# Patient Record
Sex: Male | Born: 1945
Health system: Southern US, Community
[De-identification: ages and names within clinical notes are randomized; demographics above are authoritative.]

## PROBLEM LIST (undated history)

## (undated) DIAGNOSIS — J449 Chronic obstructive pulmonary disease, unspecified: Secondary | ICD-10-CM

## (undated) DIAGNOSIS — I639 Cerebral infarction, unspecified: Secondary | ICD-10-CM

## (undated) DIAGNOSIS — E785 Hyperlipidemia, unspecified: Secondary | ICD-10-CM

## (undated) DIAGNOSIS — R319 Hematuria, unspecified: Secondary | ICD-10-CM

## (undated) HISTORY — DX: Cerebral infarction, unspecified: I63.9

## (undated) HISTORY — DX: Hematuria, unspecified: R31.9

## (undated) HISTORY — DX: Chronic obstructive pulmonary disease, unspecified: J44.9

## (undated) HISTORY — DX: Hyperlipidemia, unspecified: E78.5

---

## 2008-03-27 ENCOUNTER — Ambulatory Visit: Payer: Self-pay | Admitting: Internal Medicine

## 2009-11-17 HISTORY — PX: COLONOSCOPY: SHX174

## 2010-11-17 LAB — HM COLONOSCOPY: HM Colonoscopy: NORMAL

## 2014-06-14 LAB — CBC AND DIFFERENTIAL: Hemoglobin: 15.6 g/dL (ref 13.5–17.5)

## 2014-06-14 LAB — BASIC METABOLIC PANEL
BUN: 12 mg/dL (ref 4–21)
Creatinine: 1.1 mg/dL (ref <=1.3)
Glucose: 92 mg/dL

## 2014-06-14 LAB — HEPATIC FUNCTION PANEL
ALT: 15 U/L (ref 10–40)
AST: 16 U/L (ref 14–40)

## 2014-06-14 LAB — PSA: PSA: 1

## 2014-06-21 ENCOUNTER — Ambulatory Visit: Payer: Self-pay | Admitting: Family Medicine

## 2015-02-16 ENCOUNTER — Ambulatory Visit
Admit: 2015-02-16 | Disposition: A | Discharge status: Home / Self Care | Payer: Self-pay | Attending: Family Medicine | Admitting: Family Medicine

## 2015-04-03 DIAGNOSIS — I639 Cerebral infarction, unspecified: Secondary | ICD-10-CM | POA: Insufficient documentation

## 2015-04-03 DIAGNOSIS — J432 Centrilobular emphysema: Secondary | ICD-10-CM | POA: Insufficient documentation

## 2015-04-03 DIAGNOSIS — F1020 Alcohol dependence, uncomplicated: Secondary | ICD-10-CM | POA: Insufficient documentation

## 2015-04-03 DIAGNOSIS — Z91199 Patient's noncompliance with other medical treatment and regimen due to unspecified reason: Secondary | ICD-10-CM | POA: Insufficient documentation

## 2015-04-03 DIAGNOSIS — R911 Solitary pulmonary nodule: Secondary | ICD-10-CM | POA: Insufficient documentation

## 2015-04-03 DIAGNOSIS — D649 Anemia, unspecified: Secondary | ICD-10-CM | POA: Insufficient documentation

## 2015-04-03 DIAGNOSIS — R918 Other nonspecific abnormal finding of lung field: Secondary | ICD-10-CM | POA: Insufficient documentation

## 2015-04-03 DIAGNOSIS — E785 Hyperlipidemia, unspecified: Secondary | ICD-10-CM | POA: Insufficient documentation

## 2015-04-03 DIAGNOSIS — Z9119 Patient's noncompliance with other medical treatment and regimen: Secondary | ICD-10-CM | POA: Insufficient documentation

## 2015-04-03 DIAGNOSIS — R739 Hyperglycemia, unspecified: Secondary | ICD-10-CM | POA: Insufficient documentation

## 2015-07-18 ENCOUNTER — Encounter: Payer: Self-pay | Admitting: Family Medicine

## 2015-07-18 ENCOUNTER — Ambulatory Visit (INDEPENDENT_AMBULATORY_CARE_PROVIDER_SITE_OTHER): Payer: PPO | Admitting: Family Medicine

## 2015-07-18 VITALS — BP 120/80 | HR 70 | Ht 73.0 in | Wt 125.0 lb

## 2015-07-18 DIAGNOSIS — R5381 Other malaise: Secondary | ICD-10-CM

## 2015-07-18 DIAGNOSIS — F17210 Nicotine dependence, cigarettes, uncomplicated: Secondary | ICD-10-CM | POA: Diagnosis not present

## 2015-07-18 DIAGNOSIS — R634 Abnormal weight loss: Secondary | ICD-10-CM

## 2015-07-18 DIAGNOSIS — F1099 Alcohol use, unspecified with unspecified alcohol-induced disorder: Secondary | ICD-10-CM | POA: Diagnosis not present

## 2015-07-18 DIAGNOSIS — J439 Emphysema, unspecified: Secondary | ICD-10-CM | POA: Diagnosis not present

## 2015-07-18 DIAGNOSIS — R5383 Other fatigue: Secondary | ICD-10-CM

## 2015-07-18 DIAGNOSIS — R0609 Other forms of dyspnea: Secondary | ICD-10-CM | POA: Diagnosis not present

## 2015-07-18 DIAGNOSIS — R918 Other nonspecific abnormal finding of lung field: Secondary | ICD-10-CM

## 2015-07-18 DIAGNOSIS — Z7289 Other problems related to lifestyle: Secondary | ICD-10-CM

## 2015-07-18 DIAGNOSIS — F1090 Alcohol use, unspecified, uncomplicated: Secondary | ICD-10-CM

## 2015-07-18 DIAGNOSIS — Z789 Other specified health status: Secondary | ICD-10-CM

## 2015-07-18 LAB — HEMOCCULT GUIAC POC 1CARD (OFFICE): Fecal Occult Blood, POC: NEGATIVE

## 2015-07-18 NOTE — Progress Notes (Signed)
Name: Nathaniel Garcia   MRN: [id number]    DOB: 06-23-1946   Date:07/18/2015       Progress Note  Subjective  Chief Complaint  Chief Complaint  Patient presents with  . Fatigue    on exertion- carrying boxes, walking a "good distance"    Shortness of Breath This is a recurrent problem. The current episode started more than 1 month ago. The problem occurs daily. The problem has been unchanged. Pertinent negatives include no abdominal pain, chest pain, claudication, coryza, ear pain, fever, headaches, hemoptysis, leg pain, leg swelling, neck pain, orthopnea, PND, rash, rhinorrhea, sore throat, sputum production, swollen glands, syncope, vomiting or wheezing. The symptoms are aggravated by any activity (smoking). Associated symptoms comments: No dysphagia/. Risk factors include smoking. The treatment provided moderate relief. His past medical history is significant for chronic lung disease and COPD. There is no history of allergies, aspirin allergies, asthma, bronchiolitis, CAD, DVT, a heart failure, PE, pneumonia or a recent surgery.    No problem-specific assessment & plan notes found for this encounter.   Past Medical History  Diagnosis Date  . COPD (chronic obstructive pulmonary disease)     Past Surgical History  Procedure Laterality Date  . Colonoscopy  2011    normal- [facility]    History reviewed. No pertinent family history.  Social History   Social History  . Marital Status: Married    Spouse Name: N/A  . Number of Children: N/A  . Years of Education: N/A   Occupational History  . Not on file.   Social History Main Topics  . Smoking status: Current Every Day Smoker  . Smokeless tobacco: Not on file  . Alcohol Use: 0.0 oz/week    0 Standard drinks or equivalent per week  . Drug Use: Yes     Comment: marijuana  . Sexual Activity: No   Other Topics Concern  . Not on file   Social History Narrative    No Known Allergies   Review of Systems   Constitutional: Positive for weight loss and malaise/fatigue. Negative for fever, chills and diaphoresis.  HENT: Negative for congestion, ear discharge, ear pain, hearing loss, nosebleeds, rhinorrhea, sore throat and tinnitus.   Eyes: Negative for blurred vision, double vision, photophobia, pain, discharge and redness.  Respiratory: Positive for cough and shortness of breath. Negative for hemoptysis, sputum production, wheezing and stridor.   Cardiovascular: Negative for chest pain, orthopnea, claudication, leg swelling, syncope and PND.  Gastrointestinal: Negative for heartburn, nausea, vomiting, abdominal pain, diarrhea, constipation, blood in stool and melena.  Genitourinary: Negative for dysuria, urgency, frequency, hematuria and flank pain.  Musculoskeletal: Negative for myalgias, back pain, joint pain, falls and neck pain.  Skin: Negative for itching and rash.  Neurological: Negative for dizziness, tingling, tremors, focal weakness, weakness and headaches.  Endo/Heme/Allergies: Negative for polydipsia. Does not bruise/bleed easily.  Psychiatric/Behavioral: Negative for depression and substance abuse. The patient has insomnia.      Objective  Filed Vitals:   07/18/15 0938  BP: 120/80  Pulse: 70  Height: 6\' 1"  (1.854 m)  Weight: 125 lb (56.7 kg)    Physical Exam  Constitutional: He is oriented to person, place, and time and well-developed, well-nourished, and in no distress.  HENT:  Head: Normocephalic.  Right Ear: External ear normal.  Left Ear: External ear normal.  Nose: Nose normal.  Mouth/Throat: Oropharynx is clear and moist.  Eyes: Conjunctivae and EOM are normal. Pupils are equal, round, and reactive to light. Right  eye exhibits no discharge. Left eye exhibits no discharge. No scleral icterus.  Neck: Normal range of motion. Neck supple. No JVD present. No tracheal deviation present. No thyromegaly present.  Cardiovascular: Normal rate, regular rhythm, normal heart  sounds and intact distal pulses.  Exam reveals no gallop and no friction rub.   No murmur heard. Pulmonary/Chest: Breath sounds normal. No respiratory distress. He has no wheezes. He has no rales.  Abdominal: Soft. Bowel sounds are normal. He exhibits no mass. There is no hepatosplenomegaly. There is no tenderness. There is no rebound, no guarding and no CVA tenderness.  Genitourinary: Rectum normal and prostate normal. Guaiac negative stool.  Musculoskeletal: Normal range of motion. He exhibits no edema or tenderness.  Lymphadenopathy:    He has no cervical adenopathy.  Neurological: He is alert and oriented to person, place, and time. He has normal sensation, normal strength, normal reflexes and intact cranial nerves. No cranial nerve deficit.  Skin: Skin is warm. No rash noted.  Psychiatric: Mood and affect normal.      Assessment & Plan  Problem List Items Addressed This Visit      Other   Lung nodule, multiple   Relevant Orders   CT CHEST NODULE FOLLOW UP LOW DOSE W/O    Other Visit Diagnoses    Dyspnea on exertion    -  Primary    Relevant Orders    EKG 12-Lead (Completed)    Ambulatory referral to Pulmonology    Ambulatory referral to Cardiology    Chronic bullous emphysema        Relevant Orders    Ambulatory referral to Pulmonology    Weight loss, non-intentional        Relevant Orders    CT Abdomen Pelvis W Contrast    POCT Occult Blood Stool (Completed)    Malaise and fatigue        Relevant Orders    CT Abdomen Pelvis W Contrast    Cigarette nicotine dependence without complication        Alcohol use             Dr. Elizabeth Sauer [facility]  07/18/2015

## 2015-07-30 ENCOUNTER — Ambulatory Visit
Admission: RE | Admit: 2015-07-30 | Discharge: 2015-07-30 | Disposition: A | Discharge status: Home / Self Care | Payer: PPO | Source: Ambulatory Visit | Attending: Family Medicine | Admitting: Family Medicine

## 2015-07-30 ENCOUNTER — Other Ambulatory Visit
Admission: RE | Admit: 2015-07-30 | Discharge: 2015-07-30 | Disposition: A | Discharge status: Home / Self Care | Payer: PPO | Source: Ambulatory Visit | Attending: Family Medicine | Admitting: Family Medicine

## 2015-07-30 DIAGNOSIS — R918 Other nonspecific abnormal finding of lung field: Secondary | ICD-10-CM | POA: Insufficient documentation

## 2015-07-30 DIAGNOSIS — R5381 Other malaise: Secondary | ICD-10-CM | POA: Diagnosis present

## 2015-07-30 DIAGNOSIS — R5383 Other fatigue: Secondary | ICD-10-CM | POA: Insufficient documentation

## 2015-07-30 DIAGNOSIS — F17210 Nicotine dependence, cigarettes, uncomplicated: Secondary | ICD-10-CM | POA: Diagnosis not present

## 2015-07-30 DIAGNOSIS — R634 Abnormal weight loss: Secondary | ICD-10-CM | POA: Diagnosis present

## 2015-07-30 DIAGNOSIS — K5730 Diverticulosis of large intestine without perforation or abscess without bleeding: Secondary | ICD-10-CM | POA: Insufficient documentation

## 2015-07-30 DIAGNOSIS — I708 Atherosclerosis of other arteries: Secondary | ICD-10-CM | POA: Diagnosis not present

## 2015-07-30 DIAGNOSIS — R0681 Apnea, not elsewhere classified: Secondary | ICD-10-CM | POA: Insufficient documentation

## 2015-07-30 LAB — CREATININE, SERUM
Creatinine, Ser: 1.09 mg/dL (ref 0.61–1.24)
GFR calc Af Amer: >60 mL/min (ref >60)

## 2015-07-30 MED ORDER — IOHEXOL 300 MG/ML  SOLN
125.0000 mL | Freq: Once | INTRAMUSCULAR | Status: AC | PRN
  Administered 2015-07-30: 125 mL via INTRAVENOUS

## 2015-08-01 ENCOUNTER — Other Ambulatory Visit: Payer: Self-pay

## 2015-08-01 DIAGNOSIS — Z8673 Personal history of transient ischemic attack (TIA), and cerebral infarction without residual deficits: Secondary | ICD-10-CM | POA: Insufficient documentation

## 2015-08-01 DIAGNOSIS — R918 Other nonspecific abnormal finding of lung field: Secondary | ICD-10-CM

## 2015-08-01 DIAGNOSIS — R001 Bradycardia, unspecified: Secondary | ICD-10-CM | POA: Insufficient documentation

## 2015-08-01 DIAGNOSIS — R0609 Other forms of dyspnea: Principal | ICD-10-CM

## 2015-08-01 DIAGNOSIS — E782 Mixed hyperlipidemia: Secondary | ICD-10-CM | POA: Insufficient documentation

## 2015-08-03 ENCOUNTER — Encounter: Payer: Self-pay | Admitting: Family Medicine

## 2015-08-03 ENCOUNTER — Ambulatory Visit (INDEPENDENT_AMBULATORY_CARE_PROVIDER_SITE_OTHER): Payer: PPO | Admitting: Family Medicine

## 2015-08-03 VITALS — BP 130/80 | HR 60 | Ht 73.0 in | Wt 127.0 lb

## 2015-08-03 DIAGNOSIS — B356 Tinea cruris: Secondary | ICD-10-CM

## 2015-08-03 MED ORDER — CLOTRIMAZOLE-BETAMETHASONE 1-0.05 % EX CREA: 1.0000 "application " | TOPICAL_CREAM | Freq: Two times a day (BID) | CUTANEOUS | Status: DC

## 2015-08-03 NOTE — Progress Notes (Signed)
Name: Nathaniel Garcia   MRN: [id number]    DOB: 19-Jan-1946   Date:08/03/2015       Progress Note  Subjective  Chief Complaint  Chief Complaint  Patient presents with  . Rash    came up approx 2 weeks ago- started to itch    Rash This is a new problem. The current episode started 1 to 4 weeks ago. The problem is unchanged. The affected locations include the groin. The rash is characterized by itchiness and redness. He was exposed to nothing (denies contact with sexual partner). Pertinent negatives include no congestion, fever or sore throat. Past treatments include nothing.    No problem-specific assessment & plan notes found for this encounter.   Past Medical History  Diagnosis Date  . COPD (chronic obstructive pulmonary disease)     Past Surgical History  Procedure Laterality Date  . Colonoscopy  2011    normal- [facility]    History reviewed. No pertinent family history.  Social History   Social History  . Marital Status: Married    Spouse Name: N/A  . Number of Children: N/A  . Years of Education: N/A   Occupational History  . Not on file.   Social History Main Topics  . Smoking status: Former Smoker    Quit date: 07/19/2015  . Smokeless tobacco: Not on file  . Alcohol Use: 0.0 oz/week    0 Standard drinks or equivalent per week  . Drug Use: Yes     Comment: marijuana  . Sexual Activity: No   Other Topics Concern  . Not on file   Social History Narrative    No Known Allergies   Review of Systems  Constitutional: Negative for fever, chills and malaise/fatigue.  HENT: Negative for congestion and sore throat.   Musculoskeletal: Negative for myalgias.  Skin: Positive for rash.  Neurological: Negative for sensory change.  Endo/Heme/Allergies: Does not bruise/bleed easily.     Objective  Filed Vitals:   08/03/15 1554  BP: 130/80  Pulse: 60  Height:  (1.854 m)  Weight: 127 lb (57.607 kg)    Physical Exam  Constitutional: He is  well-developed, well-nourished, and in no distress.  HENT:  Head: Normocephalic.  Eyes: Pupils are equal, round, and reactive to light.  Skin: Skin is warm. Rash noted. There is erythema.  No lice noted/generalized erythema/excoriation      Assessment & Plan  Problem List Items Addressed This Visit    None    Visit Diagnoses    Tinea cruris    -  Primary    next step elimite    Relevant Medications    clotrimazole-betamethasone (LOTRISONE) cream         Dr. [facility]  08/03/2015

## 2015-08-07 ENCOUNTER — Other Ambulatory Visit: Payer: Self-pay | Admitting: Specialist

## 2015-08-07 DIAGNOSIS — R918 Other nonspecific abnormal finding of lung field: Secondary | ICD-10-CM

## 2015-08-15 DIAGNOSIS — I6523 Occlusion and stenosis of bilateral carotid arteries: Secondary | ICD-10-CM | POA: Insufficient documentation

## 2015-08-15 DIAGNOSIS — I6529 Occlusion and stenosis of unspecified carotid artery: Secondary | ICD-10-CM | POA: Insufficient documentation

## 2015-08-20 ENCOUNTER — Ambulatory Visit (INDEPENDENT_AMBULATORY_CARE_PROVIDER_SITE_OTHER): Payer: PPO | Admitting: Family Medicine

## 2015-08-20 ENCOUNTER — Encounter: Payer: Self-pay | Admitting: Family Medicine

## 2015-08-20 VITALS — BP 110/72 | HR 64 | Ht 73.0 in | Wt 133.0 lb

## 2015-08-20 DIAGNOSIS — J432 Centrilobular emphysema: Secondary | ICD-10-CM | POA: Diagnosis not present

## 2015-08-20 MED ORDER — ALBUTEROL SULFATE HFA 108 (90 BASE) MCG/ACT IN AERS: 2.0000 | INHALATION_SPRAY | Freq: Four times a day (QID) | RESPIRATORY_TRACT | Status: DC | PRN

## 2015-08-20 MED ORDER — TIOTROPIUM BROMIDE MONOHYDRATE 18 MCG IN CAPS: 18.0000 ug | ORAL_CAPSULE | RESPIRATORY_TRACT | Status: DC

## 2015-08-20 NOTE — Progress Notes (Signed)
Name: Nathaniel Garcia   MRN: [id number]    DOB: [birth year]   Date:08/20/2015       Progress Note  Subjective  Chief Complaint  Chief Complaint  Patient presents with  . COPD    SOB- lost Spiriva    Shortness of Breath This is a chronic problem. The current episode started more than 1 year ago. The problem occurs daily. The problem has been gradually worsening. Associated symptoms include wheezing. Pertinent negatives include no abdominal pain, chest pain, coryza, ear pain, fever, headaches, leg swelling, neck pain, rash, rhinorrhea, sore throat or sputum production. The symptoms are aggravated by weather changes. He has tried leukotriene antagonists for the symptoms. The treatment provided no relief. His past medical history is significant for chronic lung disease and COPD. There is no history of a heart failure or PE.    No problem-specific assessment & plan notes found for this encounter.   Past Medical History  Diagnosis Date  . COPD (chronic obstructive pulmonary disease) [facility])     Past Surgical History  Procedure Laterality Date  . Colonoscopy  2011    normal- [facility]    History reviewed. No pertinent family history.  Social History   Social History  . Marital Status: Married    Spouse Name: N/A  . Number of Children: N/A  . Years of Education: N/A   Occupational History  . Not on file.   Social History Main Topics  . Smoking status: Former Smoker    Quit date: 07/19/2015  . Smokeless tobacco: Not on file  . Alcohol Use: 0.0 oz/week    0 Standard drinks or equivalent per week  . Drug Use: Yes     Comment: marijuana  . Sexual Activity: No   Other Topics Concern  . Not on file   Social History Narrative    No Known Allergies   Review of Systems  Constitutional: Negative for fever, chills, weight loss and malaise/fatigue.  HENT: Negative for ear discharge, ear pain, rhinorrhea and sore throat.   Eyes: Negative for blurred vision.  Respiratory:  Positive for shortness of breath and wheezing. Negative for cough and sputum production.   Cardiovascular: Negative for chest pain, palpitations and leg swelling.  Gastrointestinal: Negative for heartburn, nausea, abdominal pain, diarrhea, constipation, blood in stool and melena.  Genitourinary: Negative for dysuria, urgency, frequency and hematuria.  Musculoskeletal: Negative for myalgias, back pain, joint pain and neck pain.  Skin: Negative for rash.  Neurological: Negative for dizziness, tingling, sensory change, focal weakness and headaches.  Endo/Heme/Allergies: Negative for environmental allergies and polydipsia. Does not bruise/bleed easily.  Psychiatric/Behavioral: Negative for depression and suicidal ideas. The patient is not nervous/anxious and does not have insomnia.      Objective  Filed Vitals:   08/20/15 1517  BP: 110/72  Pulse: 64  Height:  (1.854 m)  Weight: 133 lb (60.328 kg)  SpO2: 99%    Physical Exam  Constitutional: He is oriented to person, place, and time and well-developed, well-nourished, and in no distress.  HENT:  Head: Normocephalic.  Right Ear: External ear normal.  Left Ear: External ear normal.  Nose: Nose normal.  Mouth/Throat: Oropharynx is clear and moist.  Eyes: Conjunctivae and EOM are normal. Pupils are equal, round, and reactive to light. Right eye exhibits no discharge. Left eye exhibits no discharge. No scleral icterus.  Neck: Normal range of motion. Neck supple. No JVD present. No tracheal deviation present. No thyromegaly present.  Cardiovascular: Normal rate,  regular rhythm, normal heart sounds and intact distal pulses.  Exam reveals no gallop and no friction rub.   No murmur heard. Pulmonary/Chest: Breath sounds normal. No respiratory distress. He has no wheezes. He has no rales.  Abdominal: Soft. Bowel sounds are normal. He exhibits no mass. There is no hepatosplenomegaly. There is no tenderness. There is no rebound, no guarding  and no CVA tenderness.  Musculoskeletal: Normal range of motion. He exhibits no edema or tenderness.  Lymphadenopathy:    He has no cervical adenopathy.  Neurological: He is alert and oriented to person, place, and time. He has normal sensation, normal strength, normal reflexes and intact cranial nerves. No cranial nerve deficit.  Skin: Skin is warm. No rash noted.  Psychiatric: Mood and affect normal.  Nursing note and vitals reviewed.     Assessment & Plan  Problem List Items Addressed This Visit      Respiratory   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HCC) - Primary   Relevant Medications   albuterol (PROVENTIL HFA;VENTOLIN HFA) 108 (90 BASE) MCG/ACT inhaler   tiotropium (SPIRIVA HANDIHALER) 18 MCG inhalation capsule        Dr. [facility]  08/20/2015

## 2015-08-20 NOTE — Patient Instructions (Signed)

## 2015-09-11 ENCOUNTER — Other Ambulatory Visit: Payer: Self-pay

## 2015-09-14 ENCOUNTER — Ambulatory Visit (INDEPENDENT_AMBULATORY_CARE_PROVIDER_SITE_OTHER): Payer: PPO | Admitting: Family Medicine

## 2015-09-14 ENCOUNTER — Encounter: Payer: Self-pay | Admitting: Family Medicine

## 2015-09-14 VITALS — BP 98/62 | HR 68 | Ht 73.0 in | Wt 134.0 lb

## 2015-09-14 DIAGNOSIS — E785 Hyperlipidemia, unspecified: Secondary | ICD-10-CM

## 2015-09-14 DIAGNOSIS — I679 Cerebrovascular disease, unspecified: Secondary | ICD-10-CM

## 2015-09-14 DIAGNOSIS — J432 Centrilobular emphysema: Secondary | ICD-10-CM

## 2015-09-14 MED ORDER — ALBUTEROL SULFATE HFA 108 (90 BASE) MCG/ACT IN AERS: 2.0000 | INHALATION_SPRAY | Freq: Four times a day (QID) | RESPIRATORY_TRACT | Status: DC | PRN

## 2015-09-14 MED ORDER — PRAVASTATIN SODIUM 20 MG PO TABS: 20.0000 mg | ORAL_TABLET | Freq: Every day | ORAL | Status: DC

## 2015-09-14 MED ORDER — CLOPIDOGREL BISULFATE 75 MG PO TABS: 75.0000 mg | ORAL_TABLET | Freq: Every day | ORAL | Status: DC

## 2015-09-14 MED ORDER — TIOTROPIUM BROMIDE MONOHYDRATE 18 MCG IN CAPS: 18.0000 ug | ORAL_CAPSULE | RESPIRATORY_TRACT | Status: DC

## 2015-09-14 NOTE — Patient Instructions (Signed)
Chronic Obstructive Pulmonary Disease Chronic obstructive pulmonary disease (COPD) is a common lung condition in which airflow from the lungs is limited. COPD is a general term that can be used to describe many different lung problems that limit airflow, including both chronic bronchitis and emphysema. If you have COPD, your lung function will probably never return to normal, but there are measures you can take to improve lung function and make yourself feel better. CAUSES   Smoking (common).  Exposure to secondhand smoke.  Genetic problems.  Chronic inflammatory lung diseases or recurrent infections. SYMPTOMS  Shortness of breath, especially with physical activity.  Deep, persistent (chronic) cough with a large amount of thick mucus.  Wheezing.  Rapid breaths (tachypnea).  Gray or bluish discoloration (cyanosis) of the skin, especially in your fingers, toes, or lips.  Fatigue.  Weight loss.  Frequent infections or episodes when breathing symptoms become much worse (exacerbations).  Chest tightness. DIAGNOSIS Your health care provider will take a medical history and perform a physical examination to diagnose COPD. Additional tests for COPD may include:  Lung (pulmonary) function tests.  Chest X-ray.  CT scan.  Blood tests. TREATMENT  Treatment for COPD may include:  Inhaler and nebulizer medicines. These help manage the symptoms of COPD and make your breathing more comfortable.  Supplemental oxygen. Supplemental oxygen is only helpful if you have a low oxygen level in your blood.  Exercise and physical activity. These are beneficial for nearly all people with COPD.  Lung surgery or transplant.  Nutrition therapy to gain weight, if you are underweight.  Pulmonary rehabilitation. This may involve working with a team of health care providers and specialists, such as respiratory, occupational, and physical therapists. HOME CARE INSTRUCTIONS  Take all medicines  (inhaled or pills) as directed by your health care provider.  Avoid over-the-counter medicines or cough syrups that dry up your airway (such as antihistamines) and slow down the elimination of secretions unless instructed otherwise by your health care provider.  If you are a smoker, the most important thing that you can do is stop smoking. Continuing to smoke will cause further lung damage and breathing trouble. Ask your health care provider for help with quitting smoking. He or she can direct you to community resources or hospitals that provide support.  Avoid exposure to irritants such as smoke, chemicals, and fumes that aggravate your breathing.  Use oxygen therapy and pulmonary rehabilitation if directed by your health care provider. If you require home oxygen therapy, ask your health care provider whether you should purchase a pulse oximeter to measure your oxygen level at home.  Avoid contact with individuals who have a contagious illness.  Avoid extreme temperature and humidity changes.  Eat healthy foods. Eating smaller, more frequent meals and resting before meals may help you maintain your strength.  Stay active, but balance activity with periods of rest. Exercise and physical activity will help you maintain your ability to do things you want to do.  Preventing infection and hospitalization is very important when you have COPD. Make sure to receive all the vaccines your health care provider recommends, especially the pneumococcal and influenza vaccines. Ask your health care provider whether you need a pneumonia vaccine.  Learn and use relaxation techniques to manage stress.  Learn and use controlled breathing techniques as directed by your health care provider. Controlled breathing techniques include:  Pursed lip breathing. Start by breathing in (inhaling) through your nose for 1 second. Then, purse your lips as if you were   going to whistle and breathe out (exhale) through the  pursed lips for 2 seconds.  Diaphragmatic breathing. Start by putting one hand on your abdomen just above your waist. Inhale slowly through your nose. The hand on your abdomen should move out. Then purse your lips and exhale slowly. You should be able to feel the hand on your abdomen moving in as you exhale.  Learn and use controlled coughing to clear mucus from your lungs. Controlled coughing is a series of short, progressive coughs. The steps of controlled coughing are: 1. Lean your head slightly forward. 2. Breathe in deeply using diaphragmatic breathing. 3. Try to hold your breath for 3 seconds. 4. Keep your mouth slightly open while coughing twice. 5. Spit any mucus out into a tissue. 6. Rest and repeat the steps once or twice as needed. SEEK MEDICAL CARE IF:  You are coughing up more mucus than usual.  There is a change in the color or thickness of your mucus.  Your breathing is more labored than usual.  Your breathing is faster than usual. SEEK IMMEDIATE MEDICAL CARE IF:  You have shortness of breath while you are resting.  You have shortness of breath that prevents you from:  Being able to talk.  Performing your usual physical activities.  You have chest pain lasting longer than 5 minutes.  Your skin color is more cyanotic than usual.  You measure low oxygen saturations for longer than 5 minutes with a pulse oximeter. MAKE SURE YOU:  Understand these instructions.  Will watch your condition.  Will get help right away if you are not doing well or get worse.   This information is not intended to replace advice given to you by your health care provider. Make sure you discuss any questions you have with your health care provider.   Document Released: 08/13/2005 Document Revised: 11/24/2014 Document Reviewed: 06/30/2013 [employer] Interactive Patient Education 2016 [employer]  

## 2015-09-14 NOTE — Progress Notes (Signed)
Name: Nathaniel Garcia   MRN: [id number]    DOB: 03/01/1946   Date:09/14/2015       Progress Note  Subjective  Chief Complaint  Chief Complaint  Patient presents with  . Hypertension  . COPD    Shortness of Breath This is a chronic problem. The current episode started more than 1 year ago. The problem occurs daily. The problem has been gradually improving. Pertinent negatives include no abdominal pain, chest pain, claudication, coryza, ear pain, fever, headaches, hemoptysis, leg pain, leg swelling, neck pain, orthopnea, PND, rash, rhinorrhea, sore throat, sputum production, swollen glands, syncope, vomiting or wheezing. The symptoms are aggravated by weather changes. Risk factors include smoking. He has tried beta agonist inhalers and ipratropium inhalers for the symptoms. The treatment provided moderate relief. His past medical history is significant for allergies and COPD. There is no history of aspirin allergies, asthma, bronchiolitis, CAD, chronic lung disease, DVT, a heart failure, PE, pneumonia or a recent surgery.  Hyperlipidemia This is a chronic problem. The current episode started more than 1 year ago. The problem is controlled. Recent lipid tests were reviewed and are variable. He has no history of chronic renal disease, diabetes, hypothyroidism, liver disease, obesity or nephrotic syndrome. There are no known factors aggravating his hyperlipidemia. Associated symptoms include shortness of breath. Pertinent negatives include no chest pain, focal sensory loss, focal weakness, leg pain or myalgias. Current antihyperlipidemic treatment includes statins. The current treatment provides moderate improvement of lipids. There are no compliance problems.  Risk factors for coronary artery disease include dyslipidemia and male sex.  Neurologic Problem The patient's pertinent negatives include no altered mental status, clumsiness, focal sensory loss, focal weakness, loss of balance, memory  loss, near-syncope, slurred speech, syncope or visual change. Primary symptoms comment: hx of cva. This is a chronic problem. The current episode started more than 1 year ago. The problem is unchanged. There was no focality noted. Associated symptoms include shortness of breath. Pertinent negatives include no abdominal pain, back pain, chest pain, dizziness, fever, headaches, nausea, neck pain, palpitations or vomiting. Past treatments include aspirin. The treatment provided mild relief. There is no history of a bleeding disorder, a clotting disorder or liver disease.    No problem-specific assessment & plan notes found for this encounter.   Past Medical History  Diagnosis Date  . COPD (chronic obstructive pulmonary disease) [facility])     Past Surgical History  Procedure Laterality Date  . Colonoscopy  2011    normal- [facility]    History reviewed. No pertinent family history.  Social History   Social History  . Marital Status: Married    Spouse Name: N/A  . Number of Children: N/A  . Years of Education: N/A   Occupational History  . Not on file.   Social History Main Topics  . Smoking status: Former Smoker    Quit date: 07/19/2015  . Smokeless tobacco: Not on file  . Alcohol Use: 0.0 oz/week    0 Standard drinks or equivalent per week  . Drug Use: Yes     Comment: marijuana  . Sexual Activity: No   Other Topics Concern  . Not on file   Social History Narrative    No Known Allergies   Review of Systems  Constitutional: Negative for fever, chills, weight loss and malaise/fatigue.  HENT: Negative for ear discharge, ear pain, rhinorrhea and sore throat.   Eyes: Negative for blurred vision.  Respiratory: Positive for shortness of breath. Negative for cough,  hemoptysis, sputum production and wheezing.   Cardiovascular: Negative for chest pain, palpitations, orthopnea, claudication, leg swelling, syncope, PND and near-syncope.  Gastrointestinal: Negative for heartburn,  nausea, vomiting, abdominal pain, diarrhea, constipation, blood in stool and melena.  Genitourinary: Negative for dysuria, urgency, frequency and hematuria.  Musculoskeletal: Negative for myalgias, back pain, joint pain and neck pain.  Skin: Negative for rash.  Neurological: Negative for dizziness, tingling, sensory change, focal weakness, syncope, headaches and loss of balance.  Endo/Heme/Allergies: Negative for environmental allergies and polydipsia. Does not bruise/bleed easily.  Psychiatric/Behavioral: Negative for depression, suicidal ideas and memory loss. The patient is not nervous/anxious and does not have insomnia.      Objective  Filed Vitals:   09/14/15 0818  BP: 98/62  Pulse: 68  Height: 6\' 1"  (1.854 m)  Weight: 134 lb (60.782 kg)    Physical Exam  Constitutional: He is oriented to person, place, and time and well-developed, well-nourished, and in no distress.  HENT:  Head: Normocephalic.  Right Ear: External ear normal.  Left Ear: External ear normal.  Nose: Nose normal.  Mouth/Throat: Oropharynx is clear and moist.  Eyes: Conjunctivae and EOM are normal. Pupils are equal, round, and reactive to light. Right eye exhibits no discharge. Left eye exhibits no discharge. No scleral icterus.  Neck: Normal range of motion. Neck supple. No JVD present. No tracheal deviation present. No thyromegaly present.  Cardiovascular: Normal rate, regular rhythm, normal heart sounds and intact distal pulses.  Exam reveals no gallop and no friction rub.   No murmur heard. Pulmonary/Chest: Breath sounds normal. No respiratory distress. He has no wheezes. He has no rales.  Abdominal: Soft. Bowel sounds are normal. He exhibits no mass. There is no hepatosplenomegaly. There is no tenderness. There is no rebound, no guarding and no CVA tenderness.  Musculoskeletal: Normal range of motion. He exhibits no edema or tenderness.  Lymphadenopathy:    He has no cervical adenopathy.  Neurological: He  is alert and oriented to person, place, and time. He has normal sensation, normal strength and intact cranial nerves. No cranial nerve deficit.  Skin: Skin is warm. No rash noted.  Psychiatric: Mood and affect normal.      Assessment & Plan  Problem List Items Addressed This Visit      Cardiovascular and Mediastinum   Cerebral vascular disease   Relevant Medications   clopidogrel (PLAVIX) 75 MG tablet   pravastatin (PRAVACHOL) 20 MG tablet     Respiratory   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HCC) - Primary   Relevant Medications   tiotropium (SPIRIVA HANDIHALER) 18 MCG inhalation capsule   albuterol (PROVENTIL HFA;VENTOLIN HFA) 108 (90 BASE) MCG/ACT inhaler     Other   HLD (hyperlipidemia)   Relevant Medications   pravastatin (PRAVACHOL) 20 MG tablet   Other Relevant Orders   Lipid Profile        Dr. [facility] Sauereanna Latroy [facility]  09/14/2015

## 2015-09-18 ENCOUNTER — Ambulatory Visit (INDEPENDENT_AMBULATORY_CARE_PROVIDER_SITE_OTHER): Payer: PPO | Admitting: Family Medicine

## 2015-09-18 ENCOUNTER — Encounter: Payer: Self-pay | Admitting: Family Medicine

## 2015-09-18 VITALS — BP 110/60 | HR 62 | Ht 73.0 in | Wt 131.0 lb

## 2015-09-18 DIAGNOSIS — H6123 Impacted cerumen, bilateral: Secondary | ICD-10-CM | POA: Diagnosis not present

## 2015-09-18 NOTE — Progress Notes (Signed)
Name: Nathaniel Garcia   MRN: [id number]    DOB: 10/01/1946   Date:09/18/2015       Progress Note  Subjective  Chief Complaint  Chief Complaint  Patient presents with  . Ear Fullness    Ear Fullness  There is pain in both ears. The current episode started in the past 7 days. The problem occurs constantly. The problem has been waxing and waning. Pertinent negatives include no abdominal pain, coughing, diarrhea, ear discharge, headaches, hearing loss, neck pain, rash, rhinorrhea, sore throat or vomiting. The treatment provided no relief.    No problem-specific assessment & plan notes found for this encounter.   Past Medical History  Diagnosis Date  . COPD (chronic obstructive pulmonary disease) [facility](HCC)     Past Surgical History  Procedure Laterality Date  . Colonoscopy  2011    normal- [facility]    No family history on file.  Social History   Social History  . Marital Status: Married    Spouse Name: N/A  . Number of Children: N/A  . Years of Education: N/A   Occupational History  . Not on file.   Social History Main Topics  . Smoking status: Former Smoker    Quit date: 07/19/2015  . Smokeless tobacco: Not on file  . Alcohol Use: 0.0 oz/week    0 Standard drinks or equivalent per week  . Drug Use: Yes     Comment: marijuana  . Sexual Activity: No   Other Topics Concern  . Not on file   Social History Narrative    No Known Allergies   Review of Systems  Constitutional: Negative for fever, chills, weight loss and malaise/fatigue.  HENT: Negative for ear discharge, ear pain, hearing loss, rhinorrhea and sore throat.   Eyes: Negative for blurred vision.  Respiratory: Negative for cough, sputum production, shortness of breath and wheezing.   Cardiovascular: Negative for chest pain, palpitations and leg swelling.  Gastrointestinal: Negative for heartburn, nausea, vomiting, abdominal pain, diarrhea, constipation, blood in stool and melena.  Genitourinary:  Negative for dysuria, urgency, frequency and hematuria.  Musculoskeletal: Negative for myalgias, back pain, joint pain and neck pain.  Skin: Negative for rash.  Neurological: Negative for dizziness, tingling, sensory change, focal weakness and headaches.  Endo/Heme/Allergies: Negative for environmental allergies and polydipsia. Does not bruise/bleed easily.  Psychiatric/Behavioral: Negative for depression and suicidal ideas. The patient is not nervous/anxious and does not have insomnia.      Objective  Filed Vitals:   09/18/15 1336  BP: 110/60  Pulse: 62  Height: 6\' 1"  (1.854 m)  Weight: 131 lb (59.421 kg)    Physical Exam  Constitutional: He is well-developed, well-nourished, and in no distress.  HENT:  Right Ear: Tympanic membrane normal. A foreign body is present. No decreased hearing is noted.  Left Ear: Tympanic membrane normal. A foreign body is present. No decreased hearing is noted.  Cerumen impactction  Eyes: Pupils are equal, round, and reactive to light.  Neck: Neck supple.  Cardiovascular: Normal rate and normal heart sounds.  Exam reveals no friction rub.   No murmur heard. Pulmonary/Chest: Effort normal and breath sounds normal.  Skin: No erythema.  Nursing note and vitals reviewed.     Assessment & Plan  Problem List Items Addressed This Visit    None    Visit Diagnoses    Cerumen impaction, bilateral    -  Primary    irragation         Dr. Jennette Kettleeanna  [facility]  09/18/2015

## 2015-12-10 ENCOUNTER — Other Ambulatory Visit: Payer: Self-pay

## 2016-01-03 ENCOUNTER — Encounter: Payer: Self-pay | Admitting: Family Medicine

## 2016-01-03 ENCOUNTER — Ambulatory Visit (INDEPENDENT_AMBULATORY_CARE_PROVIDER_SITE_OTHER): Payer: PPO | Admitting: Family Medicine

## 2016-01-03 VITALS — BP 140/80 | HR 80 | Temp 99.0°F | Ht 73.0 in | Wt 134.0 lb

## 2016-01-03 DIAGNOSIS — J40 Bronchitis, not specified as acute or chronic: Secondary | ICD-10-CM | POA: Diagnosis not present

## 2016-01-03 DIAGNOSIS — J439 Emphysema, unspecified: Secondary | ICD-10-CM | POA: Diagnosis not present

## 2016-01-03 DIAGNOSIS — I679 Cerebrovascular disease, unspecified: Secondary | ICD-10-CM

## 2016-01-03 DIAGNOSIS — J432 Centrilobular emphysema: Secondary | ICD-10-CM | POA: Diagnosis not present

## 2016-01-03 DIAGNOSIS — I639 Cerebral infarction, unspecified: Secondary | ICD-10-CM

## 2016-01-03 DIAGNOSIS — E785 Hyperlipidemia, unspecified: Secondary | ICD-10-CM

## 2016-01-03 DIAGNOSIS — Z13220 Encounter for screening for lipoid disorders: Secondary | ICD-10-CM | POA: Insufficient documentation

## 2016-01-03 LAB — POCT INFLUENZA A/B
Influenza A, POC: NEGATIVE
Influenza B, POC: NEGATIVE

## 2016-01-03 MED ORDER — CLOPIDOGREL BISULFATE 75 MG PO TABS: 75.0000 mg | ORAL_TABLET | Freq: Every day | ORAL | Status: DC

## 2016-01-03 MED ORDER — AMOXICILLIN 500 MG PO CAPS: 500.0000 mg | ORAL_CAPSULE | Freq: Three times a day (TID) | ORAL | Status: DC

## 2016-01-03 MED ORDER — TIOTROPIUM BROMIDE MONOHYDRATE 18 MCG IN CAPS: 18.0000 ug | ORAL_CAPSULE | RESPIRATORY_TRACT | Status: DC

## 2016-01-03 MED ORDER — PRAVASTATIN SODIUM 20 MG PO TABS: 20.0000 mg | ORAL_TABLET | Freq: Every day | ORAL | Status: DC

## 2016-01-03 MED ORDER — ALBUTEROL SULFATE HFA 108 (90 BASE) MCG/ACT IN AERS: 2.0000 | INHALATION_SPRAY | Freq: Four times a day (QID) | RESPIRATORY_TRACT | Status: DC | PRN

## 2016-01-03 NOTE — Progress Notes (Signed)
Name: Nathaniel Garcia   MRN: [id number]    DOB: 06/04/1946   Date:01/03/2016       Progress Note  Subjective  Chief Complaint  Chief Complaint  Patient presents with  . Hyperlipidemia  . Cerebrovascular Accident    taking Plavix   . COPD  . Sinusitis    cough- yellow production and cong    Hyperlipidemia This is a chronic problem. The current episode started more than 1 year ago. The problem is controlled. Recent lipid tests were reviewed and are normal. He has no history of chronic renal disease, diabetes, hypothyroidism, liver disease, obesity or nephrotic syndrome. There are no known factors aggravating his hyperlipidemia. Pertinent negatives include no chest pain, focal sensory loss, focal weakness, leg pain, myalgias or shortness of breath. Current antihyperlipidemic treatment includes statins. The current treatment provides mild improvement of lipids. There are no compliance problems.  There are no known risk factors for coronary artery disease.  Cerebrovascular Accident This is a chronic problem. The current episode started more than 1 year ago. The problem has been unchanged. Associated symptoms include congestion and coughing. Pertinent negatives include no abdominal pain, chest pain, chills, diaphoresis, fever, headaches, myalgias, nausea, neck pain, numbness, rash, sore throat, visual change or weakness. Nothing aggravates the symptoms. He has tried nothing for the symptoms. The treatment provided moderate relief.  Sinusitis This is a chronic problem. The current episode started more than 1 year ago. The problem has been waxing and waning since onset. There has been no fever. Associated symptoms include congestion and coughing. Pertinent negatives include no chills, diaphoresis, ear pain, headaches, neck pain, shortness of breath, sinus pressure or sore throat. Past treatments include acetaminophen. The treatment provided mild relief.  Cough This is a chronic problem. The  current episode started more than 1 year ago. The problem has been waxing and waning. The cough is non-productive. Pertinent negatives include no chest pain, chills, ear pain, fever, headaches, heartburn, myalgias, postnasal drip, rash, sore throat, shortness of breath, sweats, weight loss or wheezing. Nothing aggravates the symptoms. The treatment provided mild relief. His past medical history is significant for COPD. There is no history of environmental allergies or pneumonia.  Shortness of Breath This is a chronic problem. The current episode started more than 1 year ago. Pertinent negatives include no abdominal pain, chest pain, ear pain, fever, headaches, leg pain, leg swelling, neck pain, rash, sore throat, sputum production or wheezing. The symptoms are aggravated by URIs. He has tried beta agonist inhalers and ipratropium inhalers for the symptoms. The treatment provided mild relief. His past medical history is significant for COPD. There is no history of allergies or pneumonia.    No problem-specific assessment & plan notes found for this encounter.   Past Medical History  Diagnosis Date  . COPD (chronic obstructive pulmonary disease) [facility])     Past Surgical History  Procedure Laterality Date  . Colonoscopy  2011    normal- [facility] docs    History reviewed. No pertinent family history.  Social History   Social History  . Marital Status: Married    Spouse Name: N/A  . Number of Children: N/A  . Years of Education: N/A   Occupational History  . Not on file.   Social History Main Topics  . Smoking status: Former Smoker    Quit date: 07/19/2015  . Smokeless tobacco: Not on file  . Alcohol Use: 0.0 oz/week    0 Standard drinks or equivalent per week  .  Drug Use: Yes     Comment: marijuana  . Sexual Activity: No   Other Topics Concern  . Not on file   Social History Narrative    No Known Allergies   Review of Systems  Constitutional: Negative for fever, chills,  weight loss, malaise/fatigue and diaphoresis.  HENT: Positive for congestion. Negative for ear discharge, ear pain, postnasal drip, sinus pressure and sore throat.   Eyes: Negative for blurred vision.  Respiratory: Positive for cough. Negative for sputum production, shortness of breath and wheezing.   Cardiovascular: Negative for chest pain, palpitations and leg swelling.  Gastrointestinal: Negative for heartburn, nausea, abdominal pain, diarrhea, constipation, blood in stool and melena.  Genitourinary: Negative for dysuria, urgency, frequency and hematuria.  Musculoskeletal: Negative for myalgias, back pain, joint pain and neck pain.  Skin: Negative for rash.  Neurological: Negative for dizziness, tingling, sensory change, focal weakness, weakness, numbness and headaches.  Endo/Heme/Allergies: Negative for environmental allergies and polydipsia. Does not bruise/bleed easily.  Psychiatric/Behavioral: Negative for depression and suicidal ideas. The patient is not nervous/anxious and does not have insomnia.      Objective  Filed Vitals:   01/03/16 1547  BP: 140/80  Pulse: 80  Temp: 99 F (37.2 C)  TempSrc: Oral  Height: 6\' 1"  (1.854 m)  Weight: 134 lb (60.782 kg)    Physical Exam  Constitutional: He is oriented to person, place, and time and well-developed, well-nourished, and in no distress.  HENT:  Head: Normocephalic.  Right Ear: External ear normal.  Left Ear: External ear normal.  Nose: Nose normal.  Mouth/Throat: Oropharynx is clear and moist.  Eyes: Conjunctivae and EOM are normal. Pupils are equal, round, and reactive to light. Right eye exhibits no discharge. Left eye exhibits no discharge. No scleral icterus.  Neck: Normal range of motion. Neck supple. No JVD present. No tracheal deviation present. No thyromegaly present.  Cardiovascular: Normal rate, regular rhythm, normal heart sounds and intact distal pulses.  Exam reveals no gallop and no friction rub.   No murmur  heard. Pulmonary/Chest: Breath sounds normal. No respiratory distress. He has no wheezes. He has no rales.  Abdominal: Soft. Bowel sounds are normal. He exhibits no mass. There is no hepatosplenomegaly. There is no tenderness. There is no rebound, no guarding and no CVA tenderness.  Musculoskeletal: Normal range of motion. He exhibits no edema or tenderness.  Lymphadenopathy:    He has no cervical adenopathy.  Neurological: He is alert and oriented to person, place, and time. He has normal sensation, normal strength, normal reflexes and intact cranial nerves. No cranial nerve deficit.  Skin: Skin is warm. No rash noted.  Psychiatric: Mood and affect normal.  Nursing note and vitals reviewed.     Assessment & Plan  Problem List Items Addressed This Visit      Cardiovascular and Mediastinum   Cerebral vascular accident (HCC)   Relevant Medications   pravastatin (PRAVACHOL) 20 MG tablet   Cerebral vascular disease   Relevant Medications   clopidogrel (PLAVIX) 75 MG tablet   pravastatin (PRAVACHOL) 20 MG tablet     Respiratory   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HCC)   Relevant Medications   albuterol (PROVENTIL HFA;VENTOLIN HFA) 108 (90 Base) MCG/ACT inhaler   tiotropium (SPIRIVA HANDIHALER) 18 MCG inhalation capsule     Other   HLD (hyperlipidemia)   Relevant Medications   pravastatin (PRAVACHOL) 20 MG tablet   Lipid screening    Other Visit Diagnoses    Bronchitis    -  Primary    Relevant Medications    amoxicillin (AMOXIL) 500 MG capsule    Other Relevant Orders    POCT Influenza A/B (Completed)    Pulmonary emphysema, unspecified emphysema type (HCC)        Relevant Medications    albuterol (PROVENTIL HFA;VENTOLIN HFA) 108 (90 Base) MCG/ACT inhaler    tiotropium (SPIRIVA HANDIHALER) 18 MCG inhalation capsule    Hyperlipidemia        Relevant Medications    pravastatin (PRAVACHOL) 20 MG tablet    Other Relevant Orders    Lipid Profile         Dr. Elizabeth Sauer [facility]  01/03/2016

## 2016-02-04 ENCOUNTER — Ambulatory Visit: Admission: RE | Admit: 2016-02-04 | Payer: PPO | Source: Ambulatory Visit

## 2016-02-12 ENCOUNTER — Ambulatory Visit
Admission: RE | Admit: 2016-02-12 | Discharge: 2016-02-12 | Disposition: A | Discharge status: Home / Self Care | Payer: PPO | Source: Ambulatory Visit | Attending: Family Medicine | Admitting: Family Medicine

## 2016-02-12 ENCOUNTER — Ambulatory Visit (INDEPENDENT_AMBULATORY_CARE_PROVIDER_SITE_OTHER): Payer: PPO | Admitting: Family Medicine

## 2016-02-12 ENCOUNTER — Encounter: Payer: Self-pay | Admitting: Family Medicine

## 2016-02-12 VITALS — BP 130/56 | HR 64 | Ht 73.0 in | Wt 139.0 lb

## 2016-02-12 DIAGNOSIS — M25552 Pain in left hip: Secondary | ICD-10-CM

## 2016-02-12 DIAGNOSIS — B354 Tinea corporis: Secondary | ICD-10-CM

## 2016-02-12 DIAGNOSIS — L739 Follicular disorder, unspecified: Secondary | ICD-10-CM | POA: Diagnosis not present

## 2016-02-12 MED ORDER — CLOTRIMAZOLE-BETAMETHASONE 1-0.05 % EX CREA: 1.0000 "application " | TOPICAL_CREAM | Freq: Two times a day (BID) | CUTANEOUS | Status: DC

## 2016-02-12 MED ORDER — IBUPROFEN 800 MG PO TABS: 800.0000 mg | ORAL_TABLET | Freq: Three times a day (TID) | ORAL | Status: DC | PRN

## 2016-02-12 MED ORDER — CEPHALEXIN 500 MG PO CAPS: 500.0000 mg | ORAL_CAPSULE | Freq: Three times a day (TID) | ORAL | Status: DC

## 2016-02-12 NOTE — Progress Notes (Signed)
Name: Nathaniel Garcia   MRN: [id number]    DOB: 1946/04/11   Date:02/12/2016       Progress Note  Subjective  Chief Complaint  Chief Complaint  Patient presents with  . Flank Pain    L) side x 1 week- hurts worse after lying down or sitting for long periods of time.  . Rash    rash under both arms and inn "creases of both legs"- was given some med for this in the past    Rash This is a new problem. The current episode started in the past 7 days. The affected locations include the right axilla and left axilla. The rash is characterized by itchiness, redness and swelling. He was exposed to nothing. Pertinent negatives include no cough, diarrhea, fever, joint pain, shortness of breath or sore throat. The treatment provided no relief.  Hip Pain  Incident onset: onset 2 wks ago. There was no injury mechanism. The pain is present in the left hip (primarily iliac crest). The quality of the pain is described as aching. The pain has been fluctuating since onset. Associated symptoms include a loss of motion. Pertinent negatives include no inability to bear weight, muscle weakness or tingling. The symptoms are aggravated by movement and weight bearing. He has tried NSAIDs for the symptoms. The treatment provided mild relief.    No problem-specific assessment & plan notes found for this encounter.   Past Medical History  Diagnosis Date  . COPD (chronic obstructive pulmonary disease) [facility])     Past Surgical History  Procedure Laterality Date  . Colonoscopy  2011    normal- [facility] docs    No family history on file.  Social History   Social History  . Marital Status: Married    Spouse Name: N/A  . Number of Children: N/A  . Years of Education: N/A   Occupational History  . Not on file.   Social History Main Topics  . Smoking status: Former Smoker    Quit date: 07/19/2015  . Smokeless tobacco: Not on file  . Alcohol Use: 0.0 oz/week    0 Standard drinks or equivalent per week   . Drug Use: Yes     Comment: marijuana  . Sexual Activity: No   Other Topics Concern  . Not on file   Social History Narrative    No Known Allergies   Review of Systems  Constitutional: Negative for fever, chills, weight loss and malaise/fatigue.  HENT: Negative for ear discharge, ear pain and sore throat.   Eyes: Negative for blurred vision.  Respiratory: Negative for cough, sputum production, shortness of breath and wheezing.   Cardiovascular: Negative for chest pain, palpitations and leg swelling.  Gastrointestinal: Negative for heartburn, nausea, abdominal pain, diarrhea, constipation, blood in stool and melena.  Genitourinary: Negative for dysuria, urgency, frequency and hematuria.  Musculoskeletal: Negative for myalgias, back pain, joint pain, falls and neck pain.       Left lateral pelvic area tenderness  Skin: Positive for rash.  Neurological: Negative for dizziness, tingling, sensory change, focal weakness and headaches.  Endo/Heme/Allergies: Negative for environmental allergies and polydipsia. Does not bruise/bleed easily.  Psychiatric/Behavioral: Negative for depression and suicidal ideas. The patient is not nervous/anxious and does not have insomnia.      Objective  Filed Vitals:   02/12/16 1347  BP: 130/56  Pulse: 64  Height:  (1.854 m)  Weight: 139 lb (63.05 kg)    Physical Exam  Constitutional: He is oriented to person,  place, and time and well-developed, well-nourished, and in no distress.  HENT:  Head: Normocephalic.  Right Ear: External ear normal.  Left Ear: External ear normal.  Nose: Nose normal.  Mouth/Throat: Oropharynx is clear and moist.  Eyes: Conjunctivae and EOM are normal. Pupils are equal, round, and reactive to light. Right eye exhibits no discharge. Left eye exhibits no discharge. No scleral icterus.  Neck: Normal range of motion. Neck supple. No JVD present. No tracheal deviation present. No thyromegaly present.   Cardiovascular: Normal rate, regular rhythm, normal heart sounds and intact distal pulses.  Exam reveals no gallop and no friction rub.   No murmur heard. Pulmonary/Chest: Breath sounds normal. No respiratory distress. He has no wheezes. He has no rales.  Abdominal: Soft. Bowel sounds are normal. He exhibits no mass. There is no hepatosplenomegaly. There is no tenderness. There is no rebound, no guarding and no CVA tenderness.  Musculoskeletal: Normal range of motion. He exhibits tenderness. He exhibits no edema.  Iliac crest and greater trochanter  Lymphadenopathy:    He has no cervical adenopathy.  Neurological: He is alert and oriented to person, place, and time. He has normal sensation, normal strength, normal reflexes and intact cranial nerves. No cranial nerve deficit.  Skin: Skin is warm. No rash noted.  Psychiatric: Mood and affect normal.  Nursing note and vitals reviewed.     Assessment & Plan  Problem List Items Addressed This Visit    None    Visit Diagnoses    Tinea corporis    -  Primary    Relevant Medications    clotrimazole-betamethasone (LOTRISONE) cream    cephALEXin (KEFLEX) 500 MG capsule    Ischial pain, left        Relevant Medications    ibuprofen (ADVIL,MOTRIN) 800 MG tablet    Other Relevant Orders    DG HIP UNILAT WITH PELVIS 2-3 VIEWS LEFT (Completed)    Acute folliculitis        Relevant Medications    cephALEXin (KEFLEX) 500 MG capsule         Dr. Hayden Rasmusseneanna Jones [facility]  02/12/2016

## 2016-02-18 ENCOUNTER — Ambulatory Visit: Payer: PPO | Attending: Specialist

## 2016-03-14 ENCOUNTER — Ambulatory Visit (INDEPENDENT_AMBULATORY_CARE_PROVIDER_SITE_OTHER): Payer: PPO | Admitting: Family Medicine

## 2016-03-14 ENCOUNTER — Encounter: Payer: Self-pay | Admitting: Family Medicine

## 2016-03-14 VITALS — BP 110/70 | HR 76 | Ht 73.0 in | Wt 132.0 lb

## 2016-03-14 DIAGNOSIS — R918 Other nonspecific abnormal finding of lung field: Secondary | ICD-10-CM | POA: Diagnosis not present

## 2016-03-14 DIAGNOSIS — D649 Anemia, unspecified: Secondary | ICD-10-CM | POA: Diagnosis not present

## 2016-03-14 DIAGNOSIS — R351 Nocturia: Secondary | ICD-10-CM

## 2016-03-14 DIAGNOSIS — I679 Cerebrovascular disease, unspecified: Secondary | ICD-10-CM

## 2016-03-14 DIAGNOSIS — F17219 Nicotine dependence, cigarettes, with unspecified nicotine-induced disorders: Secondary | ICD-10-CM | POA: Diagnosis not present

## 2016-03-14 DIAGNOSIS — R634 Abnormal weight loss: Secondary | ICD-10-CM | POA: Diagnosis not present

## 2016-03-14 DIAGNOSIS — E785 Hyperlipidemia, unspecified: Secondary | ICD-10-CM | POA: Diagnosis not present

## 2016-03-14 DIAGNOSIS — J432 Centrilobular emphysema: Secondary | ICD-10-CM

## 2016-03-14 LAB — POCT URINALYSIS DIPSTICK
Bilirubin, UA: NEGATIVE
Glucose, UA: NEGATIVE
Ketones, UA: NEGATIVE
Leukocytes, UA: NEGATIVE
NITRITE UA: NEGATIVE
PROTEIN UA: NEGATIVE
RBC UA: NEGATIVE
SPEC GRAV UA: 1.02
UROBILINOGEN UA: 0.2
pH, UA: 6

## 2016-03-14 MED ORDER — CLOPIDOGREL BISULFATE 75 MG PO TABS: 75.0000 mg | ORAL_TABLET | Freq: Every day | ORAL | Status: DC

## 2016-03-14 MED ORDER — PRAVASTATIN SODIUM 20 MG PO TABS: 20.0000 mg | ORAL_TABLET | Freq: Every day | ORAL | Status: DC

## 2016-03-14 NOTE — Progress Notes (Signed)
Name: Nathaniel Garcia   MRN: [id number]    DOB: 01/25/1946   Date:03/14/2016       Progress Note  Subjective  Chief Complaint  Chief Complaint  Patient presents with  . COPD  . Hyperlipidemia    not taking Prav.  . Cerebrovascular Accident    taking Plavix    Hyperlipidemia This is a chronic problem. The current episode started more than 1 year ago. The problem is controlled. Recent lipid tests were reviewed and are normal. He has no history of chronic renal disease, diabetes, hypothyroidism, liver disease, obesity or nephrotic syndrome. There are no known factors aggravating his hyperlipidemia. Pertinent negatives include no chest pain, focal sensory loss, focal weakness, leg pain, myalgias or shortness of breath. Current antihyperlipidemic treatment includes statins. The current treatment provides mild improvement of lipids. There are no compliance problems.  There are no known risk factors for coronary artery disease.  Cerebrovascular Accident This is a chronic problem. The current episode started more than 1 year ago. The problem has been gradually improving. Pertinent negatives include no abdominal pain, anorexia, arthralgias, change in bowel habit, chest pain, chills, congestion, coughing, fatigue, fever, headaches, joint swelling, myalgias, nausea, neck pain, rash, sore throat, swollen glands, urinary symptoms, vertigo, visual change or vomiting. Nothing aggravates the symptoms. Treatments tried: plavix. The treatment provided mild relief.    No problem-specific assessment & plan notes found for this encounter.   Past Medical History  Diagnosis Date  . COPD (chronic obstructive pulmonary disease) [facility](HCC)     Past Surgical History  Procedure Laterality Date  . Colonoscopy  2011    normal- MD docs    History reviewed. No pertinent family history.  Social History   Social History  . Marital Status: Married    Spouse Name: N/A  . Number of Children: N/A  . Years of  Education: N/A   Occupational History  . Not on file.   Social History Main Topics  . Smoking status: Former Smoker    Quit date: 07/19/2015  . Smokeless tobacco: Not on file  . Alcohol Use: 0.0 oz/week    0 Standard drinks or equivalent per week  . Drug Use: Yes     Comment: marijuana  . Sexual Activity: No   Other Topics Concern  . Not on file   Social History Narrative    No Known Allergies   Review of Systems  Constitutional: Negative for fever, chills, weight loss, malaise/fatigue and fatigue.  HENT: Negative for congestion, ear discharge, ear pain and sore throat.   Eyes: Negative for blurred vision.  Respiratory: Negative for cough, sputum production, shortness of breath and wheezing.   Cardiovascular: Negative for chest pain, palpitations and leg swelling.  Gastrointestinal: Negative for heartburn, nausea, vomiting, abdominal pain, diarrhea, constipation, blood in stool, melena, anorexia and change in bowel habit.  Genitourinary: Negative for dysuria, urgency, frequency and hematuria.  Musculoskeletal: Negative for myalgias, back pain, joint pain, joint swelling, arthralgias and neck pain.  Skin: Negative for rash.  Neurological: Negative for dizziness, vertigo, tingling, sensory change, focal weakness and headaches.  Endo/Heme/Allergies: Negative for environmental allergies and polydipsia. Does not bruise/bleed easily.  Psychiatric/Behavioral: Negative for depression and suicidal ideas. The patient is not nervous/anxious and does not have insomnia.      Objective  Filed Vitals:   03/14/16 0913  BP: 110/70  Pulse: 76  Height: 6\' 1"  (1.854 m)  Weight: 132 lb (59.875 kg)    Physical Exam  Constitutional: He is  oriented to person, place, and time and well-developed, well-nourished, and in no distress.  HENT:  Head: Normocephalic.  Right Ear: External ear normal.  Left Ear: External ear normal.  Nose: Nose normal.  Mouth/Throat: Oropharynx is clear and  moist.  Eyes: Conjunctivae and EOM are normal. Pupils are equal, round, and reactive to light. Right eye exhibits no discharge. Left eye exhibits no discharge. No scleral icterus.  Neck: Normal range of motion. Neck supple. No JVD present. No tracheal deviation present. No thyromegaly present.  Cardiovascular: Normal rate, regular rhythm, normal heart sounds and intact distal pulses.  Exam reveals no gallop and no friction rub.   No murmur heard. Pulmonary/Chest: Breath sounds normal. No respiratory distress. He has no wheezes. He has no rales.  Abdominal: Soft. Bowel sounds are normal. He exhibits no mass. There is no hepatosplenomegaly. There is no tenderness. There is no rebound, no guarding and no CVA tenderness.  Genitourinary: Rectum normal and prostate normal.  Musculoskeletal: Normal range of motion. He exhibits no edema or tenderness.  Lymphadenopathy:    He has no cervical adenopathy.  Neurological: He is alert and oriented to person, place, and time. He has normal sensation, normal strength, normal reflexes and intact cranial nerves. No cranial nerve deficit.  Skin: Skin is warm. No rash noted.  Psychiatric: Mood and affect normal.  Nursing note and vitals reviewed.     Assessment & Plan  Problem List Items Addressed This Visit      Cardiovascular and Mediastinum   Cerebral vascular disease - Primary   Relevant Medications   clopidogrel (PLAVIX) 75 MG tablet   pravastatin (PRAVACHOL) 20 MG tablet     Respiratory   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HCC)     Other   Absolute anemia   Relevant Orders   CBC w/Diff/Platelet   HLD (hyperlipidemia)   Relevant Medications   pravastatin (PRAVACHOL) 20 MG tablet   Lung nodule, multiple   Relevant Orders   Renal Function Panel   Ambulatory referral to Pulmonology    Other Visit Diagnoses    Weight loss        Relevant Orders    Hepatic function panel    Ambulatory referral to Pulmonology    POCT Urinalysis Dipstick (Completed)     Nocturia        Relevant Orders    PSA    POCT Urinalysis Dipstick (Completed)    Cigarette nicotine dependence with nicotine-induced disorder        Relevant Orders    Ambulatory referral to Pulmonology     sched appt for 03/18/16 @ 3:00 Dr Meredeth Ide    Dr. Hayden [facility]  03/14/2016

## 2016-03-15 LAB — RENAL FUNCTION PANEL
Albumin: 4.1 g/dL (ref 3.5–4.8)
BUN / CREAT RATIO: 9 — AB (ref 10–24)
BUN: 10 mg/dL (ref 8–27)
CALCIUM: 9.2 mg/dL (ref 8.6–10.2)
CHLORIDE: 102 mmol/L (ref 96–106)
CO2: 24 mmol/L (ref 18–29)
Creatinine, Ser: 1.09 mg/dL (ref 0.76–1.27)
GFR calc Af Amer: 79 mL/min/{1.73_m2} (ref >59)
GFR calc non Af Amer: 68 mL/min/{1.73_m2} (ref >59)
Glucose: 87 mg/dL (ref 65–99)
POTASSIUM: 4.8 mmol/L (ref 3.5–5.2)
Phosphorus: 2.5 mg/dL (ref 2.5–4.5)
Sodium: 141 mmol/L (ref 134–144)

## 2016-03-15 LAB — CBC WITH DIFFERENTIAL/PLATELET
BASOS ABS: 0 10*3/uL (ref 0.0–0.2)
Basos: 0 %
EOS (ABSOLUTE): 0.1 10*3/uL (ref 0.0–0.4)
Eos: 1 %
Hematocrit: 38.7 % (ref 37.5–51.0)
Hemoglobin: 13.1 g/dL (ref 12.6–17.7)
Immature Grans (Abs): 0 10*3/uL (ref 0.0–0.1)
Immature Granulocytes: 0 %
LYMPHS ABS: 1.7 10*3/uL (ref 0.7–3.1)
Lymphs: 28 %
MCH: 33.1 pg — ABNORMAL HIGH (ref 26.6–33.0)
MCHC: 33.9 g/dL (ref 31.5–35.7)
MCV: 98 fL — ABNORMAL HIGH (ref 79–97)
MONOCYTES: 11 %
MONOS ABS: 0.7 10*3/uL (ref 0.1–0.9)
Neutrophils Absolute: 3.7 10*3/uL (ref 1.4–7.0)
Neutrophils: 60 %
PLATELETS: 226 10*3/uL (ref 150–379)
RBC: 3.96 x10E6/uL — AB (ref 4.14–5.80)
RDW: 14 % (ref 12.3–15.4)
WBC: 6.1 10*3/uL (ref 3.4–10.8)

## 2016-03-15 LAB — HEPATIC FUNCTION PANEL
ALT: 7 IU/L (ref 0–44)
AST: 16 IU/L (ref 0–40)
Alkaline Phosphatase: 56 IU/L (ref 39–117)
BILIRUBIN TOTAL: 0.3 mg/dL (ref 0.0–1.2)
Bilirubin, Direct: 0.09 mg/dL (ref 0.00–0.40)
TOTAL PROTEIN: 7.1 g/dL (ref 6.0–8.5)

## 2016-03-15 LAB — PSA: Prostate Specific Ag, Serum: 0.9 ng/mL (ref 0.0–4.0)

## 2016-07-02 DIAGNOSIS — R29898 Other symptoms and signs involving the musculoskeletal system: Secondary | ICD-10-CM | POA: Insufficient documentation

## 2016-09-15 ENCOUNTER — Encounter: Payer: Self-pay | Admitting: Family Medicine

## 2016-09-15 ENCOUNTER — Ambulatory Visit (INDEPENDENT_AMBULATORY_CARE_PROVIDER_SITE_OTHER): Payer: PPO | Admitting: Family Medicine

## 2016-09-15 VITALS — BP 130/80 | HR 64 | Ht 73.0 in | Wt 128.0 lb

## 2016-09-15 DIAGNOSIS — Z9119 Patient's noncompliance with other medical treatment and regimen: Secondary | ICD-10-CM

## 2016-09-15 DIAGNOSIS — R001 Bradycardia, unspecified: Secondary | ICD-10-CM | POA: Diagnosis not present

## 2016-09-15 DIAGNOSIS — J432 Centrilobular emphysema: Secondary | ICD-10-CM | POA: Diagnosis not present

## 2016-09-15 DIAGNOSIS — R634 Abnormal weight loss: Secondary | ICD-10-CM | POA: Diagnosis not present

## 2016-09-15 DIAGNOSIS — F17210 Nicotine dependence, cigarettes, uncomplicated: Secondary | ICD-10-CM | POA: Diagnosis not present

## 2016-09-15 DIAGNOSIS — I679 Cerebrovascular disease, unspecified: Secondary | ICD-10-CM

## 2016-09-15 DIAGNOSIS — R918 Other nonspecific abnormal finding of lung field: Secondary | ICD-10-CM | POA: Diagnosis not present

## 2016-09-15 DIAGNOSIS — E785 Hyperlipidemia, unspecified: Secondary | ICD-10-CM

## 2016-09-15 DIAGNOSIS — Z91199 Patient's noncompliance with other medical treatment and regimen due to unspecified reason: Secondary | ICD-10-CM

## 2016-09-15 LAB — HEMOCCULT GUIAC POC 1CARD (OFFICE): FECAL OCCULT BLD: NEGATIVE

## 2016-09-15 LAB — POCT URINALYSIS DIPSTICK
Bilirubin, UA: NEGATIVE
GLUCOSE UA: NEGATIVE
Ketones, UA: NEGATIVE
Leukocytes, UA: NEGATIVE
NITRITE UA: NEGATIVE
PH UA: 5
PROTEIN UA: NEGATIVE
Spec Grav, UA: 1.015
UROBILINOGEN UA: 0.2

## 2016-09-15 MED ORDER — TIOTROPIUM BROMIDE MONOHYDRATE 18 MCG IN CAPS: 18.0000 ug | ORAL_CAPSULE | RESPIRATORY_TRACT | 12 refills | Status: DC

## 2016-09-15 MED ORDER — ALBUTEROL SULFATE HFA 108 (90 BASE) MCG/ACT IN AERS: 2.0000 | INHALATION_SPRAY | Freq: Four times a day (QID) | RESPIRATORY_TRACT | 11 refills | Status: DC | PRN

## 2016-09-15 MED ORDER — CLOPIDOGREL BISULFATE 75 MG PO TABS: 75.0000 mg | ORAL_TABLET | Freq: Every day | ORAL | 6 refills | Status: DC

## 2016-09-15 MED ORDER — ATORVASTATIN CALCIUM 80 MG PO TABS: 80.0000 mg | ORAL_TABLET | Freq: Every day | ORAL | 11 refills | Status: DC

## 2016-09-15 NOTE — Progress Notes (Signed)
Name: Nathaniel Garcia   MRN: [id number]    DOB: 03/07/1946   Date:09/15/2016       Progress Note  Subjective  Chief Complaint  Chief Complaint  Patient presents with  . Follow-up    pt had a stroke in August and was put on Plavix, Aspirin and Atorvastatin- [facility], but had "a cruise to go on, so I cancelled"    HPI  No problem-specific Assessment & Plan notes found for this encounter.   Past Medical History:  Diagnosis Date  . COPD (chronic obstructive pulmonary disease) (HCC)   . Stroke [facility](HCC)     Past Surgical History:  Procedure Laterality Date  . COLONOSCOPY  2011   normal- MD docs    History reviewed. No pertinent family history.  Social History   Social History  . Marital status: Married    Spouse name: N/A  . Number of children: N/A  . Years of education: N/A   Occupational History  . Not on file.   Social History Main Topics  . Smoking status: Current Some Day Smoker    Last attempt to quit: 07/19/2015  . Smokeless tobacco: Never Used  . Alcohol use 0.0 oz/week  . Drug use:      Comment: marijuana  . Sexual activity: No   Other Topics Concern  . Not on file   Social History Narrative  . No narrative on file    No Known Allergies   Review of Systems  Constitutional: Negative for chills, fever, malaise/fatigue and weight loss.  HENT: Negative for ear discharge, ear pain and sore throat.   Eyes: Negative for blurred vision.  Respiratory: Negative for cough, sputum production, shortness of breath and wheezing.   Cardiovascular: Negative for chest pain, palpitations and leg swelling.  Gastrointestinal: Negative for abdominal pain, blood in stool, constipation, diarrhea, heartburn, melena and nausea.  Genitourinary: Negative for dysuria, frequency, hematuria and urgency.  Musculoskeletal: Negative for back pain, joint pain, myalgias and neck pain.  Skin: Negative for rash.  Neurological: Negative  for dizziness, tingling, sensory change, focal weakness and headaches.  Endo/Heme/Allergies: Negative for environmental allergies and polydipsia. Does not bruise/bleed easily.  Psychiatric/Behavioral: Negative for depression and suicidal ideas. The patient is not nervous/anxious and does not have insomnia.      Objective  Vitals:   09/15/16 0922  BP: 130/80  Pulse: 64  Weight: 128 lb (58.1 kg)  Height: 6\' 1"  (1.854 m)    Physical Exam  Constitutional: He is oriented to person, place, and time and well-developed, well-nourished, and in no distress.  HENT:  Head: Normocephalic.  Right Ear: External ear normal.  Left Ear: External ear normal.  Nose: Nose normal.  Mouth/Throat: Oropharynx is clear and moist.  Eyes: Conjunctivae and EOM are normal. Pupils are equal, round, and reactive to light. Right eye exhibits no discharge. Left eye exhibits no discharge. No scleral icterus.  Neck: Normal range of motion. Neck supple. Normal carotid pulses, no hepatojugular reflux and no JVD present. No tracheal deviation present. No thyromegaly present.  Cardiovascular: Normal rate, regular rhythm, S1 normal, S2 normal, normal heart sounds, intact distal pulses and normal pulses.  Exam reveals no gallop, no S3, no S4 and no friction rub.   No murmur heard. Pulmonary/Chest: Breath sounds normal. No respiratory distress. He has no wheezes. He has no rales.  Abdominal: Soft. Bowel sounds are normal. He exhibits no mass. There is no hepatosplenomegaly. There  is no tenderness. There is no rebound, no guarding and no CVA tenderness.  Genitourinary: Rectum normal. Prostate is not enlarged and not tender.  Musculoskeletal: Normal range of motion. He exhibits no edema or tenderness.  Lymphadenopathy:    He has no cervical adenopathy.  Neurological: He is alert and oriented to person, place, and time. He has normal sensation, normal strength, normal reflexes and intact cranial nerves. No cranial nerve  deficit.  Skin: Skin is warm. No rash noted.  Psychiatric: Mood and affect normal.  Nursing note and vitals reviewed.     Assessment & Plan  Problem List Items Addressed This Visit      Cardiovascular and Mediastinum   Cerebral vascular disease - Primary   Relevant Medications   aspirin EC 81 MG tablet   clopidogrel (PLAVIX) 75 MG tablet   atorvastatin (LIPITOR) 80 MG tablet   Other Relevant Orders   Lipid Profile     Respiratory   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HCC)   Relevant Medications   albuterol (PROVENTIL HFA;VENTOLIN HFA) 108 (90 Base) MCG/ACT inhaler   tiotropium (SPIRIVA HANDIHALER) 18 MCG inhalation capsule   Other Relevant Orders   Ambulatory referral to Pulmonology     Other   HLD (hyperlipidemia)   Relevant Medications   aspirin EC 81 MG tablet   atorvastatin (LIPITOR) 80 MG tablet   Other Relevant Orders   Lipid Profile   Lung nodule, multiple   Bradycardia   Relevant Orders   Renal Function Panel   Ambulatory referral to Cardiology    Other Visit Diagnoses    Cigarette nicotine dependence without complication       Weight loss, unintentional       Relevant Orders   Renal Function Panel   POCT Occult Blood Stool (Completed)   POCT Urinalysis Dipstick (Completed)   PSA   Urinalysis, microscopic only   Noncompliance with diagnostic test       missed cardiologist/ct    I spent 45 minutes with this patient, More than 50% of that time was spent in face to face education, counseling and care coordination.   Dr. Hayden Rasmusseneanna Jaquila Santelli [facility]  09/15/16

## 2016-09-16 LAB — RENAL FUNCTION PANEL
Albumin: 4.7 g/dL (ref 3.5–4.8)
BUN / CREAT RATIO: 14 (ref 10–24)
BUN: 12 mg/dL (ref 8–27)
CALCIUM: 9.5 mg/dL (ref 8.6–10.2)
CHLORIDE: 103 mmol/L (ref 96–106)
CO2: 26 mmol/L (ref 18–29)
Creatinine, Ser: 0.87 mg/dL (ref 0.76–1.27)
GFR calc non Af Amer: 87 mL/min/{1.73_m2} (ref >59)
GFR, EST AFRICAN AMERICAN: 100 mL/min/{1.73_m2} (ref >59)
GLUCOSE: 89 mg/dL (ref 65–99)
POTASSIUM: 5 mmol/L (ref 3.5–5.2)
Phosphorus: 2.9 mg/dL (ref 2.5–4.5)
Sodium: 143 mmol/L (ref 134–144)

## 2016-09-16 LAB — LIPID PANEL
CHOLESTEROL TOTAL: 158 mg/dL (ref 100–199)
Chol/HDL Ratio: 2.7 ratio units (ref 0.0–5.0)
HDL: 59 mg/dL (ref >39)
LDL CALC: 90 mg/dL (ref 0–99)
Triglycerides: 46 mg/dL (ref 0–149)
VLDL CHOLESTEROL CAL: 9 mg/dL (ref 5–40)

## 2016-09-16 LAB — URINALYSIS, MICROSCOPIC ONLY
CASTS: NONE SEEN /LPF
Epithelial Cells (non renal): NONE SEEN /hpf (ref 0–10)

## 2016-09-16 LAB — PSA: PROSTATE SPECIFIC AG, SERUM: 2 ng/mL (ref 0.0–4.0)

## 2016-09-17 ENCOUNTER — Other Ambulatory Visit: Payer: Self-pay

## 2016-09-17 DIAGNOSIS — R3129 Other microscopic hematuria: Secondary | ICD-10-CM

## 2016-09-18 DIAGNOSIS — N029 Recurrent and persistent hematuria with unspecified morphologic changes: Secondary | ICD-10-CM | POA: Insufficient documentation

## 2016-09-25 ENCOUNTER — Other Ambulatory Visit: Payer: Self-pay | Admitting: *Deleted

## 2016-09-25 DIAGNOSIS — R3129 Other microscopic hematuria: Secondary | ICD-10-CM

## 2016-09-26 ENCOUNTER — Other Ambulatory Visit
Admission: RE | Admit: 2016-09-26 | Discharge: 2016-09-26 | Disposition: A | Discharge status: Home / Self Care | Payer: PPO | Source: Ambulatory Visit | Attending: Urology | Admitting: Urology

## 2016-09-26 ENCOUNTER — Ambulatory Visit: Admission: RE | Admit: 2016-09-26 | Payer: PPO | Source: Ambulatory Visit

## 2016-09-26 ENCOUNTER — Encounter: Payer: Self-pay | Admitting: Urology

## 2016-09-26 ENCOUNTER — Ambulatory Visit
Admission: RE | Admit: 2016-09-26 | Discharge: 2016-09-26 | Disposition: A | Discharge status: Home / Self Care | Payer: PPO | Source: Ambulatory Visit | Attending: Specialist | Admitting: Specialist

## 2016-09-26 ENCOUNTER — Ambulatory Visit (INDEPENDENT_AMBULATORY_CARE_PROVIDER_SITE_OTHER): Payer: PPO | Admitting: Urology

## 2016-09-26 VITALS — BP 152/60 | HR 68 | Ht 73.0 in | Wt 122.0 lb

## 2016-09-26 DIAGNOSIS — R972 Elevated prostate specific antigen [PSA]: Secondary | ICD-10-CM

## 2016-09-26 DIAGNOSIS — J984 Other disorders of lung: Secondary | ICD-10-CM | POA: Insufficient documentation

## 2016-09-26 DIAGNOSIS — R918 Other nonspecific abnormal finding of lung field: Secondary | ICD-10-CM

## 2016-09-26 DIAGNOSIS — I700 Atherosclerosis of aorta: Secondary | ICD-10-CM | POA: Diagnosis not present

## 2016-09-26 DIAGNOSIS — R3129 Other microscopic hematuria: Secondary | ICD-10-CM | POA: Insufficient documentation

## 2016-09-26 DIAGNOSIS — J439 Emphysema, unspecified: Secondary | ICD-10-CM | POA: Insufficient documentation

## 2016-09-26 LAB — URINALYSIS COMPLETE WITH MICROSCOPIC (ARMC ONLY)
BACTERIA UA: NONE SEEN
Glucose, UA: NEGATIVE mg/dL
LEUKOCYTES UA: NEGATIVE
Nitrite: NEGATIVE
PH: 5.5 (ref 5.0–8.0)
PROTEIN: 30 mg/dL — AB
SQUAMOUS EPITHELIAL / LPF: NONE SEEN
Specific Gravity, Urine: 1.025 (ref 1.005–1.030)

## 2016-09-26 LAB — BUN: BUN: 13 mg/dL (ref 6–20)

## 2016-09-26 LAB — CREATININE, SERUM
Creatinine, Ser: 1.16 mg/dL (ref 0.61–1.24)
GFR calc Af Amer: >60 mL/min (ref >60)

## 2016-09-26 NOTE — Progress Notes (Signed)
09/26/2016 10:07 AM   Nathaniel Garcia 02/04/1946 [id number]  Referring provider: Duanne Limerickeanna C Jones, MD [address] [place], [place] [address]  Chief Complaint  Patient presents with  . New Patient (Initial Visit)    Hematuria referred by Dr. Yetta BarreJones    HPI: Patient is a 89 -year-old [place] male who presents today as a referral from their PCP, Dr. Yetta BarreJones, for microscopic hematuria.    Patient was found to have microscopic hematuria on 09/15/2016 with 3-10 RBC's/hpf.  Patient /doesn't have a prior history of microscopic hematuria.    He does not have a prior history of recurrent urinary tract infections, nephrolithiasis, trauma to the genitourinary tract, BPH or malignancies of the genitourinary tract.   He does not have a family medical history of nephrolithiasis, malignancies of the genitourinary tract or hematuria.   Today, he are having nocturia x 1 for the last ten years.  He denies symptoms of frequent urination, urgency, dysuria, incontinence, hesitancy, intermittency, straining to urinate or a weak urinary stream.  His UA today demonstrates 0-5 RBC's/hpf.    He is not experiencing any suprapubic pain, abdominal pain or flank pain. He denies any recent fevers, chills, nausea or vomiting.   He underwent a contrast CT in 2016 for SOB, weight loss, malaise and fatigue.  It noted No acute findings in the abdomen or pelvis.  Aortoiliac atherosclerosis.  Diffuse colonic diverticulosis.  I have independently reviewed the films.    He is a smoker.  He is a former Administratorlandscaper.     PMH: Past Medical History:  Diagnosis Date  . COPD (chronic obstructive pulmonary disease) (HCC)   . Hematuria   . Stroke [facility](HCC)     Surgical History: Past Surgical History:  Procedure Laterality Date  . COLONOSCOPY  2011   normal- MD docs    Home Medications:    Medication List       Accurate as of 09/26/16 10:07 AM. Always use your most recent med list.          albuterol 108 (90 Base) MCG/ACT inhaler Commonly known as:  PROVENTIL HFA;VENTOLIN HFA Inhale 2 puffs into the lungs every 6 (six) hours as needed for wheezing or shortness of breath.   aspirin EC 81 MG tablet Take 1 tablet by mouth daily.   atorvastatin 80 MG tablet Commonly known as:  LIPITOR Take 1 tablet (80 mg total) by mouth daily.   clopidogrel 75 MG tablet Commonly known as:  PLAVIX Take 1 tablet (75 mg total) by mouth daily.   clotrimazole-betamethasone cream Commonly known as:  LOTRISONE Apply 1 application topically 2 (two) times daily.   tiotropium 18 MCG inhalation capsule Commonly known as:  SPIRIVA HANDIHALER Place 1 capsule (18 mcg total) into inhaler and inhale 1 day or 1 dose.       Allergies: No Known Allergies  Family History: Family History  Problem Relation Age of Onset  . Prostate cancer Neg Hx   . Kidney disease Neg Hx     Social History:  reports that he has been smoking.  He has never used smokeless tobacco. He reports that he drinks alcohol. He reports that he uses drugs.  ROS: UROLOGY Frequent Urination?: No Hard to postpone urination?: No Burning/pain with urination?: No Get up at night to urinate?: Yes Leakage of urine?: No Urine stream starts and stops?: No Trouble starting stream?: No Do you have to strain to urinate?: No Blood in urine?: No Urinary tract infection?:  No Sexually transmitted disease?: No Injury to kidneys or bladder?: No Painful intercourse?: No Weak stream?: No Erection problems?: No Penile pain?: No  Gastrointestinal Nausea?: No Vomiting?: No Indigestion/heartburn?: No Diarrhea?: No Constipation?: No  Constitutional Fever: No Night sweats?: No Weight loss?: No Fatigue?: No  Skin Skin rash/lesions?: No Itching?: No  Eyes Blurred vision?: No Double vision?: No  Ears/Nose/Throat Sore throat?: No Sinus problems?: No  Hematologic/Lymphatic Swollen glands?: No Easy bruising?:  No  Cardiovascular Leg swelling?: No Chest pain?: No  Respiratory Cough?: No Shortness of breath?: No  Endocrine Excessive thirst?: No  Musculoskeletal Back pain?: No Joint pain?: No  Neurological Headaches?: No Dizziness?: No  Psychologic Depression?: No Anxiety?: No  Physical Exam: BP (!) 152/60   Pulse 68   Ht 6\' 1"  (1.854 m)   Wt 122 lb (55.3 kg)   BMI 16.10 kg/m   Constitutional: Well nourished. Alert and oriented, No acute distress. HEENT: [place] AT, moist mucus membranes. Trachea midline, no masses. Cardiovascular: No clubbing, cyanosis, or edema. Respiratory: Normal respiratory effort, no increased work of breathing. GI: Abdomen is soft, non tender, non distended, no abdominal masses. Liver and spleen not palpable.  No hernias appreciated.  Stool sample for occult testing is not indicated.   GU: No CVA tenderness.  No bladder fullness or masses.  Patient with circumcised phallus.   Urethral meatus is patent.  No penile discharge. No penile lesions or rashes. Scrotum without lesions, cysts, rashes and/or edema.  Testicles are located scrotally bilaterally. No masses are appreciated in the testicles. Left and right epididymis are normal. Rectal: Patient with  normal sphincter tone. Anus and perineum without scarring or rashes. No rectal masses are appreciated. Prostate is approximately 45 grams, firmer in the right lobe, no nodules are appreciated. Seminal vesicles are normal. Skin: No rashes, bruises or suspicious lesions. Lymph: No cervical or inguinal adenopathy. Neurologic: Grossly intact, no focal deficits, moving all 4 extremities. Psychiatric: Normal mood and affect.  Laboratory Data: Lab Results  Component Value Date   WBC 6.1 03/14/2016   HGB 15.6 06/14/2014   HCT 38.7 03/14/2016   MCV 98 (H) 03/14/2016   PLT 226 03/14/2016    Lab Results  Component Value Date   CREATININE 1.16 09/26/2016    Lab Results  Component Value Date   PSA 1.0  06/14/2014       Component Value Date/Time   CHOL 158 09/15/2016 1016   HDL 59 09/15/2016 1016   CHOLHDL 2.7 09/15/2016 1016   LDLCALC 90 09/15/2016 1016    Lab Results  Component Value Date   AST 16 03/14/2016   Lab Results  Component Value Date   ALT 7 03/14/2016    Urinalysis   Pertinent Imaging: CLINICAL DATA:  Recurrent shortness of Breath. Weight loss. Malaise, fatigue.  EXAM: CT ABDOMEN AND PELVIS WITH CONTRAST  TECHNIQUE: Multidetector CT imaging of the abdomen and pelvis was performed using the standard protocol following bolus administration of intravenous contrast.  CONTRAST:  OMNIPAQUE IOHEXOL 300 MG/ML  SOLN  COMPARISON:  None.  FINDINGS: Emphysematous changes noted in the lung bases. No effusions. Heart is normal size.  Liver, gallbladder, spleen, pancreas, adrenals and kidneys are unremarkable.  Urinary bladder is decompressed, grossly unremarkable.  Extensive diffuse colonic diverticulosis. No active diverticulitis. Small bowel and stomach are decompressed, unremarkable.  Aortic and iliac calcification without aneurysm. No free fluid, free air or adenopathy.  Degenerative changes in the lumbar spine. No acute bony abnormality or focal bone lesion.  IMPRESSION: No acute findings in the abdomen or pelvis.  Aortoiliac atherosclerosis.  Diffuse colonic diverticulosis.   Electronically Signed   By: Charlett NoseKevin  Dover M.D.   On: 07/30/2015 10:15      Assessment & Plan:    1. Microscopic hematuria  -  I explained to the patient that there are a number of causes that can be associated with blood in the urine, such as stones,  BPH, UTI's, damage to the urinary tract and/or cancer.  - At this time, I felt that the patient warranted further urologic evaluation.   The AUA guidelines state that a CT urogram is the preferred imaging study to evaluate hematuria.  - I explained to the patient that a contrast material will be  injected into a vein and that in rare instances, an allergic reaction can result and may even life threatening   The patient denies any allergies to contrast, iodine and/or seafood and is not taking metformin.  - Following the imaging study,  I've recommended a cystoscopy. I described how this is performed, typically in an office setting with a flexible cystoscope. We described the risks, benefits, and possible side effects, the most common of which is a minor amount of blood in the urine and/or burning which usually resolves in 24 to 48 hours.    - The patient had the opportunity to ask questions which were answered. Based upon this discussion, the patient is willing to proceed. Therefore, I've ordered: a CT Urogram and cystoscopy.  - He will return following all of the above for discussion of the results.     2. Increase in PSA velocity  - I discussed with the patient that PSA is an acronym for  prostate specific antigen,  which is a protein made by the prostate gland and can be detected in the blood stream. I explained to the patient situations that would increase the PSA, such as: a man's age,  BPH, infection, recent intercourse/ejaculation, prostate infarction, recent urethroscopic manipulation (Foley placement/cystoscopy) and prostate cancer.   - At this time, I have advised the patient that we will repeat the PSA to rule out lab error.  If that should return elevated, we could continue observation or pursue a prostate biopsy.   - We discussed that indications for prostate biopsy are defined by age and race specific PSA cutoffs as well as a PSA velocity of 0.75/year.             - His questions where answered and he voiced his understanding              Return for CT Urogram report and cystoscopy.  These notes generated with voice recognition software. I apologize for typographical errors.  Michiel CowboySHANNON Diona Peregoy, PA-C  [facility] [address], [address] [place],  [place] [phone]

## 2016-09-27 LAB — URINE CULTURE: Culture: NO GROWTH

## 2016-10-13 ENCOUNTER — Ambulatory Visit
Admission: RE | Admit: 2016-10-13 | Discharge: 2016-10-13 | Disposition: A | Discharge status: Home / Self Care | Payer: PPO | Source: Ambulatory Visit | Attending: Urology | Admitting: Urology

## 2016-10-13 DIAGNOSIS — R3129 Other microscopic hematuria: Secondary | ICD-10-CM | POA: Diagnosis present

## 2016-10-13 DIAGNOSIS — N400 Enlarged prostate without lower urinary tract symptoms: Secondary | ICD-10-CM | POA: Insufficient documentation

## 2016-10-13 DIAGNOSIS — I700 Atherosclerosis of aorta: Secondary | ICD-10-CM | POA: Diagnosis not present

## 2016-10-13 MED ORDER — IOPAMIDOL (ISOVUE-300) INJECTION 61%
125.0000 mL | Freq: Once | INTRAVENOUS | Status: AC | PRN
  Administered 2016-10-13: 125 mL via INTRAVENOUS

## 2016-10-16 ENCOUNTER — Other Ambulatory Visit: Payer: Self-pay

## 2016-10-16 DIAGNOSIS — R3129 Other microscopic hematuria: Secondary | ICD-10-CM

## 2016-10-17 ENCOUNTER — Ambulatory Visit (INDEPENDENT_AMBULATORY_CARE_PROVIDER_SITE_OTHER): Payer: PPO | Admitting: Urology

## 2016-10-17 DIAGNOSIS — R3129 Other microscopic hematuria: Secondary | ICD-10-CM

## 2016-10-20 NOTE — Progress Notes (Signed)
Patient was a no show for cystoscopy.  Called  Cell phone, no answer or ability to leave message.   No show letter to be printed.  Vanna ScotlandAshley Mir Fullilove, MD

## 2016-10-30 ENCOUNTER — Telehealth: Payer: Self-pay

## 2016-10-30 NOTE — Telephone Encounter (Signed)
Pt called wanting CT results. Pt no showed for his results appt. Please advise.

## 2016-10-30 NOTE — Telephone Encounter (Signed)
He needs to be rescheduled for a cystoscopy and he will get his CT results at that time.

## 2016-10-31 NOTE — Telephone Encounter (Signed)
LMOM

## 2016-10-31 NOTE — Telephone Encounter (Signed)
Spoke with patient and [facility]   Michelle

## 2016-11-27 ENCOUNTER — Other Ambulatory Visit: Payer: Self-pay

## 2016-11-27 DIAGNOSIS — R310 Gross hematuria: Secondary | ICD-10-CM

## 2016-11-28 ENCOUNTER — Ambulatory Visit (INDEPENDENT_AMBULATORY_CARE_PROVIDER_SITE_OTHER): Payer: Self-pay | Admitting: Urology

## 2016-11-28 ENCOUNTER — Other Ambulatory Visit
Admission: RE | Admit: 2016-11-28 | Discharge: 2016-11-28 | Disposition: A | Discharge status: Home / Self Care | Payer: PPO | Source: Ambulatory Visit | Attending: Urology | Admitting: Urology

## 2016-11-28 ENCOUNTER — Encounter: Payer: Self-pay | Admitting: Urology

## 2016-11-28 VITALS — BP 144/78 | HR 55 | Ht 73.0 in | Wt 129.0 lb

## 2016-11-28 DIAGNOSIS — R3129 Other microscopic hematuria: Secondary | ICD-10-CM

## 2016-11-28 DIAGNOSIS — R972 Elevated prostate specific antigen [PSA]: Secondary | ICD-10-CM

## 2016-11-28 DIAGNOSIS — R310 Gross hematuria: Secondary | ICD-10-CM | POA: Insufficient documentation

## 2016-11-28 LAB — URINALYSIS, COMPLETE (UACMP) WITH MICROSCOPIC
BILIRUBIN URINE: NEGATIVE
Bacteria, UA: NONE SEEN
GLUCOSE, UA: NEGATIVE mg/dL
KETONES UR: NEGATIVE mg/dL
LEUKOCYTES UA: NEGATIVE
NITRITE: NEGATIVE
PH: 7 (ref 5.0–8.0)
Protein, ur: 30 mg/dL — AB
Specific Gravity, Urine: 1.02 (ref 1.005–1.030)
WBC, UA: NONE SEEN WBC/hpf (ref 0–5)

## 2016-11-28 MED ORDER — LIDOCAINE HCL 2 % EX GEL
1.0000 "application " | Freq: Once | CUTANEOUS | Status: AC
  Administered 2016-11-28: 1 via URETHRAL

## 2016-11-28 MED ORDER — CIPROFLOXACIN HCL 500 MG PO TABS
500.0000 mg | ORAL_TABLET | Freq: Once | ORAL | Status: AC
  Administered 2016-11-28: 500 mg via ORAL

## 2016-11-28 NOTE — Progress Notes (Signed)
   11/28/16  CC:  Chief Complaint  Patient presents with  . Cysto    HPI: 71 year old male with microscopic hematuria who presents today for office cystoscopy to complete his workup.  He is an active smoker.  CT urogram on 10/13/2016 showed prostatomegaly, otherwise no explanation for microscopic hematuria.  He also has a history of a rising PSA. His most recent PSA was 2.0 2 months ago. Prior to this, it was 0.9 8 months ago.  Most recent rectal exam on 09/26/16 by Michiel CowboyShannon McGowan shows a 45 grams, firmer in the right lobe, no nodules are appreciated.  Exam Blood pressure (!) 144/78, pulse (!) 55, height 6\' 1"  (1.854 m), weight 129 lb (58.5 kg). NED. A&Ox3.   No respiratory distress   Abd soft, NT, ND Normal phallus with bilateral descended testicles  UA Component     Latest Ref Rng & Units 11/28/2016  Color, Urine     YELLOW YELLOW  Appearance     CLEAR CLEAR  Glucose     NEGATIVE mg/dL NEGATIVE  Bilirubin Urine     NEGATIVE NEGATIVE  Ketones, ur     NEGATIVE mg/dL NEGATIVE  Specific Gravity, Urine     1.005 - 1.030 1.020  Hgb urine dipstick     NEGATIVE TRACE (A)  pH     5.0 - 8.0 7.0  Protein     NEGATIVE mg/dL 30 (A)  Nitrite     NEGATIVE NEGATIVE  Leukocytes, UA     NEGATIVE NEGATIVE  RBC / HPF     0 - 5 RBC/hpf 6-30  WBC, UA     0 - 5 WBC/hpf NONE SEEN  Bacteria, UA     NONE SEEN NONE SEEN  Squamous Epithelial / LPF     NONE SEEN 0-5 (A)    Cystoscopy Procedure Note  Patient identification was confirmed, informed consent was obtained, and patient was prepped using Betadine solution.  Lidocaine jelly was administered per urethral meatus.    Preoperative abx where received prior to procedure.     Pre-Procedure: - Inspection reveals a normal caliber ureteral meatus.  Procedure: The flexible cystoscope was introduced without difficulty - No urethral strictures/lesions are present. - Enlarged prostate with trilobar coaptation - Mildy elevated  bladder neck - Bilateral ureteral orifices identified - Bladder mucosa  reveals no ulcers, tumors, or lesions - No bladder stones - Minimal trabeculation  Retroflexion shows slight intravesical protrusion of the prostate gland, few small ectatic vessels of bladder neck   Post-Procedure: - Patient tolerated the procedure well  Assessment/ Plan:  1. Microscopic hematuria No evidence of disease on cystoscopy today other than BPH CT Urogram negative Findings reviewed with patient - ciprofloxacin (CIPRO) tablet 500 mg; Take 1 tablet (500 mg total) by mouth once. - lidocaine (XYLOCAINE) 2 % jelly 1 application; Place 1 application into the urethra once.  2. Increased prostate specific antigen (PSA) velocity Plan for repeat PSA in 6 month with rectal exam. Importance of close follow-up of this were discussed with the patient especially in the setting of slightly irregular prostate exam. If PSA continues to rise, we'll recommend prostate biopsy   Return in about 6 months (around 05/28/2017) for PSA/ DRE with Carollee HerterShannon.   Vanna ScotlandAshley Miciah Shealy, MD

## 2017-06-03 NOTE — Progress Notes (Signed)
06/05/2017 11:50 AM   Nathaniel Garcia 23-Sep-1946 [id number]  Referring provider: Duanne Limerick, MD [address] [place], [place] [address]  Chief Complaint  Patient presents with  . Elevated PSA    6 month follow up    HPI: 71 yo AAM with a history of hematuria, increase in PSA velocity and irregular prostate gland who presents for a 6 months follow up.  History of hematuria He is a smoker.  He is a former Administrator.  Completed hematuria work up in 10/2016 with CTU and cystoscopy.  No malignancies seen.  He does not report any gross hematuria.  UA today was unremarkable.    Rising PSA velocity PSA trend  1.0 in 05/2014  0.9 in 02/2016  2.0 in 08/2016  0.82 in 05/2017  Irregular prostate gland Right lobe found to be firmer on last exam.  Not present on today's exam.    BPH WITH LUTS  (prostate and/or bladder) His IPSS score today is 7, which is mild lower urinary tract symptomatology. He is most likely with his quality life due to his urinary symptoms.  His PVR is 138 mL.     His major complaints today are frequency, urgency, nocturia, incontinence and intermittency.  He has had these symptoms for several months.  He denies any dysuria, hematuria or suprapubic pain.   He also denies any recent fevers, chills, nausea or vomiting.  He does not have a family history of PCa.      IPSS    Row Name 06/05/17 1000         International Prostate Symptom Score   How often have you had the sensation of not emptying your bladder? Less than 1 in 5     How often have you had to urinate less than every two hours? Less than 1 in 5 times     How often have you found you stopped and started again several times when you urinated? Less than 1 in 5 times     How often have you found it difficult to postpone urination? Less than 1 in 5 times     How often have you had a weak urinary stream? Less than 1 in 5 times     How often have you had to strain to start  urination? Not at All     How many times did you typically get up at night to urinate? 2 Times     Total IPSS Score 7       Quality of Life due to urinary symptoms   If you were to spend the rest of your life with your urinary condition just the way it is now how would you feel about that? Mostly Satisfied        Score:  1-7 Mild 8-19 Moderate 20-35 Severe    PMH: Past Medical History:  Diagnosis Date  . COPD (chronic obstructive pulmonary disease) (HCC)   . Hematuria   . Stroke [facility])     Surgical History: Past Surgical History:  Procedure Laterality Date  . COLONOSCOPY  2011   normal- [facility] docs    Home Medications:  Allergies as of 06/05/2017   No Known Allergies     Medication List       Accurate as of 06/05/17 11:50 AM. Always use your most recent med list.          albuterol 108 (90 Base) MCG/ACT inhaler Commonly known as:  PROVENTIL HFA;VENTOLIN HFA Inhale  2 puffs into the lungs every 6 (six) hours as needed for wheezing or shortness of breath.   aspirin EC 81 MG tablet Take 1 tablet by mouth daily.   atorvastatin 80 MG tablet Commonly known as:  LIPITOR Take 1 tablet (80 mg total) by mouth daily.   clopidogrel 75 MG tablet Commonly known as:  PLAVIX Take 1 tablet (75 mg total) by mouth daily.   clotrimazole-betamethasone cream Commonly known as:  LOTRISONE Apply 1 application topically 2 (two) times daily.   tiotropium 18 MCG inhalation capsule Commonly known as:  SPIRIVA HANDIHALER Place 1 capsule (18 mcg total) into inhaler and inhale 1 day or 1 dose.       Allergies: No Known Allergies  Family History: Family History  Problem Relation Age of Onset  . Prostate cancer Neg Hx   . Kidney disease Neg Hx   . Kidney cancer Neg Hx   . Bladder Cancer Neg Hx     Social History:  reports that he has been smoking.  He has never used smokeless tobacco. He reports that he drinks alcohol. He reports that he uses drugs.  ROS: UROLOGY Frequent  Urination?: Yes Hard to postpone urination?: Yes Burning/pain with urination?: No Get up at night to urinate?: Yes Leakage of urine?: Yes Urine stream starts and stops?: No Trouble starting stream?: Yes Do you have to strain to urinate?: No Blood in urine?: No Urinary tract infection?: No Sexually transmitted disease?: No Injury to kidneys or bladder?: No Painful intercourse?: No Weak stream?: No Erection problems?: No Penile pain?: No  Gastrointestinal Nausea?: No Vomiting?: No Indigestion/heartburn?: No Diarrhea?: No Constipation?: No  Constitutional Fever: No Night sweats?: No Weight loss?: No Fatigue?: No  Skin Skin rash/lesions?: No Itching?: No  Eyes Blurred vision?: No Double vision?: No  Ears/Nose/Throat Sore throat?: No Sinus problems?: No  Hematologic/Lymphatic Swollen glands?: No Easy bruising?: No  Cardiovascular Leg swelling?: No Chest pain?: No  Respiratory Cough?: No Shortness of breath?: No  Endocrine Excessive thirst?: No  Musculoskeletal Back pain?: No Joint pain?: No  Neurological Headaches?: No Dizziness?: No  Psychologic Depression?: No Anxiety?: No  Physical Exam: BP (!) 150/70   Pulse (!) 55   Ht 6' (1.829 m)   Wt 122 lb (55.3 kg)   BMI 16.55 kg/m   Constitutional: Well nourished. Alert and oriented, No acute distress. HEENT: [place] AT, moist mucus membranes. Trachea midline, no masses. Cardiovascular: No clubbing, cyanosis, or edema. Respiratory: Normal respiratory effort, no increased work of breathing. GI: Abdomen is soft, non tender, non distended, no abdominal masses. Liver and spleen not palpable.  No hernias appreciated.  Stool sample for occult testing is not indicated.   GU: No CVA tenderness.  No bladder fullness or masses.  Patient with circumcised phallus.   Urethral meatus is patent.  No penile discharge. No penile lesions or rashes. Scrotum without lesions, cysts, rashes and/or edema.  Testicles are  located scrotally bilaterally. No masses are appreciated in the testicles. Left and right epididymis are normal. Rectal: Patient with  normal sphincter tone. Anus and perineum without scarring or rashes. No rectal masses are appreciated. Prostate is approximately 45 grams, firmness in the right lobe is no longer present, no nodules are appreciated. Seminal vesicles are normal. Skin: No rashes, bruises or suspicious lesions. Lymph: No cervical or inguinal adenopathy. Neurologic: Grossly intact, no focal deficits, moving all 4 extremities. Psychiatric: Normal mood and affect.  Laboratory Data: PSA History  See above  Lab Results  Component Value Date   CREATININE 1.16 09/26/2016       Component Value Date/Time   CHOL 158 09/15/2016 1016   HDL 59 09/15/2016 1016   CHOLHDL 2.7 09/15/2016 1016   LDLCALC 90 09/15/2016 1016    Urinalysis Unremarkable.  See EPIC.   I have reviewed the labs  Pertinent imaging Results for Nathaniel KluverMOORE, Araf DOUGLAS (MRN [id number]) as of 06/07/2017 20:09  Ref. Range 06/05/2017 11:42  Scan Result Unknown 138      Assessment & Plan:    1. History of hematuria  - work up completed in 10/2016 - no malignancies found  2. Increase in PSA velocity  - currently at 0.82  3. Irregular prostate exam  - resolved  4. BPH with LUTS  - IPSS score is 7/2  - Continue conservative management, avoiding bladder irritants and timed voiding's  - most bothersome symptoms is/are frequency and urgency  - Initiate alpha-blocker (tamsulosin 0.4 mg), discussed side effects   - RTC in one month for I PSS and PVR                  Return in about 1 month (around 07/06/2017) for IPSS and PVR.  These notes generated with voice recognition software. I apologize for typographical errors.  Michiel CowboySHANNON Lafern Brinkley, PA-C  [facility] [address], [address] [place], [place] [id number] [phone]

## 2017-06-04 ENCOUNTER — Other Ambulatory Visit: Payer: Self-pay | Admitting: *Deleted

## 2017-06-04 DIAGNOSIS — R972 Elevated prostate specific antigen [PSA]: Secondary | ICD-10-CM

## 2017-06-05 ENCOUNTER — Other Ambulatory Visit
Admission: RE | Admit: 2017-06-05 | Discharge: 2017-06-05 | Disposition: A | Discharge status: Home / Self Care | Payer: PPO | Source: Ambulatory Visit | Attending: Urology | Admitting: Urology

## 2017-06-05 ENCOUNTER — Ambulatory Visit (INDEPENDENT_AMBULATORY_CARE_PROVIDER_SITE_OTHER): Payer: PPO | Admitting: Urology

## 2017-06-05 ENCOUNTER — Encounter: Payer: Self-pay | Admitting: Urology

## 2017-06-05 VITALS — BP 150/70 | HR 55 | Ht 72.0 in | Wt 122.0 lb

## 2017-06-05 DIAGNOSIS — Z87448 Personal history of other diseases of urinary system: Secondary | ICD-10-CM | POA: Diagnosis not present

## 2017-06-05 DIAGNOSIS — R3989 Other symptoms and signs involving the genitourinary system: Secondary | ICD-10-CM | POA: Diagnosis not present

## 2017-06-05 DIAGNOSIS — N138 Other obstructive and reflux uropathy: Secondary | ICD-10-CM | POA: Diagnosis not present

## 2017-06-05 DIAGNOSIS — R972 Elevated prostate specific antigen [PSA]: Secondary | ICD-10-CM | POA: Insufficient documentation

## 2017-06-05 DIAGNOSIS — N401 Enlarged prostate with lower urinary tract symptoms: Secondary | ICD-10-CM

## 2017-06-05 LAB — URINALYSIS, COMPLETE (UACMP) WITH MICROSCOPIC
BACTERIA UA: NONE SEEN
Bilirubin Urine: NEGATIVE
GLUCOSE, UA: NEGATIVE mg/dL
Ketones, ur: NEGATIVE mg/dL
Leukocytes, UA: NEGATIVE
Nitrite: NEGATIVE
PROTEIN: NEGATIVE mg/dL
SPECIFIC GRAVITY, URINE: 1.01 (ref 1.005–1.030)
SQUAMOUS EPITHELIAL / LPF: NONE SEEN
WBC UA: NONE SEEN WBC/hpf (ref 0–5)
pH: 6 (ref 5.0–8.0)

## 2017-06-05 LAB — BLADDER SCAN AMB NON-IMAGING: SCAN RESULT: 138

## 2017-06-05 LAB — PSA: PROSTATIC SPECIFIC ANTIGEN: 0.82 ng/mL (ref 0.00–4.00)

## 2017-06-05 MED ORDER — TAMSULOSIN HCL 0.4 MG PO CAPS: 0.4000 mg | ORAL_CAPSULE | Freq: Every day | ORAL | 3 refills | Status: DC

## 2017-06-06 LAB — URINE CULTURE: Culture: NO GROWTH

## 2017-06-08 ENCOUNTER — Telehealth: Payer: Self-pay

## 2017-06-08 NOTE — Telephone Encounter (Signed)
Spoke to patient. Gave results. Patient verbalized understanding. Confirmed upcoming appt.

## 2017-06-08 NOTE — Telephone Encounter (Signed)
-----   Message from [name], PA-C sent at 06/07/2017  2:50 PM EDT ----- Please let Nathaniel Garcia know that his PSA has returned at a normal level of 0.82. His urine culture was negative.  We will see him in one month.

## 2017-07-09 NOTE — Progress Notes (Signed)
07/10/2017 10:13 AM   Nathaniel Garcia [birth year] [id number]  Referring provider: Duanne Limerick, MD [address] [place], [place] [address]  Chief Complaint  Patient presents with  . Benign Prostatic Hypertrophy    1 month follow up    HPI: 71 yo AAM with a history of hematuria, increase in PSA velocity and irregular prostate gland who presents for a 6 months follow up.  BPH WITH LUTS  (prostate and/or bladder) His IPSS score today is 7, which is mild lower urinary tract symptomatology. He is mostly satisfied with his quality life due to his urinary symptoms.  His PVR is 0 mL.   his previous I PSS score was 7/2.  His previous PVR was 138 mL.  He states the tamsulosin is helping.    His major complaints today are frequency, urgency, nocturia, incontinence and intermittency.  He has had these symptoms for several months.  He denies any dysuria, hematuria or suprapubic pain.   He also denies any recent fevers, chills, nausea or vomiting.  He does not have a family history of PCa.      IPSS    Row Name 06/05/17 1000 07/10/17 1000       International Prostate Symptom Score   How often have you had the sensation of not emptying your bladder? Less than 1 in 5 Not at All    How often have you had to urinate less than every two hours? Less than 1 in 5 times Not at All    How often have you found you stopped and started again several times when you urinated? Less than 1 in 5 times Less than 1 in 5 times    How often have you found it difficult to postpone urination? Less than 1 in 5 times About half the time    How often have you had a weak urinary stream? Less than 1 in 5 times Less than 1 in 5 times    How often have you had to strain to start urination? Not at All Not at All    How many times did you typically get up at night to urinate? 2 Times 2 Times    Total IPSS Score 7 7      Quality of Life due to urinary symptoms   If you were to spend the rest of your  life with your urinary condition just the way it is now how would you feel about that? Mostly Satisfied Mostly Satisfied       Score:  1-7 Mild 8-19 Moderate 20-35 Severe    PMH: Past Medical History:  Diagnosis Date  . COPD (chronic obstructive pulmonary disease) (HCC)   . Hematuria   . Stroke [facility])     Surgical History: Past Surgical History:  Procedure Laterality Date  . COLONOSCOPY  2011   normal- [facility] docs    Home Medications:  Allergies as of 07/10/2017   No Known Allergies     Medication List       Accurate as of 07/10/17 10:13 AM. Always use your most recent med list.          albuterol 108 (90 Base) MCG/ACT inhaler Commonly known as:  PROVENTIL HFA;VENTOLIN HFA Inhale 2 puffs into the lungs every 6 (six) hours as needed for wheezing or shortness of breath.   atorvastatin 80 MG tablet Commonly known as:  LIPITOR Take 1 tablet (80 mg total) by mouth daily.   clopidogrel 75 MG tablet  Commonly known as:  PLAVIX Take 1 tablet (75 mg total) by mouth daily.   clotrimazole-betamethasone cream Commonly known as:  LOTRISONE Apply 1 application topically 2 (two) times daily.   tamsulosin 0.4 MG Caps capsule Commonly known as:  FLOMAX Take 1 capsule (0.4 mg total) by mouth daily.   tiotropium 18 MCG inhalation capsule Commonly known as:  SPIRIVA HANDIHALER Place 1 capsule (18 mcg total) into inhaler and inhale 1 day or 1 dose.            Discharge Care Instructions        Start     Ordered   07/10/17 0000  BLADDER SCAN AMB NON-IMAGING     07/10/17 0954      Allergies: No Known Allergies  Family History: Family History  Problem Relation Age of Onset  . Prostate cancer Neg Hx   . Kidney disease Neg Hx   . Kidney cancer Neg Hx   . Bladder Cancer Neg Hx     Social History:  reports that he has been smoking.  He has never used smokeless tobacco. He reports that he drinks alcohol. He reports that he uses drugs.  ROS: UROLOGY Frequent  Urination?: No Hard to postpone urination?: No Burning/pain with urination?: No Get up at night to urinate?: No Leakage of urine?: No Urine stream starts and stops?: No Trouble starting stream?: No Do you have to strain to urinate?: No Blood in urine?: No Urinary tract infection?: No Sexually transmitted disease?: No Injury to kidneys or bladder?: No Painful intercourse?: No Weak stream?: No Erection problems?: No Penile pain?: No  Gastrointestinal Nausea?: No Vomiting?: No Indigestion/heartburn?: No Diarrhea?: No Constipation?: No  Constitutional Fever: No Night sweats?: No Weight loss?: No Fatigue?: No  Skin Skin rash/lesions?: No Itching?: No  Eyes Blurred vision?: No Double vision?: No  Ears/Nose/Throat Sore throat?: No Sinus problems?: No  Hematologic/Lymphatic Swollen glands?: No Easy bruising?: No  Cardiovascular Leg swelling?: No Chest pain?: No  Respiratory Cough?: No Shortness of breath?: No  Endocrine Excessive thirst?: No  Musculoskeletal Back pain?: No Joint pain?: No  Neurological Headaches?: No Dizziness?: No  Psychologic Depression?: No Anxiety?: No  Physical Exam: BP (!) 142/64   Pulse 62   Ht 6' (1.829 m)   Wt 122 lb (55.3 kg)   BMI 16.55 kg/m   Constitutional: Well nourished. Alert and oriented, No acute distress. HEENT: [place] AT, moist mucus membranes. Trachea midline, no masses. Cardiovascular: No clubbing, cyanosis, or edema. Respiratory: Normal respiratory effort, no increased work of breathing. Skin: No rashes, bruises or suspicious lesions. Lymph: No cervical or inguinal adenopathy. Neurologic: Grossly intact, no focal deficits, moving all 4 extremities. Psychiatric: Normal mood and affect.  Laboratory Data: PSA History  See above  Lab Results  Component Value Date   CREATININE 1.16 09/26/2016       Component Value Date/Time   CHOL 158 09/15/2016 1016   HDL 59 09/15/2016 1016   CHOLHDL 2.7  09/15/2016 1016   LDLCALC 90 09/15/2016 1016    I have reviewed the labs  Pertinent imaging Results for Nathaniel, Garcia (MRN [id number]) as of 07/10/2017 10:09  Ref. Range 07/10/2017 10:04  Scan Result Unknown 0       Assessment & Plan:    1. BPH with LUTS  - IPSS score is 7/2  - Continue conservative management, avoiding bladder irritants and timed voiding's  - most bothersome symptoms is/are frequency and urgency  - continue tamsulosin 0.4 mg; refills given  -  RTC in 12 month for I PSS and PVR                  Return in about 1 year (around 07/10/2018) for IPSS, PVR and exam.  These notes generated with voice recognition software. I apologize for typographical errors.  Michiel Cowboy, PA-C  [facility] [address], [address] [place], [place] [phone]

## 2017-07-10 ENCOUNTER — Ambulatory Visit (INDEPENDENT_AMBULATORY_CARE_PROVIDER_SITE_OTHER): Payer: PPO | Admitting: Urology

## 2017-07-10 ENCOUNTER — Telehealth: Payer: Self-pay | Admitting: Family Medicine

## 2017-07-10 ENCOUNTER — Encounter: Payer: Self-pay | Admitting: Urology

## 2017-07-10 ENCOUNTER — Other Ambulatory Visit: Payer: Self-pay

## 2017-07-10 VITALS — BP 142/64 | HR 62 | Ht 72.0 in | Wt 122.0 lb

## 2017-07-10 DIAGNOSIS — N401 Enlarged prostate with lower urinary tract symptoms: Secondary | ICD-10-CM | POA: Diagnosis not present

## 2017-07-10 DIAGNOSIS — N138 Other obstructive and reflux uropathy: Secondary | ICD-10-CM | POA: Diagnosis not present

## 2017-07-10 LAB — BLADDER SCAN AMB NON-IMAGING: Scan Result: 0

## 2017-07-10 NOTE — Telephone Encounter (Signed)
Pt came in complaining of seeing dots, was offered the 11:00 and 3:00 appt for today and pt didn't want to stay for either one of them stated he wanted to get back to [place]. Pt was advised he did need to get treated for that either by [facility], urgent care or the ER especially if it got worse over the weekend.

## 2017-08-25 ENCOUNTER — Other Ambulatory Visit: Payer: Self-pay

## 2017-11-25 ENCOUNTER — Other Ambulatory Visit: Payer: Self-pay

## 2017-11-25 MED ORDER — TAMSULOSIN HCL 0.4 MG PO CAPS: 0.4000 mg | ORAL_CAPSULE | Freq: Every day | ORAL | 3 refills | Status: DC

## 2017-12-21 ENCOUNTER — Other Ambulatory Visit: Payer: Self-pay

## 2018-06-16 ENCOUNTER — Ambulatory Visit (INDEPENDENT_AMBULATORY_CARE_PROVIDER_SITE_OTHER): Payer: Medicare Other

## 2018-06-16 VITALS — BP 130/62 | HR 60 | Temp 97.7°F | Resp 12 | Ht 72.0 in | Wt 119.8 lb

## 2018-06-16 DIAGNOSIS — Z135 Encounter for screening for eye and ear disorders: Secondary | ICD-10-CM | POA: Diagnosis not present

## 2018-06-16 DIAGNOSIS — Z0000 Encounter for general adult medical examination without abnormal findings: Secondary | ICD-10-CM | POA: Diagnosis not present

## 2018-06-16 NOTE — Patient Instructions (Signed)
[name] , Thank you for taking time to come for your Medicare Wellness Visit. I appreciate your ongoing commitment to your health goals. Please review the following plan we discussed and let me know if I can assist you in the future.   Screening recommendations/referrals: Colorectal Screening: Up to date  Vision and Dental Exams: Recommended annual ophthalmology exams for early detection of glaucoma and other disorders of the eye Recommended annual dental exams for proper oral hygiene  Vaccinations: Influenza vaccine: Overdue Pneumococcal vaccine: Declined Tdap vaccine: Declined. Please call your insurance company to determine your out of pocket expense. You may also receive this vaccine at your local pharmacy or Health Dept. Shingles vaccine: Please call your insurance company to determine your out of pocket expense for the Shingrix vaccine. You may also receive this vaccine at your local pharmacy or Health Dept.  Advanced directives: Advance directive discussed with you today. I have provided a copy for you to complete at home and have notarized. Once this is complete please bring a copy in to our office so we can scan it into your chart.  Goals: Recommend to drink at least 6-8 8oz glasses of water per day.  Next appointment: Please schedule your Annual Wellness Visit with your Nurse Health Advisor in one year.  Preventive Care 9765 Years and Older, Male Preventive care refers to lifestyle choices and visits with your health care provider that can promote health and wellness. What does preventive care include?  A yearly physical exam. This is also called an annual well check.  Dental exams once or twice a year.  Routine eye exams. Ask your health care provider how often you should have your eyes checked.  Personal lifestyle choices, including:  Daily care of your teeth and gums.  Regular physical activity.  Eating a healthy diet.  Avoiding tobacco and drug use.  Limiting  alcohol use.  Practicing safe sex.  Taking low doses of aspirin every day.  Taking vitamin and mineral supplements as recommended by your health care provider. What happens during an annual well check? The services and screenings done by your health care provider during your annual well check will depend on your age, overall health, lifestyle risk factors, and family history of disease. Counseling  Your health care provider may ask you questions about your:  Alcohol use.  Tobacco use.  Drug use.  Emotional well-being.  Home and relationship well-being.  Sexual activity.  Eating habits.  History of falls.  Memory and ability to understand (cognition).  Work and work Astronomerenvironment. Screening  You may have the following tests or measurements:  Height, weight, and BMI.  Blood pressure.  Lipid and cholesterol levels. These may be checked every 5 years, or more frequently if you are over 73 years old.  Skin check.  Lung cancer screening. You may have this screening every year starting at age 73 if you have a 30-pack-year history of smoking and currently smoke or have quit within the past 15 years.  Fecal occult blood test (FOBT) of the stool. You may have this test every year starting at age 73.  Flexible sigmoidoscopy or colonoscopy. You may have a sigmoidoscopy every 5 years or a colonoscopy every 10 years starting at age 73.  Prostate cancer screening. Recommendations will vary depending on your family history and other risks.  Hepatitis C blood test.  Hepatitis B blood test.  Sexually transmitted disease (STD) testing.  Diabetes screening. This is done by checking your blood sugar (glucose)  after you have not eaten for a while (fasting). You may have this done every 1-3 years.  Abdominal aortic aneurysm (AAA) screening. You may need this if you are a current or former smoker.  Osteoporosis. You may be screened starting at age 78 if you are at high risk. Talk  with your health care provider about your test results, treatment options, and if necessary, the need for more tests. Vaccines  Your health care provider may recommend certain vaccines, such as:  Influenza vaccine. This is recommended every year.  Tetanus, diphtheria, and acellular pertussis (Tdap, Td) vaccine. You may need a Td booster every 10 years.  Zoster vaccine. You may need this after age 38.  Pneumococcal 13-valent conjugate (PCV13) vaccine. One dose is recommended after age 48.  Pneumococcal polysaccharide (PPSV23) vaccine. One dose is recommended after age 58. Talk to your health care provider about which screenings and vaccines you need and how often you need them. This information is not intended to replace advice given to you by your health care provider. Make sure you discuss any questions you have with your health care provider. Document Released: 11/30/2015 Document Revised: 07/23/2016 Document Reviewed: 09/04/2015 Elsevier Interactive Patient Education  2017 [employer].  Fall Prevention in the Home Falls can cause injuries. They can happen to people of all ages. There are many things you can do to make your home safe and to help prevent falls. What can I do on the outside of my home?  Regularly fix the edges of walkways and driveways and fix any cracks.  Remove anything that might make you trip as you walk through a door, such as a raised step or threshold.  Trim any bushes or trees on the path to your home.  Use bright outdoor lighting.  Clear any walking paths of anything that might make someone trip, such as rocks or tools.  Regularly check to see if handrails are loose or broken. Make sure that both sides of any steps have handrails.  Any raised decks and porches should have guardrails on the edges.  Have any leaves, snow, or ice cleared regularly.  Use sand or salt on walking paths during winter.  Clean up any spills in your garage right away. This  includes oil or grease spills. What can I do in the bathroom?  Use night lights.  Install grab bars by the toilet and in the tub and shower. Do not use towel bars as grab bars.  Use non-skid mats or decals in the tub or shower.  If you need to sit down in the shower, use a plastic, non-slip stool.  Keep the floor dry. Clean up any water that spills on the floor as soon as it happens.  Remove soap buildup in the tub or shower regularly.  Attach bath mats securely with double-sided non-slip rug tape.  Do not have throw rugs and other things on the floor that can make you trip. What can I do in the bedroom?  Use night lights.  Make sure that you have a light by your bed that is easy to reach.  Do not use any sheets or blankets that are too big for your bed. They should not hang down onto the floor.  Have a firm chair that has side arms. You can use this for support while you get dressed.  Do not have throw rugs and other things on the floor that can make you trip. What can I do in the kitchen?  Clean up any spills right away.  Avoid walking on wet floors.  Keep items that you use a lot in easy-to-reach places.  If you need to reach something above you, use a strong step stool that has a grab bar.  Keep electrical cords out of the way.  Do not use floor polish or wax that makes floors slippery. If you must use wax, use non-skid floor wax.  Do not have throw rugs and other things on the floor that can make you trip. What can I do with my stairs?  Do not leave any items on the stairs.  Make sure that there are handrails on both sides of the stairs and use them. Fix handrails that are broken or loose. Make sure that handrails are as long as the stairways.  Check any carpeting to make sure that it is firmly attached to the stairs. Fix any carpet that is loose or worn.  Avoid having throw rugs at the top or bottom of the stairs. If you do have throw rugs, attach them to the  floor with carpet tape.  Make sure that you have a light switch at the top of the stairs and the bottom of the stairs. If you do not have them, ask someone to add them for you. What else can I do to help prevent falls?  Wear shoes that:  Do not have high heels.  Have rubber bottoms.  Are comfortable and fit you well.  Are closed at the toe. Do not wear sandals.  If you use a stepladder:  Make sure that it is fully opened. Do not climb a closed stepladder.  Make sure that both sides of the stepladder are locked into place.  Ask someone to hold it for you, if possible.  Clearly mark and make sure that you can see:  Any grab bars or handrails.  First and last steps.  Where the edge of each step is.  Use tools that help you move around (mobility aids) if they are needed. These include:  Canes.  Walkers.  Scooters.  Crutches.  Turn on the lights when you go into a dark area. Replace any light bulbs as soon as they burn out.  Set up your furniture so you have a clear path. Avoid moving your furniture around.  If any of your floors are uneven, fix them.  If there are any pets around you, be aware of where they are.  Review your medicines with your doctor. Some medicines can make you feel dizzy. This can increase your chance of falling. Ask your doctor what other things that you can do to help prevent falls. This information is not intended to replace advice given to you by your health care provider. Make sure you discuss any questions you have with your health care provider. Document Released: 08/30/2009 Document Revised: 04/10/2016 Document Reviewed: 12/08/2014 Elsevier Interactive Patient Education  2017 [employer].

## 2018-06-16 NOTE — Progress Notes (Signed)
Subjective:   Nathaniel Garcia is a 72 y.o. male who presents for an Initial Medicare Annual Wellness Visit.  Review of Systems  N/A Cardiac Risk Factors include: smoking/ tobacco exposure;sedentary lifestyle;advanced age (>55men, >2 women);male gender;dyslipidemia    Objective:    Today's Vitals   06/16/18 1133  BP: 130/62  Pulse: 60  Resp: 12  Temp: 97.7 F (36.5 C)  TempSrc: Oral  SpO2: 98%  Weight: 119 lb 12.8 oz (54.3 kg)  Height: 6' (1.829 m)   Body mass index is 16.25 kg/m.  Advanced Directives 06/16/2018 07/18/2015  Does Patient Have a Medical Advance Directive? No No  Would patient like information on creating a medical advance directive? Yes (MAU/Ambulatory/Procedural Areas - Information given) No - patient declined information    Current Medications (verified) Outpatient Encounter Medications as of 06/16/2018  Medication Sig  . albuterol (PROVENTIL HFA;VENTOLIN HFA) 108 (90 Base) MCG/ACT inhaler Inhale 2 puffs into the lungs every 6 (six) hours as needed for wheezing or shortness of breath.  . clopidogrel (PLAVIX) 75 MG tablet Take 1 tablet (75 mg total) by mouth daily.  Marland Kitchen tiotropium (SPIRIVA HANDIHALER) 18 MCG inhalation capsule Place 1 capsule (18 mcg total) into inhaler and inhale 1 day or 1 dose.  Marland Kitchen atorvastatin (LIPITOR) 80 MG tablet Take 1 tablet (80 mg total) by mouth daily. (Patient not taking: Reported on 06/05/2017)  . clotrimazole-betamethasone (LOTRISONE) cream Apply 1 application topically 2 (two) times daily. (Patient not taking: Reported on 06/05/2017)  . tamsulosin (FLOMAX) 0.4 MG CAPS capsule Take 1 capsule (0.4 mg total) by mouth daily. (Patient not taking: Reported on 06/16/2018)   No facility-administered encounter medications on file as of 06/16/2018.     Allergies (verified) Patient has no known allergies.   History: Past Medical History:  Diagnosis Date  . COPD (chronic obstructive pulmonary disease) (HCC)   . Hematuria   .  Hyperlipidemia   . Stroke [facility])    Past Surgical History:  Procedure Laterality Date  . COLONOSCOPY  2011   normal- [facility] docs   Family History  Family history unknown: Yes   Social History   Socioeconomic History  . Marital status: Legally Separated    Spouse name: Not on file  . Number of children: 3  . Years of education: some college  . Highest education level: 12th grade  Occupational History  . Occupation: Retired  Engineer, production  . Financial resource strain: Not hard at all  . Food insecurity:    Worry: Never true    Inability: Never true  . Transportation needs:    Medical: No    Non-medical: No  Tobacco Use  . Smoking status: Current Some Day Smoker    Packs/day: 0.25    Years: 60.00    Pack years: 15.00    Types: Cigarettes    Last attempt to quit: 07/19/2015    Years since quitting: 2.9  . Smokeless tobacco: Never Used  Substance and Sexual Activity  . Alcohol use: Yes    Alcohol/week: 0.6 oz    Types: 1 Cans of beer per week  . Drug use: Yes    Comment: marijuana  . Sexual activity: Never  Lifestyle  . Physical activity:    Days per week: 0 days    Minutes per session: 0 min  . Stress: Not at all  Relationships  . Social connections:    Talks on phone: Patient refused    Gets together: Patient refused    Attends  religious service: Patient refused    Active member of club or organization: Patient refused    Attends meetings of clubs or organizations: Patient refused    Relationship status: Separated  Other Topics Concern  . Not on file  Social History Narrative  . Not on file   Tobacco Counseling Ready to quit: Yes Counseling given: Yes  Clinical Intake:  Pre-visit preparation completed: Yes  Pain : No/denies pain   BMI - recorded: 16.25 Nutritional Status: BMI <19  Underweight Nutritional Risks: None Diabetes: No  How often do you need to have someone help you when you read instructions, pamphlets, or other written materials from your  doctor or pharmacy?: 1 - Never  Interpreter Needed?: No  Information entered by :: AEversole, LPN  Activities of Daily Living In your present state of health, do you have any difficulty performing the following activities: 06/16/2018  Hearing? N  Comment denies hearing aids  Vision? N  Comment wears eyeglasses  Difficulty concentrating or making decisions? N  Walking or climbing stairs? N  Dressing or bathing? N  Doing errands, shopping? N  Preparing Food and eating ? N  Comment full set upper dentures  Using the Toilet? N  In the past six months, have you accidently leaked urine? Y  Comment urgency  Do you have problems with loss of bowel control? N  Managing your Medications? N  Managing your Finances? N  Housekeeping or managing your Housekeeping? N  Some recent data might be hidden     Immunizations and Health Maintenance  There is no immunization history on file for this patient. There are no preventive care reminders to display for this patient.  Patient Care Team: Duanne Limerick, MD as PCP - General (Family Medicine) Hulan Fray as Physician Assistant (Urology) Mertie Moores, MD as Consulting Physician (Specialist)  Indicate any recent Medical Services you may have received from other than [facility] providers in the past year (date may be approximate).    Assessment:   This is a routine wellness examination for Nathaniel Garcia.  Hearing/Vision screen Vision Screening Comments: Not established with a provider for annual eye exams. Referral placed for pt to establish care with [facility]   Dietary issues and exercise activities discussed: Current Exercise Habits: The patient does not participate in regular exercise at present, Exercise limited by: None identified  Goals    . DIET - INCREASE WATER INTAKE     Recommend to drink at least 6-8 8oz glasses of water per day.      Depression Screen PHQ 2/9 Scores 06/16/2018 02/12/2016 01/03/2016  07/18/2015  PHQ - 2 Score 0 0 0 1  PHQ- 9 Score 0 - - -    Fall Risk Fall Risk  06/16/2018 02/12/2016 01/03/2016 07/18/2015  Falls in the past year? No No No No  Risk for fall due to : Impaired vision;Other (Comment) - - -  Risk for fall due to: Comment wears eyeglasses; ilicit drug use - - -    FALL RISK PREVENTION PERTAINING TO HOME: Is your home free of loose throw rugs in walkways, pet beds, electrical cords, etc? Yes Is there adequate lighting in your home to reduce risk of falls?  Yes Are there stairs in or around your home WITH handrails? Yes  ASSISTIVE DEVICES UTILIZED TO PREVENT FALLS: Use of a cane, walker or w/c? No Grab bars in the bathroom? Yes  Shower chair or a place to sit while bathing? No An elevated  toilet seat or a handicapped toilet? No  Timed Get Up and Go Performed: Yes. Pt ambulated 10 feet within 8 sec. Gait stead-fast and without the use of an assistive device. No intervention required at this time. Fall risk prevention has been discussed.  Community Resource Referral:  Pt declined my offer to send [employer] Resource Referral to Care Guide for a shower chair or an elevated toilet seat.  Cognitive Function:     6CIT Screen 06/16/2018  What Year? 0 points  What month? 0 points  What time? 0 points  Count back from 20 0 points  Months in reverse 0 points  Repeat phrase 0 points  Total Score 0    Screening Tests Health Maintenance  Topic Date Due  . TETANUS/TDAP  06/17/2019 (Originally 06/21/1965)  . Hepatitis C Screening  06/17/2019 (Originally 08/03/1946)  . PNA vac Low Risk Adult (1 of 2 - PCV13) 06/17/2019 (Originally 06/21/2010)  . INFLUENZA VACCINE  06/17/2018  . COLONOSCOPY  11/17/2020    Qualifies for Shingles Vaccine? Yes. Due for Shingrix. Education has been provided regarding the importance of this vaccine. Pt has been advised to call insurance company to determine out of pocket expense. Advised may also receive vaccine at local pharmacy or  Health Dept. Verbalized acceptance and understanding.  Overdue for Flu vaccine. Education has been provided regarding the importance of this vaccine and advised to receive when available. Verbalized acceptance and understanding.  Due for Pneumoccocal vaccine. Declined my offer to administer today. Education has been provided regarding the importance of this vaccine but still declined. Advised may receive this vaccine at local pharmacy or Health Dept. Aware to provide a copy of the vaccination record if obtained from local pharmacy or Health Dept. Verbalized acceptance and understanding.  Due for Tdap vaccine. Education has been provided regarding the importance of this vaccine. Advised may receive this vaccine at local pharmacy or Health Dept. Aware to provide a copy of the vaccination record if obtained from local pharmacy or Health Dept. Verbalized acceptance and understanding.   Cancer Screenings: Lung: Low Dose CT Chest recommended if Age 63-80 years, 30 pack-year currently smoking OR have quit w/in 15years. Patient does not qualify. Colorectal: No longer required  Additional Screenings: Hepatitis C Screening: Declined    Plan:  I have personally reviewed and addressed the Medicare Annual Wellness questionnaire and have noted the following in the patient's chart:  A. Medical and social history B. Use of alcohol, tobacco or illicit drugs  C. Current medications and supplements D. Functional ability and status E.  Nutritional status F.  Physical activity G. Advance directives H. List of other physicians I.  Hospitalizations, surgeries, and ER visits in previous 12 months J.  Vitals K. Screenings such as hearing and vision if needed, cognitive and depression L. Referrals and appointments  In addition, I have reviewed and discussed with patient certain preventive protocols, quality metrics, and best practice recommendations. A written personalized care plan for preventive services as  well as general preventive health recommendations were provided to patient.  Signed,  Deon PillingAmmie Geneva Barrero, LPN Nurse Health Advisor  MD Recommendations: Due for Shingrix. Education has been provided regarding the importance of this vaccine. Pt has been advised to call insurance company to determine out of pocket expense. Advised may also receive vaccine at local pharmacy or Health Dept. Verbalized acceptance and understanding.  Overdue for Flu vaccine. Education has been provided regarding the importance of this vaccine and advised to receive when available. Verbalized acceptance and  understanding.  Due for Pneumoccocal vaccine. Declined my offer to administer today. Education has been provided regarding the importance of this vaccine but still declined. Advised may receive this vaccine at local pharmacy or Health Dept. Aware to provide a copy of the vaccination record if obtained from local pharmacy or Health Dept. Verbalized acceptance and understanding.  Due for Tdap vaccine. Education has been provided regarding the importance of this vaccine. Advised may receive this vaccine at local pharmacy or Health Dept. Aware to provide a copy of the vaccination record if obtained from local pharmacy or Health Dept. Verbalized acceptance and understanding.  Due for Hep C screening but declined.  Referral placed for pt to establish care with [facility] for annual eye exams.

## 2018-06-29 ENCOUNTER — Ambulatory Visit (INDEPENDENT_AMBULATORY_CARE_PROVIDER_SITE_OTHER): Payer: Medicare Other | Admitting: Family Medicine

## 2018-06-29 ENCOUNTER — Encounter: Payer: Self-pay | Admitting: Family Medicine

## 2018-06-29 VITALS — BP 120/80 | HR 60 | Ht 72.0 in | Wt 117.0 lb

## 2018-06-29 DIAGNOSIS — J432 Centrilobular emphysema: Secondary | ICD-10-CM | POA: Diagnosis not present

## 2018-06-29 DIAGNOSIS — F1099 Alcohol use, unspecified with unspecified alcohol-induced disorder: Secondary | ICD-10-CM

## 2018-06-29 DIAGNOSIS — I6523 Occlusion and stenosis of bilateral carotid arteries: Secondary | ICD-10-CM

## 2018-06-29 DIAGNOSIS — I679 Cerebrovascular disease, unspecified: Secondary | ICD-10-CM | POA: Diagnosis not present

## 2018-06-29 DIAGNOSIS — N400 Enlarged prostate without lower urinary tract symptoms: Secondary | ICD-10-CM | POA: Diagnosis not present

## 2018-06-29 DIAGNOSIS — R634 Abnormal weight loss: Secondary | ICD-10-CM

## 2018-06-29 DIAGNOSIS — E785 Hyperlipidemia, unspecified: Secondary | ICD-10-CM | POA: Diagnosis not present

## 2018-06-29 DIAGNOSIS — I700 Atherosclerosis of aorta: Secondary | ICD-10-CM

## 2018-06-29 DIAGNOSIS — F17200 Nicotine dependence, unspecified, uncomplicated: Secondary | ICD-10-CM

## 2018-06-29 MED ORDER — CLOPIDOGREL BISULFATE 75 MG PO TABS: 75.0000 mg | ORAL_TABLET | Freq: Every day | ORAL | 6 refills | Status: DC

## 2018-06-29 MED ORDER — TAMSULOSIN HCL 0.4 MG PO CAPS: 0.4000 mg | ORAL_CAPSULE | Freq: Every day | ORAL | 3 refills | Status: DC

## 2018-06-29 MED ORDER — ATORVASTATIN CALCIUM 80 MG PO TABS: 80.0000 mg | ORAL_TABLET | Freq: Every day | ORAL | 6 refills | Status: DC

## 2018-06-29 NOTE — Assessment & Plan Note (Addendum)
Restart lipitor 80 mg daily.  and draw lipid panel

## 2018-06-29 NOTE — Progress Notes (Signed)
Name: Nathaniel Garcia   MRN: [id number]    DOB: 09/07/1946   Date:06/29/2018       Progress Note  Subjective  Chief Complaint  Chief Complaint  Patient presents with  . Urinary Incontinence    has been out of Tamsulosin  . Hyperlipidemia    needs to get back on chol med    Hyperlipidemia  This is a chronic (stopped medication) problem. The current episode started more than 1 year ago. The problem is uncontrolled. Recent lipid tests were reviewed and are low. Exacerbating diseases include liver disease. He has no history of chronic renal disease, hypothyroidism, obesity or nephrotic syndrome. Pertinent negatives include no chest pain, focal sensory loss, focal weakness, leg pain, myalgias or shortness of breath. Current antihyperlipidemic treatment includes statins (patient has stopped atorvastatin). The current treatment provides moderate improvement of lipids. There are no compliance problems.   Neurologic Problem  The patient's pertinent negatives include no altered mental status, clumsiness, focal sensory loss, focal weakness, loss of balance, memory loss, near-syncope, slurred speech, visual change or weakness. Primary symptoms comment: for cerebral vascular dis. This is a chronic problem. The neurological problem developed gradually. The problem has been gradually improving since onset. There was no focality noted. Pertinent negatives include no chest pain, nausea, shortness of breath or vomiting. The treatment provided moderate relief. His past medical history is significant for a CVA and liver disease. There is no history of a bleeding disorder or a clotting disorder.  Benign Prostatic Hypertrophy  This is a chronic (incontinence) problem. The problem has been waxing and waning since onset. Irritative symptoms include frequency. Obstructive symptoms include dribbling and incomplete emptying. Pertinent negatives include no chills, dysuria, genital pain, hematuria, hesitancy, nausea or  vomiting. Past treatments include tamsulosin. The treatment provided moderate relief.    Cerebral vascular disease Refilled clopidogrel and restart lipitor/ draw lipid  Centriacinar emphysema Chronic- scheduled to see Dr Meredeth IdeFleming on August 22 @ 3:15 in [place]  HLD (hyperlipidemia) Restart lipitor 80 mg daily.  and draw lipid panel   Past Medical History:  Diagnosis Date  . COPD (chronic obstructive pulmonary disease) (HCC)   . Hematuria   . Hyperlipidemia   . Stroke [facility](HCC)     Past Surgical History:  Procedure Laterality Date  . COLONOSCOPY  2011   normal- MD docs    Family History  Family history unknown: Yes    Social History   Socioeconomic History  . Marital status: Legally Separated    Spouse name: Not on file  . Number of children: 3  . Years of education: some college  . Highest education level: 12th grade  Occupational History  . Occupation: Retired  Engineer, productionocial Needs  . Financial resource strain: Not hard at all  . Food insecurity:    Worry: Never true    Inability: Never true  . Transportation needs:    Medical: No    Non-medical: No  Tobacco Use  . Smoking status: Current Some Day Smoker    Packs/day: 0.25    Years: 60.00    Pack years: 15.00    Types: Cigarettes    Last attempt to quit: 07/19/2015    Years since quitting: 2.9  . Smokeless tobacco: Never Used  Substance and Sexual Activity  . Alcohol use: Yes    Alcohol/week: 1.0 standard drinks    Types: 1 Cans of beer per week  . Drug use: Yes    Comment: marijuana  . Sexual activity: Never  Lifestyle  .  Physical activity:    Days per week: 0 days    Minutes per session: 0 min  . Stress: Not at all  Relationships  . Social connections:    Talks on phone: Patient refused    Gets together: Patient refused    Attends religious service: Patient refused    Active member of club or organization: Patient refused    Attends meetings of clubs or organizations: Patient refused    Relationship  status: Separated  . Intimate partner violence:    Fear of current or ex partner: No    Emotionally abused: No    Physically abused: No    Forced sexual activity: No  Other Topics Concern  . Not on file  Social History Narrative  . Not on file    No Known Allergies  Outpatient Medications Prior to Visit  Medication Sig Dispense Refill  . albuterol (PROVENTIL HFA;VENTOLIN HFA) 108 (90 Base) MCG/ACT inhaler Inhale 2 puffs into the lungs every 6 (six) hours as needed for wheezing or shortness of breath. 1 Inhaler 11  . tiotropium (SPIRIVA HANDIHALER) 18 MCG inhalation capsule Place 1 capsule (18 mcg total) into inhaler and inhale 1 day or 1 dose. 30 capsule 12  . clopidogrel (PLAVIX) 75 MG tablet Take 1 tablet (75 mg total) by mouth daily. 30 tablet 6  . clotrimazole-betamethasone (LOTRISONE) cream Apply 1 application topically 2 (two) times daily. (Patient not taking: Reported on 06/05/2017) 30 g 2  . atorvastatin (LIPITOR) 80 MG tablet Take 1 tablet (80 mg total) by mouth daily. (Patient not taking: Reported on 06/05/2017) 30 tablet 11  . tamsulosin (FLOMAX) 0.4 MG CAPS capsule Take 1 capsule (0.4 mg total) by mouth daily. (Patient not taking: Reported on 06/16/2018) 90 capsule 3   No facility-administered medications prior to visit.     Review of Systems  Constitutional: Negative for chills.  Respiratory: Negative for shortness of breath.   Cardiovascular: Negative for chest pain and near-syncope.  Gastrointestinal: Negative for nausea and vomiting.  Genitourinary: Positive for frequency and incomplete emptying. Negative for dysuria, hematuria and hesitancy.  Musculoskeletal: Negative for myalgias.  Neurological: Negative for focal weakness, weakness and loss of balance.  Psychiatric/Behavioral: Negative for memory loss.     Objective  Vitals:   06/29/18 0936  BP: 120/80  Pulse: 60  Weight: 117 lb (53.1 kg)  Height: 6' (1.829 m)    Physical Exam  Constitutional: He is  oriented to person, place, and time.  HENT:  Head: Normocephalic.  Right Ear: External ear normal.  Left Ear: External ear normal.  Nose: Nose normal.  Mouth/Throat: Oropharynx is clear and moist.  Eyes: Pupils are equal, round, and reactive to light. Conjunctivae and EOM are normal. Right eye exhibits no discharge. Left eye exhibits no discharge. No scleral icterus.  Neck: Normal range of motion. Neck supple. No JVD present. No tracheal deviation present. No thyromegaly present.  Cardiovascular: Normal rate, regular rhythm, normal heart sounds and intact distal pulses. Exam reveals no gallop and no friction rub.  No murmur heard. Pulmonary/Chest: Breath sounds normal. No respiratory distress. He has no wheezes. He has no rales.  Abdominal: Soft. Bowel sounds are normal. He exhibits no mass. There is no hepatosplenomegaly. There is no tenderness. There is no rebound, no guarding and no CVA tenderness.  Musculoskeletal: Normal range of motion. He exhibits no edema or tenderness.  Lymphadenopathy:    He has no cervical adenopathy.  Neurological: He is alert and oriented to person, place,  and time. He has normal strength and normal reflexes. No cranial nerve deficit.  Skin: Skin is warm. No rash noted.  Nursing note and vitals reviewed.     Assessment & Plan  Problem List Items Addressed This Visit      Cardiovascular and Mediastinum   Cerebral vascular disease - Primary    Refilled clopidogrel and restart lipitor/ draw lipid      Relevant Medications   clopidogrel (PLAVIX) 75 MG tablet   atorvastatin (LIPITOR) 80 MG tablet     Respiratory   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HCC)    Chronic- scheduled to see Dr Meredeth Ide on August 22 @ 3:15 in [place]        Other   HLD (hyperlipidemia)    Restart lipitor 80 mg daily.  and draw lipid panel      Relevant Medications   atorvastatin (LIPITOR) 80 MG tablet   Other Relevant Orders   Lipid panel    Other Visit Diagnoses    Prostate  hypertrophy       Patient has sxs suggesting overflow incontinence. Tamulosin refilled 0.4 daily. Urology consult scedule sept 13.   Relevant Medications   tamsulosin (FLOMAX) 0.4 MG CAPS capsule   Aortic atherosclerosis (HCC)       Chronic Stable. Continue Plavix 75 mg daily. Encourage smoking cessation.   Relevant Medications   atorvastatin (LIPITOR) 80 MG tablet   Other Relevant Orders   Renal Function Panel   Tobacco dependence       Alcohol use with alcohol-induced disorder (HCC)   (Chronic)     chronic Encourage moderation.   Atherosclerosis of both carotid arteries       Continue plavix/cholesterol controlled   Relevant Medications   atorvastatin (LIPITOR) 80 MG tablet   Weight loss       Relevant Orders   Renal Function Panel      Meds ordered this encounter  Medications  . DISCONTD: atorvastatin (LIPITOR) 80 MG tablet    Sig: Take 1 tablet (80 mg total) by mouth daily.    Dispense:  30 tablet    Refill:  6  . DISCONTD: tamsulosin (FLOMAX) 0.4 MG CAPS capsule    Sig: Take 1 capsule (0.4 mg total) by mouth daily.    Dispense:  90 capsule    Refill:  3  . DISCONTD: clopidogrel (PLAVIX) 75 MG tablet    Sig: Take 1 tablet (75 mg total) by mouth daily.    Dispense:  30 tablet    Refill:  6  . clopidogrel (PLAVIX) 75 MG tablet    Sig: Take 1 tablet (75 mg total) by mouth daily.    Dispense:  30 tablet    Refill:  6  . atorvastatin (LIPITOR) 80 MG tablet    Sig: Take 1 tablet (80 mg total) by mouth daily.    Dispense:  30 tablet    Refill:  6  . tamsulosin (FLOMAX) 0.4 MG CAPS capsule    Sig: Take 1 capsule (0.4 mg total) by mouth daily.    Dispense:  90 capsule    Refill:  3      Dr. Elizabeth Sauer [facility]  06/29/18

## 2018-06-29 NOTE — Assessment & Plan Note (Signed)
Refilled clopidogrel and restart lipitor/ draw lipid

## 2018-06-29 NOTE — Assessment & Plan Note (Signed)
Chronic- scheduled to see Dr Meredeth IdeFleming on August 22 @ 3:15 in [place]

## 2018-06-30 LAB — RENAL FUNCTION PANEL
ALBUMIN: 4.5 g/dL (ref 3.5–4.8)
BUN/Creatinine Ratio: 10 (ref 10–24)
BUN: 11 mg/dL (ref 8–27)
CHLORIDE: 103 mmol/L (ref 96–106)
CO2: 21 mmol/L (ref 20–29)
Calcium: 9.8 mg/dL (ref 8.6–10.2)
Creatinine, Ser: 1.06 mg/dL (ref 0.76–1.27)
GFR calc non Af Amer: 69 mL/min/{1.73_m2} (ref >59)
GFR, EST AFRICAN AMERICAN: 80 mL/min/{1.73_m2} (ref >59)
Glucose: 79 mg/dL (ref 65–99)
PHOSPHORUS: 2.9 mg/dL (ref 2.5–4.5)
Potassium: 5.5 mmol/L — ABNORMAL HIGH (ref 3.5–5.2)
Sodium: 142 mmol/L (ref 134–144)

## 2018-06-30 LAB — LIPID PANEL
CHOLESTEROL TOTAL: 177 mg/dL (ref 100–199)
Chol/HDL Ratio: 3.3 ratio (ref 0.0–5.0)
HDL: 54 mg/dL (ref >39)
LDL Calculated: 110 mg/dL — ABNORMAL HIGH (ref 0–99)
Triglycerides: 64 mg/dL (ref 0–149)
VLDL CHOLESTEROL CAL: 13 mg/dL (ref 5–40)

## 2018-07-06 ENCOUNTER — Telehealth: Payer: Self-pay

## 2018-07-06 NOTE — Telephone Encounter (Signed)
Returned call to inform pt to go to ER for further eval. He called complaining of "severe diarrhea yesterday" and "feeling very tired today, can't walk hardly no distance without giving out." I had to leave a voicemail due to no answer.

## 2018-07-08 DIAGNOSIS — J432 Centrilobular emphysema: Secondary | ICD-10-CM | POA: Diagnosis not present

## 2018-07-08 DIAGNOSIS — R0609 Other forms of dyspnea: Secondary | ICD-10-CM | POA: Diagnosis not present

## 2018-07-08 DIAGNOSIS — D638 Anemia in other chronic diseases classified elsewhere: Secondary | ICD-10-CM | POA: Diagnosis not present

## 2018-07-09 ENCOUNTER — Ambulatory Visit: Payer: PPO | Admitting: Urology

## 2018-07-20 ENCOUNTER — Other Ambulatory Visit: Payer: Self-pay | Admitting: Specialist

## 2018-07-20 DIAGNOSIS — R0609 Other forms of dyspnea: Principal | ICD-10-CM

## 2018-07-23 ENCOUNTER — Other Ambulatory Visit: Payer: Self-pay | Admitting: Specialist

## 2018-07-23 DIAGNOSIS — R634 Abnormal weight loss: Secondary | ICD-10-CM

## 2018-07-23 DIAGNOSIS — R0609 Other forms of dyspnea: Principal | ICD-10-CM

## 2018-07-23 DIAGNOSIS — H2513 Age-related nuclear cataract, bilateral: Secondary | ICD-10-CM | POA: Diagnosis not present

## 2018-07-28 ENCOUNTER — Ambulatory Visit
Admission: RE | Admit: 2018-07-28 | Discharge: 2018-07-28 | Disposition: A | Discharge status: Home / Self Care | Payer: Medicare Other | Source: Ambulatory Visit | Attending: Specialist | Admitting: Specialist

## 2018-07-28 DIAGNOSIS — R634 Abnormal weight loss: Secondary | ICD-10-CM | POA: Diagnosis present

## 2018-07-28 DIAGNOSIS — J432 Centrilobular emphysema: Secondary | ICD-10-CM | POA: Insufficient documentation

## 2018-07-28 DIAGNOSIS — R0609 Other forms of dyspnea: Secondary | ICD-10-CM | POA: Diagnosis not present

## 2018-07-28 DIAGNOSIS — R918 Other nonspecific abnormal finding of lung field: Secondary | ICD-10-CM | POA: Diagnosis not present

## 2018-07-28 DIAGNOSIS — K5730 Diverticulosis of large intestine without perforation or abscess without bleeding: Secondary | ICD-10-CM | POA: Insufficient documentation

## 2018-07-30 ENCOUNTER — Ambulatory Visit: Payer: Medicare Other | Admitting: Urology

## 2018-07-30 NOTE — Progress Notes (Deleted)
07/30/2018 8:12 AM   Nathaniel Garcia 05/17/1946 [id number]  Referring provider: Duanne LimerickJones, Deanna C, MD [address] [place], [place] [address]  No chief complaint on file.   HPI: 72 yo AAM with a history of hematuria, increase in PSA velocity and irregular prostate gland who presents for a 6 months follow up.  BPH WITH LUTS  (prostate and/or bladder) His IPSS score today is ***, which is *** lower urinary tract symptomatology. He is *** with his quality life due to his urinary symptoms.  His PVR is 0 mL.   his previous I PSS score was 7/2.  His previous PVR was 0 mL.  He states the tamsulosin is helping.    His major complaints today are frequency, urgency, nocturia, incontinence and intermittency.  He has had these symptoms for several months.  He denies any dysuria, hematuria or suprapubic pain.   He also denies any recent fevers, chills, nausea or vomiting.  He does not have a family history of PCa.    Score:  1-7 Mild 8-19 Moderate 20-35 Severe    PMH: Past Medical History:  Diagnosis Date  . COPD (chronic obstructive pulmonary disease) (HCC)   . Hematuria   . Hyperlipidemia   . Stroke [facility](HCC)     Surgical History: Past Surgical History:  Procedure Laterality Date  . COLONOSCOPY  2011   normal- MD docs    Home Medications:  Allergies as of 07/30/2018   No Known Allergies     Medication List        Accurate as of 07/30/18  8:12 AM. Always use your most recent med list.          albuterol 108 (90 Base) MCG/ACT inhaler Commonly known as:  PROVENTIL HFA;VENTOLIN HFA Inhale 2 puffs into the lungs every 6 (six) hours as needed for wheezing or shortness of breath.   atorvastatin 80 MG tablet Commonly known as:  LIPITOR Take 1 tablet (80 mg total) by mouth daily.   clopidogrel 75 MG tablet Commonly known as:  PLAVIX Take 1 tablet (75 mg total) by mouth daily.   clotrimazole-betamethasone cream Commonly known as:  LOTRISONE Apply 1  application topically 2 (two) times daily.   tamsulosin 0.4 MG Caps capsule Commonly known as:  FLOMAX Take 1 capsule (0.4 mg total) by mouth daily.   tiotropium 18 MCG inhalation capsule Commonly known as:  SPIRIVA Place 1 capsule (18 mcg total) into inhaler and inhale 1 day or 1 dose.       Allergies: No Known Allergies  Family History: Family History  Family history unknown: Yes    Social History:  reports that he has been smoking cigarettes. He has a 15.00 pack-year smoking history. He has never used smokeless tobacco. He reports that he drinks about 1.0 standard drinks of alcohol per week. He reports that he has current or past drug history.  ROS:                                        Physical Exam: There were no vitals taken for this visit.  Constitutional: Well nourished. Alert and oriented, No acute distress. HEENT: [place] AT, moist mucus membranes. Trachea midline, no masses. Cardiovascular: No clubbing, cyanosis, or edema. Respiratory: Normal respiratory effort, no increased work of breathing. GI: Abdomen is soft, non tender, non distended, no abdominal masses. Liver and spleen not palpable.  No hernias appreciated.  Stool sample for occult testing is not indicated.   GU: No CVA tenderness.  No bladder fullness or masses.  Patient with circumcised/uncircumcised phallus. ***Foreskin easily retracted***  Urethral meatus is patent.  No penile discharge. No penile lesions or rashes. Scrotum without lesions, cysts, rashes and/or edema.  Testicles are located scrotally bilaterally. No masses are appreciated in the testicles. Left and right epididymis are normal. Rectal: Patient with  normal sphincter tone. Anus and perineum without scarring or rashes. No rectal masses are appreciated. Prostate is approximately *** grams, *** nodules are appreciated. Seminal vesicles are normal. Skin: No rashes, bruises or suspicious lesions. Lymph: No cervical or inguinal  adenopathy. Neurologic: Grossly intact, no focal deficits, moving all 4 extremities. Psychiatric: Normal mood and affect.   Laboratory Data: PSA History  1.0 in 05/2014  0.9 in 02/2016  2.0 in 08/2016  0.82 in 05/2017  0.88 in 11/2017   Lab Results  Component Value Date   CREATININE 1.06 06/29/2018       Component Value Date/Time   CHOL 177 06/29/2018 1043   HDL 54 06/29/2018 1043   CHOLHDL 3.3 06/29/2018 1043   LDLCALC 110 (H) 06/29/2018 1043    I have reviewed the labs  Pertinent imaging ***      Assessment & Plan:    1. BPH with LUTS  - IPSS score is 7/2  - Continue conservative management, avoiding bladder irritants and timed voiding's  - most bothersome symptoms is/are frequency and urgency  - continue tamsulosin 0.4 mg; refills given  - RTC in 12 month for I PSS and PVR                  No follow-ups on file.  These notes generated with voice recognition software. I apologize for typographical errors.  Michiel Cowboy, PA-C  [facility] [address] [place], [place] [address] [phone]

## 2018-08-06 ENCOUNTER — Ambulatory Visit: Payer: Medicare Other | Admitting: Family Medicine

## 2018-08-06 ENCOUNTER — Encounter: Payer: Self-pay | Admitting: Urology

## 2018-08-06 ENCOUNTER — Ambulatory Visit (INDEPENDENT_AMBULATORY_CARE_PROVIDER_SITE_OTHER): Payer: Medicare Other | Admitting: Urology

## 2018-08-06 ENCOUNTER — Other Ambulatory Visit
Admission: RE | Admit: 2018-08-06 | Discharge: 2018-08-06 | Disposition: A | Discharge status: Home / Self Care | Payer: Medicare Other | Source: Ambulatory Visit | Attending: Urology | Admitting: Urology

## 2018-08-06 VITALS — BP 145/66 | HR 65 | Ht 73.0 in | Wt 117.0 lb

## 2018-08-06 DIAGNOSIS — Z87448 Personal history of other diseases of urinary system: Secondary | ICD-10-CM

## 2018-08-06 DIAGNOSIS — N3943 Post-void dribbling: Secondary | ICD-10-CM | POA: Diagnosis not present

## 2018-08-06 DIAGNOSIS — N401 Enlarged prostate with lower urinary tract symptoms: Secondary | ICD-10-CM | POA: Diagnosis not present

## 2018-08-06 DIAGNOSIS — N400 Enlarged prostate without lower urinary tract symptoms: Secondary | ICD-10-CM

## 2018-08-06 DIAGNOSIS — N138 Other obstructive and reflux uropathy: Secondary | ICD-10-CM

## 2018-08-06 LAB — URINALYSIS, COMPLETE (UACMP) WITH MICROSCOPIC
Bacteria, UA: NONE SEEN
Glucose, UA: NEGATIVE mg/dL
KETONES UR: NEGATIVE mg/dL
Leukocytes, UA: NEGATIVE
Nitrite: NEGATIVE
PROTEIN: 30 mg/dL — AB
Specific Gravity, Urine: >1.03 — ABNORMAL HIGH (ref 1.005–1.030)
WBC UA: NONE SEEN WBC/hpf (ref 0–5)
pH: 5 (ref 5.0–8.0)

## 2018-08-06 LAB — PSA: Prostatic Specific Antigen: 1.05 ng/mL (ref 0.00–4.00)

## 2018-08-06 MED ORDER — TAMSULOSIN HCL 0.4 MG PO CAPS: 0.4000 mg | ORAL_CAPSULE | Freq: Every day | ORAL | 3 refills | Status: DC

## 2018-08-06 NOTE — Progress Notes (Signed)
08/06/2018 9:25 AM   Nathaniel KluverFrederick Douglas Garcia 09/13/1946 [id number]  Referring provider: Duanne LimerickJones, Deanna C, MD [address] [place], [place] [address]  Chief Complaint  Patient presents with  . Benign Prostatic Hypertrophy    1year    HPI: 72 yo AAM with a history of hematuria, increase in PSA velocity and irregular prostate gland who presents for a 6 months follow up.  BPH WITH LUTS  (prostate and/or bladder) His IPSS score today is 4, which is mild lower urinary tract symptomatology. He is pleased with his quality life due to his urinary symptoms.  His previous I PSS score was 7/2.  His previous PVR was 0 mL.  He states the tamsulosin is helping.    His major complaints today are nocturia x 1 and post-void dribbling.  He has had these symptoms for several months.  He denies any dysuria, hematuria or suprapubic pain.   He also denies any recent fevers, chills, nausea or vomiting.  He does not have a family history of PCa.  IPSS    Row Name 08/06/18 0800         International Prostate Symptom Score   How often have you had the sensation of not emptying your bladder?  Not at All     How often have you had to urinate less than every two hours?  Less than 1 in 5 times     How often have you found you stopped and started again several times when you urinated?  Less than 1 in 5 times     How often have you found it difficult to postpone urination?  Not at All     How often have you had a weak urinary stream?  Less than 1 in 5 times     How often have you had to strain to start urination?  Not at All     How many times did you typically get up at night to urinate?  1 Time     Total IPSS Score  4       Quality of Life due to urinary symptoms   If you were to spend the rest of your life with your urinary condition just the way it is now how would you feel about that?  Pleased        Score:  1-7 Mild 8-19 Moderate 20-35 Severe    PMH: Past Medical History:    Diagnosis Date  . COPD (chronic obstructive pulmonary disease) (HCC)   . Hematuria   . Hyperlipidemia   . Stroke [facility](HCC)     Surgical History: Past Surgical History:  Procedure Laterality Date  . COLONOSCOPY  2011   normal- MD docs    Home Medications:  Allergies as of 08/06/2018   No Known Allergies     Medication List        Accurate as of 08/06/18  9:25 AM. Always use your most recent med list.          atorvastatin 80 MG tablet Commonly known as:  LIPITOR Take 1 tablet (80 mg total) by mouth daily.   clopidogrel 75 MG tablet Commonly known as:  PLAVIX Take 1 tablet (75 mg total) by mouth daily.   tamsulosin 0.4 MG Caps capsule Commonly known as:  FLOMAX Take 1 capsule (0.4 mg total) by mouth daily.       Allergies: No Known Allergies  Family History: Family History  Family history unknown: Yes    Social  History:  reports that he has been smoking cigarettes. He has a 15.00 pack-year smoking history. He has never used smokeless tobacco. He reports that he drinks about 1.0 standard drinks of alcohol per week. He reports that he has current or past drug history.  ROS: UROLOGY Frequent Urination?: No Hard to postpone urination?: No Burning/pain with urination?: No Get up at night to urinate?: Yes Leakage of urine?: Yes Urine stream starts and stops?: No Trouble starting stream?: No Do you have to strain to urinate?: No Blood in urine?: No Urinary tract infection?: No Sexually transmitted disease?: No Injury to kidneys or bladder?: No Painful intercourse?: No Weak stream?: No Erection problems?: No Penile pain?: No  Gastrointestinal Nausea?: No Vomiting?: No Indigestion/heartburn?: No Diarrhea?: No Constipation?: No  Constitutional Fever: No Night sweats?: No Weight loss?: No Fatigue?: No  Skin Skin rash/lesions?: No Itching?: No  Eyes Blurred vision?: No Double vision?: No  Ears/Nose/Throat Sore throat?: No Sinus problems?:  No  Hematologic/Lymphatic Swollen glands?: No Easy bruising?: No  Cardiovascular Leg swelling?: No Chest pain?: No  Respiratory Cough?: No Shortness of breath?: No  Endocrine Excessive thirst?: No  Musculoskeletal Back pain?: No Joint pain?: No  Neurological Headaches?: No Dizziness?: No  Psychologic Depression?: No Anxiety?: No  Physical Exam: BP (!) 145/66   Pulse 65   Ht 6\' 1"  (1.854 m)   Wt 117 lb (53.1 kg)   BMI 15.44 kg/m   Constitutional: Well nourished. Alert and oriented, No acute distress. HEENT: [place] AT, moist mucus membranes. Trachea midline, no masses. Cardiovascular: No clubbing, cyanosis, or edema. Respiratory: Normal respiratory effort, no increased work of breathing. GI: Abdomen is soft, non tender, non distended, no abdominal masses. Liver and spleen not palpable.  No hernias appreciated.  Stool sample for occult testing is not indicated.   GU: No CVA tenderness.  No bladder fullness or masses.  Patient with circumcised phallus.   Urethral meatus is patent.  No penile discharge. No penile lesions or rashes. Scrotum without lesions, cysts, rashes and/or edema.  Testicles are located scrotally bilaterally. No masses are appreciated in the testicles. Left and right epididymis are normal. Rectal: Patient with  normal sphincter tone. Anus and perineum without scarring or rashes. No rectal masses are appreciated. Prostate is approximately 60 grams, no nodules are appreciated. Could not palpate seminal vesicles.   Skin: No rashes, bruises or suspicious lesions. Lymph: No cervical or inguinal adenopathy. Neurologic: Grossly intact, no focal deficits, moving all 4 extremities. Psychiatric: Normal mood and affect.   Laboratory Data: PSA History  1.0 in 05/2014  0.9 in 02/2016  2.0 in 08/2016  0.82 in 05/2017  0.88 in 11/2017   1.05 in 07/2018 Lab Results  Component Value Date   CREATININE 1.06 06/29/2018       Component Value Date/Time   CHOL  177 06/29/2018 1043   HDL 54 06/29/2018 1043   CHOLHDL 3.3 06/29/2018 1043   LDLCALC 110 (H) 06/29/2018 1043    I have reviewed the labs   Assessment & Plan:    1. BPH with LUTS  - IPSS score is 4/1, it is improving   - Continue conservative management, avoiding bladder irritants and timed voiding's  - most bothersome symptoms is/are nocturia x 1 and post void dribbling   - continue tamsulosin 0.4 mg; refills given  - RTC in 12 month for I PSS, PSA and exam   2. Post-void dribbling Continue tamsulosin Instructed him to "shake" after voiding and press the perineum  after voiding    3. History of hematuria Hematuria work up completed in 05/2017 - findings positive for BPH No report of gross hematuria  Patient is complaining of weight loss UA today 6-10 RBC's RUS ordered              Return in about 6 months (around 02/04/2019) for IPSS, PSA and exam.  These notes generated with voice recognition software. I apologize for typographical errors.  Michiel Cowboy, PA-C  [facility] [address] [place], [place] [address] [phone]

## 2018-08-07 LAB — URINE CULTURE: CULTURE: NO GROWTH

## 2018-08-09 ENCOUNTER — Telehealth: Payer: Self-pay | Admitting: Urology

## 2018-08-09 ENCOUNTER — Other Ambulatory Visit: Payer: Self-pay | Admitting: Urology

## 2018-08-09 DIAGNOSIS — R3129 Other microscopic hematuria: Secondary | ICD-10-CM

## 2018-08-09 NOTE — Progress Notes (Signed)
RUS are in .   

## 2018-08-09 NOTE — Telephone Encounter (Signed)
-----   Message from [name], PA-C sent at 08/09/2018  8:50 AM EDT ----- I have spoken with Mr. Nathaniel ConstantMoore regarding the microscopic blood in his urine and the need to have a renal ultrasound at this time.  Please schedule a follow up appointment.

## 2018-08-09 NOTE — Telephone Encounter (Signed)
App made and scheduling will call patient today   Nathaniel DusterMichelle

## 2018-08-12 ENCOUNTER — Ambulatory Visit
Admission: RE | Admit: 2018-08-12 | Discharge: 2018-08-12 | Disposition: A | Discharge status: Home / Self Care | Payer: Medicare Other | Source: Ambulatory Visit | Attending: Urology | Admitting: Urology

## 2018-08-12 DIAGNOSIS — R3129 Other microscopic hematuria: Secondary | ICD-10-CM

## 2018-08-19 ENCOUNTER — Ambulatory Visit (INDEPENDENT_AMBULATORY_CARE_PROVIDER_SITE_OTHER): Payer: Medicare Other | Admitting: Urology

## 2018-08-19 ENCOUNTER — Encounter: Payer: Self-pay | Admitting: Urology

## 2018-08-19 VITALS — BP 131/72 | HR 63 | Ht 73.0 in | Wt 116.9 lb

## 2018-08-19 DIAGNOSIS — R3129 Other microscopic hematuria: Secondary | ICD-10-CM | POA: Diagnosis not present

## 2018-08-19 NOTE — Progress Notes (Signed)
08/19/2018 1:26 PM   [name] 02-10-1946 [id number]  Referring provider: Duanne Limerick, MD [address] [place], [place] [address]  No chief complaint on file.   HPI: 72 yo AAM with a history of hematuria, increase in PSA velocity and irregular prostate gland who presents for his renal ultrasound results.    Patient was found to have microscopic hematuria at his visit on August 06, 2017 with 6-10 RBCs.  His urine culture was negative.    He underwent cystoscopy in January 2018 and no worrisome findings were discovered.  RUS on 08/12/2018 revealed right kidney is rather small with slight increase in renal echogenicity. Question renal artery stenosis as a potential etiology for these findings. In this regard, question whether patient is hypertensive. Note that similar changes of this nature are not noted on the left.  No obstructing focus in either kidney.  No renal mass evident.  Mildly prominent prostate with mild inhomogeneous echotexture within the prostate.  Recent PSA was 1.05 on 08/06/2018.    His only complaint at this time is the post void dribbling and weight loss.  He states when he eats his stomach gets full very quickly and then he does not have any more appetite.  Patient denies any gross hematuria, dysuria or suprapubic/flank pain.  Patient denies any fevers, chills, nausea or vomiting.   PMH: Past Medical History:  Diagnosis Date  . COPD (chronic obstructive pulmonary disease) (HCC)   . Hematuria   . Hyperlipidemia   . Stroke [facility])     Surgical History: Past Surgical History:  Procedure Laterality Date  . COLONOSCOPY  2011   normal- [facility] docs    Home Medications:  Allergies as of 08/19/2018   No Known Allergies     Medication List        Accurate as of 08/19/18  1:26 PM. Always use your most recent med list.          atorvastatin 80 MG tablet Commonly known as:  LIPITOR Take 1 tablet (80 mg total) by mouth daily.     clopidogrel 75 MG tablet Commonly known as:  PLAVIX Take 1 tablet (75 mg total) by mouth daily.   tamsulosin 0.4 MG Caps capsule Commonly known as:  FLOMAX Take 1 capsule (0.4 mg total) by mouth daily.       Allergies: No Known Allergies  Family History: Family History  Family history unknown: Yes    Social History:  reports that he has been smoking cigarettes. He has a 15.00 pack-year smoking history. He has never used smokeless tobacco. He reports that he drinks about 1.0 standard drinks of alcohol per week. He reports that he has current or past drug history.  ROS: UROLOGY Frequent Urination?: No Hard to postpone urination?: No Burning/pain with urination?: No Get up at night to urinate?: No Leakage of urine?: Yes Urine stream starts and stops?: No Trouble starting stream?: No Do you have to strain to urinate?: No Blood in urine?: No Urinary tract infection?: No Sexually transmitted disease?: No Injury to kidneys or bladder?: No Painful intercourse?: No Weak stream?: No Erection problems?: No Penile pain?: No  Gastrointestinal Nausea?: No Vomiting?: No Indigestion/heartburn?: No Diarrhea?: No Constipation?: No  Constitutional Fever: No Night sweats?: No Weight loss?: No Fatigue?: No  Skin Skin rash/lesions?: No Itching?: No  Eyes Blurred vision?: No Double vision?: No  Ears/Nose/Throat Sore throat?: No Sinus problems?: No  Hematologic/Lymphatic Swollen glands?: No Easy bruising?: No  Cardiovascular  Leg swelling?: No Chest pain?: No  Respiratory Cough?: No Shortness of breath?: No  Endocrine Excessive thirst?: No  Musculoskeletal Back pain?: No Joint pain?: No  Neurological Headaches?: No Dizziness?: No  Psychologic Depression?: No Anxiety?: No  Physical Exam: BP 131/72 (BP Location: Left Arm, Patient Position: Sitting, Cuff Size: Normal)   Pulse 63   Ht 6\' 1"  (1.854 m)   Wt 116 lb 14.4 oz (53 kg)   BMI 15.42 kg/m    Constitutional: Well nourished. Alert and oriented, No acute distress. HEENT: [place] AT, moist mucus membranes. Trachea midline, no masses. Cardiovascular: No clubbing, cyanosis, or edema. Respiratory: Normal respiratory effort, no increased work of breathing. Skin: No rashes, bruises or suspicious lesions. Lymph: No cervical or inguinal adenopathy. Neurologic: Grossly intact, no focal deficits, moving all 4 extremities. Psychiatric: Normal mood and affect.   Laboratory Data: PSA History  1.0 in 05/2014  0.9 in 02/2016  2.0 in 08/2016  0.82 in 05/2017  0.88 in 11/2017   1.05 in 07/2018 Lab Results  Component Value Date   CREATININE 1.06 06/29/2018       Component Value Date/Time   CHOL 177 06/29/2018 1043   HDL 54 06/29/2018 1043   CHOLHDL 3.3 06/29/2018 1043   LDLCALC 110 (H) 06/29/2018 1043    I have reviewed the labs  Pertinent Imaging CLINICAL DATA:  Microscopic hematuria  EXAM: RENAL / URINARY TRACT ULTRASOUND COMPLETE  COMPARISON:  CT abdomen and pelvis July 28, 2018  FINDINGS: Right Kidney:  Length: 8.6 cm. Echogenicity is mildly increased. Renal cortical thickness is normal. No mass, perinephric fluid, or hydronephrosis visualized. No sonographically demonstrable calculus or ureterectasis.  Left Kidney:  Length: 9.6 cm. Echogenicity and renal cortical thickness are within normal limits. No mass, perinephric fluid, or hydronephrosis visualized. No sonographically demonstrable calculus or ureterectasis.  Bladder:  Appears normal for degree of bladder distention.  Prostate measures 3.9 x 3.6 x 4.8 cm with a measured prostatic volume of 35 cubic cm. Prostate echogenicity is slightly inhomogeneous.  IMPRESSION: 1. Right kidney is rather small with slight increase in renal echogenicity. Question renal artery stenosis as a potential etiology for these findings. In this regard, question whether patient is hypertensive. Note that  similar changes of this nature are not noted on the left.  2.  No obstructing focus in either kidney.  No renal mass evident.  3. Mildly prominent prostate with mild inhomogeneous echotexture within the prostate. This finding may warrant PSA and direct clinical examination.   Electronically Signed   By: Bretta Bang III M.D.   On: 08/13/2018 08:11   Assessment & Plan:   1. History of hematuria Hematuria work up completed in 05/2017 - findings positive for BPH No report of gross hematuria  Patient is complaining of weight loss RUS positive for ? Renal artery stenosis in the right, mildly prominent prostate with mild inhomogeneous echotexture within the prostate - asked the patient to follow up with Dr. Yetta Barre regarding the kidney findings Will continue 6 month follow ups for prostate              Return in about 6 months (around 02/18/2019) for IPSS, PSA and exam.  These notes generated with voice recognition software. I apologize for typographical errors.  Michiel Cowboy, PA-C  [facility] [address] [place], [place] [address] [phone]

## 2018-08-28 IMAGING — CT CT ABD-PEL WO/W CM
1 of 4 series · 8 of 32 positions shown, 13 images · IV contrast (iopamidol)
Comparison: 07/30/2015.

CLINICAL DATA: Microscopic hematuria.

EXAM:
CT ABDOMEN AND PELVIS WITHOUT AND WITH CONTRAST
TECHNIQUE: Multidetector CT imaging of the abdomen and pelvis was performed
following the standard protocol before and following the bolus
administration of intravenous contrast.
CONTRAST:  125mL DGKRHR-544 IOPAMIDOL (DGKRHR-544) INJECTION 61%

[Series 13: axial delay · axial · delayed · 0.62mm/px · z∈[-1101,-771]mm · 8 of 86 slices shown, 13 images]
[im 10/86  soft-tissue]
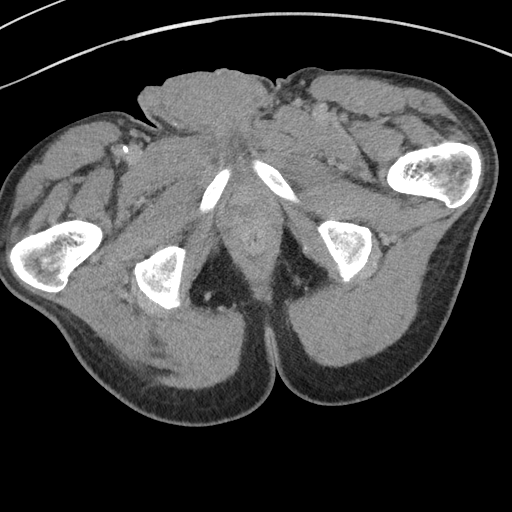
[im 10/86  bone]
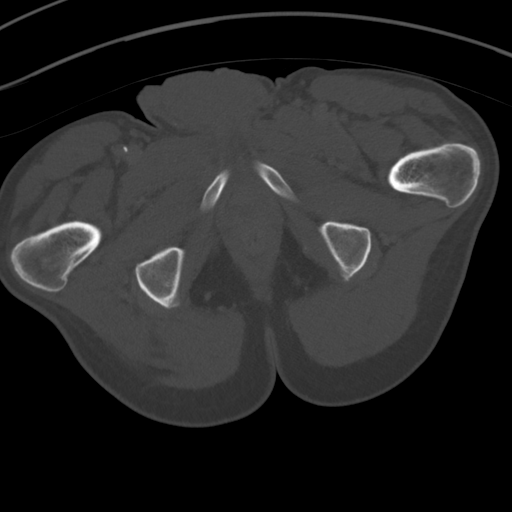
[im 19/86  soft-tissue]
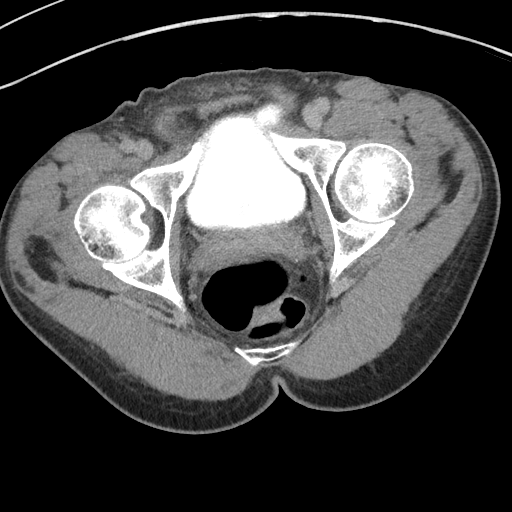
[im 29/86  soft-tissue]
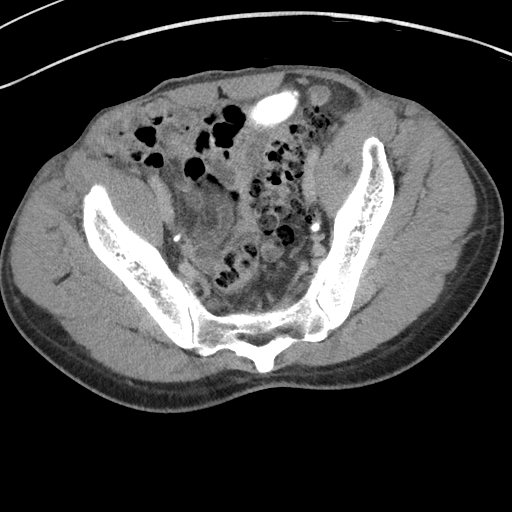
[im 38/86  soft-tissue]
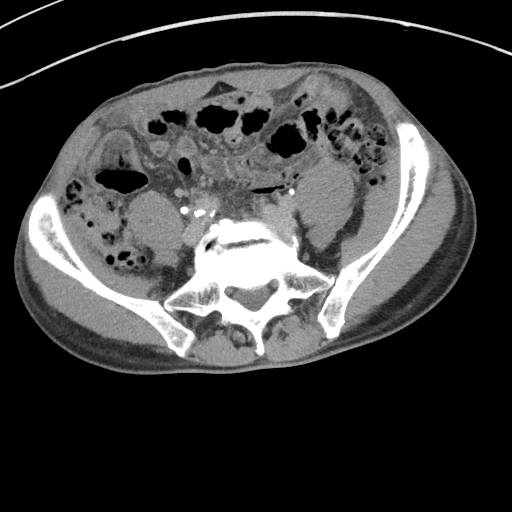
[im 48/86  soft-tissue]
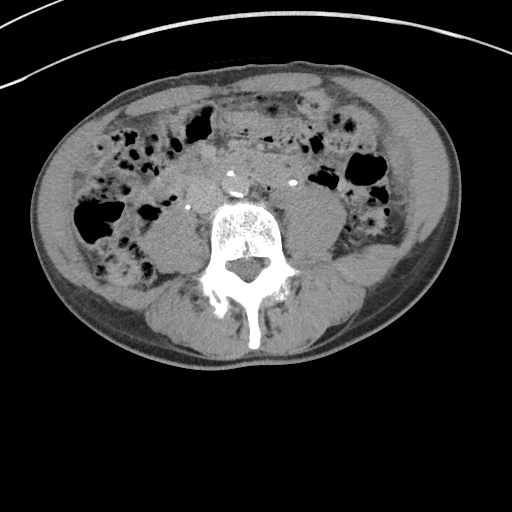
[im 48/86  lung]
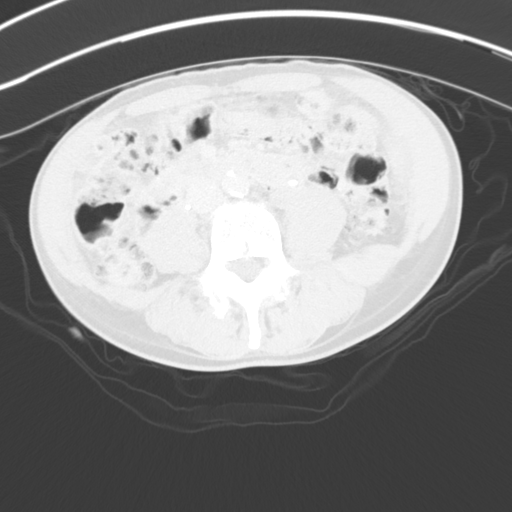
[im 57/86  soft-tissue]
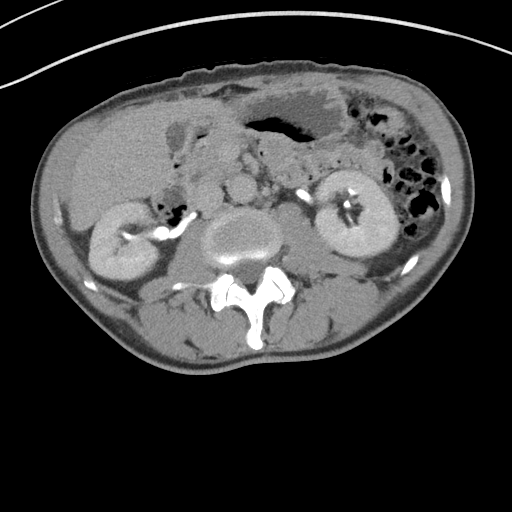
[im 57/86  lung]
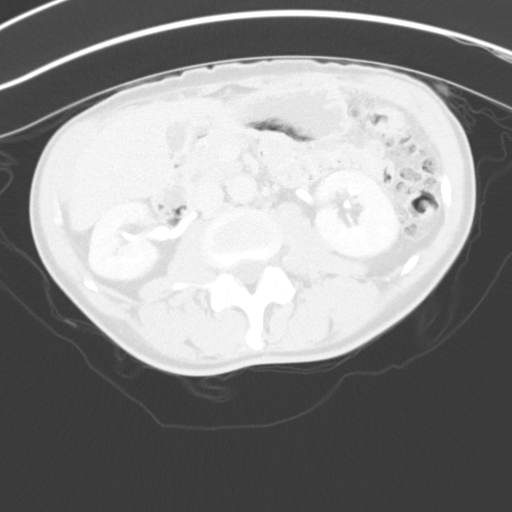
[im 67/86  soft-tissue]
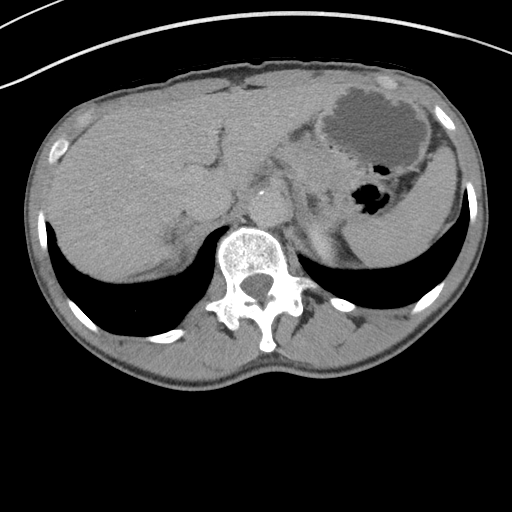
[im 67/86  lung]
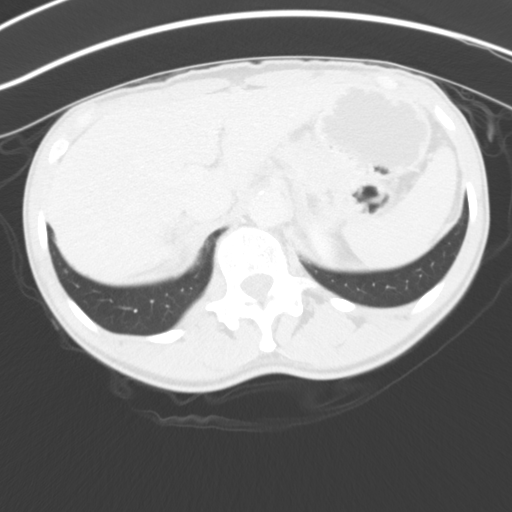
[im 76/86  soft-tissue]
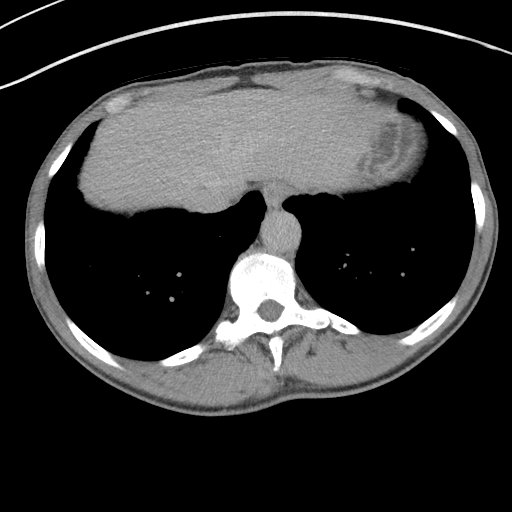
[im 76/86  lung]
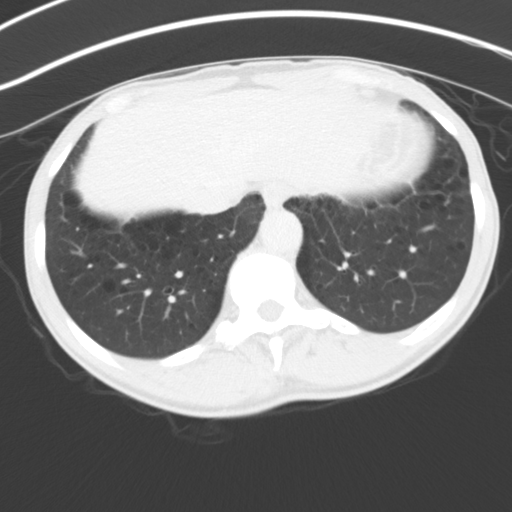

[8 of 32 positions shown; findings below may reference images not displayed]

FINDINGS: Lower chest: Subpleural 6 mm right lower lobe nodule is unchanged.
Centrilobular emphysema. Heart size normal. No pericardial or
pleural effusion.

Hepatobiliary: Liver and gallbladder are unremarkable. No biliary
ductal dilatation.

Pancreas: Negative.

Spleen: Negative.

Adrenals/Urinary Tract: Adrenal glands are unremarkable. No urinary
stones. Ureters are decompressed. No filling defects in the
intrarenal collecting systems, ureters or bladder.

Stomach/Bowel: Stomach, small bowel, appendix and colon are
unremarkable.

Vascular/Lymphatic: Atherosclerotic calcification of the arterial
vasculature without abdominal aortic aneurysm. No pathologically
enlarged lymph nodes.

Reproductive: Prostate is enlarged and slightly indents the bladder
base.

Other: No free fluid.  Mesenteries and peritoneum are unremarkable.

Musculoskeletal: No worrisome lytic or sclerotic lesions.
Degenerative changes are seen in the spine.
IMPRESSION: 1. Prostate is mildly enlarged and minimally indents the bladder
base. Otherwise, no findings to explain the patient's hematuria.
2.  Aortic atherosclerosis (650I9-170.0).

## 2018-10-25 ENCOUNTER — Encounter: Payer: Self-pay | Admitting: Family Medicine

## 2018-10-25 ENCOUNTER — Ambulatory Visit (INDEPENDENT_AMBULATORY_CARE_PROVIDER_SITE_OTHER): Payer: Medicare Other | Admitting: Family Medicine

## 2018-10-25 VITALS — BP 122/78 | HR 64 | Ht 73.0 in | Wt 118.0 lb

## 2018-10-25 DIAGNOSIS — R197 Diarrhea, unspecified: Secondary | ICD-10-CM

## 2018-10-25 DIAGNOSIS — F17200 Nicotine dependence, unspecified, uncomplicated: Secondary | ICD-10-CM

## 2018-10-25 NOTE — Progress Notes (Addendum)
Date:  10/25/2018   Name:  Nathaniel Garcia   DOB:  August 05, 1946   MRN:  [id number]   Chief Complaint: Diarrhea (started last Tuesday after making chilli at home- didn't let the meat brown all the way) Diarrhea   This is a new problem. The current episode started in the past 7 days. The problem occurs 2 to 4 times per day. The problem has been unchanged. The stool consistency is described as watery. The patient states that diarrhea does not awaken him from sleep. Associated symptoms include abdominal pain and a fever. Pertinent negatives include no bloating, chills, coughing, headaches, increased  flatus, myalgias, sweats, vomiting or weight loss. Nothing aggravates the symptoms. Risk factors include suspect food intake (did not fully cook hamburger). He has tried anti-motility drug for the symptoms. The treatment provided mild relief. There is no history of bowel resection, inflammatory bowel disease, irritable bowel syndrome, malabsorption, a recent abdominal surgery or short gut syndrome.     Review of Systems  Constitutional: Positive for fever. Negative for chills and weight loss.  HENT: Negative for drooling, ear discharge, ear pain and sore throat.   Respiratory: Negative for cough, shortness of breath and wheezing.   Cardiovascular: Negative for chest pain, palpitations and leg swelling.  Gastrointestinal: Positive for abdominal pain and diarrhea. Negative for bloating, blood in stool, constipation, flatus, nausea and vomiting.  Endocrine: Negative for polydipsia.  Genitourinary: Negative for dysuria, frequency, hematuria and urgency.  Musculoskeletal: Negative for back pain, myalgias and neck pain.  Skin: Negative for rash.  Allergic/Immunologic: Negative for environmental allergies.  Neurological: Negative for dizziness and headaches.  Hematological: Does not bruise/bleed easily.  Psychiatric/Behavioral: Negative for suicidal ideas. The patient is not nervous/anxious.      Patient Active Problem List   Diagnosis Date Noted  . Benign hematuria 09/18/2016  . Weakness of left hand 07/02/2016  . Lipid screening 01/03/2016  . Cerebral vascular disease 09/14/2015  . Carotid artery narrowing 08/15/2015  . Bilateral carotid artery stenosis 08/15/2015  . Bradycardia 08/01/2015  . H/O transient cerebral ischemia 08/01/2015  . Combined fat and carbohydrate induced hyperlipemia 08/01/2015  . Breathlessness on exertion 07/30/2015  . Alcoholic (HCC) 04/03/2015  . Absolute anemia 04/03/2015  . 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  . Cerebral vascular accident (HCC) 04/03/2015  . HLD (hyperlipidemia) 04/03/2015  . Blood glucose elevated 04/03/2015  . Lung nodule, solitary 04/03/2015  . Non compliance with medical treatment 04/03/2015  . Lung nodule, multiple 04/03/2015  . Cerebral infarction (HCC) 04/03/2015  . Abnormal lung field 04/03/2015  . Alcohol dependence (HCC) 04/03/2015    No Known Allergies  Past Surgical History:  Procedure Laterality Date  . COLONOSCOPY  2011   normal- [facility] docs    Social History   Tobacco Use  . Smoking status: Current Some Day Smoker    Packs/day: 0.25    Years: 60.00    Pack years: 15.00    Types: Cigarettes    Last attempt to quit: 07/19/2015    Years since quitting: 3.2  . Smokeless tobacco: Never Used  Substance Use Topics  . Alcohol use: Yes    Alcohol/week: 1.0 standard drinks    Types: 1 Cans of beer per week  . Drug use: Yes    Comment: marijuana     Medication list has been reviewed and updated.  Current Meds  Medication Sig  . atorvastatin (LIPITOR) 80 MG tablet Take 1 tablet (80 mg total) by mouth daily.  . clopidogrel (  PLAVIX) 75 MG tablet Take 1 tablet (75 mg total) by mouth daily.  . tamsulosin (FLOMAX) 0.4 MG CAPS capsule Take 1 capsule (0.4 mg total) by mouth daily.    PHQ 2/9 Scores 10/25/2018 06/16/2018 02/12/2016 01/03/2016  PHQ - 2 Score 0 0 0 0  PHQ- 9 Score 0 0 - -    Physical  Exam  Constitutional: He is oriented to person, place, and time. He appears well-developed and well-nourished.  HENT:  Head: Normocephalic.  Right Ear: External ear normal.  Left Ear: External ear normal.  Nose: Nose normal.  Mouth/Throat: Oropharynx is clear and moist.  Eyes: Pupils are equal, round, and reactive to light. Conjunctivae and EOM are normal. Right eye exhibits no discharge. Left eye exhibits no discharge. No scleral icterus.  Neck: Normal range of motion. Neck supple. No JVD present. No tracheal deviation present. No thyromegaly present.  Cardiovascular: Normal rate, regular rhythm, normal heart sounds and intact distal pulses. Exam reveals no gallop and no friction rub.  No murmur heard. Pulmonary/Chest: Breath sounds normal. No respiratory distress. He has no wheezes. He has no rales.  Abdominal: Soft. Bowel sounds are normal. He exhibits no mass. There is no hepatosplenomegaly. There is no tenderness. There is no rebound, no guarding and no CVA tenderness.  Genitourinary: Rectum normal. Rectal exam shows no mass, no tenderness and guaiac negative stool. Prostate is enlarged.  Musculoskeletal: Normal range of motion. He exhibits no edema or tenderness.  Lymphadenopathy:    He has no cervical adenopathy.  Neurological: He is alert and oriented to person, place, and time. He has normal strength and normal reflexes. No cranial nerve deficit.  Skin: Skin is warm. No rash noted.  multiple seborrheic keratitis noted generalized.  Nursing note and vitals reviewed.   BP 122/78   Pulse 64   Ht 6\' 1"  (1.854 m)   Wt 118 lb (53.5 kg)   BMI 15.57 kg/m   Assessment and Plan:  1. Tobacco dependence Smoking cessation discussed with patient however patient is not ready to pursue at this time.Patient has been advised of the health risks of smoking and counseled concerning cessation of tobacco products. I spent over 3 minutes for discussion and to answer questions.  2. Diarrhea of  presumed infectious origin Concern for intestinal infection due to undercooked hamburger.  Patient was given information on E. coli concern.  Will initiate a stool culture to evaluate for bacterial infection. - Stool Culture    Dr. Elizabeth Sauereanna Daeshon Grammatico [facility]  10/25/2018

## 2018-10-25 NOTE — Patient Instructions (Signed)
E. Coli Infection E. coli (Escherichia coli) are bacteria that can cause an infection in various parts of your body, including your intestines. E. coli bacteria normally live in the intestines of people and animals. Most types of E. coli do not cause infections, but some produce a poison (toxin) that can cause diarrhea. Depending on the toxin, this can cause mild or severe diarrhea. This condition can spread from person to person (is contagious). Toxin-producing E. coli can also spread from animals to humans. Most cases of E. coli infection come from cows (cattle). In some cases, this infection can cause a dangerous complication called hemolytic uremic syndrome (HUS). What are the causes? Causes of an E. coli intestinal infection include:  Eating raw or undercooked beef from infected cattle.  Touching an infected animal and then touching your mouth.  Eating fruits or vegetables that have come in contact with the feces of infected animals.  Drinking fluids that have been contaminated with E. coli from infected animals.  Coming into contact with a surface that has been contaminated by an infected person.  What increases the risk? This condition is more likely to develop in people who:  Eat raw or undercooked beef.  Drink raw (unpasteurized) milk, cider, or juice.  Are in close contact with cattle, goats, or sheep.  What are the signs or symptoms? Symptoms of this condition usually start about 3-4 days after the bacteria are swallowed (ingested). Symptoms include:  Severe cramps and tenderness in the abdomen.  Diarrhea. This may be watery or bloody.  Nausea and vomiting.  Dehydration. Dehydration can cause fatigue, thirst, a dry mouth, and less frequent urination.  Low fever. This is not common.  How is this diagnosed? This condition is diagnosed with a medical history and physical exam. A stool sample may be taken and tested for E. coli or toxins of E. coli. How is this  treated? Treatment for this condition includes rest and fluids (supportive care). If you have severe diarrhea, you may need to receive fluids through an IV tube. Symptoms of E. coli intestinal infection usually go away in 5-10 days. Some strains of E. coli may be treated with antibiotic or antidiarrheal medicines. However, this treatment is rarely used because these medicines may increase your risk for HUS. Follow these instructions at home: Eating and drinking  Drink enough fluids to keep your urine clear or pale yellow. You may need to drink small amounts of clear liquids frequently.  Ask your health care provider for specific rehydration instructions.  Do not drink milk, caffeine, or alcohol.  Eat small, frequent meals rather than large meals. General instructions  Take over-the-counter and prescription medicines only as told by your health care provider.  Wash your hands thoroughly before and after you prepare food and after you go to the bathroom (use the toilet). Make sure people who live with you also wash their hands often. If soap and water are not available, use hand sanitizer.  Clean surfaces that you touch with a product that contains chlorine bleach.  Keep all follow-up visits as told by your health care provider. This is important. How is this prevented?  Wash your hands often with soap and water. If soap and water are not available, use hand sanitizer. Always wash your hands: ? After you go to the bathroom. ? Before you touch food. ? After you prepare or cook beef. ? After you touch animals at farms, zoos, or fairs.  Do not eat raw or undercooked beef.    Do not drink unpasteurized milk or eat cheeses that were made with raw milk. Do not drink unpasteurized apple cider.  Wash cutting boards, counters, and utensils after you prepare raw meat.  Wash all fruits and vegetables before you eat or cook them. Contact a health care provider if:  Your symptoms do not get  better with treatment.  You have new symptoms.  Your vomiting or diarrhea gets worse. Get help right away if:  You have increasing pain or tenderness in your abdomen.  You have ongoing (persistent) vomiting or diarrhea.  You have abdominal pain that stays in one area (localizes).  Your diarrhea has more blood in it.  You have a fever.  You cannot eat or drink without vomiting.  You have signs of dehydration or HUS, such as: ? Pale skin. ? Dark urine, very little urine, or no urine. ? Cracked lips. ? Not making tears while crying. ? Dry mouth. ? Sunken eyes. ? Sleepiness. ? Weakness. ? Dizziness. This information is not intended to replace advice given to you by your health care provider. Make sure you discuss any questions you have with your health care provider. Document Released: 07/30/2005 Document Revised: 04/10/2016 Document Reviewed: 05/07/2015 Elsevier Interactive Patient Education  2018 [employer]  

## 2018-10-26 DIAGNOSIS — R197 Diarrhea, unspecified: Secondary | ICD-10-CM | POA: Diagnosis not present

## 2018-10-29 ENCOUNTER — Encounter (HOSPITAL_COMMUNITY): Payer: Self-pay

## 2018-10-29 ENCOUNTER — Ambulatory Visit (HOSPITAL_COMMUNITY)
Admission: EM | Admit: 2018-10-29 | Discharge: 2018-10-29 | Disposition: A | Discharge status: Home / Self Care | Payer: Medicare Other | Attending: Family Medicine | Admitting: Family Medicine

## 2018-10-29 ENCOUNTER — Other Ambulatory Visit: Payer: Self-pay

## 2018-10-29 DIAGNOSIS — J441 Chronic obstructive pulmonary disease with (acute) exacerbation: Secondary | ICD-10-CM | POA: Diagnosis not present

## 2018-10-29 MED ORDER — PREDNISONE 50 MG PO TABS: ORAL_TABLET | ORAL | 0 refills | Status: DC

## 2018-10-29 NOTE — ED Triage Notes (Signed)
Pt cc SOB and chest congestion this started yesterday.

## 2018-10-29 NOTE — ED Provider Notes (Signed)
[facility]    CSN: [id number] Arrival date & time: 10/29/18  1331     History   Chief Complaint Chief Complaint  Patient presents with  . Nasal Congestion    HPI Nathaniel Garcia is a 72 y.o. male.   This is a 72 year old gentleman who developed some shortness of breath and chest congestion yesterday and comes [facility] the [facility] for an initial [facility].  His past medical history contains a history of emphysema, CVA, hyperlipidemia, lung nodule, CVA, alcoholism.  Problem list has many redundancies.  Patient is still smoking.  He said he smoked a cigarette yesterday and when he finished, he felt like all the breath had been taken out of his lungs.  Patient's had no fever, nausea, vomiting, chest pain.     Past Medical History:  Diagnosis Date  . COPD (chronic obstructive pulmonary disease) (HCC)   . Hematuria   . Hyperlipidemia   . Stroke [facility](HCC)     Patient Active Problem List   Diagnosis Date Noted  . Benign hematuria 09/18/2016  . Weakness of left hand 07/02/2016  . Lipid screening 01/03/2016  . Cerebral vascular disease 09/14/2015  . Carotid artery narrowing 08/15/2015  . Bilateral carotid artery stenosis 08/15/2015  . Bradycardia 08/01/2015  . H/O transient cerebral ischemia 08/01/2015  . Combined fat and carbohydrate induced hyperlipemia 08/01/2015  . Breathlessness on exertion 07/30/2015  . Alcoholic (HCC) 04/03/2015  . Absolute anemia 04/03/2015  . 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  . Cerebral vascular accident (HCC) 04/03/2015  . HLD (hyperlipidemia) 04/03/2015  . Blood glucose elevated 04/03/2015  . Lung nodule, solitary 04/03/2015  . Non compliance with medical treatment 04/03/2015  . Lung nodule, multiple 04/03/2015  . Cerebral infarction (HCC) 04/03/2015  . Abnormal lung field 04/03/2015  . Alcohol dependence (HCC) 04/03/2015    Past Surgical History:  Procedure Laterality Date  . COLONOSCOPY  2011   normal- [facility] docs       Home Medications    Prior to Admission medications   Medication Sig Start Date End Date Taking? Authorizing Provider  atorvastatin (LIPITOR) 80 MG tablet Take 1 tablet (80 mg total) by mouth daily. 06/29/18 06/29/19  Duanne LimerickJones, Deanna C, MD  clopidogrel (PLAVIX) 75 MG tablet Take 1 tablet (75 mg total) by mouth daily. 06/29/18   Duanne LimerickJones, Deanna C, MD  predniSONE (DELTASONE) 50 MG tablet 1 daily with food 10/29/18   Elvina SidleLauenstein, Cathie Bonnell, MD  tamsulosin (FLOMAX) 0.4 MG CAPS capsule Take 1 capsule (0.4 mg total) by mouth daily. 08/06/18   Harle BattiestMcGowan, Shannon A, PA-C    Family History Family History  Family history unknown: Yes    Social History Social History   Tobacco Use  . Smoking status: Current Some Day Smoker    Packs/day: 0.25    Years: 60.00    Pack years: 15.00    Types: Cigarettes    Last attempt to quit: 07/19/2015    Years since quitting: 3.2  . Smokeless tobacco: Never Used  Substance Use Topics  . Alcohol use: Yes    Alcohol/week: 1.0 standard drinks    Types: 1 Cans of beer per week  . Drug use: Yes    Comment: marijuana     Allergies   Patient has no known allergies.   Review of Systems Review of Systems   Physical Exam Triage Vital Signs ED Triage Vitals  Enc Vitals Group     BP --      Pulse Rate  10/29/18 1351 64     Resp --      Temp 10/29/18 1351 98.8 F (37.1 C)     Temp Source 10/29/18 1351 Oral     SpO2 10/29/18 1351 100 %     Weight 10/29/18 1349 118 lb (53.5 kg)     Height --      Head Circumference --      Peak Flow --      Pain Score 10/29/18 1349 6     Pain Loc --      Pain Edu? --      Excl. in GC? --    No data found.  Updated Vital Signs Pulse 64   Temp 98.8 F (37.1 C) (Oral)   Wt 53.5 kg   SpO2 100%   BMI 15.57 kg/m    Physical Exam Vitals signs and nursing note reviewed.  Constitutional:      Appearance: Normal appearance.     Comments: Patient is very cachectic with a barrel chest  HENT:      Head: Normocephalic.     Right Ear: External ear normal.     Left Ear: External ear normal.     Nose: Nose normal.     Mouth/Throat:     Mouth: Mucous membranes are moist.     Pharynx: Oropharynx is clear.     Comments: Patient has an upper plate Eyes:     Conjunctiva/sclera: Conjunctivae normal.  Neck:     Musculoskeletal: Normal range of motion and neck supple.  Cardiovascular:     Rate and Rhythm: Normal rate.     Heart sounds: Normal heart sounds.  Pulmonary:     Effort: Pulmonary effort is normal.     Breath sounds: Wheezing present.  Musculoskeletal: Normal range of motion.  Skin:    General: Skin is warm and dry.  Neurological:     Mental Status: He is alert and oriented to person, place, and time.  Psychiatric:        Mood and Affect: Mood normal.      UC Treatments / Results  Labs (all labs ordered are listed, but only abnormal results are displayed) Labs Reviewed - No data to display  EKG None  Radiology No results found.  Procedures Procedures (including critical care time)  Medications Ordered in UC Medications - No data to display  Initial Impression / Assessment and Plan / UC Course  I have reviewed the triage vital signs and the nursing notes.  Pertinent labs & imaging results that were available during my care of the patient were reviewed by me and considered in my medical decision making (see chart for details).    Final Clinical Impressions(s) / UC Diagnoses   Final diagnoses:  COPD exacerbation (HCC)     Discharge Instructions     You need to quit smoking.    ED Prescriptions    Medication Sig Dispense Auth. Provider   predniSONE (DELTASONE) 50 MG tablet 1 daily with food 5 tablet Elvina Sidle, MD     Controlled Substance Prescriptions [place] Controlled Substance Registry consulted? Not Applicable   Elvina Sidle, MD 10/29/18 [phone]

## 2018-10-29 NOTE — Discharge Instructions (Addendum)
You need to quit smoking.  

## 2018-10-30 LAB — STOOL CULTURE: E coli, Shiga toxin Assay: NEGATIVE

## 2018-11-02 ENCOUNTER — Encounter: Payer: Self-pay | Admitting: Family Medicine

## 2018-11-02 ENCOUNTER — Ambulatory Visit (INDEPENDENT_AMBULATORY_CARE_PROVIDER_SITE_OTHER): Payer: Medicare Other | Admitting: Family Medicine

## 2018-11-02 VITALS — BP 112/60 | HR 78 | Temp 98.0°F | Ht 73.0 in | Wt 119.0 lb

## 2018-11-02 DIAGNOSIS — J440 Chronic obstructive pulmonary disease with acute lower respiratory infection: Secondary | ICD-10-CM | POA: Diagnosis not present

## 2018-11-02 DIAGNOSIS — J209 Acute bronchitis, unspecified: Secondary | ICD-10-CM

## 2018-11-02 MED ORDER — AZITHROMYCIN 250 MG PO TABS: ORAL_TABLET | ORAL | 0 refills | Status: DC

## 2018-11-02 MED ORDER — PREDNISONE 10 MG PO TABS: ORAL_TABLET | ORAL | 1 refills | Status: DC

## 2018-11-02 MED ORDER — BENZONATATE 100 MG PO CAPS: 100.0000 mg | ORAL_CAPSULE | Freq: Two times a day (BID) | ORAL | 0 refills | Status: DC | PRN

## 2018-11-02 NOTE — Progress Notes (Signed)
Date:  11/02/2018   Name:  Nathaniel Garcia   DOB:  July 28, 1946   MRN:  [id number]   Chief Complaint: Follow-up (seen in UC for copd exacerbation- was given prednisone/ last day was supposed to be tomorrow, but he "ran out yesterday" Still having cough and chest congestion)  Cough  This is a new problem. The current episode started in the past 7 days. The problem has been gradually improving. The problem occurs constantly. The cough is non-productive. Associated symptoms include rhinorrhea and shortness of breath. Pertinent negatives include no chest pain, chills, ear congestion, ear pain, fever, headaches, heartburn, hemoptysis, myalgias, nasal congestion, postnasal drip, rash, sore throat, sweats, weight loss or wheezing. Exacerbated by: smoking. Risk factors for lung disease include smoking/tobacco exposure. He has tried prescription cough suppressant (prednisone) for the symptoms. The treatment provided mild relief. There is no history of asthma, bronchiectasis, bronchitis, COPD, emphysema, environmental allergies or pneumonia.    Review of Systems  Constitutional: Negative for chills, fever and weight loss.  HENT: Positive for rhinorrhea. Negative for drooling, ear discharge, ear pain, postnasal drip and sore throat.   Respiratory: Positive for cough and shortness of breath. Negative for hemoptysis and wheezing.   Cardiovascular: Negative for chest pain, palpitations and leg swelling.  Gastrointestinal: Negative for abdominal pain, blood in stool, constipation, diarrhea, heartburn and nausea.  Endocrine: Negative for polydipsia.  Genitourinary: Negative for dysuria, frequency, hematuria and urgency.  Musculoskeletal: Negative for back pain, myalgias and neck pain.  Skin: Negative for rash.  Allergic/Immunologic: Negative for environmental allergies.  Neurological: Negative for dizziness and headaches.  Hematological: Does not bruise/bleed easily.  Psychiatric/Behavioral:  Negative for suicidal ideas. The patient is not nervous/anxious.     Patient Active Problem List   Diagnosis Date Noted  . Benign hematuria 09/18/2016  . Weakness of left hand 07/02/2016  . Lipid screening 01/03/2016  . Cerebral vascular disease 09/14/2015  . Carotid artery narrowing 08/15/2015  . Bilateral carotid artery stenosis 08/15/2015  . Bradycardia 08/01/2015  . H/O transient cerebral ischemia 08/01/2015  . Combined fat and carbohydrate induced hyperlipemia 08/01/2015  . Breathlessness on exertion 07/30/2015  . Alcoholic (HCC) 04/03/2015  . Absolute anemia 04/03/2015  . 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  . Cerebral vascular accident (HCC) 04/03/2015  . HLD (hyperlipidemia) 04/03/2015  . Blood glucose elevated 04/03/2015  . Lung nodule, solitary 04/03/2015  . Non compliance with medical treatment 04/03/2015  . Lung nodule, multiple 04/03/2015  . Cerebral infarction (HCC) 04/03/2015  . Abnormal lung field 04/03/2015  . Alcohol dependence (HCC) 04/03/2015    No Known Allergies  Past Surgical History:  Procedure Laterality Date  . COLONOSCOPY  2011   normal- MD docs    Social History   Tobacco Use  . Smoking status: Former Smoker    Packs/day: 0.25    Years: 60.00    Pack years: 15.00    Types: Cigarettes    Last attempt to quit: 10/29/2018    Years since quitting: 0.0  . Smokeless tobacco: Never Used  Substance Use Topics  . Alcohol use: Yes    Alcohol/week: 1.0 standard drinks    Types: 1 Cans of beer per week  . Drug use: Yes    Comment: marijuana     Medication list has been reviewed and updated.  Current Meds  Medication Sig  . atorvastatin (LIPITOR) 80 MG tablet Take 1 tablet (80 mg total) by mouth daily.  . clopidogrel (PLAVIX) 75 MG tablet Take 1 tablet (  75 mg total) by mouth daily.  . tamsulosin (FLOMAX) 0.4 MG CAPS capsule Take 1 capsule (0.4 mg total) by mouth daily.    PHQ 2/9 Scores 10/25/2018 06/16/2018 02/12/2016 01/03/2016  PHQ  - 2 Score 0 0 0 0  PHQ- 9 Score 0 0 - -    Physical Exam Vitals signs and nursing note reviewed.  HENT:     Head: Normocephalic.     Right Ear: External ear normal.     Left Ear: External ear normal.     Nose: Nose normal.  Eyes:     General: No scleral icterus.       Right eye: No discharge.        Left eye: No discharge.     Conjunctiva/sclera: Conjunctivae normal.     Pupils: Pupils are equal, round, and reactive to light.  Neck:     Musculoskeletal: Normal range of motion and neck supple.     Thyroid: No thyromegaly.     Vascular: No JVD.     Trachea: No tracheal deviation.  Cardiovascular:     Rate and Rhythm: Normal rate and regular rhythm.     Heart sounds: Normal heart sounds. No murmur. No friction rub. No gallop.   Pulmonary:     Effort: No respiratory distress.     Breath sounds: Normal breath sounds. Decreased air movement present. No decreased breath sounds, wheezing, rhonchi or rales.  Abdominal:     General: Bowel sounds are normal.     Palpations: Abdomen is soft. There is no mass.     Tenderness: There is no abdominal tenderness. There is no guarding or rebound.  Musculoskeletal: Normal range of motion.        General: No tenderness.  Lymphadenopathy:     Cervical: No cervical adenopathy.  Skin:    General: Skin is warm.     Findings: No rash.  Neurological:     Mental Status: He is alert and oriented to person, place, and time.     Cranial Nerves: No cranial nerve deficit.     Deep Tendon Reflexes: Reflexes are normal and symmetric.     BP 112/60   Pulse 78   Temp 98 F (36.7 C) (Oral)   Ht 6\' 1"  (1.854 m)   Wt 119 lb (54 kg)   SpO2 98%   BMI 15.70 kg/m   Assessment and Plan:  1. COPD (chronic obstructive pulmonary disease) with acute bronchitis (HCC) Acute bronchitis on chronic obstructive pulmonary disease.  Patient is gradually improving on just prednisone 50 mg but continues to have a nonproductive cough.  Start azithromycin 2 today  then 1 a day for 4 days and continue his prednisone as a taper starting at 60 mg and tapering to 10/2 weeks.  Also will be given Tessalon Perles 100 mg 1 twice a day as needed cough patient was also given a halo which is a nor O to take 1 puff a day in the event that Dr. Meredeth IdeFleming had previously put him on Spiriva and albuterol inhaler. - azithromycin (ZITHROMAX) 250 MG tablet; 2 today then 1 a day for 4 day  Dispense: 6 tablet; Refill: 0 - predniSONE (DELTASONE) 10 MG tablet; Taper 6,6,6,5,5,5,4,4,3,3,2,2,1,1  Dispense: 53 tablet; Refill: 1 - benzonatate (TESSALON) 100 MG capsule; Take 1 capsule (100 mg total) by mouth 2 (two) times daily as needed for cough.  Dispense: 20 capsule; Refill: 0

## 2018-11-04 ENCOUNTER — Other Ambulatory Visit: Payer: Self-pay | Admitting: Family Medicine

## 2018-11-04 DIAGNOSIS — I679 Cerebrovascular disease, unspecified: Secondary | ICD-10-CM

## 2018-11-04 DIAGNOSIS — E785 Hyperlipidemia, unspecified: Secondary | ICD-10-CM

## 2018-12-30 ENCOUNTER — Ambulatory Visit (INDEPENDENT_AMBULATORY_CARE_PROVIDER_SITE_OTHER): Payer: Medicare Other | Admitting: Family Medicine

## 2018-12-30 ENCOUNTER — Encounter: Payer: Self-pay | Admitting: Family Medicine

## 2018-12-30 VITALS — BP 116/68 | HR 73 | Ht 73.0 in | Wt 119.0 lb

## 2018-12-30 DIAGNOSIS — J440 Chronic obstructive pulmonary disease with acute lower respiratory infection: Secondary | ICD-10-CM

## 2018-12-30 DIAGNOSIS — J209 Acute bronchitis, unspecified: Secondary | ICD-10-CM

## 2018-12-30 DIAGNOSIS — F17210 Nicotine dependence, cigarettes, uncomplicated: Secondary | ICD-10-CM

## 2018-12-30 DIAGNOSIS — N400 Enlarged prostate without lower urinary tract symptoms: Secondary | ICD-10-CM

## 2018-12-30 DIAGNOSIS — I679 Cerebrovascular disease, unspecified: Secondary | ICD-10-CM | POA: Diagnosis not present

## 2018-12-30 DIAGNOSIS — E785 Hyperlipidemia, unspecified: Secondary | ICD-10-CM

## 2018-12-30 DIAGNOSIS — I700 Atherosclerosis of aorta: Secondary | ICD-10-CM

## 2018-12-30 MED ORDER — TAMSULOSIN HCL 0.4 MG PO CAPS: 0.4000 mg | ORAL_CAPSULE | Freq: Every day | ORAL | 1 refills | Status: DC

## 2018-12-30 MED ORDER — CLOPIDOGREL BISULFATE 75 MG PO TABS: 75.0000 mg | ORAL_TABLET | Freq: Every day | ORAL | 1 refills | Status: DC

## 2018-12-30 MED ORDER — ATORVASTATIN CALCIUM 80 MG PO TABS: 80.0000 mg | ORAL_TABLET | Freq: Every day | ORAL | 1 refills | Status: DC

## 2018-12-30 NOTE — Progress Notes (Signed)
Date:  12/30/2018   Name:  Nathaniel KluverFrederick Douglas Garcia   DOB:  06/25/1946   MRN:  [id number]   Chief Complaint: Follow-up (weightloss- has not loss anymore weight)  Benign Prostatic Hypertrophy  This is a chronic problem. The current episode started more than 1 month ago. The problem has been gradually improving since onset. Irritative symptoms do not include frequency, nocturia or urgency. Obstructive symptoms do not include dribbling, incomplete emptying, an intermittent stream, a slower stream, straining or a weak stream. Pertinent negatives include no chills, dysuria, genital pain, hematuria, hesitancy, nausea or vomiting. Nothing aggravates the symptoms. Past treatments include tamsulosin. The treatment provided moderate relief.  Hyperlipidemia  This is a chronic problem. The current episode started more than 1 year ago. The problem is controlled. Recent lipid tests were reviewed and are normal. He has no history of chronic renal disease, diabetes, hypothyroidism, obesity or nephrotic syndrome. There are no known factors aggravating his hyperlipidemia. Pertinent negatives include no chest pain, focal sensory loss, focal weakness, leg pain, myalgias or shortness of breath. He is currently on no antihyperlipidemic treatment. The current treatment provides no improvement of lipids. There are no compliance problems.  There are no known risk factors for coronary artery disease.  Neurologic Problem  The patient's pertinent negatives include no altered mental status, clumsiness, focal sensory loss, focal weakness, loss of balance, memory loss, near-syncope, slurred speech, syncope, visual change or weakness. Primary symptoms comment: for cerebrovascular disease. This is a chronic problem. The current episode started more than 1 year ago. The problem has been gradually improving since onset. There was no focality noted. Pertinent negatives include no abdominal pain, back pain, chest pain, dizziness, fever,  headaches, nausea, neck pain, palpitations, shortness of breath or vomiting. Treatments tried: plavix. The treatment provided moderate relief.    Review of Systems  Constitutional: Negative for chills and fever.  HENT: Negative for drooling, ear discharge, ear pain and sore throat.   Respiratory: Negative for cough, shortness of breath and wheezing.   Cardiovascular: Negative for chest pain, palpitations, leg swelling and near-syncope.  Gastrointestinal: Negative for abdominal pain, blood in stool, constipation, diarrhea, nausea and vomiting.  Endocrine: Negative for polydipsia.  Genitourinary: Negative for dysuria, frequency, hematuria, hesitancy, incomplete emptying, nocturia and urgency.  Musculoskeletal: Negative for back pain, myalgias and neck pain.  Skin: Negative for rash.  Allergic/Immunologic: Negative for environmental allergies.  Neurological: Negative for dizziness, focal weakness, syncope, weakness, headaches and loss of balance.  Hematological: Does not bruise/bleed easily.  Psychiatric/Behavioral: Negative for memory loss and suicidal ideas. The patient is not nervous/anxious.     Patient Active Problem List   Diagnosis Date Noted  . Benign hematuria 09/18/2016  . Weakness of left hand 07/02/2016  . Lipid screening 01/03/2016  . Cerebral vascular disease 09/14/2015  . Carotid artery narrowing 08/15/2015  . Bilateral carotid artery stenosis 08/15/2015  . Bradycardia 08/01/2015  . H/O transient cerebral ischemia 08/01/2015  . Combined fat and carbohydrate induced hyperlipemia 08/01/2015  . Breathlessness on exertion 07/30/2015  . Alcoholic (HCC) 04/03/2015  . Absolute anemia 04/03/2015  . 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  . Cerebral vascular accident (HCC) 04/03/2015  . HLD (hyperlipidemia) 04/03/2015  . Blood glucose elevated 04/03/2015  . Lung nodule, solitary 04/03/2015  . Non compliance with medical treatment 04/03/2015  . Lung nodule, multiple  04/03/2015  . Cerebral infarction (HCC) 04/03/2015  . Abnormal lung field 04/03/2015  . Alcohol dependence (HCC) 04/03/2015    No Known Allergies  Past Surgical History:  Procedure Laterality Date  . COLONOSCOPY  2011   normal- [facility] docs    Social History   Tobacco Use  . Smoking status: Current Every Day Smoker    Packs/day: 0.25    Years: 60.00    Pack years: 15.00    Types: Cigarettes    Last attempt to quit: 10/29/2018    Years since quitting: 0.1  . Smokeless tobacco: Never Used  Substance Use Topics  . Alcohol use: Yes    Alcohol/week: 1.0 standard drinks    Types: 1 Cans of beer per week  . Drug use: Yes    Comment: marijuana     Medication list has been reviewed and updated.  Current Meds  Medication Sig  . atorvastatin (LIPITOR) 80 MG tablet TAKE 1 TABLET BY MOUTH  EVERY DAY  . clopidogrel (PLAVIX) 75 MG tablet TAKE 1 TABLET BY MOUTH  EVERY DAY  . tamsulosin (FLOMAX) 0.4 MG CAPS capsule Take 1 capsule (0.4 mg total) by mouth daily.    PHQ 2/9 Scores 10/25/2018 06/16/2018 02/12/2016 01/03/2016  PHQ - 2 Score 0 0 0 0  PHQ- 9 Score 0 0 - -    Physical Exam Vitals signs and nursing note reviewed.  HENT:     Head: Normocephalic.     Right Ear: Tympanic membrane, ear canal and external ear normal.     Left Ear: Tympanic membrane, ear canal and external ear normal.     Nose: Nose normal.  Eyes:     General: No scleral icterus.       Right eye: No discharge.        Left eye: No discharge.     Conjunctiva/sclera: Conjunctivae normal.     Pupils: Pupils are equal, round, and reactive to light.  Neck:     Musculoskeletal: Normal range of motion and neck supple.     Thyroid: No thyromegaly.     Vascular: No JVD.     Trachea: No tracheal deviation.  Cardiovascular:     Rate and Rhythm: Normal rate and regular rhythm.     Pulses: Normal pulses.     Heart sounds: Normal heart sounds. No murmur. No friction rub. No gallop.   Pulmonary:     Effort: Pulmonary  effort is normal. No respiratory distress.     Breath sounds: Normal breath sounds. No wheezing or rales.  Abdominal:     General: Bowel sounds are normal.     Palpations: Abdomen is soft. There is no mass.     Tenderness: There is no abdominal tenderness. There is no guarding or rebound.  Musculoskeletal: Normal range of motion.        General: No tenderness.  Lymphadenopathy:     Cervical: No cervical adenopathy.  Skin:    General: Skin is warm.     Findings: No rash.  Neurological:     Mental Status: He is alert and oriented to person, place, and time.     Cranial Nerves: No cranial nerve deficit.     Deep Tendon Reflexes: Reflexes are normal and symmetric.  Psychiatric:        Mood and Affect: Mood normal.     BP 116/68   Pulse 73   Ht 6\' 1"  (1.854 m)   Wt 119 lb (54 kg)   SpO2 97%   BMI 15.70 kg/m    Assessment and Plan: 1. Cerebral vascular disease Patient has a history of a cerebral  Vascular event.  She  has had no recurrence or any extension of focality.  Patient will continue Plavix 75 mg daily. - clopidogrel (PLAVIX) 75 MG tablet; Take 1 tablet (75 mg total) by mouth daily.  Dispense: 90 tablet; Refill: 1 - atorvastatin (LIPITOR) 80 MG tablet; Take 1 tablet (80 mg total) by mouth daily.  Dispense: 90 tablet; Refill: 1  2. Hyperlipidemia, unspecified hyperlipidemia type Chronic.  Controlled.  Patient will continue atorvastatin 80 mg once a day. - atorvastatin (LIPITOR) 80 MG tablet; Take 1 tablet (80 mg total) by mouth daily.  Dispense: 90 tablet; Refill: 1  3. Prostate hypertrophy Patient has not been taking his tamsulosin but we will can continue this and I will refill his medication at 0.4 daily.  Review of PSA was made. - tamsulosin (FLOMAX) 0.4 MG CAPS capsule; Take 1 capsule (0.4 mg total) by mouth daily.  Dispense: 90 capsule; Refill: 1  4. COPD (chronic obstructive pulmonary disease) with acute bronchitis (HCC) Patient has history of COPD with chest CT  noting moderate emphysema.  Is followed by Dr. Meredeth IdeFleming with Spiriva and albuterol inhaler.  Patient has not been taking Spiriva the last couple days because he ran out of medication and this will be refilled  5. Aortic atherosclerosis (HCC) Patient has a history of aortic atherosclerosis will continue Plavix. - clopidogrel (PLAVIX) 75 MG tablet; Take 1 tablet (75 mg total) by mouth daily.  Dispense: 90 tablet; Refill: 1  6. Cigarette nicotine dependence without complication Patient has been advised of the health risks of smoking and counseled concerning cessation of tobacco products. I spent over 3 minutes for discussion and to answer questions.

## 2019-01-20 DIAGNOSIS — R0602 Shortness of breath: Secondary | ICD-10-CM | POA: Diagnosis not present

## 2019-01-20 DIAGNOSIS — J432 Centrilobular emphysema: Secondary | ICD-10-CM | POA: Diagnosis not present

## 2019-01-31 ENCOUNTER — Ambulatory Visit (INDEPENDENT_AMBULATORY_CARE_PROVIDER_SITE_OTHER): Payer: Medicare Other | Admitting: Family Medicine

## 2019-01-31 ENCOUNTER — Encounter: Payer: Self-pay | Admitting: Family Medicine

## 2019-01-31 ENCOUNTER — Other Ambulatory Visit: Payer: Self-pay

## 2019-01-31 VITALS — BP 130/80 | HR 54 | Temp 97.9°F | Ht 73.0 in | Wt 121.0 lb

## 2019-01-31 DIAGNOSIS — J209 Acute bronchitis, unspecified: Secondary | ICD-10-CM

## 2019-01-31 DIAGNOSIS — J301 Allergic rhinitis due to pollen: Secondary | ICD-10-CM

## 2019-01-31 DIAGNOSIS — J440 Chronic obstructive pulmonary disease with acute lower respiratory infection: Secondary | ICD-10-CM | POA: Diagnosis not present

## 2019-01-31 DIAGNOSIS — J432 Centrilobular emphysema: Secondary | ICD-10-CM | POA: Diagnosis not present

## 2019-01-31 MED ORDER — ALBUTEROL SULFATE HFA 108 (90 BASE) MCG/ACT IN AERS: 2.0000 | INHALATION_SPRAY | Freq: Four times a day (QID) | RESPIRATORY_TRACT | 2 refills | Status: DC | PRN

## 2019-01-31 MED ORDER — AMOXICILLIN 500 MG PO CAPS: 500.0000 mg | ORAL_CAPSULE | Freq: Three times a day (TID) | ORAL | 0 refills | Status: DC

## 2019-01-31 MED ORDER — MONTELUKAST SODIUM 10 MG PO TABS: 10.0000 mg | ORAL_TABLET | Freq: Every day | ORAL | 3 refills | Status: DC

## 2019-01-31 NOTE — Progress Notes (Signed)
Date:  01/31/2019   Name:  Nathaniel Garcia   DOB:  05-Jun-1946   MRN:  [id number]   Chief Complaint: Sinusitis (chest cong, nose is sore, wheezing when breathing)  Sinusitis  This is a new problem. The current episode started 1 to 4 weeks ago (1-2 days). The problem has been waxing and waning since onset. There has been no fever. The fever has been present for 1 to 2 days. The pain is mild. Associated symptoms include congestion, coughing, shortness of breath and sinus pressure. Pertinent negatives include no chills, diaphoresis, ear pain, headaches, hoarse voice, neck pain, sneezing, sore throat or swollen glands. Past treatments include nothing.    Review of Systems  Constitutional: Negative for chills, diaphoresis and fever.  HENT: Positive for congestion, rhinorrhea and sinus pressure. Negative for drooling, ear discharge, ear pain, hoarse voice, postnasal drip, sinus pain, sneezing and sore throat.   Respiratory: Positive for cough, shortness of breath and wheezing. Negative for chest tightness.   Cardiovascular: Negative for chest pain, palpitations and leg swelling.  Gastrointestinal: Negative for abdominal pain, blood in stool, constipation, diarrhea and nausea.  Endocrine: Negative for polydipsia.  Genitourinary: Negative for dysuria, frequency, hematuria and urgency.  Musculoskeletal: Negative for back pain, myalgias and neck pain.  Skin: Negative for rash.  Allergic/Immunologic: Negative for environmental allergies.  Neurological: Negative for dizziness and headaches.  Hematological: Does not bruise/bleed easily.  Psychiatric/Behavioral: Negative for suicidal ideas. The patient is not nervous/anxious.     Patient Active Problem List   Diagnosis Date Noted  . Aortic atherosclerosis (HCC) 12/30/2018  . Benign hematuria 09/18/2016  . Weakness of left hand 07/02/2016  . Lipid screening 01/03/2016  . Cerebral vascular disease 09/14/2015  . Carotid artery narrowing  08/15/2015  . Bilateral carotid artery stenosis 08/15/2015  . Bradycardia 08/01/2015  . H/O transient cerebral ischemia 08/01/2015  . Combined fat and carbohydrate induced hyperlipemia 08/01/2015  . Breathlessness on exertion 07/30/2015  . Alcoholic (HCC) 04/03/2015  . Absolute anemia 04/03/2015  . 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  . Cerebral vascular accident (HCC) 04/03/2015  . HLD (hyperlipidemia) 04/03/2015  . Blood glucose elevated 04/03/2015  . Lung nodule, solitary 04/03/2015  . Non compliance with medical treatment 04/03/2015  . Lung nodule, multiple 04/03/2015  . Cerebral infarction (HCC) 04/03/2015  . Abnormal lung field 04/03/2015  . Alcohol dependence (HCC) 04/03/2015    No Known Allergies  Past Surgical History:  Procedure Laterality Date  . COLONOSCOPY  2011   normal- [facility]    Social History   Tobacco Use  . Smoking status: Current Some Day Smoker    Packs/day: 0.25    Years: 60.00    Pack years: 15.00    Types: Cigarettes    Last attempt to quit: 10/29/2018    Years since quitting: 0.2  . Smokeless tobacco: Never Used  Substance Use Topics  . Alcohol use: Yes    Alcohol/week: 1.0 standard drinks    Types: 1 Cans of beer per week  . Drug use: Yes    Comment: marijuana     Medication list has been reviewed and updated.  Current Meds  Medication Sig  . atorvastatin (LIPITOR) 80 MG tablet Take 1 tablet (80 mg total) by mouth daily.  . clopidogrel (PLAVIX) 75 MG tablet Take 1 tablet (75 mg total) by mouth daily.  . tamsulosin (FLOMAX) 0.4 MG CAPS capsule Take 1 capsule (0.4 mg total) by mouth daily.  Marland Kitchen umeclidinium-vilanterol (ANORO ELLIPTA) 62.5-25 MCG/INH  AEPB Inhale into the lungs. Dr Meredeth Ide    [facility] 2/9 Scores 10/25/2018 06/16/2018 02/12/2016 01/03/2016  PHQ - 2 Score 0 0 0 0  PHQ- 9 Score 0 0 - -    Physical Exam Vitals signs and nursing note reviewed.  HENT:     Head: Normocephalic.     Jaw: There is normal jaw occlusion.      Right Ear: Tympanic membrane, ear canal and external ear normal. No middle ear effusion. Tympanic membrane is not retracted.     Left Ear: Tympanic membrane, ear canal and external ear normal.  No middle ear effusion. Tympanic membrane is not retracted.     Nose: Nose normal.  Eyes:     General: No scleral icterus.       Right eye: No discharge.        Left eye: No discharge.     Conjunctiva/sclera: Conjunctivae normal.     Pupils: Pupils are equal, round, and reactive to light.  Neck:     Musculoskeletal: Normal range of motion and neck supple.     Thyroid: No thyromegaly.     Vascular: No JVD.     Trachea: No tracheal deviation.  Cardiovascular:     Rate and Rhythm: Normal rate and regular rhythm.     Heart sounds: Normal heart sounds. No murmur. No friction rub. No gallop.   Pulmonary:     Effort: No respiratory distress.     Breath sounds: Normal breath sounds. No wheezing or rales.  Abdominal:     General: Bowel sounds are normal.     Palpations: Abdomen is soft. There is no mass.     Tenderness: There is no abdominal tenderness. There is no guarding or rebound.  Musculoskeletal: Normal range of motion.        General: No tenderness.  Lymphadenopathy:     Cervical: No cervical adenopathy.  Skin:    General: Skin is warm.     Findings: No rash.  Neurological:     Mental Status: He is alert and oriented to person, place, and time.     Cranial Nerves: No cranial nerve deficit.     Deep Tendon Reflexes: Reflexes are normal and symmetric.     Wt Readings from Last 3 Encounters:  01/31/19 121 lb (54.9 kg)  12/30/18 119 lb (54 kg)  11/02/18 119 lb (54 kg)    BP 130/80   Pulse (!) 54   Temp 97.9 F (36.6 C) (Oral)   Ht 6\' 1"  (1.854 m)   Wt 121 lb (54.9 kg)   SpO2 98%   BMI 15.96 kg/m   Assessment and Plan: 1. Centriacinar emphysema [facility]) Patient is followed by Dr. Meredeth Ide for centrilobular emphysema.  Patient is supposed to be on Anoro and albuterol according to  his notes patient is only taking the albuterol however.  And was given a sample of Anoro and a prescription for albuterol was given. - albuterol (PROVENTIL HFA;VENTOLIN HFA) 108 (90 Base) MCG/ACT inhaler; Inhale 2 puffs into the lungs every 6 (six) hours as needed for wheezing or shortness of breath.  Dispense: 1 Inhaler; Refill: 2  2. Acute bronchitis with chronic obstructive pulmonary disease (COPD) (HCC) Patient probably has an infection as well with a productive cough with underlying bullous emphysema/COPD.  Will initiate amoxicillin 500 mg in addition to the inhalers that the patient has in his possession. - montelukast (SINGULAIR) 10 MG tablet; Take 1 tablet (10 mg total) by mouth at bedtime.  Dispense:  30 tablet; Refill: 3 - albuterol (PROVENTIL HFA;VENTOLIN HFA) 108 (90 Base) MCG/ACT inhaler; Inhale 2 puffs into the lungs every 6 (six) hours as needed for wheezing or shortness of breath.  Dispense: 1 Inhaler; Refill: 2 - amoxicillin (AMOXIL) 500 MG capsule; Take 1 capsule (500 mg total) by mouth 3 (three) times daily.  Dispense: 30 capsule; Refill: 0  3. Seasonal allergic rhinitis due to pollen Patient has seasonal allergies for which he has an enlargement of his turbinates in his nasal area patient was given Singulair 10 mg which should help both his breathing and his allergic rhinitis. - montelukast (SINGULAIR) 10 MG tablet; Take 1 tablet (10 mg total) by mouth at bedtime.  Dispense: 30 tablet; Refill: 3

## 2019-02-02 DIAGNOSIS — H40033 Anatomical narrow angle, bilateral: Secondary | ICD-10-CM | POA: Diagnosis not present

## 2019-02-02 DIAGNOSIS — H2513 Age-related nuclear cataract, bilateral: Secondary | ICD-10-CM | POA: Diagnosis not present

## 2019-02-07 ENCOUNTER — Ambulatory Visit: Payer: Medicare Other | Admitting: Urology

## 2019-02-21 ENCOUNTER — Ambulatory Visit: Payer: Medicare Other | Admitting: Urology

## 2019-03-18 ENCOUNTER — Ambulatory Visit (INDEPENDENT_AMBULATORY_CARE_PROVIDER_SITE_OTHER): Payer: Medicare HMO | Admitting: Family Medicine

## 2019-03-18 ENCOUNTER — Encounter: Payer: Self-pay | Admitting: Family Medicine

## 2019-03-18 ENCOUNTER — Other Ambulatory Visit: Payer: Self-pay

## 2019-03-18 VITALS — BP 120/62 | HR 68 | Ht 73.0 in | Wt 123.0 lb

## 2019-03-18 DIAGNOSIS — Q846 Other congenital malformations of nails: Secondary | ICD-10-CM

## 2019-03-18 DIAGNOSIS — L821 Other seborrheic keratosis: Secondary | ICD-10-CM

## 2019-03-18 DIAGNOSIS — L2084 Intrinsic (allergic) eczema: Secondary | ICD-10-CM

## 2019-03-18 NOTE — Progress Notes (Signed)
Date:  03/18/2019   Name:  Nathaniel Garcia   DOB:  11/19/1945   MRN:  [id number]   Chief Complaint: Nail Problem (noticed it turning about 2 weeks ago- half of toenail is missing)  Patient has concern about dark proximal nail bed. Patient desires dermatology evaluation.   Rash  This is a new problem. The current episode started more than 1 month ago (4-6 week). The problem has been waxing and waning since onset. The affected locations include the back. The rash is characterized by dryness and itchiness. He was exposed to nothing. Pertinent negatives include no anorexia, congestion, cough, diarrhea, eye pain, facial edema, fatigue, fever, joint pain, nail changes, rhinorrhea, shortness of breath, sore throat or vomiting. Past treatments include nothing.    Review of Systems  Constitutional: Negative for chills, fatigue and fever.  HENT: Negative for congestion, drooling, ear discharge, ear pain, rhinorrhea and sore throat.   Eyes: Negative for pain.  Respiratory: Negative for cough, shortness of breath and wheezing.   Cardiovascular: Negative for chest pain, palpitations and leg swelling.  Gastrointestinal: Negative for abdominal pain, anorexia, blood in stool, constipation, diarrhea, nausea and vomiting.  Endocrine: Negative for polydipsia.  Genitourinary: Negative for dysuria, frequency, hematuria and urgency.  Musculoskeletal: Negative for back pain, gait problem, joint pain, joint swelling, myalgias and neck pain.  Skin: Positive for rash. Negative for nail changes.  Allergic/Immunologic: Negative for environmental allergies.  Neurological: Negative for dizziness and headaches.  Hematological: Does not bruise/bleed easily.  Psychiatric/Behavioral: Negative for suicidal ideas. The patient is not nervous/anxious.     Patient Active Problem List   Diagnosis Date Noted  . Aortic atherosclerosis (HCC) 12/30/2018  . Benign hematuria 09/18/2016  . Weakness of left hand  07/02/2016  . Lipid screening 01/03/2016  . Cerebral vascular disease 09/14/2015  . Carotid artery narrowing 08/15/2015  . Bilateral carotid artery stenosis 08/15/2015  . Bradycardia 08/01/2015  . H/O transient cerebral ischemia 08/01/2015  . Combined fat and carbohydrate induced hyperlipemia 08/01/2015  . Breathlessness on exertion 07/30/2015  . Alcoholic (HCC) 04/03/2015  . Absolute anemia 04/03/2015  . 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  . Cerebral vascular accident (HCC) 04/03/2015  . HLD (hyperlipidemia) 04/03/2015  . Blood glucose elevated 04/03/2015  . Lung nodule, solitary 04/03/2015  . Non compliance with medical treatment 04/03/2015  . Lung nodule, multiple 04/03/2015  . Cerebral infarction (HCC) 04/03/2015  . Abnormal lung field 04/03/2015  . Alcohol dependence (HCC) 04/03/2015    No Known Allergies  Past Surgical History:  Procedure Laterality Date  . COLONOSCOPY  2011   normal- [facility] docs    Social History   Tobacco Use  . Smoking status: Current Some Day Smoker    Packs/day: 0.25    Years: 60.00    Pack years: 15.00    Types: Cigarettes    Last attempt to quit: 10/29/2018    Years since quitting: 0.3  . Smokeless tobacco: Never Used  Substance Use Topics  . Alcohol use: Yes    Alcohol/week: 1.0 standard drinks    Types: 1 Cans of beer per week  . Drug use: Yes    Comment: marijuana     Medication list has been reviewed and updated.  Current Meds  Medication Sig  . albuterol (PROVENTIL HFA;VENTOLIN HFA) 108 (90 Base) MCG/ACT inhaler Inhale 2 puffs into the lungs every 6 (six) hours as needed for wheezing or shortness of breath.  Marland Kitchen. atorvastatin (LIPITOR) 80 MG tablet Take 1 tablet (80  mg total) by mouth daily.  . clopidogrel (PLAVIX) 75 MG tablet Take 1 tablet (75 mg total) by mouth daily.  . montelukast (SINGULAIR) 10 MG tablet Take 1 tablet (10 mg total) by mouth at bedtime.  . tamsulosin (FLOMAX) 0.4 MG CAPS capsule Take 1 capsule (0.4 mg  total) by mouth daily.  Marland Kitchen umeclidinium-vilanterol (ANORO ELLIPTA) 62.5-25 MCG/INH AEPB Inhale into the lungs. Dr Meredeth Ide    [facility] 2/9 Scores 03/18/2019 10/25/2018 06/16/2018 02/12/2016  PHQ - 2 Score 0 0 0 0  PHQ- 9 Score 0 0 0 -    BP Readings from Last 3 Encounters:  03/18/19 120/62  01/31/19 130/80  12/30/18 116/68    Physical Exam Vitals signs and nursing note reviewed.  HENT:     Head: Normocephalic.     Right Ear: Tympanic membrane and external ear normal.     Left Ear: Tympanic membrane and external ear normal.     Nose: Nose normal. No congestion or rhinorrhea.  Eyes:     General: No scleral icterus.       Right eye: No discharge.        Left eye: No discharge.     Conjunctiva/sclera: Conjunctivae normal.     Pupils: Pupils are equal, round, and reactive to light.  Neck:     Musculoskeletal: Normal range of motion and neck supple. No neck rigidity or muscular tenderness.     Thyroid: No thyromegaly.     Vascular: No carotid bruit or JVD.     Trachea: No tracheal deviation.  Cardiovascular:     Rate and Rhythm: Normal rate and regular rhythm.     Pulses: Normal pulses.     Heart sounds: Normal heart sounds. No murmur. No friction rub. No gallop.   Pulmonary:     Effort: Pulmonary effort is normal. No respiratory distress.     Breath sounds: Normal breath sounds. No stridor. No wheezing, rhonchi or rales.  Chest:     Chest wall: No tenderness.  Abdominal:     General: Bowel sounds are normal.     Palpations: Abdomen is soft. There is no mass.     Tenderness: There is no abdominal tenderness. There is no guarding or rebound.  Musculoskeletal: Normal range of motion.        General: No tenderness.  Lymphadenopathy:     Cervical: No cervical adenopathy.  Skin:    General: Skin is warm.     Findings: No rash.  Neurological:     Mental Status: He is alert and oriented to person, place, and time.     Cranial Nerves: No cranial nerve deficit.     Deep Tendon Reflexes:  Reflexes are normal and symmetric.     Wt Readings from Last 3 Encounters:  03/18/19 123 lb (55.8 kg)  01/31/19 121 lb (54.9 kg)  12/30/18 119 lb (54 kg)    BP 120/62   Pulse 68   Ht 6\' 1"  (1.854 m)   Wt 123 lb (55.8 kg)   BMI 16.23 kg/m   Assessment and Plan: 1. Abnormal development of nail Patient had several dermatologic concerns for which he would like to see dermatology.  The first being that there is a linear delineation of the great toenail which appears to be normal nailbed proximal and perhaps in a avulsed nail above there latory referral to Dermatology is growing outward distally will refer for  dermatology to determine - [facility] 2. Intrinsic eczema Patient has an area in the center  of his back which is pruritic and dry it may be consistent with eczema or just xeroderma you.  Will refer to otology for evaluation in the meantime is suggested him to apply moisturizing lotion to the area. - Ambulatory referral to Dermatology  3. Seborrheic keratoses Patient has multiple seborrheic keratoses on his body particularly on the back and would like to have these evaluated although is not sure if there is anything else that can be done. - Ambulatory referral to Dermatology

## 2019-03-24 ENCOUNTER — Telehealth: Payer: Self-pay

## 2019-03-24 NOTE — Telephone Encounter (Signed)
Spoke to pt this morning concerning derm appt. They contacted him on the fifth of May and left a VM to return call to sched appt. I called pt to see if he received the message, he said he did and he "will give them a call back today to schedule it." Also verified that he knew where the office was.

## 2019-04-20 ENCOUNTER — Ambulatory Visit (HOSPITAL_COMMUNITY)
Admission: EM | Admit: 2019-04-20 | Discharge: 2019-04-20 | Disposition: A | Discharge status: Home / Self Care | Payer: Self-pay | Attending: Family Medicine | Admitting: Family Medicine

## 2019-04-20 ENCOUNTER — Encounter (HOSPITAL_COMMUNITY): Payer: Self-pay

## 2019-04-20 ENCOUNTER — Other Ambulatory Visit: Payer: Self-pay

## 2019-04-20 DIAGNOSIS — S61211A Laceration without foreign body of left index finger without damage to nail, initial encounter: Secondary | ICD-10-CM

## 2019-04-20 NOTE — ED Provider Notes (Signed)
[facility]   [id number] 04/20/19 Arrival Time: 1111  ASSESSMENT & PLAN:  1. Laceration of left index finger without foreign body without damage to nail, initial encounter    Wound copiously irrigated with NS then cleansed with betadine. Three steri-strips were placed to reapproximate the wound. Non-adhesive pad wrapped with Coban. To remove tomorrow morning. Wound care instructions given.  Follow-up Information    Duanne LimerickJones, Deanna C, MD.   Specialty:  Family Medicine Why:  Call to ask about your tetanus shot status. Contact information: [address] [place] [id number] [phone]          Declines Td update today.  Reviewed expectations re: course of current medical issues. Questions answered. Outlined signs and symptoms indicating need for more acute intervention. Patient verbalized understanding. After Visit Summary given.   SUBJECTIVE:  Massie KluverFrederick Douglas Heminger is a 73 y.o. male who presents with a laceration of his L mid 2nd finger. This morning. Cut while opening a plastic package. Moderate bleeding that has now stopped. On Plavix. No finger ROM loss. No extremity sensation changes or weakness. Washed at home.  Td UTD: Unknown.  ROS: As per HPI. There are no preventive care reminders to display for this patient.  OBJECTIVE:  Vitals:   04/20/19 1140  BP: 140/62  Pulse: 73  Resp: 18  Temp: 97.9 F (36.6 C)  TempSrc: Oral  SpO2: 100%     General appearance: alert; no distress Skin: curved laceration of his L 2nd mid finger; approx 1 cm; clean wound edges, no foreign bodies; without active bleeding; finger with FROM; normal capillary refill and distal sensation Psychological: alert and cooperative; normal mood and affect   No Known Allergies  Past Medical History:  Diagnosis Date  . COPD (chronic obstructive pulmonary disease) (HCC)   . Hematuria   . Hyperlipidemia   . Stroke [facility](HCC)    Social History   Socioeconomic History   . Marital status: Legally Separated    Spouse name: Not on file  . Number of children: 3  . Years of education: some college  . Highest education level: 12th grade  Occupational History  . Occupation: Retired  Engineer, productionocial Needs  . Financial resource strain: Not hard at all  . Food insecurity:    Worry: Never true    Inability: Never true  . Transportation needs:    Medical: No    Non-medical: No  Tobacco Use  . Smoking status: Current Some Day Smoker    Packs/day: 0.25    Years: 60.00    Pack years: 15.00    Types: Cigarettes    Last attempt to quit: 10/29/2018    Years since quitting: 0.4  . Smokeless tobacco: Never Used  Substance and Sexual Activity  . Alcohol use: Yes    Alcohol/week: 1.0 standard drinks    Types: 1 Cans of beer per week  . Drug use: Yes    Comment: marijuana  . Sexual activity: Never  Lifestyle  . Physical activity:    Days per week: 0 days    Minutes per session: 0 min  . Stress: Not at all  Relationships  . Social connections:    Talks on phone: Patient refused    Gets together: Patient refused    Attends religious service: Patient refused    Active member of club or organization: Patient refused    Attends meetings of clubs or organizations: Patient refused    Relationship status: Separated  Other Topics Concern  .  Not on file  Social History Narrative  . Not on file         Mardella Layman, MD 04/20/19 1224

## 2019-04-20 NOTE — ED Triage Notes (Signed)
Patient presents to Urgent Care with complaints of finger laceration to his left index finger since this morning. Patient reports he is on Plavix, tetanus not up to date.

## 2019-04-21 DIAGNOSIS — B360 Pityriasis versicolor: Secondary | ICD-10-CM | POA: Diagnosis not present

## 2019-04-21 DIAGNOSIS — B351 Tinea unguium: Secondary | ICD-10-CM | POA: Diagnosis not present

## 2019-04-25 ENCOUNTER — Ambulatory Visit: Payer: Medicare Other | Admitting: Urology

## 2019-05-27 ENCOUNTER — Ambulatory Visit: Payer: Medicare HMO | Admitting: Family Medicine

## 2019-05-30 ENCOUNTER — Encounter: Payer: Self-pay | Admitting: Family Medicine

## 2019-05-30 ENCOUNTER — Ambulatory Visit
Admission: RE | Admit: 2019-05-30 | Discharge: 2019-05-30 | Disposition: A | Discharge status: Home / Self Care | Payer: Medicare HMO | Attending: Family Medicine | Admitting: Family Medicine

## 2019-05-30 ENCOUNTER — Ambulatory Visit (INDEPENDENT_AMBULATORY_CARE_PROVIDER_SITE_OTHER): Payer: Medicare HMO | Admitting: Family Medicine

## 2019-05-30 ENCOUNTER — Telehealth: Payer: Self-pay | Admitting: *Deleted

## 2019-05-30 ENCOUNTER — Other Ambulatory Visit: Payer: Self-pay

## 2019-05-30 ENCOUNTER — Ambulatory Visit
Admission: RE | Admit: 2019-05-30 | Discharge: 2019-05-30 | Disposition: A | Discharge status: Home / Self Care | Payer: Medicare HMO | Source: Ambulatory Visit | Attending: Family Medicine | Admitting: Family Medicine

## 2019-05-30 VITALS — BP 110/60 | HR 80 | Ht 73.0 in | Wt 116.0 lb

## 2019-05-30 DIAGNOSIS — Z1211 Encounter for screening for malignant neoplasm of colon: Secondary | ICD-10-CM | POA: Diagnosis not present

## 2019-05-30 DIAGNOSIS — M545 Low back pain, unspecified: Secondary | ICD-10-CM

## 2019-05-30 DIAGNOSIS — R918 Other nonspecific abnormal finding of lung field: Secondary | ICD-10-CM | POA: Diagnosis not present

## 2019-05-30 DIAGNOSIS — B354 Tinea corporis: Secondary | ICD-10-CM | POA: Diagnosis not present

## 2019-05-30 DIAGNOSIS — F1099 Alcohol use, unspecified with unspecified alcohol-induced disorder: Secondary | ICD-10-CM

## 2019-05-30 DIAGNOSIS — R634 Abnormal weight loss: Secondary | ICD-10-CM | POA: Diagnosis not present

## 2019-05-30 DIAGNOSIS — Z87891 Personal history of nicotine dependence: Secondary | ICD-10-CM

## 2019-05-30 DIAGNOSIS — E785 Hyperlipidemia, unspecified: Secondary | ICD-10-CM | POA: Diagnosis not present

## 2019-05-30 DIAGNOSIS — J432 Centrilobular emphysema: Secondary | ICD-10-CM

## 2019-05-30 DIAGNOSIS — F17210 Nicotine dependence, cigarettes, uncomplicated: Secondary | ICD-10-CM

## 2019-05-30 DIAGNOSIS — Z122 Encounter for screening for malignant neoplasm of respiratory organs: Secondary | ICD-10-CM

## 2019-05-30 DIAGNOSIS — N401 Enlarged prostate with lower urinary tract symptoms: Secondary | ICD-10-CM | POA: Diagnosis not present

## 2019-05-30 LAB — HEMOCCULT GUIAC POC 1CARD (OFFICE): Fecal Occult Blood, POC: NEGATIVE

## 2019-05-30 NOTE — Telephone Encounter (Signed)
Received referral for initial lung cancer screening scan. Contacted patient and obtained smoking history,(current, 30 pack year) as well as answering questions related to screening process. Patient denies signs of lung cancer such as weight loss or hemoptysis. Patient denies comorbidity that would prevent curative treatment if lung cancer were found. Patient is scheduled for shared decision making visit and CT scan on 06/06/19 at 2pm.

## 2019-05-30 NOTE — Progress Notes (Signed)
Date:  05/30/2019   Name:  Nathaniel KluverFrederick Douglas Plouffe   DOB:  09/03/1946   MRN:  [id number]   Chief Complaint: Back Pain (lower back pain in the middle)  Back Pain This is a new problem. The current episode started 1 to 4 weeks ago (couple of weeks). The problem occurs daily. The problem has been waxing and waning since onset. The pain is present in the lumbar spine. The quality of the pain is described as aching. The pain does not radiate. The pain is moderate. Exacerbated by: from lying down to sitting up. Associated symptoms include weight loss. Pertinent negatives include no abdominal pain, bladder incontinence, bowel incontinence, chest pain, dysuria, fever, headaches, leg pain, numbness, paresis, paresthesias, pelvic pain, perianal numbness, tingling or weakness. (Night sweats) Treatments tried: otc. The treatment provided mild relief.  Thyroid Problem Presents for initial (for weight loss) visit. Symptoms include cold intolerance and weight loss. Patient reports no anxiety, constipation, depressed mood, diaphoresis, diarrhea, dry skin, fatigue, hair loss, heat intolerance, hoarse voice, leg swelling, nail problem, palpitations, tremors, visual change or weight gain. (DOE) The symptoms have been stable.    Review of Systems  Constitutional: Positive for weight loss. Negative for chills, diaphoresis, fatigue, fever and weight gain.  HENT: Negative for drooling, ear discharge, ear pain, hoarse voice and sore throat.   Respiratory: Negative for cough, shortness of breath and wheezing.   Cardiovascular: Negative for chest pain, palpitations and leg swelling.  Gastrointestinal: Negative for abdominal pain, blood in stool, bowel incontinence, constipation, diarrhea and nausea.  Endocrine: Positive for cold intolerance. Negative for heat intolerance and polydipsia.  Genitourinary: Negative for bladder incontinence, dysuria, frequency, hematuria, pelvic pain and urgency.  Musculoskeletal: Positive  for back pain. Negative for myalgias and neck pain.  Skin: Negative for rash.  Allergic/Immunologic: Negative for environmental allergies.  Neurological: Negative for dizziness, tingling, tremors, weakness, numbness, headaches and paresthesias.  Hematological: Does not bruise/bleed easily.  Psychiatric/Behavioral: Negative for suicidal ideas. The patient is not nervous/anxious.     Patient Active Problem List   Diagnosis Date Noted  . Aortic atherosclerosis (HCC) 12/30/2018  . Benign hematuria 09/18/2016  . Weakness of left hand 07/02/2016  . Lipid screening 01/03/2016  . Cerebral vascular disease 09/14/2015  . Carotid artery narrowing 08/15/2015  . Bilateral carotid artery stenosis 08/15/2015  . Bradycardia 08/01/2015  . H/O transient cerebral ischemia 08/01/2015  . Combined fat and carbohydrate induced hyperlipemia 08/01/2015  . Breathlessness on exertion 07/30/2015  . Alcoholic (HCC) 04/03/2015  . Absolute anemia 04/03/2015  . 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  . Cerebral vascular accident (HCC) 04/03/2015  . HLD (hyperlipidemia) 04/03/2015  . Blood glucose elevated 04/03/2015  . Lung nodule, solitary 04/03/2015  . Non compliance with medical treatment 04/03/2015  . Lung nodule, multiple 04/03/2015  . Cerebral infarction (HCC) 04/03/2015  . Abnormal lung field 04/03/2015  . Alcohol dependence (HCC) 04/03/2015    No Known Allergies  Past Surgical History:  Procedure Laterality Date  . COLONOSCOPY  2011   normal- MD docs    Social History   Tobacco Use  . Smoking status: Current Some Day Smoker    Packs/day: 0.25    Years: 60.00    Pack years: 15.00    Types: Cigarettes    Last attempt to quit: 10/29/2018    Years since quitting: 0.5  . Smokeless tobacco: Never Used  Substance Use Topics  . Alcohol use: Yes    Alcohol/week: 1.0 standard drinks  Types: 1 Cans of beer per week  . Drug use: Yes    Comment: marijuana     Medication list has been  reviewed and updated.  Current Meds  Medication Sig  . albuterol (PROVENTIL HFA;VENTOLIN HFA) 108 (90 Base) MCG/ACT inhaler Inhale 2 puffs into the lungs every 6 (six) hours as needed for wheezing or shortness of breath.  . umeclidinium-vilanterol (ANORO ELLIPTA) 62.5-25 MCG/INH AEPB Inhale into the lungs. Dr Meredeth IdeFleming    [facility] Scores 03/18/2019 10/25/2018 06/16/2018 02/12/2016  PHQ - 2 Score 0 0 0 0  PHQ- 9 Score 0 0 0 -    BP Readings from Last 3 Encounters:  05/30/19 110/60  04/20/19 140/62  03/18/19 120/62    Physical Exam Vitals signs and nursing note reviewed.  HENT:     Head: Normocephalic.     Jaw: There is normal jaw occlusion.     Right Ear: Tympanic membrane, ear canal and external ear normal. There is no impacted cerumen.     Left Ear: Tympanic membrane, ear canal and external ear normal. There is no impacted cerumen.     Nose: Nose normal. No congestion or rhinorrhea.     Right Turbinates: Not enlarged.     Mouth/Throat:     Lips: Pink.     Mouth: Mucous membranes are moist.  Eyes:     General: No scleral icterus.       Right eye: No discharge.        Left eye: No discharge.     Conjunctiva/sclera: Conjunctivae normal.     Pupils: Pupils are equal, round, and reactive to light.  Neck:     Musculoskeletal: Normal range of motion and neck supple.     Thyroid: No thyroid mass, thyromegaly or thyroid tenderness.     Vascular: No JVD.     Trachea: No tracheal deviation.  Cardiovascular:     Rate and Rhythm: Normal rate and regular rhythm.     Heart sounds: Normal heart sounds. No murmur. No friction rub. No gallop.   Pulmonary:     Effort: No respiratory distress.     Breath sounds: Normal breath sounds. No stridor. No wheezing, rhonchi or rales.  Chest:     Chest wall: No tenderness.  Abdominal:     General: Bowel sounds are normal.     Palpations: Abdomen is soft. There is no hepatomegaly, splenomegaly or mass.     Tenderness: There is no abdominal  tenderness. There is no guarding or rebound.  Genitourinary:    Prostate: Normal. Not enlarged, not tender and no nodules present.     Rectum: Normal. Guaiac result negative. No mass.  Musculoskeletal: Normal range of motion.        General: No tenderness.  Lymphadenopathy:     Cervical: No cervical adenopathy.     Right cervical: No superficial, deep or posterior cervical adenopathy.    Left cervical: No superficial, deep or posterior cervical adenopathy.  Skin:    General: Skin is warm.     Capillary Refill: Capillary refill takes less than 2 seconds.     Findings: No rash.  Neurological:     Mental Status: He is alert and oriented to person, place, and time.     Cranial Nerves: No cranial nerve deficit.     Deep Tendon Reflexes: Reflexes are normal and symmetric.     Wt Readings from Last 3 Encounters:  05/30/19 116 lb (52.6 kg)  03/18/19 123 lb (55.8 kg)  01/31/19 121 lb (54.9 kg)    BP 110/60   Pulse 80   Ht 6\' 1"  (1.854 m)   Wt 116 lb (52.6 kg)   BMI 15.30 kg/m   Assessment and Plan:  1. Alcohol use with alcohol-induced disorder [facility]) Patient with history of alcohol use disorder who admits to drinking only 1 beer a day.  Is stated that this is probably within some moderation but would be best if he was to discontinue completely.  2. Acute low back pain without sciatica, unspecified back pain laterality Acute pain.  Patient has developed pain in the lumbar area without history of trauma.  Will x-ray to rule out a compression fracture or any other possible osteoblastic/osteoclastic lesions. - DG Lumbar Spine Complete; Future   3. Weight loss, non-intentional Patient once again is continued to lose weight and although this may be secondary to emphysematous disease and/or poor nutrition associated with alcohol.  Will recheck a thyroid panel CBC CMP as well as PSA.  We will be getting a x-ray of the lumbar spine to rule out any possibility of metastatic disease guaiac was  noted to be negative. - Thyroid Panel With TSH - CBC with Differential/Platelet - Comprehensive metabolic panel - DG Lumbar Spine Complete; Future - PSA - POCT Occult Blood Stool  4. Tinea corporis Patient has a skin rash beneath the right axillary area which he may take Lotrisone twice a day.  5. Cigarette nicotine dependence without complication Patient continues to smoke and appears to have some desire to quit but once a quick and easy method of like just the pill without the commitment to cessation.  Told patient this is a matter of time and that when he has decided completely quit smoking will be glad to assist him.  6. 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[place]) Patient with centrilobular emphysema for which he continues to smoke it was discussed with the patient the necessity to come completely quit at this time and patient will give some consideration.  7. Lung nodule, multiple Lung nodule perhaps more than one was noted on previous CTs which is suggested to be stable but patient does have a history of smoking with weight loss and will consult Hilliard Clark about spiral CT of the chest in the near future.  8. Colon cancer screening Rectal exam noted prostate to be normal size shape and consistency.  Guaiac was noted to be negative. - POCT Occult Blood Stool

## 2019-05-31 LAB — CBC WITH DIFFERENTIAL/PLATELET
Basophils Absolute: 0.1 10*3/uL (ref 0.0–0.2)
Basos: 1 %
EOS (ABSOLUTE): 0.2 10*3/uL (ref 0.0–0.4)
Eos: 3 %
Hematocrit: 44.4 % (ref 37.5–51.0)
Hemoglobin: 14.9 g/dL (ref 13.0–17.7)
Immature Grans (Abs): 0 10*3/uL (ref 0.0–0.1)
Immature Granulocytes: 0 %
Lymphocytes Absolute: 1.6 10*3/uL (ref 0.7–3.1)
Lymphs: 25 %
MCH: 32.3 pg (ref 26.6–33.0)
MCHC: 33.6 g/dL (ref 31.5–35.7)
MCV: 96 fL (ref 79–97)
Monocytes Absolute: 0.6 10*3/uL (ref 0.1–0.9)
Monocytes: 9 %
Neutrophils Absolute: 4.1 10*3/uL (ref 1.4–7.0)
Neutrophils: 62 %
Platelets: 220 10*3/uL (ref 150–450)
RBC: 4.61 x10E6/uL (ref 4.14–5.80)
RDW: 12.2 % (ref 11.6–15.4)
WBC: 6.5 10*3/uL (ref 3.4–10.8)

## 2019-05-31 LAB — COMPREHENSIVE METABOLIC PANEL
ALT: 13 IU/L (ref 0–44)
AST: 19 IU/L (ref 0–40)
Albumin/Globulin Ratio: 1.5 (ref 1.2–2.2)
Albumin: 4.4 g/dL (ref 3.7–4.7)
Alkaline Phosphatase: 72 IU/L (ref 39–117)
BUN/Creatinine Ratio: 12 (ref 10–24)
BUN: 15 mg/dL (ref 8–27)
Bilirubin Total: 0.4 mg/dL (ref 0.0–1.2)
CO2: 24 mmol/L (ref 20–29)
Calcium: 9.8 mg/dL (ref 8.6–10.2)
Chloride: 105 mmol/L (ref 96–106)
Creatinine, Ser: 1.22 mg/dL (ref 0.76–1.27)
GFR calc Af Amer: 68 mL/min/{1.73_m2} (ref >59)
GFR calc non Af Amer: 58 mL/min/{1.73_m2} — ABNORMAL LOW (ref >59)
Globulin, Total: 3 g/dL (ref 1.5–4.5)
Glucose: 87 mg/dL (ref 65–99)
Potassium: 5.5 mmol/L — ABNORMAL HIGH (ref 3.5–5.2)
Sodium: 144 mmol/L (ref 134–144)
Total Protein: 7.4 g/dL (ref 6.0–8.5)

## 2019-05-31 LAB — THYROID PANEL WITH TSH
Free Thyroxine Index: 2.3 (ref 1.2–4.9)
T3 Uptake Ratio: 26 % (ref 24–39)
T4, Total: 8.7 ug/dL (ref 4.5–12.0)
TSH: 1.65 u[IU]/mL (ref 0.450–4.500)

## 2019-05-31 LAB — PSA: Prostate Specific Ag, Serum: 1.9 ng/mL (ref 0.0–4.0)

## 2019-06-03 ENCOUNTER — Other Ambulatory Visit: Payer: Self-pay

## 2019-06-03 ENCOUNTER — Telehealth: Payer: Self-pay | Admitting: Family Medicine

## 2019-06-03 ENCOUNTER — Encounter: Payer: Self-pay | Admitting: Nurse Practitioner

## 2019-06-03 DIAGNOSIS — I679 Cerebrovascular disease, unspecified: Secondary | ICD-10-CM

## 2019-06-03 DIAGNOSIS — N400 Enlarged prostate without lower urinary tract symptoms: Secondary | ICD-10-CM

## 2019-06-03 DIAGNOSIS — I700 Atherosclerosis of aorta: Secondary | ICD-10-CM

## 2019-06-03 DIAGNOSIS — J209 Acute bronchitis, unspecified: Secondary | ICD-10-CM

## 2019-06-03 DIAGNOSIS — J440 Chronic obstructive pulmonary disease with acute lower respiratory infection: Secondary | ICD-10-CM

## 2019-06-03 DIAGNOSIS — E785 Hyperlipidemia, unspecified: Secondary | ICD-10-CM

## 2019-06-03 DIAGNOSIS — J301 Allergic rhinitis due to pollen: Secondary | ICD-10-CM

## 2019-06-03 MED ORDER — TAMSULOSIN HCL 0.4 MG PO CAPS: 0.4000 mg | ORAL_CAPSULE | Freq: Every day | ORAL | 1 refills | Status: DC

## 2019-06-03 MED ORDER — MONTELUKAST SODIUM 10 MG PO TABS: 10.0000 mg | ORAL_TABLET | Freq: Every day | ORAL | 3 refills | Status: DC

## 2019-06-03 MED ORDER — ATORVASTATIN CALCIUM 80 MG PO TABS: 80.0000 mg | ORAL_TABLET | Freq: Every day | ORAL | 1 refills | Status: DC

## 2019-06-03 MED ORDER — CLOPIDOGREL BISULFATE 75 MG PO TABS: 75.0000 mg | ORAL_TABLET | Freq: Every day | ORAL | 1 refills | Status: DC

## 2019-06-03 NOTE — Telephone Encounter (Signed)
Pt called in wanting all of his medications called in, he was here at the beginning of the week and was under the impression they would be called in.

## 2019-06-03 NOTE — Telephone Encounter (Signed)
Sent in

## 2019-06-06 ENCOUNTER — Other Ambulatory Visit: Payer: Self-pay

## 2019-06-06 ENCOUNTER — Ambulatory Visit
Admission: RE | Admit: 2019-06-06 | Discharge: 2019-06-06 | Disposition: A | Discharge status: Home / Self Care | Payer: Medicare HMO | Source: Ambulatory Visit | Attending: Nurse Practitioner | Admitting: Nurse Practitioner

## 2019-06-06 ENCOUNTER — Inpatient Hospital Stay: Payer: Medicare HMO | Attending: Nurse Practitioner | Admitting: Nurse Practitioner

## 2019-06-06 DIAGNOSIS — Z122 Encounter for screening for malignant neoplasm of respiratory organs: Secondary | ICD-10-CM | POA: Insufficient documentation

## 2019-06-06 DIAGNOSIS — Z87891 Personal history of nicotine dependence: Secondary | ICD-10-CM

## 2019-06-06 DIAGNOSIS — F17210 Nicotine dependence, cigarettes, uncomplicated: Secondary | ICD-10-CM | POA: Diagnosis not present

## 2019-06-06 NOTE — Progress Notes (Signed)
Virtual Visit via Video Enabled Telemedicine Note   I connected with Nathaniel Garcia on 06/06/19 at 2:15 PM EST by video enabled telemedicine visit and verified that I am speaking with the correct person using two identifiers.   I discussed the limitations, risks, security and privacy concerns of performing an evaluation and management service by telemedicine and the availability of in-person appointments. I also discussed with the patient that there may be a patient responsible charge related to this service. The patient expressed understanding and agreed to proceed.   Other persons participating in the visit and their role in the encounter: Burgess Estelle, RN- checking in patient & navigation  Patient's location: clinic  Provider's location: home  Chief Complaint: Low Dose CT Screening  Patient agreed to evaluation by telemedicine to discuss shared decision making for consideration of low dose CT lung cancer screening.    In accordance with CMS guidelines, patient has met eligibility criteria including age, absence of signs or symptoms of lung cancer.  Social History   Tobacco Use  . Smoking status: Current Some Day Smoker    Packs/day: 0.50    Years: 60.00    Pack years: 30.00    Types: Cigarettes  . Smokeless tobacco: Never Used  Substance Use Topics  . Alcohol use: Yes    Alcohol/week: 1.0 standard drinks    Types: 1 Cans of beer per week     A shared decision-making session was conducted prior to the performance of CT scan. This includes one or more decision aids, includes benefits and harms of screening, follow-up diagnostic testing, over-diagnosis, false positive rate, and total radiation exposure.   Counseling on the importance of adherence to annual lung cancer LDCT screening, impact of co-morbidities, and ability or willingness to undergo diagnosis and treatment is imperative for compliance of the program.   Counseling on the importance of continued smoking  cessation for former smokers; the importance of smoking cessation for current smokers, and information about tobacco cessation interventions have been given to patient including [place] and [facility] [employer] [place] programs.   Written order for lung cancer screening with LDCT has been given to the patient and any and all questions have been answered to the best of my abilities.    Yearly follow up will be coordinated by Burgess Estelle, Thoracic Navigator.  I discussed the assessment and treatment plan with the patient. The patient was provided an opportunity to ask questions and all were answered. The patient agreed with the plan and demonstrated an understanding of the instructions.   The patient was advised to call back or seek an in-person evaluation if the symptoms worsen or if the condition fails to improve as anticipated.   I provided 15 minutes of face-to-face video visit time during this encounter, and > 50% was spent counseling as documented under my assessment & plan.   Beckey Rutter, DNP, AGNP-C [place] [facility] [phone] (work cell) [phone] (office)

## 2019-06-07 ENCOUNTER — Encounter: Payer: Self-pay | Admitting: *Deleted

## 2019-06-12 NOTE — Progress Notes (Signed)
06/13/2019 11:15 AM   Nathaniel KluverFrederick Douglas Garcia 05/17/1946 [id number]  Referring provider: Duanne LimerickJones, Deanna C, MD [address] [place] [id number]  Chief Complaint  Patient presents with  . Benign Prostatic Hypertrophy    HPI: 73 yo male with a history of hematuria, increase in PSA velocity and irregular prostate gland who presents for follow up.  History of hematuria (high risk) Smoker.  CTU 09/2016 revealed adrenal glands are unremarkable. No urinary stones. Ureters are decompressed. No filling defects in the intrarenal collecting systems, ureters or bladder.Prostate is enlarged and slightly indents the bladder base. He underwent cystoscopy in January 2018 and no worrisome findings were discovered.  RUS on 08/12/2018 revealed right kidney is rather small with slight increase in renal echogenicity. Question renal artery stenosis as a potential etiology for these findings. In this regard, question whether patient is hypertensive. Note that similar changes of this nature are not noted on the left.  No obstructing focus in either kidney.  No renal mass evident.  Mildly prominent prostate with mild inhomogeneous echotexture within the prostate.    BPH WITH LUTS  (prostate and/or bladder) IPSS score: 2/0     Previous score: 4/1   Previous PVR: 0 mL  Major complaint(s):  No complaints.  Denies any dysuria, hematuria or suprapubic pain.   Currently taking: tamsulosin 0.4 mg daily.   Denies any recent fevers, chills, nausea or vomiting.    IPSS    Row Name 06/13/19 1100         International Prostate Symptom Score   How often have you had the sensation of not emptying your bladder?  Less than 1 in 5     How often have you had to urinate less than every two hours?  Not at All     How often have you found you stopped and started again several times when you urinated?  Not at All     How often have you found it difficult to postpone urination?  Not at All     How often  have you had a weak urinary stream?  Not at All     How often have you had to strain to start urination?  Not at All     How many times did you typically get up at night to urinate?  1 Time     Total IPSS Score  2       Quality of Life due to urinary symptoms   If you were to spend the rest of your life with your urinary condition just the way it is now how would you feel about that?  Delighted        Score:  1-7 Mild 8-19 Moderate 20-35 Severe     PMH: Past Medical History:  Diagnosis Date  . COPD (chronic obstructive pulmonary disease) (HCC)   . Hematuria   . Hyperlipidemia   . Stroke [facility](HCC)     Surgical History: Past Surgical History:  Procedure Laterality Date  . COLONOSCOPY  2011   normal- MD docs    Home Medications:  Allergies as of 06/13/2019   No Known Allergies     Medication List       Accurate as of June 13, 2019 11:15 AM. If you have any questions, ask your nurse or doctor.        albuterol 108 (90 Base) MCG/ACT inhaler Commonly known as: VENTOLIN HFA Inhale 2 puffs into the lungs every 6 (six) hours  as needed for wheezing or shortness of breath.   atorvastatin 80 MG tablet Commonly known as: LIPITOR Take 1 tablet (80 mg total) by mouth daily.   clopidogrel 75 MG tablet Commonly known as: PLAVIX Take 1 tablet (75 mg total) by mouth daily.   ketoconazole 2 % cream Commonly known as: NIZORAL APP EXT AA QD   montelukast 10 MG tablet Commonly known as: SINGULAIR Take 1 tablet (10 mg total) by mouth at bedtime.   tamsulosin 0.4 MG Caps capsule Commonly known as: FLOMAX Take 1 capsule (0.4 mg total) by mouth daily.   umeclidinium-vilanterol 62.5-25 MCG/INH Aepb Commonly known as: ANORO ELLIPTA Inhale into the lungs. Dr Meredeth IdeFleming       Allergies: No Known Allergies  Family History: Family History  Family history unknown: Yes    Social History:  reports that he has been smoking cigarettes. He has a 30.00 pack-year smoking history.  He has never used smokeless tobacco. He reports current alcohol use of about 1.0 standard drinks of alcohol per week. He reports current drug use.  ROS: UROLOGY Frequent Urination?: No Hard to postpone urination?: No Burning/pain with urination?: No Get up at night to urinate?: Yes Leakage of urine?: No Urine stream starts and stops?: No Trouble starting stream?: No Do you have to strain to urinate?: No Blood in urine?: No Urinary tract infection?: No Sexually transmitted disease?: No Injury to kidneys or bladder?: No Painful intercourse?: No Weak stream?: No Erection problems?: No Penile pain?: No  Gastrointestinal Nausea?: No Vomiting?: No Indigestion/heartburn?: No Diarrhea?: No Constipation?: No  Constitutional Fever: No Night sweats?: No Weight loss?: No Fatigue?: No  Skin Skin rash/lesions?: No Itching?: No  Eyes Blurred vision?: No Double vision?: No  Ears/Nose/Throat Sore throat?: No Sinus problems?: No  Hematologic/Lymphatic Swollen glands?: No Easy bruising?: No  Cardiovascular Leg swelling?: No Chest pain?: No  Respiratory Cough?: No Shortness of breath?: No  Endocrine Excessive thirst?: No  Musculoskeletal Back pain?: No Joint pain?: No  Neurological Headaches?: No Dizziness?: No  Psychologic Depression?: No Anxiety?: No  Physical Exam: BP (!) 107/57 (BP Location: Left Arm, Patient Position: Sitting, Cuff Size: Normal)   Pulse 68   Ht 6\' 1"  (1.854 m)   Wt 116 lb (52.6 kg)   BMI 15.30 kg/m   Constitutional:  Well nourished. Alert and oriented, No acute distress. HEENT: [place] AT, moist mucus membranes.  Trachea midline, no masses. Cardiovascular: No clubbing, cyanosis, or edema. Respiratory: Normal respiratory effort, no increased work of breathing. GI: Abdomen is soft, non tender, non distended, no abdominal masses. Liver and spleen not palpable.  No hernias appreciated.  Stool sample for occult testing is not indicated.    GU: No CVA tenderness.  No bladder fullness or masses.  Patient with circumcised phallus. Urethral meatus is patent.  No penile discharge. No penile lesions or rashes. Scrotum without lesions, cysts, rashes and/or edema.  Testicles are located scrotally bilaterally. No masses are appreciated in the testicles. Left and right epididymis are normal. Rectal: Patient with  normal sphincter tone. Anus and perineum without scarring or rashes. No rectal masses are appreciated. Prostate is approximately 45 grams, firmer in the right lobe, no nodules are appreciated.  Skin: No rashes, bruises or suspicious lesions. Lymph: No inguinal adenopathy. Neurologic: Grossly intact, no focal deficits, moving all 4 extremities. Psychiatric: Normal mood and affect.   Laboratory Data: PSA History  1.0 in 05/2014  0.9 in 02/2016  2.0 in 08/2016  0.82 in 05/2017  0.88  in 11/2017   1.05 in 07/2018  1.9 in 05/2019  Lab Results  Component Value Date   CREATININE 1.22 05/30/2019       Component Value Date/Time   CHOL 177 06/29/2018 1043   HDL 54 06/29/2018 1043   CHOLHDL 3.3 06/29/2018 1043   LDLCALC 110 (H) 06/29/2018 1043    I have reviewed the labs   Assessment & Plan:    1. History of hematuria Hematuria work up completed in 05/2017 - findings positive for BPH No report of gross hematuria  UA today pending Patient to report any gross hematuria in the interim  2. BPH with LUTS IPSS score is 2/0, it is stable Continue conservative management, avoiding bladder irritants and timed voiding's Continue tamsulosin 0.4 mg daily; refills given RTC in 6 months for IPSS, PSA and exam               Return in about 6 months (around 12/14/2019) for IPSS, PSA and exam.  These notes generated with voice recognition software. I apologize for typographical errors.  Royden Purl  [facility] [address] [place], [place] [address] [phone]  Addendum:   Nathaniel Garcia urine was positive for 6-10 RBC's and urine culture was negative.  Encouraged patient to undergo a cystoscopy at this time to rule out a malignancy in the bladder.  He agrees and will schedule, but he would like a Valium prior to the procedure.  I have instructed him to take the Valium 30 minutes prior to the cysto and to have a driver with him.

## 2019-06-13 ENCOUNTER — Other Ambulatory Visit: Payer: Self-pay

## 2019-06-13 ENCOUNTER — Other Ambulatory Visit
Admission: RE | Admit: 2019-06-13 | Discharge: 2019-06-13 | Disposition: A | Discharge status: Home / Self Care | Payer: Medicare HMO | Attending: Urology | Admitting: Urology

## 2019-06-13 ENCOUNTER — Ambulatory Visit (INDEPENDENT_AMBULATORY_CARE_PROVIDER_SITE_OTHER): Payer: Medicare HMO | Admitting: Urology

## 2019-06-13 ENCOUNTER — Encounter: Payer: Self-pay | Admitting: Urology

## 2019-06-13 VITALS — BP 107/57 | HR 68 | Ht 73.0 in | Wt 116.0 lb

## 2019-06-13 DIAGNOSIS — Z87448 Personal history of other diseases of urinary system: Secondary | ICD-10-CM

## 2019-06-13 DIAGNOSIS — R3129 Other microscopic hematuria: Secondary | ICD-10-CM

## 2019-06-13 DIAGNOSIS — N401 Enlarged prostate with lower urinary tract symptoms: Secondary | ICD-10-CM

## 2019-06-13 DIAGNOSIS — N138 Other obstructive and reflux uropathy: Secondary | ICD-10-CM | POA: Diagnosis not present

## 2019-06-13 LAB — URINALYSIS, COMPLETE (UACMP) WITH MICROSCOPIC
Bacteria, UA: NONE SEEN
Bilirubin Urine: NEGATIVE
Glucose, UA: NEGATIVE mg/dL
Ketones, ur: NEGATIVE mg/dL
Nitrite: NEGATIVE
Protein, ur: NEGATIVE mg/dL
Specific Gravity, Urine: 1.015 (ref 1.005–1.030)
Squamous Epithelial / LPF: NONE SEEN (ref 0–5)
pH: 5.5 (ref 5.0–8.0)

## 2019-06-14 LAB — URINE CULTURE: Culture: NO GROWTH

## 2019-06-20 ENCOUNTER — Ambulatory Visit (INDEPENDENT_AMBULATORY_CARE_PROVIDER_SITE_OTHER): Payer: Medicare HMO

## 2019-06-20 ENCOUNTER — Telehealth: Payer: Self-pay | Admitting: Urology

## 2019-06-20 VITALS — Ht 73.0 in | Wt 116.0 lb

## 2019-06-20 DIAGNOSIS — Z0000 Encounter for general adult medical examination without abnormal findings: Secondary | ICD-10-CM | POA: Diagnosis not present

## 2019-06-20 NOTE — Progress Notes (Signed)
Subjective:   Nathaniel Garcia is a 73 y.o. male who presents for Medicare Annual/Subsequent preventive examination.  Virtual Visit via Telephone Note  I connected with Nathaniel Garcia on 06/20/19 at  2:00 PM EDT by telephone and verified that I am speaking with the correct person using two identifiers.  Medicare Annual Wellness visit completed telephonically due to Covid-19 pandemic.   Location: Patient: home Provider: office   I discussed the limitations, risks, security and privacy concerns of performing an evaluation and management service by telephone and the availability of in person appointments. The patient expressed understanding and agreed to proceed.  Some vital signs may be absent or patient reported. Pt does not have blood pressure monitor at home.     Reather LittlerKasey Harper Smoker, LPN  Review of Systems:   Cardiac Risk Factors include: advanced age (>1755men, 79>65 women);dyslipidemia;male gender;smoking/ tobacco exposure     Objective:    Vitals: Ht 6\' 1"  (1.854 m)   Wt 116 lb (52.6 kg)   BMI 15.30 kg/m   Body mass index is 15.3 kg/m.  Advanced Directives 06/20/2019 06/16/2018 07/18/2015  Does Patient Have a Medical Advance Directive? No No No  Would patient like information on creating a medical advance directive? No - Patient declined Yes (MAU/Ambulatory/Procedural Areas - Information given) No - patient declined information    Tobacco Social History   Tobacco Use  Smoking Status Current Some Day Smoker  . Packs/day: 0.50  . Years: 60.00  . Pack years: 30.00  . Types: Cigarettes  Smokeless Tobacco Never Used  Tobacco Comment   [place] smoking cessation information provided     Ready to quit: Yes Counseling given: Yes Comment: [place] smoking cessation information provided   Clinical Intake:  Pre-visit preparation completed: Yes  Pain : No/denies pain     BMI - recorded: 15.3 Nutritional Status: BMI <19  Underweight Nutritional  Risks: None Diabetes: No  How often do you need to have someone help you when you read instructions, pamphlets, or other written materials from your doctor or pharmacy?: 1 - Never  Interpreter Needed?: No  Information entered by :: Reather LittlerKasey Prabhnoor Ellenberger LPN  Past Medical History:  Diagnosis Date  . COPD (chronic obstructive pulmonary disease) (HCC)   . Hematuria   . Hyperlipidemia   . Stroke [facility](HCC)    Past Surgical History:  Procedure Laterality Date  . COLONOSCOPY  2011   normal- [facility] docs   Family History  Family history unknown: Yes   Social History   Socioeconomic History  . Marital status: Legally Separated    Spouse name: Not on file  . Number of children: 3  . Years of education: some college  . Highest education level: 12th grade  Occupational History  . Occupation: Retired  Engineer, productionocial Needs  . Financial resource strain: Not hard at all  . Food insecurity    Worry: Never true    Inability: Never true  . Transportation needs    Medical: No    Non-medical: No  Tobacco Use  . Smoking status: Current Some Day Smoker    Packs/day: 0.50    Years: 60.00    Pack years: 30.00    Types: Cigarettes  . Smokeless tobacco: Never Used  . Tobacco comment: [place] smoking cessation information provided  Substance and Sexual Activity  . Alcohol use: Yes    Alcohol/week: 1.0 standard drinks    Types: 1 Cans of beer per week  . Drug use: Yes  Comment: marijuana  . Sexual activity: Never  Lifestyle  . Physical activity    Days per week: 0 days    Minutes per session: 0 min  . Stress: Not at all  Relationships  . Social connections    Talks on phone: More than three times a week    Gets together: Three times a week    Attends religious service: Never    Active member of club or organization: No    Attends meetings of clubs or organizations: Never    Relationship status: Separated  Other Topics Concern  . Not on file  Social History Narrative  . Not on file     Outpatient Encounter Medications as of 06/20/2019  Medication Sig  . albuterol (PROVENTIL HFA;VENTOLIN HFA) 108 (90 Base) MCG/ACT inhaler Inhale 2 puffs into the lungs every 6 (six) hours as needed for wheezing or shortness of breath.  Marland Kitchen atorvastatin (LIPITOR) 80 MG tablet Take 1 tablet (80 mg total) by mouth daily.  . clopidogrel (PLAVIX) 75 MG tablet Take 1 tablet (75 mg total) by mouth daily.  Marland Kitchen ketoconazole (NIZORAL) 2 % cream APP EXT AA QD  . montelukast (SINGULAIR) 10 MG tablet Take 1 tablet (10 mg total) by mouth at bedtime.  . tamsulosin (FLOMAX) 0.4 MG CAPS capsule Take 1 capsule (0.4 mg total) by mouth daily.  Marland Kitchen umeclidinium-vilanterol (ANORO ELLIPTA) 62.5-25 MCG/INH AEPB Inhale into the lungs. Dr Raul Del   No facility-administered encounter medications on file as of 06/20/2019.     Activities of Daily Living In your present state of health, do you have any difficulty performing the following activities: 06/20/2019  Hearing? N  Comment declines hearing aids  Vision? N  Difficulty concentrating or making decisions? N  Walking or climbing stairs? N  Dressing or bathing? N  Doing errands, shopping? N  Preparing Food and eating ? N  Using the Toilet? N  In the past six months, have you accidently leaked urine? N  Do you have problems with loss of bowel control? N  Managing your Medications? N  Managing your Finances? N  Housekeeping or managing your Housekeeping? N  Some recent data might be hidden    Patient Care Team: Juline Patch, MD as PCP - General (Family Medicine) Laneta Simmers as Physician Assistant (Urology) Erby Pian, MD as Consulting Physician (Specialist)   Assessment:   This is a routine wellness examination for Nathaniel Garcia.  Exercise Activities and Dietary recommendations Current Exercise Habits: The patient does not participate in regular exercise at present, Exercise limited by: respiratory conditions(s)  Goals    . DIET - INCREASE WATER  INTAKE     Recommend to drink at least 6-8 8oz glasses of water per day.       Fall Risk Fall Risk  06/20/2019 10/25/2018 06/16/2018 02/12/2016 01/03/2016  Falls in the past year? 0 0 No No No  Number falls in past yr: 0 - - - -  Injury with Fall? 0 - - - -  Risk for fall due to : - - Impaired vision;Other (Comment) - -  Risk for fall due to: Comment - - wears eyeglasses; ilicit drug use - -  Follow up Falls prevention discussed - - - -   FALL RISK PREVENTION PERTAINING TO THE HOME:  Any stairs in or around the home? Yes  If so, do they handrails? Yes   Home free of loose throw rugs in walkways, pet beds, electrical cords, etc? Yes  Adequate lighting in your home to reduce risk of falls? Yes   ASSISTIVE DEVICES UTILIZED TO PREVENT FALLS:  Life alert? No  Use of a cane, walker or w/c? No  Grab bars in the bathroom? Yes  Shower chair or bench in shower? No  Elevated toilet seat or a handicapped toilet? No   DME ORDERS:  DME order needed?  No   TIMED UP AND GO:  Was the test performed? No . Telephonic visit  Education: Fall risk prevention has been discussed.  Intervention(s) required? No   Depression Screen PHQ 2/9 Scores 06/20/2019 03/18/2019 10/25/2018 06/16/2018  PHQ - 2 Score 0 0 0 0  PHQ- 9 Score - 0 0 0    Cognitive Function     6CIT Screen 06/20/2019 06/16/2018  What Year? 0 points 0 points  What month? 0 points 0 points  What time? 0 points 0 points  Count back from 20 0 points 0 points  Months in reverse 2 points 0 points  Repeat phrase 0 points 0 points  Total Score 2 0     There is no immunization history on file for this patient.  Qualifies for Shingles Vaccine? Yes  . Due for Shingrix. Education has been provided regarding the importance of this vaccine. Pt has been advised to call insurance company to determine out of pocket expense. Advised may also receive vaccine at local pharmacy or Health Dept. Verbalized acceptance and understanding.  Tdap:  Although this vaccine is not a covered service during a Wellness Exam, does the patient still wish to receive this vaccine today?  No .  Education has been provided regarding the importance of this vaccine. Advised may receive this vaccine at local pharmacy or Health Dept. Aware to provide a copy of the vaccination record if obtained from local pharmacy or Health Dept. Verbalized acceptance and understanding.  Flu Vaccine: Due for Flu vaccine. Does the patient want to receive this vaccine today?  No . Education has been provided regarding the importance of this vaccine but still declined. Advised may receive this vaccine at local pharmacy or Health Dept. Aware to provide a copy of the vaccination record if obtained from local pharmacy or Health Dept. Verbalized acceptance and understanding.  Pneumococcal Vaccine: Due for Pneumococcal vaccine. Does the patient want to receive this vaccine today?  No . Education has been provided regarding the importance of this vaccine but still declined. Advised may receive this vaccine at local pharmacy or Health Dept. Aware to provide a copy of the vaccination record if obtained from local pharmacy or Health Dept. Verbalized acceptance and understanding.   Screening Tests Health Maintenance  Topic Date Due  . Hepatitis C Screening  1945/12/06  . TETANUS/TDAP  06/21/1964  . PNA vac Low Risk Adult (1 of 2 - PCV13) 06/21/2010  . INFLUENZA VACCINE  06/18/2019  . COLONOSCOPY  11/17/2020   Cancer Screenings:  Colorectal Screening: Completed 2012. Repeat every 10 years;   Lung Cancer Screening: (Low Dose CT Chest recommended if Age 42-80 years, 30 pack-year currently smoking OR have quit w/in 15years.) does qualify.   Additional Screening:  Hepatitis C Screening: does not qualify  Vision Screening: Recommended annual ophthalmology exams for early detection of glaucoma and other disorders of the eye. Is the patient up to date with their annual eye exam?  Yes   Who is the provider or what is the name of the office in which the pt attends annual eye exams? [facility] [place]  Dental Screening:  Recommended annual dental exams for proper oral hygiene  Community Resource Referral:  CRR required this visit?  No       Plan:    I have personally reviewed and addressed the Medicare Annual Wellness questionnaire and have noted the following in the patient's chart:  A. Medical and social history B. Use of alcohol, tobacco or illicit drugs  C. Current medications and supplements D. Functional ability and status E.  Nutritional status F.  Physical activity G. Advance directives H. List of other physicians I.  Hospitalizations, surgeries, and ER visits in previous 12 months J.  Vitals K. Screenings such as hearing and vision if needed, cognitive and depression L. Referrals and appointments   In addition, I have reviewed and discussed with patient certain preventive protocols, quality metrics, and best practice recommendations. A written personalized care plan for preventive services as well as general preventive health recommendations were provided to patient.   Signed,  Reather LittlerKasey Wendy Hoback, LPN Nurse Health Advisor   Nurse Notes: pt doing well and appreciative of visit today.

## 2019-06-20 NOTE — Telephone Encounter (Signed)
App made and mailed to patient Called to schedule pt was on the other line  Nathaniel Garcia

## 2019-06-20 NOTE — Telephone Encounter (Signed)
-----   Message from Nori Riis, PA-C sent at 06/20/2019  1:04 PM EDT ----- I spoke with Nathaniel Garcia regarding the microscopic blood in his urine and he needs to undergo a cystoscopy at this time.  He would like the cystoscopy in [place] and he would like a valium called in to take prior to the procedure.  I did advise him he would need a driver.

## 2019-06-20 NOTE — Patient Instructions (Signed)
Nathaniel Garcia , Thank you for taking time to come for your Medicare Wellness Visit. I appreciate your ongoing commitment to your health goals. Please review the following plan we discussed and let me know if I can assist you in the future.   Screening recommendations/referrals: Colonoscopy: done 2012. Repeat in 2022. Recommended yearly ophthalmology/optometry visit for glaucoma screening and checkup Recommended yearly dental visit for hygiene and checkup  Vaccinations: Influenza vaccine: postponed Pneumococcal vaccine: postponed Tdap vaccine: postponed Shingles vaccine: Shingrix discussed. Please contact your pharmacy for coverage information.   Advanced directives: Advance directive discussed with you today. Even though you declined this today please call our office should you change your mind and we can give you the proper paperwork for you to fill out.  Conditions/risks identified: If you wish to quit smoking, help is available. For free tobacco cessation program offerings call the [facility] at [phone] or Live Well Line at (775) 213-7188. You may also visit [email or web address] or email [email or web address] for more information on other programs.   Next appointment: Please follow up in one year for your Medicare Annual Wellness visit.    Preventive Care 43 Years and Older, Male Preventive care refers to lifestyle choices and visits with your health care provider that can promote health and wellness. What does preventive care include?  A yearly physical exam. This is also called an annual well check.  Dental exams once or twice a year.  Routine eye exams. Ask your health care provider how often you should have your eyes checked.  Personal lifestyle choices, including:  Daily care of your teeth and gums.  Regular physical activity.  Eating a healthy diet.  Avoiding tobacco and drug use.  Limiting alcohol use.  Practicing safe sex.  Taking low  doses of aspirin every day.  Taking vitamin and mineral supplements as recommended by your health care provider. What happens during an annual well check? The services and screenings done by your health care provider during your annual well check will depend on your age, overall health, lifestyle risk factors, and family history of disease. Counseling  Your health care provider may ask you questions about your:  Alcohol use.  Tobacco use.  Drug use.  Emotional well-being.  Home and relationship well-being.  Sexual activity.  Eating habits.  History of falls.  Memory and ability to understand (cognition).  Work and work Statistician. Screening  You may have the following tests or measurements:  Height, weight, and BMI.  Blood pressure.  Lipid and cholesterol levels. These may be checked every 5 years, or more frequently if you are over 47 years old.  Skin check.  Lung cancer screening. You may have this screening every year starting at age 65 if you have a 30-pack-year history of smoking and currently smoke or have quit within the past 15 years.  Fecal occult blood test (FOBT) of the stool. You may have this test every year starting at age 36.  Flexible sigmoidoscopy or colonoscopy. You may have a sigmoidoscopy every 5 years or a colonoscopy every 10 years starting at age 73.  Prostate cancer screening. Recommendations will vary depending on your family history and other risks.  Hepatitis C blood test.  Hepatitis B blood test.  Sexually transmitted disease (STD) testing.  Diabetes screening. This is done by checking your blood sugar (glucose) after you have not eaten for a while (fasting). You may have this done every 1-3 years.  Abdominal aortic aneurysm (AAA) screening. You may  need this if you are a current or former smoker.  Osteoporosis. You may be screened starting at age 74 if you are at high risk. Talk with your health care provider about your test  results, treatment options, and if necessary, the need for more tests. Vaccines  Your health care provider may recommend certain vaccines, such as:  Influenza vaccine. This is recommended every year.  Tetanus, diphtheria, and acellular pertussis (Tdap, Td) vaccine. You may need a Td booster every 10 years.  Zoster vaccine. You may need this after age 74.  Pneumococcal 13-valent conjugate (PCV13) vaccine. One dose is recommended after age 74.  Pneumococcal polysaccharide (PPSV23) vaccine. One dose is recommended after age 74. Talk to your health care provider about which screenings and vaccines you need and how often you need them. This information is not intended to replace advice given to you by your health care provider. Make sure you discuss any questions you have with your health care provider. Document Released: 11/30/2015 Document Revised: 07/23/2016 Document Reviewed: 09/04/2015 Elsevier Interactive Patient Education  2017 [employer]  Fall Prevention in the Home Falls can cause injuries. They can happen to people of all ages. There are many things you can do to make your home safe and to help prevent falls. What can I do on the outside of my home?  Regularly fix the edges of walkways and driveways and fix any cracks.  Remove anything that might make you trip as you walk through a door, such as a raised step or threshold.  Trim any bushes or trees on the path to your home.  Use bright outdoor lighting.  Clear any walking paths of anything that might make someone trip, such as rocks or tools.  Regularly check to see if handrails are loose or broken. Make sure that both sides of any steps have handrails.  Any raised decks and porches should have guardrails on the edges.  Have any leaves, snow, or ice cleared regularly.  Use sand or salt on walking paths during winter.  Clean up any spills in your garage right away. This includes oil or grease spills. What can I do in  the bathroom?  Use night lights.  Install grab bars by the toilet and in the tub and shower. Do not use towel bars as grab bars.  Use non-skid mats or decals in the tub or shower.  If you need to sit down in the shower, use a plastic, non-slip stool.  Keep the floor dry. Clean up any water that spills on the floor as soon as it happens.  Remove soap buildup in the tub or shower regularly.  Attach bath mats securely with double-sided non-slip rug tape.  Do not have throw rugs and other things on the floor that can make you trip. What can I do in the bedroom?  Use night lights.  Make sure that you have a light by your bed that is easy to reach.  Do not use any sheets or blankets that are too big for your bed. They should not hang down onto the floor.  Have a firm chair that has side arms. You can use this for support while you get dressed.  Do not have throw rugs and other things on the floor that can make you trip. What can I do in the kitchen?  Clean up any spills right away.  Avoid walking on wet floors.  Keep items that you use a lot in easy-to-reach places.  If  you need to reach something above you, use a strong step stool that has a grab bar.  Keep electrical cords out of the way.  Do not use floor polish or wax that makes floors slippery. If you must use wax, use non-skid floor wax.  Do not have throw rugs and other things on the floor that can make you trip. What can I do with my stairs?  Do not leave any items on the stairs.  Make sure that there are handrails on both sides of the stairs and use them. Fix handrails that are broken or loose. Make sure that handrails are as long as the stairways.  Check any carpeting to make sure that it is firmly attached to the stairs. Fix any carpet that is loose or worn.  Avoid having throw rugs at the top or bottom of the stairs. If you do have throw rugs, attach them to the floor with carpet tape.  Make sure that you  have a light switch at the top of the stairs and the bottom of the stairs. If you do not have them, ask someone to add them for you. What else can I do to help prevent falls?  Wear shoes that:  Do not have high heels.  Have rubber bottoms.  Are comfortable and fit you well.  Are closed at the toe. Do not wear sandals.  If you use a stepladder:  Make sure that it is fully opened. Do not climb a closed stepladder.  Make sure that both sides of the stepladder are locked into place.  Ask someone to hold it for you, if possible.  Clearly mark and make sure that you can see:  Any grab bars or handrails.  First and last steps.  Where the edge of each step is.  Use tools that help you move around (mobility aids) if they are needed. These include:  Canes.  Walkers.  Scooters.  Crutches.  Turn on the lights when you go into a dark area. Replace any light bulbs as soon as they burn out.  Set up your furniture so you have a clear path. Avoid moving your furniture around.  If any of your floors are uneven, fix them.  If there are any pets around you, be aware of where they are.  Review your medicines with your doctor. Some medicines can make you feel dizzy. This can increase your chance of falling. Ask your doctor what other things that you can do to help prevent falls. This information is not intended to replace advice given to you by your health care provider. Make sure you discuss any questions you have with your health care provider. Document Released: 08/30/2009 Document Revised: 04/10/2016 Document Reviewed: 12/08/2014 Elsevier Interactive Patient Education  2017 [employer]

## 2019-06-30 ENCOUNTER — Ambulatory Visit: Payer: Medicare Other | Admitting: Family Medicine

## 2019-06-30 ENCOUNTER — Ambulatory Visit: Payer: Medicare HMO | Admitting: Family Medicine

## 2019-07-11 ENCOUNTER — Other Ambulatory Visit: Payer: Self-pay | Admitting: Urology

## 2019-07-11 MED ORDER — DIAZEPAM 5 MG PO TABS: ORAL_TABLET | ORAL | 0 refills | Status: DC

## 2019-07-11 NOTE — Telephone Encounter (Signed)
Done.  He needs to take it 30 minutes prior to his appointment.  He needs to have a driver.

## 2019-07-11 NOTE — Telephone Encounter (Signed)
Patient wants to know when you are going to send in the valium for his cysto It is scheduled for 07-28-19   Thanks, Sharyn Lull

## 2019-07-12 ENCOUNTER — Other Ambulatory Visit: Payer: Medicare HMO | Admitting: Urology

## 2019-07-15 NOTE — Telephone Encounter (Signed)
Spoke with the patient and he is aware   Sharyn Lull

## 2019-07-28 ENCOUNTER — Other Ambulatory Visit: Payer: Self-pay

## 2019-07-28 ENCOUNTER — Ambulatory Visit (INDEPENDENT_AMBULATORY_CARE_PROVIDER_SITE_OTHER): Payer: Medicare HMO | Admitting: Urology

## 2019-07-28 ENCOUNTER — Encounter: Payer: Self-pay | Admitting: Urology

## 2019-07-28 VITALS — BP 156/76 | HR 66 | Ht 72.0 in | Wt 120.0 lb

## 2019-07-28 DIAGNOSIS — R3121 Asymptomatic microscopic hematuria: Secondary | ICD-10-CM | POA: Diagnosis not present

## 2019-07-28 NOTE — Progress Notes (Signed)
Cystoscopy Procedure Note:  Indication: Microscopic hematuria, history of negative CTU and cysto in 2017  After informed consent and discussion of the procedure and its risks, Nathaniel Garcia was positioned and prepped in the standard fashion. Cystoscopy was performed with a flexible cystoscope. The urethra, bladder neck and entire bladder was visualized in a standard fashion. The prostate was moderate in size. The ureteral orifices were visualized in their normal location and orientation. No abnormalities on retroflexion.  Imaging: Renal ultrasound 07/2018 with no hydronephrosis or masses CT A/P 07/2018 no hydronephrosis, stone, or masses  Findings: Normal cystoscopy  Assessment and Plan: Does not need repeat hematuria workup Recommend discontinuing PSA screen per AUA guidelines RTC one year with [place] for BPH on flomax  Nickolas Madrid, MD 07/28/2019

## 2019-07-28 NOTE — Addendum Note (Signed)
Addended by: Tommy Rainwater on: 07/28/2019 03:57 PM   Modules accepted: Orders

## 2019-07-29 ENCOUNTER — Telehealth: Payer: Self-pay | Admitting: Urology

## 2019-07-29 LAB — MICROSCOPIC EXAMINATION: Bacteria, UA: NONE SEEN

## 2019-07-29 LAB — URINALYSIS, COMPLETE
Bilirubin, UA: NEGATIVE
Glucose, UA: NEGATIVE
Leukocytes,UA: NEGATIVE
Nitrite, UA: NEGATIVE
Protein,UA: NEGATIVE
Specific Gravity, UA: 1.02 (ref 1.005–1.030)
Urobilinogen, Ur: 1 mg/dL (ref 0.2–1.0)
pH, UA: 7 (ref 5.0–7.5)

## 2019-07-29 NOTE — Telephone Encounter (Signed)
Pt had a cysto yesterday w/[place] and is having pain with urination.  He said when the thought hits him, he has to go to bathroom and if he doesn't go within 2 minutes, he is going to urinate on himself.

## 2019-07-29 NOTE — Telephone Encounter (Signed)
Spoke with patient informed him it was normal to have burning after a cystoscopy informed to push fluids, hydrate with water-per Dr. Diamantina Providence take AZO for three days to see if symptoms improve. Patient verbalized understanding.

## 2019-08-29 ENCOUNTER — Ambulatory Visit (INDEPENDENT_AMBULATORY_CARE_PROVIDER_SITE_OTHER): Payer: Medicare HMO | Admitting: Family Medicine

## 2019-08-29 ENCOUNTER — Encounter: Payer: Self-pay | Admitting: Family Medicine

## 2019-08-29 ENCOUNTER — Other Ambulatory Visit: Payer: Self-pay

## 2019-08-29 VITALS — BP 128/78 | HR 64 | Resp 16 | Ht 72.0 in | Wt 110.0 lb

## 2019-08-29 DIAGNOSIS — M5412 Radiculopathy, cervical region: Secondary | ICD-10-CM

## 2019-08-29 DIAGNOSIS — L821 Other seborrheic keratosis: Secondary | ICD-10-CM | POA: Diagnosis not present

## 2019-08-29 MED ORDER — MELOXICAM 7.5 MG PO TABS: 7.5000 mg | ORAL_TABLET | Freq: Every day | ORAL | 0 refills | Status: DC

## 2019-08-29 NOTE — Patient Instructions (Signed)
Radicular Pain Radicular pain is a type of pain that spreads from your back or neck along a spinal nerve. Spinal nerves are nerves that leave the spinal cord and go to the muscles. Radicular pain is sometimes called radiculopathy, radiculitis, or a pinched nerve. When you have this type of pain, you may also have weakness, numbness, or tingling in the area of your body that is supplied by the nerve. The pain may feel sharp and burning. Depending on which spinal nerve is affected, the pain may occur in the:  Neck area (cervical radicular pain). You may also feel pain, numbness, weakness, or tingling in the arms.  Mid-spine area (thoracic radicular pain). You would feel this pain in the back and chest. This type is rare.  Lower back area (lumbar radicular pain). You would feel this pain as low back pain. You may feel pain, numbness, weakness, or tingling in the buttocks or legs. Sciatica is a type of lumbar radicular pain that shoots down the back of the leg. Radicular pain occurs when one of the spinal nerves becomes irritated or squeezed (compressed). It is often caused by something pushing on a spinal nerve, such as one of the bones of the spine (vertebrae) or one of the round cushions between vertebrae (intervertebral disks). This can result from:  An injury.  Wear and tear or aging of a disk.  The growth of a bone spur that pushes on the nerve. Radicular pain often goes away when you follow instructions from your health care provider for relieving pain at home. Follow these instructions at home: Managing pain      If directed, put ice on the affected area: ? Put ice in a plastic bag. ? Place a towel between your skin and the bag. ? Leave the ice on for 20 minutes, 2-3 times a day.  If directed, apply heat to the affected area as often as told by your health care provider. Use the heat source that your health care provider recommends, such as a moist heat pack or a heating pad. ? Place  a towel between your skin and the heat source. ? Leave the heat on for 20-30 minutes. ? Remove the heat if your skin turns bright red. This is especially important if you are unable to feel pain, heat, or cold. You may have a greater risk of getting burned. Activity   Do not sit or rest in bed for long periods of time.  Try to stay as active as possible. Ask your health care provider what type of exercise or activity is best for you.  Avoid activities that make your pain worse, such as bending and lifting.  Do not lift anything that is heavier than 10 lb (4.5 kg), or the limit that you are told, until your health care provider says that it is safe.  Practice using proper technique when lifting items. Proper lifting technique involves bending your knees and rising up.  Do strength and range-of-motion exercises only as told by your health care provider or physical therapist. General instructions  Take over-the-counter and prescription medicines only as told by your health care provider.  Pay attention to any changes in your symptoms.  Keep all follow-up visits as told by your health care provider. This is important. ? Your health care provider may send you to a physical therapist to help with this pain. Contact a health care provider if:  Your pain and other symptoms get worse.  Your pain medicine is not   helping.  Your pain has not improved after a few weeks of home care.  You have a fever. Get help right away if:  You have severe pain, weakness, or numbness.  You have difficulty with bladder or bowel control. Summary  Radicular pain is a type of pain that spreads from your back or neck along a spinal nerve.  When you have radicular pain, you may also have weakness, numbness, or tingling in the area of your body that is supplied by the nerve.  The pain may feel sharp or burning.  Radicular pain may be treated with ice, heat, medicines, or physical therapy. This  information is not intended to replace advice given to you by your health care provider. Make sure you discuss any questions you have with your health care provider. Document Released: 12/11/2004 Document Revised: 05/18/2018 Document Reviewed: 05/18/2018 Elsevier Patient Education  2020 [employer]  

## 2019-08-29 NOTE — Progress Notes (Signed)
Date:  08/29/2019   Name:  Nathaniel Garcia   DOB:  [birth year]   MRN:  [id number]   Chief Complaint: Numbness (Left arm numbness no injury. x 3 days feels like it is asleep. )  Arm Pain  The incident occurred more than 1 week ago. There was no injury mechanism. The pain is present in the left shoulder (upper arm). The quality of the pain is described as aching. The pain is moderate. Associated symptoms include numbness. Pertinent negatives include no chest pain, muscle weakness or tingling. Nothing aggravates the symptoms. He has tried NSAIDs for the symptoms. The treatment provided mild relief.    Review of Systems  Constitutional: Negative for chills and fever.  HENT: Negative for drooling, ear discharge, ear pain, nosebleeds, postnasal drip and sore throat.   Respiratory: Negative for cough, shortness of breath and wheezing.   Cardiovascular: Negative for chest pain, palpitations and leg swelling.  Gastrointestinal: Negative for abdominal pain, blood in stool, constipation, diarrhea and nausea.  Endocrine: Negative for polydipsia.  Genitourinary: Negative for dysuria, frequency, hematuria and urgency.  Musculoskeletal: Negative for back pain, myalgias and neck pain.  Skin: Negative for rash.  Allergic/Immunologic: Negative for environmental allergies.  Neurological: Positive for numbness. Negative for dizziness, tingling and headaches.  Hematological: Does not bruise/bleed easily.  Psychiatric/Behavioral: Negative for suicidal ideas. The patient is not nervous/anxious.     Patient Active Problem List   Diagnosis Date Noted  . Aortic atherosclerosis (HCC) 12/30/2018  . Benign hematuria 09/18/2016  . Weakness of left hand 07/02/2016  . Lipid screening 01/03/2016  . Cerebral vascular disease 09/14/2015  . Carotid artery narrowing 08/15/2015  . Bilateral carotid artery stenosis 08/15/2015  . Bradycardia 08/01/2015  . H/O transient cerebral ischemia 08/01/2015  .  Combined fat and carbohydrate induced hyperlipemia 08/01/2015  . Breathlessness on exertion 07/30/2015  . Alcoholic (HCC) 04/03/2015  . Absolute anemia 04/03/2015  . 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  . Cerebral vascular accident (HCC) 04/03/2015  . HLD (hyperlipidemia) 04/03/2015  . Blood glucose elevated 04/03/2015  . Lung nodule, solitary 04/03/2015  . Non compliance with medical treatment 04/03/2015  . Lung nodule, multiple 04/03/2015  . Cerebral infarction (HCC) 04/03/2015  . Abnormal lung field 04/03/2015  . Alcohol dependence (HCC) 04/03/2015    No Known Allergies  Past Surgical History:  Procedure Laterality Date  . COLONOSCOPY  2011   normal- [facility] docs    Social History   Tobacco Use  . Smoking status: Current Some Day Smoker    Packs/day: 0.02    Years: 60.00    Pack years: 1.20    Types: Cigarettes  . Smokeless tobacco: Never Used  . Tobacco comment: [place] smoking cessation information provided  Substance Use Topics  . Alcohol use: Yes    Alcohol/week: 1.0 standard drinks    Types: 1 Cans of beer per week  . Drug use: Yes    Comment: marijuana     Medication list has been reviewed and updated.  Current Meds  Medication Sig  . albuterol (PROVENTIL HFA;VENTOLIN HFA) 108 (90 Base) MCG/ACT inhaler Inhale 2 puffs into the lungs every 6 (six) hours as needed for wheezing or shortness of breath.  Marland Kitchen atorvastatin (LIPITOR) 80 MG tablet Take 1 tablet (80 mg total) by mouth daily.  . clopidogrel (PLAVIX) 75 MG tablet Take 1 tablet (75 mg total) by mouth daily.  Marland Kitchen ketoconazole (NIZORAL) 2 % cream APP EXT AA QD  . montelukast (SINGULAIR) 10 MG  tablet Take 1 tablet (10 mg total) by mouth at bedtime.  . tamsulosin (FLOMAX) 0.4 MG CAPS capsule Take 1 capsule (0.4 mg total) by mouth daily.  Marland Kitchen umeclidinium-vilanterol (ANORO ELLIPTA) 62.5-25 MCG/INH AEPB Inhale into the lungs. Dr Raul Del  . [DISCONTINUED] diazepam (VALIUM) 5 MG tablet Take tablet 30 minutes  prior to cystoscopy.    PHQ 2/9 Scores 08/29/2019 06/20/2019 03/18/2019 10/25/2018  PHQ - 2 Score 0 0 0 0  PHQ- 9 Score - - 0 0    BP Readings from Last 3 Encounters:  08/29/19 128/78  07/28/19 (!) 156/76  06/13/19 (!) 107/57    Physical Exam Vitals signs and nursing note reviewed.  HENT:     Head: Normocephalic.     Right Ear: Tympanic membrane, ear canal and external ear normal.     Left Ear: Tympanic membrane, ear canal and external ear normal.     Nose: Nose normal. No congestion or rhinorrhea.  Eyes:     General: No scleral icterus.       Right eye: No discharge.        Left eye: No discharge.     Conjunctiva/sclera: Conjunctivae normal.     Pupils: Pupils are equal, round, and reactive to light.  Neck:     Musculoskeletal: Normal range of motion and neck supple.     Thyroid: No thyromegaly.     Vascular: No JVD.     Trachea: No tracheal deviation.  Cardiovascular:     Rate and Rhythm: Normal rate and regular rhythm.     Chest Wall: PMI is not displaced.     Pulses: Normal pulses.     Heart sounds: Normal heart sounds, S1 normal and S2 normal. No murmur. No systolic murmur. No diastolic murmur. No friction rub. No gallop. No S3 or S4 sounds.   Pulmonary:     Effort: No respiratory distress.     Breath sounds: Normal breath sounds. No wheezing, rhonchi or rales.  Abdominal:     General: Bowel sounds are normal.     Palpations: Abdomen is soft. There is no mass.     Tenderness: There is no abdominal tenderness. There is no guarding or rebound.  Musculoskeletal: Normal range of motion.        General: No tenderness.     Left shoulder: He exhibits normal range of motion, no tenderness, no bony tenderness and no swelling.     Right lower leg: No edema.     Left lower leg: No edema.  Lymphadenopathy:     Cervical: No cervical adenopathy.  Skin:    General: Skin is warm.     Findings: No rash.  Neurological:     General: No focal deficit present.     Mental Status: He  is alert and oriented to person, place, and time.     Cranial Nerves: No cranial nerve deficit.     Sensory: Sensation is intact.     Motor: Motor function is intact.     Deep Tendon Reflexes: Reflexes are normal and symmetric.     Wt Readings from Last 3 Encounters:  08/29/19 110 lb (49.9 kg)  07/28/19 120 lb (54.4 kg)  06/20/19 116 lb (52.6 kg)    BP 128/78   Pulse 64   Resp 16   Ht 6' (1.829 m)   Wt 110 lb (49.9 kg)   SpO2 98%   BMI 14.92 kg/m   Assessment and Plan:  1. Cervical radiculopathy Patient has pain that  only involves the left shoulder and upper arm with some mild numbness in no particular dermatome distribution.  Neurologic exam was unremarkable for residual sensory deficit, motor deficit, loss of reflex.  I feel this is most likely a nerve root irritation causing a radiculopathy of the upper left arm.  Will initiate meloxicam 7.5 mg once a day to take for 2 weeks and may have the remainder to use for later use. - meloxicam (MOBIC) 7.5 MG tablet; Take 1 tablet (7.5 mg total) by mouth daily.  Dispense: 30 tablet; Refill: 0  2. Seborrheic keratosis Patient had a seborrheic keratoses on the back of the left thigh which he wanted me to evaluate this is unremarkable and stable and resembles all the other seborrheic keratoses that he has generalized.  Patient was reassured but may return if this should change in any size or coloration.

## 2019-09-08 ENCOUNTER — Other Ambulatory Visit: Payer: Self-pay

## 2019-09-08 DIAGNOSIS — J209 Acute bronchitis, unspecified: Secondary | ICD-10-CM

## 2019-09-08 DIAGNOSIS — J301 Allergic rhinitis due to pollen: Secondary | ICD-10-CM

## 2019-09-08 DIAGNOSIS — J440 Chronic obstructive pulmonary disease with acute lower respiratory infection: Secondary | ICD-10-CM

## 2019-09-08 MED ORDER — MONTELUKAST SODIUM 10 MG PO TABS: 10.0000 mg | ORAL_TABLET | Freq: Every day | ORAL | 3 refills | Status: DC

## 2019-09-12 ENCOUNTER — Ambulatory Visit (INDEPENDENT_AMBULATORY_CARE_PROVIDER_SITE_OTHER): Payer: Medicare HMO | Admitting: Family Medicine

## 2019-09-12 ENCOUNTER — Other Ambulatory Visit: Payer: Self-pay

## 2019-09-12 ENCOUNTER — Encounter: Payer: Self-pay | Admitting: Family Medicine

## 2019-09-12 VITALS — BP 120/80 | HR 78 | Ht 72.0 in | Wt 125.0 lb

## 2019-09-12 DIAGNOSIS — M5412 Radiculopathy, cervical region: Secondary | ICD-10-CM | POA: Diagnosis not present

## 2019-09-12 MED ORDER — MELOXICAM 7.5 MG PO TABS: 7.5000 mg | ORAL_TABLET | Freq: Every day | ORAL | 5 refills | Status: DC

## 2019-09-12 NOTE — Progress Notes (Signed)
Date:  09/12/2019   Name:  Nathaniel Garcia   DOB:  10-16-1946   MRN:  [id number]   Chief Complaint: Follow-up (cervical radiculopathy- does better on meloxicam 15mg - can he have that)  Neck Pain  This is a recurrent problem. The current episode started 1 to 4 weeks ago. The problem occurs intermittently. The problem has been gradually improving. The pain is associated with nothing. The pain is present in the left side and right side. The quality of the pain is described as aching. The pain is moderate. Nothing aggravates the symptoms. Stiffness is present in the morning. Pertinent negatives include no chest pain, fever, headaches, leg pain, numbness, pain with swallowing, paresis, syncope, tingling, trouble swallowing, visual change or weakness. The treatment provided moderate relief.    Review of Systems  Constitutional: Negative for chills and fever.  HENT: Negative for drooling, ear discharge, ear pain, sore throat and trouble swallowing.   Respiratory: Negative for cough, shortness of breath and wheezing.   Cardiovascular: Negative for chest pain, palpitations, leg swelling and syncope.  Gastrointestinal: Negative for abdominal pain, blood in stool, constipation, diarrhea and nausea.  Endocrine: Negative for polydipsia.  Genitourinary: Negative for dysuria, frequency, hematuria and urgency.  Musculoskeletal: Positive for neck pain. Negative for back pain and myalgias.  Skin: Negative for rash.  Allergic/Immunologic: Negative for environmental allergies.  Neurological: Negative for dizziness, tingling, weakness, numbness and headaches.  Hematological: Does not bruise/bleed easily.  Psychiatric/Behavioral: Negative for suicidal ideas. The patient is not nervous/anxious.     Patient Active Problem List   Diagnosis Date Noted  . Aortic atherosclerosis ([place]) 12/30/2018  . Benign hematuria 09/18/2016  . Weakness of left hand 07/02/2016  . Lipid screening 01/03/2016  .  Cerebral vascular disease 09/14/2015  . Carotid artery narrowing 08/15/2015  . Bilateral carotid artery stenosis 08/15/2015  . Bradycardia 08/01/2015  . H/O transient cerebral ischemia 08/01/2015  . Combined fat and carbohydrate induced hyperlipemia 08/01/2015  . Breathlessness on exertion 07/30/2015  . Alcoholic ([place]) 05/39/7673  . Absolute anemia 04/03/2015  . 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[place]) 04/03/2015  . Cerebral vascular accident ([place]) 04/03/2015  . HLD (hyperlipidemia) 04/03/2015  . Blood glucose elevated 04/03/2015  . Lung nodule, solitary 04/03/2015  . Non compliance with medical treatment 04/03/2015  . Lung nodule, multiple 04/03/2015  . Cerebral infarction ([place]) 04/03/2015  . Abnormal lung field 04/03/2015  . Alcohol dependence ([place]) 04/03/2015    No Known Allergies  Past Surgical History:  Procedure Laterality Date  . COLONOSCOPY  2011   normal- [facility] docs    Social History   Tobacco Use  . Smoking status: Current Some Day Smoker    Packs/day: 0.02    Years: 60.00    Pack years: 1.20    Types: Cigarettes  . Smokeless tobacco: Never Used  . Tobacco comment: [place] smoking cessation information provided  Substance Use Topics  . Alcohol use: Yes    Alcohol/week: 1.0 standard drinks    Types: 1 Cans of beer per week  . Drug use: Yes    Comment: marijuana     Medication list has been reviewed and updated.  Current Meds  Medication Sig  . albuterol (PROVENTIL HFA;VENTOLIN HFA) 108 (90 Base) MCG/ACT inhaler Inhale 2 puffs into the lungs every 6 (six) hours as needed for wheezing or shortness of breath.  Marland Kitchen atorvastatin (LIPITOR) 80 MG tablet Take 1 tablet (80 mg total) by mouth daily.  . clopidogrel (PLAVIX) 75 MG tablet Take 1  tablet (75 mg total) by mouth daily.  Marland Kitchen ketoconazole (NIZORAL) 2 % cream APP EXT AA QD  . meloxicam (MOBIC) 7.5 MG tablet Take 1 tablet (7.5 mg total) by mouth daily.  . montelukast (SINGULAIR) 10 MG tablet Take 1 tablet (10 mg total)  by mouth at bedtime.  . tamsulosin (FLOMAX) 0.4 MG CAPS capsule Take 1 capsule (0.4 mg total) by mouth daily.  Marland Kitchen umeclidinium-vilanterol (ANORO ELLIPTA) 62.5-25 MCG/INH AEPB Inhale into the lungs. Dr Meredeth Ide    [facility] 2/9 Scores 08/29/2019 06/20/2019 03/18/2019 10/25/2018  PHQ - 2 Score 0 0 0 0  PHQ- 9 Score - - 0 0    BP Readings from Last 3 Encounters:  09/12/19 120/80  08/29/19 128/78  07/28/19 (!) 156/76    Physical Exam Vitals signs and nursing note reviewed.  HENT:     Head: Normocephalic.     Right Ear: Tympanic membrane, ear canal and external ear normal.     Left Ear: Tympanic membrane, ear canal and external ear normal.     Nose: Nose normal.  Eyes:     General: No scleral icterus.       Right eye: No discharge.        Left eye: No discharge.     Conjunctiva/sclera: Conjunctivae normal.     Pupils: Pupils are equal, round, and reactive to light.  Neck:     Musculoskeletal: Normal range of motion and neck supple.     Thyroid: No thyromegaly.     Vascular: No JVD.     Trachea: No tracheal deviation.  Cardiovascular:     Rate and Rhythm: Normal rate and regular rhythm.     Heart sounds: Normal heart sounds. No murmur. No friction rub. No gallop.   Pulmonary:     Effort: No respiratory distress.     Breath sounds: Normal breath sounds. No wheezing or rales.  Abdominal:     General: Bowel sounds are normal.     Palpations: Abdomen is soft. There is no mass.     Tenderness: There is no abdominal tenderness. There is no guarding or rebound.  Musculoskeletal: Normal range of motion.        General: No tenderness.     Cervical back: He exhibits spasm. He exhibits no tenderness.  Lymphadenopathy:     Cervical: No cervical adenopathy.  Skin:    General: Skin is warm.     Findings: No rash.  Neurological:     Mental Status: He is alert and oriented to person, place, and time.     Cranial Nerves: No cranial nerve deficit.     Deep Tendon Reflexes: Reflexes are normal and  symmetric.     Wt Readings from Last 3 Encounters:  09/12/19 125 lb (56.7 kg)  08/29/19 110 lb (49.9 kg)  07/28/19 120 lb (54.4 kg)    BP 120/80   Pulse 78   Ht 6' (1.829 m)   Wt 125 lb (56.7 kg)   BMI 16.95 kg/m   Assessment and Plan:  1. Cervical radiculopathy Patient has improvement on the meloxicam 7.5 but his most patients might do he took 2 and it seems to work better however I pointed out to Nathaniel Garcia that the concern that I have is using NSAIDs for extended period of time at this higher dosing on his GFR.  Patient understands and notes that it is adequate control of his pain on the 7.5 mg therefore we will continue meloxicam 7.5 mg once a day. -  meloxicam (MOBIC) 7.5 MG tablet; Take 1 tablet (7.5 mg total) by mouth daily.  Dispense: 30 tablet; Refill: 5

## 2019-09-13 ENCOUNTER — Other Ambulatory Visit: Payer: Self-pay

## 2019-12-15 ENCOUNTER — Telehealth: Payer: Self-pay

## 2019-12-15 NOTE — Telephone Encounter (Signed)
Pt called asking if we are giving the covid shot and does he need to get it. As well as asking if there is a nasal spray instead of the covid vax. I informed him he does need to take it and there is not a nasal spray that we know of.

## 2019-12-19 ENCOUNTER — Ambulatory Visit: Payer: Medicare HMO | Admitting: Urology

## 2019-12-19 DIAGNOSIS — R0602 Shortness of breath: Secondary | ICD-10-CM | POA: Diagnosis not present

## 2019-12-19 DIAGNOSIS — R918 Other nonspecific abnormal finding of lung field: Secondary | ICD-10-CM | POA: Diagnosis not present

## 2019-12-19 DIAGNOSIS — Z720 Tobacco use: Secondary | ICD-10-CM | POA: Diagnosis not present

## 2019-12-19 DIAGNOSIS — J432 Centrilobular emphysema: Secondary | ICD-10-CM | POA: Diagnosis not present

## 2019-12-19 DIAGNOSIS — R69 Illness, unspecified: Secondary | ICD-10-CM | POA: Diagnosis not present

## 2020-02-08 ENCOUNTER — Ambulatory Visit: Payer: Medicare HMO | Admitting: Urology

## 2020-02-09 ENCOUNTER — Other Ambulatory Visit: Payer: Self-pay

## 2020-02-09 DIAGNOSIS — R3129 Other microscopic hematuria: Secondary | ICD-10-CM

## 2020-02-13 ENCOUNTER — Ambulatory Visit (INDEPENDENT_AMBULATORY_CARE_PROVIDER_SITE_OTHER): Payer: Medicare HMO | Admitting: Urology

## 2020-02-13 ENCOUNTER — Ambulatory Visit (INDEPENDENT_AMBULATORY_CARE_PROVIDER_SITE_OTHER): Payer: Medicare HMO | Admitting: Family Medicine

## 2020-02-13 ENCOUNTER — Encounter: Payer: Self-pay | Admitting: Urology

## 2020-02-13 ENCOUNTER — Other Ambulatory Visit
Admission: RE | Admit: 2020-02-13 | Discharge: 2020-02-13 | Disposition: A | Discharge status: Home / Self Care | Payer: Medicare HMO | Attending: Urology | Admitting: Urology

## 2020-02-13 ENCOUNTER — Encounter: Payer: Self-pay | Admitting: Family Medicine

## 2020-02-13 ENCOUNTER — Other Ambulatory Visit: Payer: Self-pay

## 2020-02-13 VITALS — BP 120/70 | HR 72 | Ht 72.0 in | Wt 123.0 lb

## 2020-02-13 VITALS — BP 160/79 | HR 59 | Ht 72.0 in | Wt 122.0 lb

## 2020-02-13 DIAGNOSIS — B351 Tinea unguium: Secondary | ICD-10-CM | POA: Diagnosis not present

## 2020-02-13 DIAGNOSIS — S91209S Unspecified open wound of unspecified toe(s) with damage to nail, sequela: Secondary | ICD-10-CM | POA: Diagnosis not present

## 2020-02-13 DIAGNOSIS — R3129 Other microscopic hematuria: Secondary | ICD-10-CM | POA: Diagnosis not present

## 2020-02-13 DIAGNOSIS — N401 Enlarged prostate with lower urinary tract symptoms: Secondary | ICD-10-CM

## 2020-02-13 DIAGNOSIS — N138 Other obstructive and reflux uropathy: Secondary | ICD-10-CM | POA: Diagnosis not present

## 2020-02-13 DIAGNOSIS — N400 Enlarged prostate without lower urinary tract symptoms: Secondary | ICD-10-CM | POA: Diagnosis not present

## 2020-02-13 DIAGNOSIS — L600 Ingrowing nail: Secondary | ICD-10-CM | POA: Diagnosis not present

## 2020-02-13 LAB — URINALYSIS, COMPLETE (UACMP) WITH MICROSCOPIC
Bilirubin Urine: NEGATIVE
Glucose, UA: NEGATIVE mg/dL
Ketones, ur: NEGATIVE mg/dL
Leukocytes,Ua: NEGATIVE
Nitrite: NEGATIVE
Protein, ur: NEGATIVE mg/dL
Specific Gravity, Urine: 1.025 (ref 1.005–1.030)
pH: 5.5 (ref 5.0–8.0)

## 2020-02-13 LAB — BLADDER SCAN AMB NON-IMAGING

## 2020-02-13 MED ORDER — TAMSULOSIN HCL 0.4 MG PO CAPS: 0.4000 mg | ORAL_CAPSULE | Freq: Every day | ORAL | 3 refills | Status: DC

## 2020-02-13 NOTE — Progress Notes (Signed)
02/13/2020 11:39 AM   [name] [birth year] [id number]  Referring provider: Duanne Limerick, MD [address] [place],  [place] [address]  Chief Complaint  Patient presents with  . Benign Prostatic Hypertrophy    follow up    HPI: 74 yo male with a history of hematuria, increase in PSA velocity and irregular prostate gland who presents for follow up.  History of hematuria (high risk) Smoker.  CTU 09/2016 revealed adrenal glands are unremarkable. No urinary stones. Ureters are decompressed. No filling defects in the intrarenal collecting systems, ureters or bladder.Prostate is enlarged and slightly indents the bladder base. He underwent cystoscopy in January 2018 and no worrisome findings were discovered.  RUS on 08/12/2018 revealed right kidney is rather small with slight increase in renal echogenicity. Question renal artery stenosis as a potential etiology for these findings. In this regard, question whether patient is hypertensive. Note that similar changes of this nature are not noted on the left.  No obstructing focus in either kidney.  No renal mass evident.  Mildly prominent prostate with mild inhomogeneous echotexture within the prostate.  Cystoscopy with Dr. Richardo Hanks 07/2019 was NED.    BPH WITH LUTS  (prostate and/or bladder) IPSS score: 8/1   PVR: 69 mL   Previous score: 2/0   Previous PVR: 0 mL  Major complaint(s): Frequency in the am.  He states he gets up at 5 AM due to the urge to urinate and voids, he then lays back down and has to get back up a couple more times to relieve his bladder.  He states he has been drinking beer with dinner and he has also been without his tamsulosin for the last month.  Patient denies any modifying or aggravating factors.  Patient denies any gross hematuria, dysuria or suprapubic/flank pain.  Patient denies any fevers, chills, nausea or vomiting.   Currently taking: tamsulosin 0.4 mg daily.   IPSS    Row Name 02/13/20  1100         International Prostate Symptom Score   How often have you had the sensation of not emptying your bladder?  Less than 1 in 5     How often have you had to urinate less than every two hours?  Not at All     How often have you found you stopped and started again several times when you urinated?  Not at All     How often have you found it difficult to postpone urination?  About half the time     How often have you had a weak urinary stream?  Less than half the time     How often have you had to strain to start urination?  Not at All     How many times did you typically get up at night to urinate?  2 Times     Total IPSS Score  8       Quality of Life due to urinary symptoms   If you were to spend the rest of your life with your urinary condition just the way it is now how would you feel about that?  Pleased        Score:  1-7 Mild 8-19 Moderate 20-35 Severe   PMH: Past Medical History:  Diagnosis Date  . COPD (chronic obstructive pulmonary disease) (HCC)   . Hematuria   . Hyperlipidemia   . Stroke [facility])     Surgical History: Past Surgical History:  Procedure Laterality  Date  . COLONOSCOPY  2011   normal- MD docs    Home Medications:  Allergies as of 02/13/2020   No Known Allergies     Medication List       Accurate as of February 13, 2020 11:39 AM. If you have any questions, ask your nurse or doctor.        albuterol 108 (90 Base) MCG/ACT inhaler Commonly known as: VENTOLIN HFA Inhale 2 puffs into the lungs every 6 (six) hours as needed for wheezing or shortness of breath.   atorvastatin 80 MG tablet Commonly known as: LIPITOR Take 1 tablet (80 mg total) by mouth daily.   clopidogrel 75 MG tablet Commonly known as: PLAVIX Take 1 tablet (75 mg total) by mouth daily.   ketoconazole 2 % cream Commonly known as: NIZORAL APP EXT AA QD   meloxicam 7.5 MG tablet Commonly known as: MOBIC Take 1 tablet (7.5 mg total) by mouth daily.   montelukast  10 MG tablet Commonly known as: SINGULAIR Take 1 tablet (10 mg total) by mouth at bedtime.   tamsulosin 0.4 MG Caps capsule Commonly known as: FLOMAX Take 1 capsule (0.4 mg total) by mouth daily. What changed: Another medication with the same name was added. Make sure you understand how and when to take each. Changed by: Michiel Cowboy, PA-C   tamsulosin 0.4 MG Caps capsule Commonly known as: FLOMAX Take 1 capsule (0.4 mg total) by mouth daily. What changed: You were already taking a medication with the same name, and this prescription was added. Make sure you understand how and when to take each. Changed by: Michiel Cowboy, PA-C   Trelegy Ellipta 100-62.5-25 MCG/INH Aepb Generic drug: Fluticasone-Umeclidin-Vilant Inhale 1 puff into the lungs daily.   umeclidinium-vilanterol 62.5-25 MCG/INH Aepb Commonly known as: ANORO ELLIPTA Inhale into the lungs. Dr Meredeth Ide       Allergies: No Known Allergies  Family History: Family History  Family history unknown: Yes    Social History:  reports that he has been smoking cigarettes. He has a 1.20 pack-year smoking history. He has never used smokeless tobacco. He reports current alcohol use of about 1.0 standard drinks of alcohol per week. He reports current drug use.  ROS: For pertinent review of systems please refer to history of present illness  Physical Exam: BP (!) 160/79   Pulse (!) 59   Ht 6' (1.829 m)   Wt 122 lb (55.3 kg)   BMI 16.55 kg/m   Constitutional:  Well nourished. Alert and oriented, No acute distress. HEENT:  AT, mask in place.  Trachea midline, no masses. Cardiovascular: No clubbing, cyanosis, or edema. Respiratory: Normal respiratory effort, no increased work of breathing. Neurologic: Grossly intact, no focal deficits, moving all 4 extremities. Psychiatric: Normal mood and affect.   Laboratory Data: PSA History  1.0 in 05/2014  0.9 in 02/2016  2.0 in 08/2016  0.82 in 05/2017  0.88 in  11/2017   1.05 in 07/2018  1.9 in 05/2019  Component     Latest Ref Rng & Units 03/14/2016 09/15/2016 05/30/2019  Prostate Specific Ag, Serum     0.0 - 4.0 ng/mL 0.9 2.0 1.9    Lab Results  Component Value Date   CREATININE 1.22 05/30/2019       Component Value Date/Time   CHOL 177 06/29/2018 1043   HDL 54 06/29/2018 1043   CHOLHDL 3.3 06/29/2018 1043   LDLCALC 110 (H) 06/29/2018 1043   Urinalysis Component     Latest Ref  Rng & Units 02/13/2020  Color, Urine     YELLOW YELLOW  Appearance     CLEAR CLEAR  Specific Gravity, Urine     1.005 - 1.030 1.025  pH     5.0 - 8.0 5.5  Glucose, UA     NEGATIVE mg/dL NEGATIVE  Hgb urine dipstick     NEGATIVE MODERATE (A)  Bilirubin Urine     NEGATIVE NEGATIVE  Ketones, ur     NEGATIVE mg/dL NEGATIVE  Protein     NEGATIVE mg/dL NEGATIVE  Nitrite     NEGATIVE NEGATIVE  Leukocytes,Ua     NEGATIVE NEGATIVE  Squamous Epithelial / LPF     0 - 5 0-5  WBC, UA     0 - 5 WBC/hpf 0-5  RBC / HPF     0 - 5 RBC/hpf 0-5  Bacteria, UA     NONE SEEN RARE (A)  Mucus      PRESENT   I have reviewed the labs  Pertinent Imaging Results for ANKUR, SNOWDON "FRED" (MRN [id number]) as of 02/13/2020 12:43  Ref. Range 02/13/2020 11:30  Scan Result Unknown 68ml    Assessment & Plan:    1. BPH with LUTS IPSS score is 8/1, it is stable Continue conservative management, avoiding bladder irritants and timed voiding's Restart tamsulosin 0.4 mg daily - prescription sent to pharmacy  Also advised patient that if restarting the tamsulosin did relieve his urinary symptoms, to discontinue drinking any alcohol in the evenings can have a beer with lunch instead to see if that would reduce the early morning frequency RTC in symptoms do not improve or worsen  2. History of hematuria Hematuria work up completed in 07/2019 - findings positive for BPH No report of gross hematuria  UA today negative for heme  Patient to report any gross  hematuria in the interim              Return if symptoms worsen or fail to improve.  These notes generated with voice recognition software. I apologize for typographical errors.  Zara Council, PA-C  [facility] [address] [place], [place] [address] [phone]

## 2020-02-13 NOTE — Progress Notes (Signed)
Date:  02/13/2020   Name:  Nathaniel Garcia   DOB:  Jul 29, 1946   MRN:  [id number]   Chief Complaint: Toe Pain (L) great toe- hurting x 2 weeks. snagged toenial on sock)  Toe Pain  The incident occurred more than 1 week ago. There was no injury mechanism (5/20 ). The pain is present in the left toes (left great toe). The quality of the pain is described as aching. The pain is mild. The pain has been fluctuating since onset. Pertinent negatives include no inability to bear weight, loss of motion, loss of sensation, muscle weakness, numbness or tingling. Exacerbated by: putting sock on. He has tried nothing for the symptoms.    Lab Results  Component Value Date   CREATININE 1.22 05/30/2019   BUN 15 05/30/2019   NA 144 05/30/2019   K 5.5 (H) 05/30/2019   CL 105 05/30/2019   CO2 24 05/30/2019   Lab Results  Component Value Date   CHOL 177 06/29/2018   HDL 54 06/29/2018   LDLCALC 110 (H) 06/29/2018   TRIG 64 06/29/2018   CHOLHDL 3.3 06/29/2018   Lab Results  Component Value Date   TSH 1.650 05/30/2019   No results found for: HGBA1C Lab Results  Component Value Date   WBC 6.5 05/30/2019   HGB 14.9 05/30/2019   HCT 44.4 05/30/2019   MCV 96 05/30/2019   PLT 220 05/30/2019   Lab Results  Component Value Date   ALT 13 05/30/2019   AST 19 05/30/2019   ALKPHOS 72 05/30/2019   BILITOT 0.4 05/30/2019     Review of Systems  Constitutional: Negative for chills and fever.  HENT: Negative for drooling, ear discharge, ear pain and sore throat.   Respiratory: Negative for cough, shortness of breath and wheezing.   Cardiovascular: Negative for chest pain, palpitations and leg swelling.  Gastrointestinal: Negative for abdominal pain, blood in stool, constipation, diarrhea and nausea.  Endocrine: Negative for polydipsia.  Genitourinary: Negative for dysuria, frequency, hematuria and urgency.  Musculoskeletal: Negative for back pain, myalgias and neck pain.  Skin: Negative  for rash.  Allergic/Immunologic: Negative for environmental allergies.  Neurological: Negative for dizziness, tingling, numbness and headaches.  Hematological: Does not bruise/bleed easily.  Psychiatric/Behavioral: Negative for suicidal ideas. The patient is not nervous/anxious.     Patient Active Problem List   Diagnosis Date Noted  . Aortic atherosclerosis (HCC) 12/30/2018  . Benign hematuria 09/18/2016  . Weakness of left hand 07/02/2016  . Lipid screening 01/03/2016  . Cerebral vascular disease 09/14/2015  . Carotid artery narrowing 08/15/2015  . Bilateral carotid artery stenosis 08/15/2015  . Bradycardia 08/01/2015  . H/O transient cerebral ischemia 08/01/2015  . Combined fat and carbohydrate induced hyperlipemia 08/01/2015  . Breathlessness on exertion 07/30/2015  . Alcoholic (HCC) 04/03/2015  . Absolute anemia 04/03/2015  . 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  . Cerebral vascular accident (HCC) 04/03/2015  . HLD (hyperlipidemia) 04/03/2015  . Blood glucose elevated 04/03/2015  . Lung nodule, solitary 04/03/2015  . Non compliance with medical treatment 04/03/2015  . Lung nodule, multiple 04/03/2015  . Cerebral infarction (HCC) 04/03/2015  . Abnormal lung field 04/03/2015  . Alcohol dependence (HCC) 04/03/2015    No Known Allergies  Past Surgical History:  Procedure Laterality Date  . COLONOSCOPY  2011   normal- MD docs    Social History   Tobacco Use  . Smoking status: Current Some Day Smoker    Packs/day: 0.02    Years: 60.00  Pack years: 1.20    Types: Cigarettes  . Smokeless tobacco: Never Used  . Tobacco comment: [place] smoking cessation information provided  Substance Use Topics  . Alcohol use: Yes    Alcohol/week: 1.0 standard drinks    Types: 1 Cans of beer per week  . Drug use: Yes    Comment: marijuana     Medication list has been reviewed and updated.  Current Meds  Medication Sig  . albuterol (PROVENTIL HFA;VENTOLIN HFA) 108  (90 Base) MCG/ACT inhaler Inhale 2 puffs into the lungs every 6 (six) hours as needed for wheezing or shortness of breath.  Marland Kitchen atorvastatin (LIPITOR) 80 MG tablet Take 1 tablet (80 mg total) by mouth daily.  . clopidogrel (PLAVIX) 75 MG tablet Take 1 tablet (75 mg total) by mouth daily.  . tamsulosin (FLOMAX) 0.4 MG CAPS capsule Take 1 capsule (0.4 mg total) by mouth daily.  . TRELEGY ELLIPTA 100-62.5-25 MCG/INH AEPB Inhale 1 puff into the lungs daily.  . [DISCONTINUED] tamsulosin (FLOMAX) 0.4 MG CAPS capsule Take 1 capsule (0.4 mg total) by mouth daily.    PHQ 2/9 Scores 02/13/2020 08/29/2019 06/20/2019 03/18/2019  PHQ - 2 Score 0 0 0 0  PHQ- 9 Score 0 - - 0    BP Readings from Last 3 Encounters:  02/13/20 120/70  02/13/20 (!) 160/79  09/12/19 120/80    Physical Exam Vitals and nursing note reviewed.  HENT:     Head: Normocephalic.     Right Ear: Tympanic membrane, ear canal and external ear normal.     Left Ear: Tympanic membrane and external ear normal.     Nose: Nose normal.  Eyes:     General: No scleral icterus.       Right eye: No discharge.        Left eye: No discharge.     Conjunctiva/sclera: Conjunctivae normal.     Pupils: Pupils are equal, round, and reactive to light.  Neck:     Thyroid: No thyromegaly.     Vascular: No JVD.     Trachea: No tracheal deviation.  Cardiovascular:     Rate and Rhythm: Normal rate and regular rhythm.     Pulses: Normal pulses.     Heart sounds: Normal heart sounds. No murmur. No friction rub. No gallop.   Pulmonary:     Effort: No respiratory distress.     Breath sounds: Normal breath sounds. No wheezing or rales.  Abdominal:     General: Bowel sounds are normal.     Palpations: Abdomen is soft. There is no mass.     Tenderness: There is no abdominal tenderness. There is no guarding or rebound.  Musculoskeletal:        General: No tenderness. Normal range of motion.     Cervical back: Normal range of motion and neck supple.  Feet:      Right foot:     Skin integrity: Callus and dry skin present. No ulcer, blister or skin breakdown.     Toenail Condition: Right toenails are normal.     Left foot:     Skin integrity: Callus and dry skin present. No ulcer, blister or skin breakdown.     Toenail Condition: Left toenails are abnormally thick and ingrown.     Comments: Abnormal toenail growth as if double growth nail grew underneath a avulsed nail. Lymphadenopathy:     Cervical: No cervical adenopathy.  Skin:    General: Skin is warm.  Findings: No rash.  Neurological:     Mental Status: He is alert and oriented to person, place, and time.     Cranial Nerves: No cranial nerve deficit.     Deep Tendon Reflexes: Reflexes are normal and symmetric.     Wt Readings from Last 3 Encounters:  02/13/20 123 lb (55.8 kg)  02/13/20 122 lb (55.3 kg)  09/12/19 125 lb (56.7 kg)    BP 120/70   Pulse 72   Ht 6' (1.829 m)   Wt 123 lb (55.8 kg)   BMI 16.68 kg/m   Assessment and Plan: 1. Ingrowing left great toenail Chronic.  Persistent.  Unresolved.  There is a ingrowing aspect of the left great toenail on the medial aspect.  Will refer to podiatry for evaluation and treatment. - Ambulatory referral to Podiatry  2. Avulsed toenail, sequela The nail does not look like it is taking as much as it looks like it may be a double nail it looks like that there was a history of an evulsed nail about a year ago last May and that the proximal nail groove of underneath the false nail and carried carried it up as a thickened nail.  3. Onychomycosis There are some brittleness and discoloration of the nail which could be consistent with onychomycosis which was treated as such by dermatology with topical and I think may be perhaps Lamisil as well this will be evaluated by podiatry to see if a portion of the nail needs to be removed as well.

## 2020-02-14 DIAGNOSIS — S90122A Contusion of left lesser toe(s) without damage to nail, initial encounter: Secondary | ICD-10-CM | POA: Diagnosis not present

## 2020-02-14 DIAGNOSIS — B351 Tinea unguium: Secondary | ICD-10-CM | POA: Diagnosis not present

## 2020-02-14 DIAGNOSIS — M79675 Pain in left toe(s): Secondary | ICD-10-CM | POA: Diagnosis not present

## 2020-02-14 DIAGNOSIS — S9032XA Contusion of left foot, initial encounter: Secondary | ICD-10-CM | POA: Diagnosis not present

## 2020-03-07 DIAGNOSIS — R69 Illness, unspecified: Secondary | ICD-10-CM | POA: Diagnosis not present

## 2020-03-27 DIAGNOSIS — M79675 Pain in left toe(s): Secondary | ICD-10-CM | POA: Diagnosis not present

## 2020-03-27 DIAGNOSIS — S9032XD Contusion of left foot, subsequent encounter: Secondary | ICD-10-CM | POA: Diagnosis not present

## 2020-03-27 DIAGNOSIS — L03032 Cellulitis of left toe: Secondary | ICD-10-CM | POA: Diagnosis not present

## 2020-03-27 DIAGNOSIS — I739 Peripheral vascular disease, unspecified: Secondary | ICD-10-CM | POA: Diagnosis not present

## 2020-03-27 DIAGNOSIS — S90122D Contusion of left lesser toe(s) without damage to nail, subsequent encounter: Secondary | ICD-10-CM | POA: Diagnosis not present

## 2020-03-28 DIAGNOSIS — L03032 Cellulitis of left toe: Secondary | ICD-10-CM | POA: Diagnosis not present

## 2020-05-05 DIAGNOSIS — Z7902 Long term (current) use of antithrombotics/antiplatelets: Secondary | ICD-10-CM | POA: Diagnosis not present

## 2020-05-05 DIAGNOSIS — Z823 Family history of stroke: Secondary | ICD-10-CM | POA: Diagnosis not present

## 2020-05-05 DIAGNOSIS — J449 Chronic obstructive pulmonary disease, unspecified: Secondary | ICD-10-CM | POA: Diagnosis not present

## 2020-05-05 DIAGNOSIS — Z720 Tobacco use: Secondary | ICD-10-CM | POA: Diagnosis not present

## 2020-05-05 DIAGNOSIS — Z8249 Family history of ischemic heart disease and other diseases of the circulatory system: Secondary | ICD-10-CM | POA: Diagnosis not present

## 2020-05-05 DIAGNOSIS — Z681 Body mass index (BMI) 19 or less, adult: Secondary | ICD-10-CM | POA: Diagnosis not present

## 2020-05-05 DIAGNOSIS — E785 Hyperlipidemia, unspecified: Secondary | ICD-10-CM | POA: Diagnosis not present

## 2020-05-05 DIAGNOSIS — Z7951 Long term (current) use of inhaled steroids: Secondary | ICD-10-CM | POA: Diagnosis not present

## 2020-05-05 DIAGNOSIS — R636 Underweight: Secondary | ICD-10-CM | POA: Diagnosis not present

## 2020-05-05 DIAGNOSIS — R030 Elevated blood-pressure reading, without diagnosis of hypertension: Secondary | ICD-10-CM | POA: Diagnosis not present

## 2020-05-11 ENCOUNTER — Other Ambulatory Visit: Payer: Self-pay | Admitting: *Deleted

## 2020-05-11 DIAGNOSIS — R3129 Other microscopic hematuria: Secondary | ICD-10-CM

## 2020-05-11 DIAGNOSIS — N400 Enlarged prostate without lower urinary tract symptoms: Secondary | ICD-10-CM

## 2020-05-13 NOTE — Progress Notes (Signed)
05/14/2020 11:21 AM   Nathaniel Garcia Mar 23, 1946 [id number]  Referring provider: Duanne Limerick, MD [address] [place],  [place] [address]  Chief Complaint  Patient presents with   Follow-up    frequency increase    HPI: 74 yo male with a history of hematuria and BPH with LU TS who presents for urinary frequency.   History of hematuria (high risk) Smoker.  CTU 09/2016 revealed adrenal glands are unremarkable. No urinary stones. Ureters are decompressed. No filling defects in the intrarenal collecting systems, ureters or bladder.Prostate is enlarged and slightly indents the bladder base. He underwent cystoscopy in January 2018 and no worrisome findings were discovered.  RUS on 08/12/2018 revealed right kidney is rather small with slight increase in renal echogenicity. Question renal artery stenosis as a potential etiology for these findings. In this regard, question whether patient is hypertensive. Note that similar changes of this nature are not noted on the left.  No obstructing focus in either kidney.  No renal mass evident.  Mildly prominent prostate with mild inhomogeneous echotexture within the prostate.  Cystoscopy with Dr. Richardo Hanks 07/2019 was NED.  He has not had any gross hematuria.  UA is negative for micro heme.   BPH WITH LUTS  (prostate and/or bladder) IPSS score: 7/1   PVR: 55 mL   Previous score: 8/1  Previous PVR: 69 mL  Major complaint(s): Frequency in the am.  He states he gets up at 5 AM due to the urge to urinate and voids, he then lays back down and has to get back up a couple more times to relieve his bladder.  He states he has been drinking beer with dinner.  Patient denies any modifying or aggravating factors.  Patient denies any gross hematuria, dysuria or suprapubic/flank pain.  Patient denies any fevers, chills, nausea or vomiting.   Currently taking: tamsulosin 0.4 mg daily.    IPSS    Row Name 05/14/20 1100         International  Prostate Symptom Score   How often have you had the sensation of not emptying your bladder? Less than 1 in 5     How often have you had to urinate less than every two hours? Less than half the time     How often have you found you stopped and started again several times when you urinated? Not at All     How often have you found it difficult to postpone urination? Less than 1 in 5 times     How often have you had a weak urinary stream? Less than 1 in 5 times     How often have you had to strain to start urination? Not at All     How many times did you typically get up at night to urinate? 2 Times     Total IPSS Score 7       Quality of Life due to urinary symptoms   If you were to spend the rest of your life with your urinary condition just the way it is now how would you feel about that? Pleased            Score:  1-7 Mild 8-19 Moderate 20-35 Severe   PMH: Past Medical History:  Diagnosis Date   COPD (chronic obstructive pulmonary disease) (HCC)    Hematuria    Hyperlipidemia    Stroke [facility])     Surgical History: Past Surgical History:  Procedure Laterality Date  COLONOSCOPY  2011   normal- MD docs    Home Medications:  Allergies as of 05/14/2020   No Known Allergies     Medication List       Accurate as of May 14, 2020 11:21 AM. If you have any questions, ask your nurse or doctor.        albuterol 108 (90 Base) MCG/ACT inhaler Commonly known as: VENTOLIN HFA Inhale 2 puffs into the lungs every 6 (six) hours as needed for wheezing or shortness of breath.   atorvastatin 80 MG tablet Commonly known as: LIPITOR Take 1 tablet (80 mg total) by mouth daily.   clopidogrel 75 MG tablet Commonly known as: PLAVIX Take 1 tablet (75 mg total) by mouth daily.   ketoconazole 2 % cream Commonly known as: NIZORAL APP EXT AA QD   tamsulosin 0.4 MG Caps capsule Commonly known as: FLOMAX Take 1 capsule (0.4 mg total) by mouth daily.   Trelegy Ellipta  100-62.5-25 MCG/INH Aepb Generic drug: Fluticasone-Umeclidin-Vilant Inhale 1 puff into the lungs daily.   umeclidinium-vilanterol 62.5-25 MCG/INH Aepb Commonly known as: ANORO ELLIPTA Inhale into the lungs. Dr Raul Del       Allergies: No Known Allergies  Family History: Family History  Family history unknown: Yes    Social History:  reports that he has been smoking cigarettes. He has a 1.20 pack-year smoking history. He has never used smokeless tobacco. He reports current alcohol use of about 1.0 standard drink of alcohol per week. He reports current drug use.  ROS: For pertinent review of systems please refer to history of present illness  Physical Exam: BP (!) 149/79    Pulse 67    Ht 6\' 1"  (1.854 m)    Wt 124 lb (56.2 kg)    BMI 16.36 kg/m   Constitutional:  Well nourished. Alert and oriented, No acute distress. HEENT: [place] AT, mask in place.  Trachea midline Cardiovascular: No clubbing, cyanosis, or edema. Respiratory: Normal respiratory effort, no increased work of breathing. GI: Abdomen is soft, non tender, non distended, no abdominal masses. Liver and spleen not palpable.  No hernias appreciated.  Stool sample for occult testing is not indicated.   GU: No CVA tenderness.  No bladder fullness or masses.  Patient with circumcised phallus.  Urethral meatus is patent.  No penile discharge. No penile lesions or rashes. Scrotum without lesions, cysts, rashes and/or edema.  Testicles are located scrotally bilaterally. No masses are appreciated in the testicles. Left and right epididymis are normal. Rectal: Patient with  normal sphincter tone. Anus and perineum without scarring or rashes. No rectal masses are appreciated. Prostate is approximately 70 grams, no nodules are appreciated. Seminal vesicles could not be palpated Skin: No rashes, bruises or suspicious lesions. Lymph: No inguinal adenopathy. Neurologic: Grossly intact, no focal deficits, moving all 4  extremities. Psychiatric: Normal mood and affect.   Laboratory Data: PSA History  1.0 in 05/2014  0.9 in 02/2016  2.0 in 08/2016  0.82 in 05/2017  0.88 in 11/2017   1.05 in 07/2018  1.9 in 05/2019  Component     Latest Ref Rng & Units 03/14/2016 09/15/2016 05/30/2019  Prostate Specific Ag, Serum     0.0 - 4.0 ng/mL 0.9 2.0 1.9    Lab Results  Component Value Date   CREATININE 1.22 05/30/2019       Component Value Date/Time   CHOL 177 06/29/2018 1043   HDL 54 06/29/2018 1043   CHOLHDL 3.3 06/29/2018 1043   LDLCALC  110 (H) 06/29/2018 1043   Urinalysis Component     Latest Ref Rng & Units 05/14/2020  Color, Urine     YELLOW YELLOW  Appearance     CLEAR CLEAR  Specific Gravity, Urine     1.005 - 1.030 1.025  pH     5.0 - 8.0 7.0  Glucose, UA     NEGATIVE mg/dL NEGATIVE  Hgb urine dipstick     NEGATIVE TRACE (A)  Bilirubin Urine     NEGATIVE NEGATIVE  Ketones, ur     NEGATIVE mg/dL NEGATIVE  Protein     NEGATIVE mg/dL NEGATIVE  Nitrite     NEGATIVE NEGATIVE  Leukocytes,Ua     NEGATIVE NEGATIVE  Squamous Epithelial / LPF     0 - 5 0-5  WBC, UA     0 - 5 WBC/hpf NONE SEEN  RBC / HPF     0 - 5 RBC/hpf 0-5  Bacteria, UA     NONE SEEN NONE SEEN   I have reviewed the labs  Pertinent Imaging Results for Nathaniel Garcia, Nathaniel "FRED" (MRN [id number]) as of 05/14/2020 11:16  Ref. Range 05/14/2020 11:12  Scan Result Unknown 55 ml     Assessment & Plan:    1. BPH with LUTS IPSS score is 8/1, it is stable Continue conservative management, avoiding bladder irritants and timed voiding's Continue tamsulosin 0.4 mg daily  RTC in one year for I PSS, PVR and exam   2. History of hematuria Hematuria work up completed in 07/2019 - findings positive for BPH No report of gross hematuria  UA today negative for heme  Patient to report any gross hematuria in the interim              Return in about 1 year (around 05/14/2021) for IPSS, PVR, UA and exam.  These  notes generated with voice recognition software. I apologize for typographical errors.  Michiel Cowboy, PA-C  [facility] [address] [place], [place] [address] [phone]

## 2020-05-14 ENCOUNTER — Other Ambulatory Visit
Admission: RE | Admit: 2020-05-14 | Discharge: 2020-05-14 | Disposition: A | Discharge status: Home / Self Care | Payer: Medicare HMO | Attending: Urology | Admitting: Urology

## 2020-05-14 ENCOUNTER — Ambulatory Visit (INDEPENDENT_AMBULATORY_CARE_PROVIDER_SITE_OTHER): Payer: Medicare HMO | Admitting: Urology

## 2020-05-14 ENCOUNTER — Other Ambulatory Visit: Payer: Self-pay

## 2020-05-14 ENCOUNTER — Encounter: Payer: Self-pay | Admitting: Urology

## 2020-05-14 VITALS — BP 149/79 | HR 67 | Ht 73.0 in | Wt 124.0 lb

## 2020-05-14 DIAGNOSIS — R3129 Other microscopic hematuria: Secondary | ICD-10-CM | POA: Diagnosis not present

## 2020-05-14 DIAGNOSIS — N138 Other obstructive and reflux uropathy: Secondary | ICD-10-CM

## 2020-05-14 DIAGNOSIS — N400 Enlarged prostate without lower urinary tract symptoms: Secondary | ICD-10-CM | POA: Diagnosis not present

## 2020-05-14 DIAGNOSIS — N401 Enlarged prostate with lower urinary tract symptoms: Secondary | ICD-10-CM

## 2020-05-14 DIAGNOSIS — Z87448 Personal history of other diseases of urinary system: Secondary | ICD-10-CM

## 2020-05-14 LAB — URINALYSIS, COMPLETE (UACMP) WITH MICROSCOPIC
Bacteria, UA: NONE SEEN
Bilirubin Urine: NEGATIVE
Glucose, UA: NEGATIVE mg/dL
Ketones, ur: NEGATIVE mg/dL
Leukocytes,Ua: NEGATIVE
Nitrite: NEGATIVE
Protein, ur: NEGATIVE mg/dL
Specific Gravity, Urine: 1.025 (ref 1.005–1.030)
WBC, UA: NONE SEEN WBC/hpf (ref 0–5)
pH: 7 (ref 5.0–8.0)

## 2020-05-14 LAB — BLADDER SCAN AMB NON-IMAGING: Scan Result: 55

## 2020-05-14 NOTE — Addendum Note (Signed)
Addended by: Jodean Lima H on: 05/14/2020 10:41 AM   Modules accepted: Orders

## 2020-05-14 NOTE — Addendum Note (Signed)
Addended by: Sueanne Margarita on: 05/14/2020 10:10 AM   Modules accepted: Orders

## 2020-05-14 NOTE — Addendum Note (Signed)
Addended by: Sueanne Margarita on: 05/14/2020 11:22 AM   Modules accepted: Orders

## 2020-05-31 ENCOUNTER — Ambulatory Visit (INDEPENDENT_AMBULATORY_CARE_PROVIDER_SITE_OTHER): Payer: Medicare HMO | Admitting: Family Medicine

## 2020-05-31 ENCOUNTER — Encounter: Payer: Self-pay | Admitting: Family Medicine

## 2020-05-31 ENCOUNTER — Other Ambulatory Visit: Payer: Self-pay

## 2020-05-31 VITALS — BP 138/58 | HR 68 | Ht 73.0 in | Wt 121.0 lb

## 2020-05-31 DIAGNOSIS — I679 Cerebrovascular disease, unspecified: Secondary | ICD-10-CM

## 2020-05-31 DIAGNOSIS — J209 Acute bronchitis, unspecified: Secondary | ICD-10-CM | POA: Diagnosis not present

## 2020-05-31 DIAGNOSIS — J440 Chronic obstructive pulmonary disease with acute lower respiratory infection: Secondary | ICD-10-CM | POA: Diagnosis not present

## 2020-05-31 DIAGNOSIS — I700 Atherosclerosis of aorta: Secondary | ICD-10-CM | POA: Diagnosis not present

## 2020-05-31 DIAGNOSIS — E785 Hyperlipidemia, unspecified: Secondary | ICD-10-CM | POA: Diagnosis not present

## 2020-05-31 DIAGNOSIS — J432 Centrilobular emphysema: Secondary | ICD-10-CM | POA: Diagnosis not present

## 2020-05-31 MED ORDER — ATORVASTATIN CALCIUM 80 MG PO TABS: 80.0000 mg | ORAL_TABLET | Freq: Every day | ORAL | 1 refills | Status: DC

## 2020-05-31 MED ORDER — TRELEGY ELLIPTA 100-62.5-25 MCG/INH IN AEPB: 1.0000 | INHALATION_SPRAY | Freq: Every day | RESPIRATORY_TRACT | 11 refills | Status: DC

## 2020-05-31 MED ORDER — ALBUTEROL SULFATE HFA 108 (90 BASE) MCG/ACT IN AERS: 2.0000 | INHALATION_SPRAY | Freq: Four times a day (QID) | RESPIRATORY_TRACT | 11 refills | Status: DC | PRN

## 2020-05-31 MED ORDER — CLOPIDOGREL BISULFATE 75 MG PO TABS: 75.0000 mg | ORAL_TABLET | Freq: Every day | ORAL | 1 refills | Status: DC

## 2020-05-31 NOTE — Progress Notes (Signed)
Date:  05/31/2020   Name:  Nathaniel Garcia   DOB:  [birth year]   MRN:  [id number]   Chief Complaint: Hyperlipidemia, Cerebrovascular Accident, and Nasal Congestion  Hyperlipidemia This is a chronic problem. The current episode started more than 1 year ago. The problem is controlled. Recent lipid tests were reviewed and are normal. He has no history of chronic renal disease, diabetes, hypothyroidism, liver disease or obesity. There are no known factors aggravating his hyperlipidemia. Pertinent negatives include no chest pain, focal sensory loss, focal weakness, leg pain, myalgias or shortness of breath. Current antihyperlipidemic treatment includes statins. The current treatment provides moderate improvement of lipids. There are no compliance problems.  Risk factors for coronary artery disease include male sex.  Neurologic Problem The patient's pertinent negatives include no altered mental status, clumsiness, focal sensory loss, focal weakness, loss of balance, memory loss, near-syncope or slurred speech. Primary symptoms comment: hx of cva. This is a chronic problem. The current episode started more than 1 year ago. The problem has been gradually improving since onset. Pertinent negatives include no abdominal pain, back pain, chest pain, dizziness, fever, headaches, nausea, neck pain, palpitations or shortness of breath. There is no history of liver disease.    Lab Results  Component Value Date   CREATININE 1.22 05/30/2019   BUN 15 05/30/2019   NA 144 05/30/2019   K 5.5 (H) 05/30/2019   CL 105 05/30/2019   CO2 24 05/30/2019   Lab Results  Component Value Date   CHOL 177 06/29/2018   HDL 54 06/29/2018   LDLCALC 110 (H) 06/29/2018   TRIG 64 06/29/2018   CHOLHDL 3.3 06/29/2018   Lab Results  Component Value Date   TSH 1.650 05/30/2019   No results found for: HGBA1C Lab Results  Component Value Date   WBC 6.5 05/30/2019   HGB 14.9 05/30/2019   HCT 44.4 05/30/2019   MCV 96  05/30/2019   PLT 220 05/30/2019   Lab Results  Component Value Date   ALT 13 05/30/2019   AST 19 05/30/2019   ALKPHOS 72 05/30/2019   BILITOT 0.4 05/30/2019     Review of Systems  Constitutional: Negative for chills and fever.  HENT: Negative for drooling, ear discharge, ear pain and sore throat.   Respiratory: Negative for cough, shortness of breath and wheezing.   Cardiovascular: Negative for chest pain, palpitations, leg swelling and near-syncope.  Gastrointestinal: Negative for abdominal pain, blood in stool, constipation, diarrhea and nausea.  Endocrine: Negative for polydipsia.  Genitourinary: Negative for dysuria, frequency, hematuria and urgency.  Musculoskeletal: Negative for back pain, myalgias and neck pain.  Skin: Negative for rash.  Allergic/Immunologic: Negative for environmental allergies.  Neurological: Negative for dizziness, focal weakness, headaches and loss of balance.  Hematological: Does not bruise/bleed easily.  Psychiatric/Behavioral: Negative for memory loss and suicidal ideas. The patient is not nervous/anxious.     Patient Active Problem List   Diagnosis Date Noted  . Aortic atherosclerosis (HCC) 12/30/2018  . Benign hematuria 09/18/2016  . Weakness of left hand 07/02/2016  . Lipid screening 01/03/2016  . Cerebral vascular disease 09/14/2015  . Carotid artery narrowing 08/15/2015  . Bilateral carotid artery stenosis 08/15/2015  . Bradycardia 08/01/2015  . H/O transient cerebral ischemia 08/01/2015  . Combined fat and carbohydrate induced hyperlipemia 08/01/2015  . Breathlessness on exertion 07/30/2015  . Alcoholic (HCC) 04/03/2015  . Absolute anemia 04/03/2015  . 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  . Cerebral vascular accident (HCC) 04/03/2015  .  HLD (hyperlipidemia) 04/03/2015  . Blood glucose elevated 04/03/2015  . Lung nodule, solitary 04/03/2015  . Non compliance with medical treatment 04/03/2015  . Lung nodule, multiple 04/03/2015    . Cerebral infarction (HCC) 04/03/2015  . Abnormal lung field 04/03/2015  . Alcohol dependence (HCC) 04/03/2015    No Known Allergies  Past Surgical History:  Procedure Laterality Date  . COLONOSCOPY  2011   normal- [facility]    Social History   Tobacco Use  . Smoking status: Current Some Day Smoker    Packs/day: 0.02    Years: 60.00    Pack years: 1.20    Types: Cigarettes  . Smokeless tobacco: Never Used  . Tobacco comment: [place] smoking cessation information provided  Vaping Use  . Vaping Use: Never used  Substance Use Topics  . Alcohol use: Yes    Alcohol/week: 1.0 standard drink    Types: 1 Cans of beer per week  . Drug use: Yes    Comment: marijuana     Medication list has been reviewed and updated.  Current Meds  Medication Sig  . albuterol (PROVENTIL HFA;VENTOLIN HFA) 108 (90 Base) MCG/ACT inhaler Inhale 2 puffs into the lungs every 6 (six) hours as needed for wheezing or shortness of breath.  Marland Kitchen atorvastatin (LIPITOR) 80 MG tablet Take 1 tablet (80 mg total) by mouth daily.  . clopidogrel (PLAVIX) 75 MG tablet Take 1 tablet (75 mg total) by mouth daily.  Marland Kitchen ketoconazole (NIZORAL) 2 % cream APP EXT AA QD  . tamsulosin (FLOMAX) 0.4 MG CAPS capsule Take 1 capsule (0.4 mg total) by mouth daily.  . TRELEGY ELLIPTA 100-62.5-25 MCG/INH AEPB Inhale 1 puff into the lungs daily.  Marland Kitchen umeclidinium-vilanterol (ANORO ELLIPTA) 62.5-25 MCG/INH AEPB Inhale into the lungs. Dr Meredeth Ide    [facility] 2/9 Scores 05/31/2020 02/13/2020 08/29/2019 06/20/2019  PHQ - 2 Score 0 0 0 0  PHQ- 9 Score 0 0 - -    GAD 7 : Generalized Anxiety Score 05/31/2020 02/13/2020  Nervous, Anxious, on Edge 0 0  Control/stop worrying 0 0  Worry too much - different things 0 0  Trouble relaxing 0 0  Restless 0 0  Easily annoyed or irritable 0 0  Afraid - awful might happen 0 0  Total GAD 7 Score 0 0    BP Readings from Last 3 Encounters:  05/31/20 (!) 138/58  05/14/20 (!) 149/79  02/13/20 120/70     Physical Exam Vitals and nursing note reviewed.  HENT:     Head: Normocephalic.     Right Ear: Tympanic membrane, ear canal and external ear normal.     Left Ear: Tympanic membrane, ear canal and external ear normal.     Nose: Nose normal.  Eyes:     General: No scleral icterus.       Right eye: No discharge.        Left eye: No discharge.     Conjunctiva/sclera: Conjunctivae normal.     Pupils: Pupils are equal, round, and reactive to light.  Neck:     Thyroid: No thyromegaly.     Vascular: No JVD.     Trachea: No tracheal deviation.  Cardiovascular:     Rate and Rhythm: Normal rate and regular rhythm.     Chest Wall: PMI is not displaced.     Pulses: Normal pulses. No decreased pulses.     Heart sounds: Normal heart sounds, S1 normal and S2 normal. No murmur heard.  No systolic murmur  is present.  No diastolic murmur is present.  No friction rub. No gallop. No S3 or S4 sounds.   Pulmonary:     Effort: No respiratory distress.     Breath sounds: Normal breath sounds. No wheezing or rales.  Abdominal:     General: Bowel sounds are normal.     Palpations: Abdomen is soft. There is no mass.     Tenderness: There is no abdominal tenderness. There is no guarding or rebound.  Musculoskeletal:        General: No tenderness. Normal range of motion.     Cervical back: Normal range of motion and neck supple.     Right lower leg: No edema.     Left lower leg: No edema.  Lymphadenopathy:     Cervical: No cervical adenopathy.  Skin:    General: Skin is warm.     Findings: No rash.  Neurological:     Mental Status: He is alert and oriented to person, place, and time.     Cranial Nerves: No cranial nerve deficit.     Deep Tendon Reflexes: Reflexes are normal and symmetric.     Wt Readings from Last 3 Encounters:  05/31/20 121 lb (54.9 kg)  05/14/20 124 lb (56.2 kg)  02/13/20 123 lb (55.8 kg)    BP (!) 138/58   Pulse 68   Ht 6\' 1"  (1.854 m)   Wt 121 lb (54.9 kg)    BMI 15.96 kg/m   Assessment and Plan:  1. Cerebral vascular disease Chronic.  Controlled.  Stable.  Patient does have a history of a CVA in the remote past.  Patient's had no symptoms of weakness or focal sensory loss.  We are controlling this by optimizing his lipid management and continuance of Plavix 75 mg once a day. - atorvastatin (LIPITOR) 80 MG tablet; Take 1 tablet (80 mg total) by mouth daily.  Dispense: 90 tablet; Refill: 1 - clopidogrel (PLAVIX) 75 MG tablet; Take 1 tablet (75 mg total) by mouth daily.  Dispense: 90 tablet; Refill: 1  2. Hyperlipidemia, unspecified hyperlipidemia type Chronic.  Controlled.  Stable.  Will continue atorvastatin 80 mg once a day.  Will recheck lipid panel on next visit. - atorvastatin (LIPITOR) 80 MG tablet; Take 1 tablet (80 mg total) by mouth daily.  Dispense: 90 tablet; Refill: 1  3. Aortic atherosclerosis (HCC) Noted on chest x-ray that patient has aortic atherosclerosis.  Will optimize lipid control, blood pressure control, and antiplatelet therapy with Plavix 75 mg once a day. - clopidogrel (PLAVIX) 75 MG tablet; Take 1 tablet (75 mg total) by mouth daily.  Dispense: 90 tablet; Refill: 1  4. 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HCC) Chronic.  Controlled.  Stable.  We are currently controlling with albuterol inhaler.  Patient is having some financial issues with obtaining Trelegy and he was given a sample and was refilled with hopes that we can get this by coupon. - albuterol (VENTOLIN HFA) 108 (90 Base) MCG/ACT inhaler; Inhale 2 puffs into the lungs every 6 (six) hours as needed for wheezing or shortness of breath.  Dispense: 6.7 g; Refill: 11 - TRELEGY ELLIPTA 100-62.5-25 MCG/INH AEPB; Inhale 1 puff into the lungs daily.  Dispense: 28 each; Refill: 11  5. Acute bronchitis with chronic obstructive pulmonary disease (COPD) (HCC) Chronic.  Controlled.  Stable.  There is some mild cough that is associated and patient is having some air trapping.  We have  instructed him during the summer heat that he must use his  albuterol on a regular basis. - albuterol (VENTOLIN HFA) 108 (90 Base) MCG/ACT inhaler; Inhale 2 puffs into the lungs every 6 (six) hours as needed for wheezing or shortness of breath.  Dispense: 6.7 g; Refill: 11

## 2020-06-08 ENCOUNTER — Telehealth: Payer: Self-pay | Admitting: *Deleted

## 2020-06-08 DIAGNOSIS — Z87891 Personal history of nicotine dependence: Secondary | ICD-10-CM

## 2020-06-08 DIAGNOSIS — Z122 Encounter for screening for malignant neoplasm of respiratory organs: Secondary | ICD-10-CM

## 2020-06-08 NOTE — Telephone Encounter (Signed)
(  06/08/2020) Pt has been notified that lung cancer screening CT scan is due currently or will be in near future. Confirmed pt is within appropriate age range, and asymptomatic. Pt denies illness that would prevent curative treatment for lung cancer if found. Verified smoking history (Current Smoker, 0.02 ppd ). Pt did receive 2nd COVID VX on (01/23/20) Pt is agreeable for CT scan being scheduled. He is going out of town on 8/4 and will not be back until 8/10. Tentative appt date of 8/19 @ 10:15. SRW

## 2020-06-11 NOTE — Telephone Encounter (Signed)
Smoking history: current, 30.1 pack year

## 2020-06-11 NOTE — Addendum Note (Signed)
Addended by: Jonne Ply on: 06/11/2020 12:43 PM   Modules accepted: Orders

## 2020-06-20 ENCOUNTER — Ambulatory Visit: Payer: Medicare HMO

## 2020-06-27 ENCOUNTER — Ambulatory Visit (INDEPENDENT_AMBULATORY_CARE_PROVIDER_SITE_OTHER): Payer: Medicare HMO

## 2020-06-27 DIAGNOSIS — Z1211 Encounter for screening for malignant neoplasm of colon: Secondary | ICD-10-CM

## 2020-06-27 DIAGNOSIS — Z0000 Encounter for general adult medical examination without abnormal findings: Secondary | ICD-10-CM | POA: Diagnosis not present

## 2020-06-27 NOTE — Progress Notes (Signed)
Subjective:   Nathaniel Garcia is a 74 y.o. male who presents for Medicare Annual/Subsequent preventive examination.  Virtual Visit via Telephone Note  I connected with  Nathaniel KluverFrederick Douglas Vandenboom on 06/27/20 at 10:00 AM EDT by telephone and verified that I am speaking with the correct person using two identifiers.  Medicare Annual Wellness visit completed telephonically due to Covid-19 pandemic.   [facility]   I discussed the limitations, risks, security and privacy concerns of performing an evaluation and management service by telephone and the availability of in person appointments. The patient expressed understanding and agreed to proceed.  Unable to perform video visit due to video visit attempted and failed and/or patient does not have video capability.   Some vital signs may be absent or patient reported.   Reather LittlerKasey Tinley Rought, LPN    Review of Systems     Cardiac Risk Factors include: advanced age (>3055men, 72>65 women);smoking/ tobacco exposure;dyslipidemia;male gender     Objective:    There were no vitals filed for this visit. There is no height or weight on file to calculate BMI.  Advanced Directives 06/27/2020 06/20/2019 06/16/2018 07/18/2015  Does Patient Have a Medical Advance Directive? No No No No  Would patient like information on creating a medical advance directive? Yes (MAU/Ambulatory/Procedural Areas - Information given) No - Patient declined Yes (MAU/Ambulatory/Procedural Areas - Information given) No - patient declined information    Current Medications (verified) Outpatient Encounter Medications as of 06/27/2020  Medication Sig  . albuterol (VENTOLIN HFA) 108 (90 Base) MCG/ACT inhaler Inhale 2 puffs into the lungs every 6 (six) hours as needed for wheezing or shortness of breath.  Marland Kitchen. atorvastatin (LIPITOR) 80 MG tablet Take 1 tablet (80 mg total) by mouth daily.  . clopidogrel (PLAVIX) 75 MG tablet Take 1 tablet (75 mg total) by  mouth daily.  . tamsulosin (FLOMAX) 0.4 MG CAPS capsule Take 1 capsule (0.4 mg total) by mouth daily.  . TRELEGY ELLIPTA 100-62.5-25 MCG/INH AEPB Inhale 1 puff into the lungs daily.  . [DISCONTINUED] ketoconazole (NIZORAL) 2 % cream APP EXT AA QD  . [DISCONTINUED] umeclidinium-vilanterol (ANORO ELLIPTA) 62.5-25 MCG/INH AEPB Inhale into the lungs. Dr Meredeth IdeFleming   No facility-administered encounter medications on file as of 06/27/2020.    Allergies (verified) Patient has no known allergies.   History: Past Medical History:  Diagnosis Date  . COPD (chronic obstructive pulmonary disease) (HCC)   . Hematuria   . Hyperlipidemia   . Stroke [facility](HCC)    Past Surgical History:  Procedure Laterality Date  . COLONOSCOPY  2011   normal- [facility] docs   Family History  Family history unknown: Yes   Social History   Socioeconomic History  . Marital status: Legally Separated    Spouse name: Not on file  . Number of children: 3  . Years of education: some college  . Highest education level: 12th grade  Occupational History  . Occupation: Retired  Tobacco Use  . Smoking status: Current Some Day Smoker    Packs/day: 0.02    Years: 60.00    Pack years: 1.20    Types: Cigarettes  . Smokeless tobacco: Never Used  . Tobacco comment: [facility] smoking cessation information provided  Vaping Use  . Vaping Use: Never used  Substance and Sexual Activity  . Alcohol use: Yes    Alcohol/week: 1.0 standard drink    Types: 1 Cans of beer per week  . Drug use: Yes    Comment: marijuana  .  Sexual activity: Never  Other Topics Concern  . Not on file  Social History Narrative   Pt lives alone.   Social Determinants of Health   Financial Resource Strain: Low Risk   . Difficulty of Paying Living Expenses: Not hard at all  Food Insecurity: No Food Insecurity  . Worried About Programme researcher, broadcasting/film/video in the Last Year: Never true  . Ran Out of Food in the Last Year: Never true  Transportation Needs: No  Transportation Needs  . Lack of Transportation (Medical): No  . Lack of Transportation (Non-Medical): No  Physical Activity: Inactive  . Days of Exercise per Week: 0 days  . Minutes of Exercise per Session: 0 min  Stress: No Stress Concern Present  . Feeling of Stress : Not at all  Social Connections: Socially Isolated  . Frequency of Communication with Friends and Family: More than three times a week  . Frequency of Social Gatherings with Friends and Family: Three times a week  . Attends Religious Services: Never  . Active Member of Clubs or Organizations: No  . Attends Banker Meetings: Never  . Marital Status: Separated    Tobacco Counseling Ready to quit: Not Answered Counseling given: Not Answered Comment: [place] smoking cessation information provided   Clinical Intake:  Pre-visit preparation completed: Yes  Pain : No/denies pain     Nutritional Risks: None Diabetes: No  How often do you need to have someone help you when you read instructions, pamphlets, or other written materials from your doctor or pharmacy?: 1 - Never    Interpreter Needed?: No  Information entered by :: Reather Littler LPN   Activities of Daily Living In your present state of health, do you have any difficulty performing the following activities: 06/27/2020  Hearing? N  Comment declines hearing aids  Vision? N  Difficulty concentrating or making decisions? N  Walking or climbing stairs? N  Dressing or bathing? N  Doing errands, shopping? N  Preparing Food and eating ? N  Using the Toilet? N  In the past six months, have you accidently leaked urine? N  Do you have problems with loss of bowel control? N  Managing your Medications? N  Managing your Finances? N  Housekeeping or managing your Housekeeping? N  Some recent data might be hidden    Patient Care Team: Duanne Limerick, MD as PCP - General (Family Medicine) Hulan Fray as Physician Assistant  (Urology) Mertie Moores, MD as Consulting Physician (Specialist)  Indicate any recent Medical Services you may have received from other than [facility] providers in the past year (date may be approximate).     Assessment:   This is a routine wellness examination for Kirtan.  Hearing/Vision screen  Hearing Screening   125Hz  250Hz  500Hz  1000Hz  2000Hz  3000Hz  4000Hz  6000Hz  8000Hz   Right ear:           Left ear:           Comments: Pt denies hearing difficulty  Vision Screening Comments: Annual vision screenings at [facility] [address] [place]  Dietary issues and exercise activities discussed: Current Exercise Habits: The patient does not participate in regular exercise at present, Exercise limited by: respiratory conditions(s)  Goals    . DIET - INCREASE WATER INTAKE     Recommend to drink at least 6-8 8oz glasses of water per day.      Depression Screen PHQ 2/9 Scores 06/27/2020 05/31/2020 02/13/2020 08/29/2019 06/20/2019 03/18/2019 10/25/2018  PHQ - 2 Score 0 0 0 0 0 0 0  PHQ- 9 Score - 0 0 - - 0 0    Fall Risk Fall Risk  06/27/2020 05/31/2020 02/13/2020 08/29/2019 06/20/2019  Falls in the past year? 0 0 0 0 0  Number falls in past yr: 0 - - 0 0  Injury with Fall? 0 - - 0 0  Risk for fall due to : No Fall Risks - - - -  Risk for fall due to: Comment - - - - -  Follow up Falls prevention discussed Falls evaluation completed Falls evaluation completed - Falls prevention discussed    Any stairs in or around the home? Yes  If so, are there any without handrails? No  Home free of loose throw rugs in walkways, pet beds, electrical cords, etc? Yes  Adequate lighting in your home to reduce risk of falls? Yes   ASSISTIVE DEVICES UTILIZED TO PREVENT FALLS:  Life alert? No  Use of a cane, walker or w/c? No  Grab bars in the bathroom? Yes  Shower chair or bench in shower? No  Elevated toilet seat or a handicapped toilet? No   TIMED UP AND GO:  Was the test performed? No . Telephonic  visit.   Cognitive Function:     6CIT Screen 06/27/2020 06/20/2019 06/16/2018  What Year? 0 points 0 points 0 points  What month? 0 points 0 points 0 points  What time? 0 points 0 points 0 points  Count back from 20 0 points 0 points 0 points  Months in reverse 0 points 2 points 0 points  Repeat phrase 0 points 0 points 0 points  Total Score 0 2 0    Immunizations Immunization History  Administered Date(s) Administered  . PFIZER SARS-COV-2 Vaccination 12/29/2019, 01/23/2020    TDAP status: Due, Education has been provided regarding the importance of this vaccine. Advised may receive this vaccine at local pharmacy or Health Dept. Aware to provide a copy of the vaccination record if obtained from local pharmacy or Health Dept. Verbalized acceptance and understanding.   Flu Vaccine status: Declined, Education has been provided regarding the importance of this vaccine but patient still declined. Advised may receive this vaccine at local pharmacy or Health Dept. Aware to provide a copy of the vaccination record if obtained from local pharmacy or Health Dept. Verbalized acceptance and understanding.   Pneumococcal vaccine status: Declined,  Education has been provided regarding the importance of this vaccine but patient still declined. Advised may receive this vaccine at local pharmacy or Health Dept. Aware to provide a copy of the vaccination record if obtained from local pharmacy or Health Dept. Verbalized acceptance and understanding.    Covid-19 vaccine status: Completed vaccines  Qualifies for Shingles Vaccine? Yes   Zostavax completed No   Shingrix Completed?: No.    Education has been provided regarding the importance of this vaccine. Patient has been advised to call insurance company to determine out of pocket expense if they have not yet received this vaccine. Advised may also receive vaccine at local pharmacy or Health Dept. Verbalized acceptance and understanding.  Screening  Tests Health Maintenance  Topic Date Due  . PNA vac Low Risk Adult (1 of 2 - PCV13) Never done  . INFLUENZA VACCINE  06/17/2020  . TETANUS/TDAP  09/11/2020 (Originally 06/21/1965)  . Hepatitis C Screening  09/11/2020 (Originally 1946-02-09)  . COLONOSCOPY  11/17/2020  . COVID-19 Vaccine  Completed    Health Maintenance  Health  Maintenance Due  Topic Date Due  . PNA vac Low Risk Adult (1 of 2 - PCV13) Never done  . INFLUENZA VACCINE  06/17/2020    Colorectal cancer screening: Completed 2011. Repeat every 10 years  Lung Cancer Screening: (Low Dose CT Chest recommended if Age 62-80 years, 30 pack-year currently smoking OR have quit w/in 15years.) does qualify. Scheduled for 07/05/20.   Additional Screening:  Hepatitis C Screening: does qualify; postponed  Vision Screening: Recommended annual ophthalmology exams for early detection of glaucoma and other disorders of the eye. Is the patient up to date with their annual eye exam?  No  Who is the provider or what is the name of the office in which the patient attends annual eye exams? [facility] [address]  Dental Screening: Recommended annual dental exams for proper oral hygiene  Community Resource Referral / Chronic Care Management: CRR required this visit?  No   CCM required this visit?  No      Plan:     I have personally reviewed and noted the following in the patient's chart:   . Medical and social history . Use of alcohol, tobacco or illicit drugs  . Current medications and supplements . Functional ability and status . Nutritional status . Physical activity . Advanced directives . List of other physicians . Hospitalizations, surgeries, and ER visits in previous 12 months . Vitals . Screenings to include cognitive, depression, and falls . Referrals and appointments  In addition, I have reviewed and discussed with patient certain preventive protocols, quality metrics, and best practice recommendations. A written  personalized care plan for preventive services as well as general preventive health recommendations were provided to patient.    Due to this being a telephonic visit, the after visit summary with patients personalized plan was offered to patient via mail or my-chart. Patient declined at this time.  Reather Littler, LPN   [id number]   Nurse Notes: pt advised due for appt with Dr. Yetta Barre for yearly exam and labs. Scheduled for 07/17/20.

## 2020-06-27 NOTE — Patient Instructions (Signed)
[name] , Thank you for taking time to come for your Medicare Wellness Visit. I appreciate your ongoing commitment to your health goals. Please review the following plan we discussed and let me know if I can assist you in the future.   Screening recommendations/referrals: Colonoscopy: done 2011. Referral sent to [facility] for repeat screening colonoscopy.  Recommended yearly ophthalmology/optometry visit for glaucoma screening and checkup Recommended yearly dental visit for hygiene and checkup  Vaccinations: Influenza vaccine: declined Pneumococcal vaccine: declined Tdap vaccine: due Shingles vaccine: Shingrix discussed. Please contact your pharmacy for coverage information.  Covid-19: done 12/29/19 & 01/23/20  Advanced directives: Advance directive discussed with you today. I have provided a copy for you to complete at home and have notarized. Once this is complete please bring a copy in to our office so we can scan it into your chart.  Conditions/risks identified: Recommend drinking 6-8 glasses of water per day  Next appointment: Follow up in one year for your annual wellness visit.   Preventive Care 75 Years and Older, Male Preventive care refers to lifestyle choices and visits with your health care provider that can promote health and wellness. What does preventive care include?  A yearly physical exam. This is also called an annual well check.  Dental exams once or twice a year.  Routine eye exams. Ask your health care provider how often you should have your eyes checked.  Personal lifestyle choices, including:  Daily care of your teeth and gums.  Regular physical activity.  Eating a healthy diet.  Avoiding tobacco and drug use.  Limiting alcohol use.  Practicing safe sex.  Taking low doses of aspirin every day.  Taking vitamin and mineral supplements as recommended by your health care provider. What happens during an annual well check? The  services and screenings done by your health care provider during your annual well check will depend on your age, overall health, lifestyle risk factors, and family history of disease. Counseling  Your health care provider may ask you questions about your:  Alcohol use.  Tobacco use.  Drug use.  Emotional well-being.  Home and relationship well-being.  Sexual activity.  Eating habits.  History of falls.  Memory and ability to understand (cognition).  Work and work Astronomer. Screening  You may have the following tests or measurements:  Height, weight, and BMI.  Blood pressure.  Lipid and cholesterol levels. These may be checked every 5 years, or more frequently if you are over 62 years old.  Skin check.  Lung cancer screening. You may have this screening every year starting at age 57 if you have a 30-pack-year history of smoking and currently smoke or have quit within the past 15 years.  Fecal occult blood test (FOBT) of the stool. You may have this test every year starting at age 75.  Flexible sigmoidoscopy or colonoscopy. You may have a sigmoidoscopy every 5 years or a colonoscopy every 10 years starting at age 64.  Prostate cancer screening. Recommendations will vary depending on your family history and other risks.  Hepatitis C blood test.  Hepatitis B blood test.  Sexually transmitted disease (STD) testing.  Diabetes screening. This is done by checking your blood sugar (glucose) after you have not eaten for a while (fasting). You may have this done every 1-3 years.  Abdominal aortic aneurysm (AAA) screening. You may need this if you are a current or former smoker.  Osteoporosis. You may be screened starting at age 22 if you are  at high risk. Talk with your health care provider about your test results, treatment options, and if necessary, the need for more tests. Vaccines  Your health care provider may recommend certain vaccines, such as:  Influenza  vaccine. This is recommended every year.  Tetanus, diphtheria, and acellular pertussis (Tdap, Td) vaccine. You may need a Td booster every 10 years.  Zoster vaccine. You may need this after age 89.  Pneumococcal 13-valent conjugate (PCV13) vaccine. One dose is recommended after age 18.  Pneumococcal polysaccharide (PPSV23) vaccine. One dose is recommended after age 9. Talk to your health care provider about which screenings and vaccines you need and how often you need them. This information is not intended to replace advice given to you by your health care provider. Make sure you discuss any questions you have with your health care provider. Document Released: 11/30/2015 Document Revised: 07/23/2016 Document Reviewed: 09/04/2015 Elsevier Interactive Patient Education  2017 [employer] Prevention in the Home Falls can cause injuries. They can happen to people of all ages. There are many things you can do to make your home safe and to help prevent falls. What can I do on the outside of my home?  Regularly fix the edges of walkways and driveways and fix any cracks.  Remove anything that might make you trip as you walk through a door, such as a raised step or threshold.  Trim any bushes or trees on the path to your home.  Use bright outdoor lighting.  Clear any walking paths of anything that might make someone trip, such as rocks or tools.  Regularly check to see if handrails are loose or broken. Make sure that both sides of any steps have handrails.  Any raised decks and porches should have guardrails on the edges.  Have any leaves, snow, or ice cleared regularly.  Use sand or salt on walking paths during winter.  Clean up any spills in your garage right away. This includes oil or grease spills. What can I do in the bathroom?  Use night lights.  Install grab bars by the toilet and in the tub and shower. Do not use towel bars as grab bars.  Use non-skid mats or decals  in the tub or shower.  If you need to sit down in the shower, use a plastic, non-slip stool.  Keep the floor dry. Clean up any water that spills on the floor as soon as it happens.  Remove soap buildup in the tub or shower regularly.  Attach bath mats securely with double-sided non-slip rug tape.  Do not have throw rugs and other things on the floor that can make you trip. What can I do in the bedroom?  Use night lights.  Make sure that you have a light by your bed that is easy to reach.  Do not use any sheets or blankets that are too big for your bed. They should not hang down onto the floor.  Have a firm chair that has side arms. You can use this for support while you get dressed.  Do not have throw rugs and other things on the floor that can make you trip. What can I do in the kitchen?  Clean up any spills right away.  Avoid walking on wet floors.  Keep items that you use a lot in easy-to-reach places.  If you need to reach something above you, use a strong step stool that has a grab bar.  Keep electrical cords out of  the way.  Do not use floor polish or wax that makes floors slippery. If you must use wax, use non-skid floor wax.  Do not have throw rugs and other things on the floor that can make you trip. What can I do with my stairs?  Do not leave any items on the stairs.  Make sure that there are handrails on both sides of the stairs and use them. Fix handrails that are broken or loose. Make sure that handrails are as long as the stairways.  Check any carpeting to make sure that it is firmly attached to the stairs. Fix any carpet that is loose or worn.  Avoid having throw rugs at the top or bottom of the stairs. If you do have throw rugs, attach them to the floor with carpet tape.  Make sure that you have a light switch at the top of the stairs and the bottom of the stairs. If you do not have them, ask someone to add them for you. What else can I do to help  prevent falls?  Wear shoes that:  Do not have high heels.  Have rubber bottoms.  Are comfortable and fit you well.  Are closed at the toe. Do not wear sandals.  If you use a stepladder:  Make sure that it is fully opened. Do not climb a closed stepladder.  Make sure that both sides of the stepladder are locked into place.  Ask someone to hold it for you, if possible.  Clearly mark and make sure that you can see:  Any grab bars or handrails.  First and last steps.  Where the edge of each step is.  Use tools that help you move around (mobility aids) if they are needed. These include:  Canes.  Walkers.  Scooters.  Crutches.  Turn on the lights when you go into a dark area. Replace any light bulbs as soon as they burn out.  Set up your furniture so you have a clear path. Avoid moving your furniture around.  If any of your floors are uneven, fix them.  If there are any pets around you, be aware of where they are.  Review your medicines with your doctor. Some medicines can make you feel dizzy. This can increase your chance of falling. Ask your doctor what other things that you can do to help prevent falls. This information is not intended to replace advice given to you by your health care provider. Make sure you discuss any questions you have with your health care provider. Document Released: 08/30/2009 Document Revised: 04/10/2016 Document Reviewed: 12/08/2014 Elsevier Interactive Patient Education  2017 [employer].

## 2020-07-02 ENCOUNTER — Other Ambulatory Visit: Payer: Self-pay

## 2020-07-02 ENCOUNTER — Telehealth: Payer: Self-pay | Admitting: Family Medicine

## 2020-07-02 ENCOUNTER — Telehealth: Payer: Self-pay | Admitting: Internal Medicine

## 2020-07-02 DIAGNOSIS — Z1211 Encounter for screening for malignant neoplasm of colon: Secondary | ICD-10-CM

## 2020-07-02 NOTE — Telephone Encounter (Unsigned)
Copied from CRM [id number]. Topic: General - Inquiry >> Jul 02, 2020  9:03 AM Nathaniel Garcia wrote: Reason for CRM: patient is requesting a call back from PCP nurse regarding he got a letter in mail he got stating he needs a colonoscopy. Call back [phone]

## 2020-07-02 NOTE — Telephone Encounter (Signed)
Copied from CRM #[id number]. Topic: General - Inquiry >> Jul 02, 2020  9:03 AM Bonney, Mary Lauren wrote: Reason for CRM: patient is requesting a call back from PCP nurse regarding he got a letter in mail he got stating he needs a colonoscopy. Call back [phone] 

## 2020-07-02 NOTE — Telephone Encounter (Signed)
Called and spoke with the patient. He said he does not wish to continue to get colonoscopies. He said 3 or 4 years ago we sent him a cologuard and he wants to continue to do those instead of a colonoscopy.   Ordered cologuard for patient and informed him he will have to continue to do this every 3 years. 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

## 2020-07-03 ENCOUNTER — Telehealth: Payer: Medicare HMO

## 2020-07-03 ENCOUNTER — Telehealth: Payer: Self-pay

## 2020-07-03 NOTE — Telephone Encounter (Signed)
Contacted patient to discuss and schedule his colonoscopy.  Patient stated that he was going to have the cologuard test ordered.  I explained to patient that he should keep in mind if his cologuard test comes back positive he will need to have a colonoscopy-changing the colonoscopy from preventive to diagnostic colonoscopy meaning insurance may not cover the colonoscopy completely.  We also discussed the pros and con's of colonoscopy vs cologuard.  He appreciated this information, and said he will think about the colonoscopy some more.  I explained to him that he can call me back if he has any questions at all and gave him my direct phone number.  Thanks,  [place], [place]

## 2020-07-05 ENCOUNTER — Other Ambulatory Visit: Payer: Self-pay

## 2020-07-05 ENCOUNTER — Ambulatory Visit
Admission: RE | Admit: 2020-07-05 | Discharge: 2020-07-05 | Disposition: A | Discharge status: Home / Self Care | Payer: Medicare HMO | Source: Ambulatory Visit | Attending: Nurse Practitioner | Admitting: Nurse Practitioner

## 2020-07-05 DIAGNOSIS — Z87891 Personal history of nicotine dependence: Secondary | ICD-10-CM | POA: Diagnosis not present

## 2020-07-05 DIAGNOSIS — R69 Illness, unspecified: Secondary | ICD-10-CM | POA: Diagnosis not present

## 2020-07-05 DIAGNOSIS — Z122 Encounter for screening for malignant neoplasm of respiratory organs: Secondary | ICD-10-CM | POA: Insufficient documentation

## 2020-07-10 ENCOUNTER — Encounter: Payer: Self-pay | Admitting: *Deleted

## 2020-07-17 ENCOUNTER — Encounter: Payer: Self-pay | Admitting: Family Medicine

## 2020-07-17 ENCOUNTER — Other Ambulatory Visit: Payer: Self-pay

## 2020-07-17 ENCOUNTER — Ambulatory Visit (INDEPENDENT_AMBULATORY_CARE_PROVIDER_SITE_OTHER): Payer: Medicare HMO | Admitting: Family Medicine

## 2020-07-17 VITALS — BP 130/62 | HR 60 | Ht 73.0 in | Wt 124.0 lb

## 2020-07-17 DIAGNOSIS — F17210 Nicotine dependence, cigarettes, uncomplicated: Secondary | ICD-10-CM

## 2020-07-17 DIAGNOSIS — S91209S Unspecified open wound of unspecified toe(s) with damage to nail, sequela: Secondary | ICD-10-CM

## 2020-07-17 DIAGNOSIS — R69 Illness, unspecified: Secondary | ICD-10-CM | POA: Diagnosis not present

## 2020-07-17 MED ORDER — BUPROPION HCL ER (SMOKING DET) 150 MG PO TB12: 150.0000 mg | ORAL_TABLET | Freq: Two times a day (BID) | ORAL | 1 refills | Status: DC

## 2020-07-17 NOTE — Patient Instructions (Signed)
Coping with Quitting Smoking  Quitting smoking is a physical and mental challenge. You will face cravings, withdrawal symptoms, and temptation. Before quitting, work with your health care provider to make a plan that can help you cope. Preparation can help you quit and keep you from giving in. How can I cope with cravings? Cravings usually last for 5-10 minutes. If you get through it, the craving will pass. Consider taking the following actions to help you cope with cravings:  Keep your mouth busy: ? Chew sugar-free gum. ? Suck on hard candies or a straw. ? Brush your teeth.  Keep your hands and body busy: ? Immediately change to a different activity when you feel a craving. ? Squeeze or play with a ball. ? Do an activity or a hobby, like making bead jewelry, practicing needlepoint, or working with wood. ? Mix up your normal routine. ? Take a short exercise break. Go for a quick walk or run up and down stairs. ? Spend time in public places where smoking is not allowed.  Focus on doing something kind or helpful for someone else.  Call a friend or family member to talk during a craving.  Join a support group.  Call a quit line, such as 1-800-QUIT-NOW.  Talk with your health care provider about medicines that might help you cope with cravings and make quitting easier for you. How can I deal with withdrawal symptoms? Your body may experience negative effects as it tries to get used to not having nicotine in the system. These effects are called withdrawal symptoms. They may include:  Feeling hungrier than normal.  Trouble concentrating.  Irritability.  Trouble sleeping.  Feeling depressed.  Restlessness and agitation.  Craving a cigarette. To manage withdrawal symptoms:  Avoid places, people, and activities that trigger your cravings.  Remember why you want to quit.  Get plenty of sleep.  Avoid coffee and other caffeinated drinks. These may worsen some of your  symptoms. How can I handle social situations? Social situations can be difficult when you are quitting smoking, especially in the first few weeks. To manage this, you can:  Avoid parties, bars, and other social situations where people might be smoking.  Avoid alcohol.  Leave right away if you have the urge to smoke.  Explain to your family and friends that you are quitting smoking. Ask for understanding and support.  Plan activities with friends or family where smoking is not an option. What are some ways I can cope with stress? Wanting to smoke may cause stress, and stress can make you want to smoke. Find ways to manage your stress. Relaxation techniques can help. For example:  Breathe slowly and deeply, in through your nose and out through your mouth.  Listen to soothing, relaxing music.  Talk with a family member or friend about your stress.  Light a candle.  Soak in a bath or take a shower.  Think about a peaceful place. What are some ways I can prevent weight gain? Be aware that many people gain weight after they quit smoking. However, not everyone does. To keep from gaining weight, have a plan in place before you quit and stick to the plan after you quit. Your plan should include:  Having healthy snacks. When you have a craving, it may help to: ? Eat plain popcorn, crunchy carrots, celery, or other cut vegetables. ? Chew sugar-free gum.  Changing how you eat: ? Eat small portion sizes at meals. ? Eat 4-6 small meals   throughout the day instead of 1-2 large meals a day. ? Be mindful when you eat. Do not watch television or do other things that might distract you as you eat.  Exercising regularly: ? Make time to exercise each day. If you do not have time for a long workout, do short bouts of exercise for 5-10 minutes several times a day. ? Do some form of strengthening exercise, like weight lifting, and some form of aerobic exercise, like running or swimming.  Drinking  plenty of water or other low-calorie or no-calorie drinks. Drink 6-8 glasses of water daily, or as much as instructed by your health care provider. Summary  Quitting smoking is a physical and mental challenge. You will face cravings, withdrawal symptoms, and temptation to smoke again. Preparation can help you as you go through these challenges.  You can cope with cravings by keeping your mouth busy (such as by chewing gum), keeping your body and hands busy, and making calls to family, friends, or a helpline for people who want to quit smoking.  You can cope with withdrawal symptoms by avoiding places where people smoke, avoiding drinks with caffeine, and getting plenty of rest.  Ask your health care provider about the different ways to prevent weight gain, avoid stress, and handle social situations. This information is not intended to replace advice given to you by your health care provider. Make sure you discuss any questions you have with your health care provider. Document Revised: 10/16/2017 Document Reviewed: 10/31/2016 Elsevier Patient Education  2020 Elsevier Inc.  

## 2020-07-17 NOTE — Progress Notes (Signed)
Date:  07/17/2020   Name:  Nathaniel Garcia   DOB:  28-Oct-1946   MRN:  [id number]   Chief Complaint: avulsed toenail (L) toenail is pulling away from nailbed- has seen Dr Excell Seltzer in May for this) and Nicotine Dependence  Nicotine Dependence Presents for initial visit. Symptoms include cravings. Symptoms are negative for decreased concentration, fatigue, headache, insomnia, irritability and sore throat. Preferred tobacco types include cigarettes. Preferred cigarette types include filtered. His urge triggers include company of smokers, drinking alcohol, drinking coffee and meal time. Risk factors do not include contact with substance, decrease in perceived risk, disruptive behavior, driving, perceived media message about smoking or stress.He smokes < 1/2 a pack of cigarettes per day. Compliance with prior treatments has been good.    Lab Results  Component Value Date   CREATININE 1.22 05/30/2019   BUN 15 05/30/2019   NA 144 05/30/2019   K 5.5 (H) 05/30/2019   CL 105 05/30/2019   CO2 24 05/30/2019   Lab Results  Component Value Date   CHOL 177 06/29/2018   HDL 54 06/29/2018   LDLCALC 110 (H) 06/29/2018   TRIG 64 06/29/2018   CHOLHDL 3.3 06/29/2018   Lab Results  Component Value Date   TSH 1.650 05/30/2019   No results found for: HGBA1C Lab Results  Component Value Date   WBC 6.5 05/30/2019   HGB 14.9 05/30/2019   HCT 44.4 05/30/2019   MCV 96 05/30/2019   PLT 220 05/30/2019   Lab Results  Component Value Date   ALT 13 05/30/2019   AST 19 05/30/2019   ALKPHOS 72 05/30/2019   BILITOT 0.4 05/30/2019     Review of Systems  Constitutional: Negative for chills, fatigue, fever and irritability.  HENT: Negative for drooling, ear discharge, ear pain and sore throat.   Respiratory: Negative for cough, shortness of breath and wheezing.   Cardiovascular: Negative for chest pain, palpitations and leg swelling.  Gastrointestinal: Negative for abdominal pain, blood in  stool, constipation, diarrhea and nausea.  Endocrine: Negative for polydipsia.  Genitourinary: Negative for dysuria, frequency, hematuria and urgency.  Musculoskeletal: Negative for back pain, myalgias and neck pain.  Skin: Negative for rash.  Allergic/Immunologic: Negative for environmental allergies.  Neurological: Negative for dizziness and headaches.  Hematological: Does not bruise/bleed easily.  Psychiatric/Behavioral: Negative for decreased concentration and suicidal ideas. The patient is not nervous/anxious and does not have insomnia.     Patient Active Problem List   Diagnosis Date Noted  . Aortic atherosclerosis (HCC) 12/30/2018  . Benign hematuria 09/18/2016  . Weakness of left hand 07/02/2016  . Lipid screening 01/03/2016  . Cerebral vascular disease 09/14/2015  . Carotid artery narrowing 08/15/2015  . Bilateral carotid artery stenosis 08/15/2015  . Bradycardia 08/01/2015  . H/O transient cerebral ischemia 08/01/2015  . Combined fat and carbohydrate induced hyperlipemia 08/01/2015  . Breathlessness on exertion 07/30/2015  . Alcoholic (HCC) 04/03/2015  . Absolute anemia 04/03/2015  . 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  . Cerebral vascular accident (HCC) 04/03/2015  . HLD (hyperlipidemia) 04/03/2015  . Blood glucose elevated 04/03/2015  . Lung nodule, solitary 04/03/2015  . Non compliance with medical treatment 04/03/2015  . Lung nodule, multiple 04/03/2015  . Cerebral infarction (HCC) 04/03/2015  . Abnormal lung field 04/03/2015  . Alcohol dependence (HCC) 04/03/2015    No Known Allergies  Past Surgical History:  Procedure Laterality Date  . COLONOSCOPY  2011   normal- [facility] docs    Social History   Tobacco  Use  . Smoking status: Current Some Day Smoker    Packs/day: 0.02    Years: 60.00    Pack years: 1.20    Types: Cigarettes  . Smokeless tobacco: Never Used  . Tobacco comment: Stamps smoking cessation information provided  Vaping Use  .  Vaping Use: Never used  Substance Use Topics  . Alcohol use: Yes    Alcohol/week: 1.0 standard drink    Types: 1 Cans of beer per week  . Drug use: Yes    Comment: marijuana     Medication list has been reviewed and updated.  Current Meds  Medication Sig  . albuterol (VENTOLIN HFA) 108 (90 Base) MCG/ACT inhaler Inhale 2 puffs into the lungs every 6 (six) hours as needed for wheezing or shortness of breath.  Marland Kitchen atorvastatin (LIPITOR) 80 MG tablet Take 1 tablet (80 mg total) by mouth daily.  . clopidogrel (PLAVIX) 75 MG tablet Take 1 tablet (75 mg total) by mouth daily.  . tamsulosin (FLOMAX) 0.4 MG CAPS capsule Take 1 capsule (0.4 mg total) by mouth daily.  . TRELEGY ELLIPTA 100-62.5-25 MCG/INH AEPB Inhale 1 puff into the lungs daily.    PHQ 2/9 Scores 06/27/2020 05/31/2020 02/13/2020 08/29/2019  PHQ - 2 Score 0 0 0 0  PHQ- 9 Score - 0 0 -    GAD 7 : Generalized Anxiety Score 05/31/2020 02/13/2020  Nervous, Anxious, on Edge 0 0  Control/stop worrying 0 0  Worry too much - different things 0 0  Trouble relaxing 0 0  Restless 0 0  Easily annoyed or irritable 0 0  Afraid - awful might happen 0 0  Total GAD 7 Score 0 0    BP Readings from Last 3 Encounters:  07/17/20 130/62  05/31/20 (!) 138/58  05/14/20 (!) 149/79    Physical Exam HENT:     Head: Normocephalic.     Right Ear: Tympanic membrane and external ear normal.     Left Ear: Tympanic membrane and external ear normal. There is no impacted cerumen.     Nose: Nose normal.  Eyes:     General: No scleral icterus.       Right eye: No discharge.        Left eye: No discharge.     Conjunctiva/sclera: Conjunctivae normal.     Pupils: Pupils are equal, round, and reactive to light.  Neck:     Thyroid: No thyromegaly.     Vascular: No JVD.     Trachea: No tracheal deviation.  Cardiovascular:     Rate and Rhythm: Normal rate and regular rhythm.     Heart sounds: Normal heart sounds. No murmur heard.  No friction rub. No  gallop.   Pulmonary:     Effort: No respiratory distress.     Breath sounds: Normal breath sounds. No wheezing, rhonchi or rales.  Abdominal:     General: Bowel sounds are normal.     Palpations: Abdomen is soft. There is no mass.     Tenderness: There is no abdominal tenderness. There is no right CVA tenderness, left CVA tenderness, guarding or rebound.  Musculoskeletal:        General: No tenderness. Normal range of motion.     Cervical back: Normal range of motion and neck supple.  Lymphadenopathy:     Cervical: No cervical adenopathy.  Skin:    General: Skin is warm.     Capillary Refill: Capillary refill takes less than 2 seconds.  Findings: No rash.  Neurological:     Mental Status: He is alert and oriented to person, place, and time.     Cranial Nerves: No cranial nerve deficit.     Deep Tendon Reflexes: Reflexes are normal and symmetric.     Wt Readings from Last 3 Encounters:  07/17/20 124 lb (56.2 kg)  07/05/20 121 lb (54.9 kg)  05/31/20 121 lb (54.9 kg)    BP 130/62   Pulse 60   Ht 6\' 1"  (1.854 m)   Wt 124 lb (56.2 kg)   BMI 16.36 kg/m   Assessment and Plan: 1. Nail avulsion of toe, sequela Patient with a history of onychomycoses of the toenail which is previously been treated by Dr. .  Nail has become further involved and is now partially avulsed and probably needs to be removed.  Referral to Dr. Excell Seltzer is for next Tuesday at 11:00 and [place].  2. Cigarette nicotine dependence without complication Chronic.  Uncontrolled.  Information on smoking cessation has been given.  Patient originally was looking for Chantix but this is on recall and unable to be obtained.  So we will go with bupropion 150 mg twice a day.  Patient is also been given information on dealing with cravings which seems to be his primary driving force. - buPROPion (ZYBAN) 150 MG 12 hr tablet; Take 1 tablet (150 mg total) by mouth 2 (two) times daily.  Dispense: 60 tablet; Refill: 1

## 2020-07-20 DIAGNOSIS — L601 Onycholysis: Secondary | ICD-10-CM | POA: Diagnosis not present

## 2020-07-20 DIAGNOSIS — I739 Peripheral vascular disease, unspecified: Secondary | ICD-10-CM | POA: Diagnosis not present

## 2020-07-20 DIAGNOSIS — M79675 Pain in left toe(s): Secondary | ICD-10-CM | POA: Diagnosis not present

## 2020-07-20 DIAGNOSIS — S9032XD Contusion of left foot, subsequent encounter: Secondary | ICD-10-CM | POA: Diagnosis not present

## 2020-07-20 DIAGNOSIS — S90122D Contusion of left lesser toe(s) without damage to nail, subsequent encounter: Secondary | ICD-10-CM | POA: Diagnosis not present

## 2020-07-20 DIAGNOSIS — B351 Tinea unguium: Secondary | ICD-10-CM | POA: Diagnosis not present

## 2020-07-25 DIAGNOSIS — J449 Chronic obstructive pulmonary disease, unspecified: Secondary | ICD-10-CM | POA: Diagnosis not present

## 2020-07-25 DIAGNOSIS — R0600 Dyspnea, unspecified: Secondary | ICD-10-CM | POA: Diagnosis not present

## 2020-07-30 NOTE — Progress Notes (Signed)
07/31/2020 4:17 PM   Nathaniel Garcia Feb 22, 1946 [id number]  Referring provider: Duanne Limerick, MD [address] [place],  [place] [address]  Chief Complaint  Patient presents with  . Benign Prostatic Hypertrophy    HPI: 74 yo male with a history of hematuria and BPH with LU TS who presents today for follow up.    History of hematuria (high risk) Smoker.  CTU 09/2016 revealed adrenal glands are unremarkable. No urinary stones. Ureters are decompressed. No filling defects in the intrarenal collecting systems, ureters or bladder.Prostate is enlarged and slightly indents the bladder base. He underwent cystoscopy in January 2018 and no worrisome findings were discovered.  RUS on 08/12/2018 revealed right kidney is rather small with slight increase in renal echogenicity. Question renal artery stenosis as a potential etiology for these findings. In this regard, question whether patient is hypertensive. Note that similar changes of this nature are not noted on the left.  No obstructing focus in either kidney.  No renal mass evident.  Mildly prominent prostate with mild inhomogeneous echotexture within the prostate.  Cystoscopy with Dr. Richardo Garcia 07/2019 was NED.  No reports of gross hematuria  BPH WITH LUTS  (prostate and/or bladder) IPSS score: 3/0  PVR: 50 mL   Previous score: 7/1  Previous PVR: 55 mL  Major complaint(s): No complaints Garcia this visit.  Previous complaints abated with the restarting of the tamsulosin 0.4 mg.    Patient denies any modifying or aggravating factors.  Patient denies any gross hematuria, dysuria or suprapubic/flank pain.  Patient denies any fevers, chills, nausea or vomiting.   Currently taking: tamsulosin 0.4 mg daily.    IPSS    Row Name 07/31/20 1500         International Prostate Symptom Score   How often have you had the sensation of not emptying your bladder? Not Garcia All     How often have you had to urinate less than every two hours?  Not Garcia All     How often have you found you stopped and started again several times when you urinated? Not Garcia All     How often have you found it difficult to postpone urination? Less than 1 in 5 times     How often have you had a weak urinary stream? Less than 1 in 5 times     How often have you had to strain to start urination? Not Garcia All     How many times did you typically get up Garcia night to urinate? 1 Time     Total IPSS Score 3       Quality of Life due to urinary symptoms   If you were to spend the rest of your life with your urinary condition just the way it is now how would you feel about that? Delighted            Score:  1-7 Mild 8-19 Moderate 20-35 Severe   PMH: Past Medical History:  Diagnosis Date  . COPD (chronic obstructive pulmonary disease) (HCC)   . Hematuria   . Hyperlipidemia   . Stroke [facility])     Surgical History: Past Surgical History:  Procedure Laterality Date  . COLONOSCOPY  2011   normal- MD docs    Home Medications:  Allergies as of 07/31/2020   No Known Allergies     Medication List       Accurate as of July 31, 2020  4:17 PM. If you  have any questions, ask your nurse or doctor.        albuterol 108 (90 Base) MCG/ACT inhaler Commonly known as: VENTOLIN HFA Inhale 2 puffs into the lungs every 6 (six) hours as needed for wheezing or shortness of breath.   atorvastatin 80 MG tablet Commonly known as: LIPITOR Take 1 tablet (80 mg total) by mouth daily.   buPROPion 150 MG 12 hr tablet Commonly known as: ZYBAN Take 1 tablet (150 mg total) by mouth 2 (two) times daily.   clopidogrel 75 MG tablet Commonly known as: PLAVIX Take 1 tablet (75 mg total) by mouth daily.   tamsulosin 0.4 MG Caps capsule Commonly known as: FLOMAX Take 1 capsule (0.4 mg total) by mouth daily.   Trelegy Ellipta 100-62.5-25 MCG/INH Aepb Generic drug: Fluticasone-Umeclidin-Vilant Inhale 1 puff into the lungs daily.       Allergies: No Known  Allergies  Family History: Family History  Family history unknown: Yes    Social History:  reports that he has been smoking cigarettes. He has a 1.20 pack-year smoking history. He has never used smokeless tobacco. He reports current alcohol use of about 1.0 standard drink of alcohol per week. He reports current drug use.  ROS: For pertinent review of systems please refer to history of present illness  Physical Exam: BP (!) 168/71   Pulse 67   Ht 6\' 1"  (1.854 m)   Wt 121 lb 11.2 oz (55.2 kg)   BMI 16.06 kg/m   Constitutional:  Well nourished. Alert and oriented, No acute distress. HEENT: Nathaniel Garcia, mask in place.  Trachea midline Cardiovascular: No clubbing, cyanosis, or edema. Respiratory: Normal respiratory effort, no increased work of breathing. Neurologic: Grossly intact, no focal deficits, moving all 4 extremities. Psychiatric: Normal mood and affect.   Laboratory Data: PSA History  1.0 in 05/2014  0.9 in 02/2016  2.0 in 08/2016  0.82 in 05/2017  0.88 in 11/2017   1.05 in 07/2018  1.9 in 05/2019  Component     Latest Ref Rng & Units 03/14/2016 09/15/2016 05/30/2019  Prostate Specific Ag, Serum     0.0 - 4.0 ng/mL 0.9 2.0 1.9    Lab Results  Component Value Date   CREATININE 1.22 05/30/2019       Component Value Date/Time   CHOL 177 06/29/2018 1043   HDL 54 06/29/2018 1043   CHOLHDL 3.3 06/29/2018 1043   LDLCALC 110 (H) 06/29/2018 1043   Urinalysis Component     Latest Ref Rng & Units 05/14/2020  Color, Urine     YELLOW YELLOW  Appearance     CLEAR CLEAR  Specific Gravity, Urine     1.005 - 1.030 1.025  pH     5.0 - 8.0 7.0  Glucose, UA     NEGATIVE mg/dL NEGATIVE  Hgb urine dipstick     NEGATIVE TRACE (A)  Bilirubin Urine     NEGATIVE NEGATIVE  Ketones, ur     NEGATIVE mg/dL NEGATIVE  Protein     NEGATIVE mg/dL NEGATIVE  Nitrite     NEGATIVE NEGATIVE  Leukocytes,Ua     NEGATIVE NEGATIVE  Squamous Epithelial / LPF     0 - 5 0-5  WBC, UA      0 - 5 WBC/hpf NONE SEEN  RBC / HPF     0 - 5 RBC/hpf 0-5  Bacteria, UA     NONE SEEN NONE SEEN   I have reviewed the labs  Pertinent Imaging Results for Nathaniel Garcia,  Nathaniel VANDERVEER "FRED" (MRN [id number]) as of 07/31/2020 16:05  Ref. Range 07/31/2020 15:53  Scan Result Unknown 50    Assessment & Plan:    1. BPH with LUTS IPSS score is 3/0, it is improved Continue conservative management, avoiding bladder irritants and timed voiding's Continue tamsulosin 0.4 mg daily  RTC in one year for I PSS, PVR and exam   2. History of hematuria Hematuria work up completed in 07/2019 - findings positive for BPH No report of gross hematuria  UA today negative for heme  Patient to report any gross hematuria in the interim              Return in about 1 year (around 07/31/2021) for IPSS, PSA, PVR and exam.  These notes generated with voice recognition software. I apologize for typographical errors.  Michiel Cowboy, PA-C  [facility] [address] [place], [place] [address] [phone]

## 2020-07-31 ENCOUNTER — Other Ambulatory Visit: Payer: Self-pay

## 2020-07-31 ENCOUNTER — Ambulatory Visit (INDEPENDENT_AMBULATORY_CARE_PROVIDER_SITE_OTHER): Payer: Medicare HMO | Admitting: Urology

## 2020-07-31 ENCOUNTER — Encounter: Payer: Self-pay | Admitting: Urology

## 2020-07-31 VITALS — BP 168/71 | HR 67 | Ht 73.0 in | Wt 121.7 lb

## 2020-07-31 DIAGNOSIS — N401 Enlarged prostate with lower urinary tract symptoms: Secondary | ICD-10-CM | POA: Diagnosis not present

## 2020-07-31 DIAGNOSIS — N138 Other obstructive and reflux uropathy: Secondary | ICD-10-CM | POA: Diagnosis not present

## 2020-07-31 DIAGNOSIS — Z87448 Personal history of other diseases of urinary system: Secondary | ICD-10-CM

## 2020-07-31 LAB — BLADDER SCAN AMB NON-IMAGING: Scan Result: 50

## 2020-08-09 DIAGNOSIS — Z1211 Encounter for screening for malignant neoplasm of colon: Secondary | ICD-10-CM | POA: Diagnosis not present

## 2020-08-09 LAB — COLOGUARD

## 2020-08-13 LAB — COLOGUARD: COLOGUARD: NEGATIVE

## 2020-08-17 LAB — COLOGUARD: Cologuard: NEGATIVE

## 2020-08-21 DIAGNOSIS — L601 Onycholysis: Secondary | ICD-10-CM | POA: Diagnosis not present

## 2020-08-21 DIAGNOSIS — B351 Tinea unguium: Secondary | ICD-10-CM | POA: Diagnosis not present

## 2020-08-21 DIAGNOSIS — L03032 Cellulitis of left toe: Secondary | ICD-10-CM | POA: Diagnosis not present

## 2020-08-21 DIAGNOSIS — I739 Peripheral vascular disease, unspecified: Secondary | ICD-10-CM | POA: Diagnosis not present

## 2020-08-24 ENCOUNTER — Other Ambulatory Visit: Payer: Self-pay

## 2020-09-20 ENCOUNTER — Other Ambulatory Visit: Payer: Self-pay | Admitting: Family Medicine

## 2020-09-20 DIAGNOSIS — J301 Allergic rhinitis due to pollen: Secondary | ICD-10-CM

## 2020-09-20 DIAGNOSIS — J209 Acute bronchitis, unspecified: Secondary | ICD-10-CM

## 2020-09-20 NOTE — Telephone Encounter (Signed)
Requested medication (s) are due for refill today: yes  Requested medication (s) are on the active medication list: {No  Last refill: discontinued as completed course 02/13/20  Future visit scheduled: No  Notes to clinic:  Called and spoke with patient. He denies respiratory problems.  States he ran out and assumed he was suppose to take this medication. I told him it was discontinued and only ordered for an acute issue. I agreed to sent it to office for review. Please review and notify patient.    Requested Prescriptions  Pending Prescriptions Disp Refills   montelukast (SINGULAIR) 10 MG tablet [Pharmacy Med Name: MONTELUKAST 10MG  TABLETS] 30 tablet 3    Sig: TAKE 1 TABLET(10 MG) BY MOUTH AT BEDTIME      Pulmonology:  Leukotriene Inhibitors Passed - 09/20/2020  2:42 PM      Passed - Valid encounter within last 12 months    Recent Outpatient Visits           2 months ago Nail avulsion of toe, sequela   [facility] 13/02/2020, MD   3 months ago Cerebral vascular disease   [facility] Duanne Limerick, MD   7 months ago Ingrowing left great toenail   [facility] Duanne Limerick, MD   1 year ago Cervical radiculopathy   [facility] Duanne Limerick, MD   1 year ago Cervical radiculopathy   [facility] Duanne Limerick, MD       Future Appointments             In 10 months McGowan, Duanne Limerick [facility] [place]

## 2020-10-01 ENCOUNTER — Ambulatory Visit: Payer: Self-pay

## 2020-10-01 NOTE — Telephone Encounter (Signed)
Runny nose started 2 weeks ago. No H/A Fever, cough.  Color is clear. No SOB, and does not feel any different than baseline. Pt has emphysema. Pt denies wheezing, cough, chest pain. Due to sx and high risk pt-pt agreed to an appt for Covid testing. Testing scheduled for tomorrow at [facility]. Advised pt to qurantine before and after test until he knows his results- pt verbalized understanding.  Reason for Disposition . Common cold with no complications  Answer Assessment - Initial Assessment Questions 1. ONSET: "When did the nasal discharge start?"      3 weeks ago 2. AMOUNT: "How much discharge is there?"      Blowing nose 1 time an hour 3. COUGH: "Do you have a cough?" If yes, ask: "Describe the color of your sputum" (clear, white, yellow, green)     no 4. RESPIRATORY DISTRESS: "Describe your breathing."  No at baseline 5. FEVER: "Do you have a fever?" If Yes, ask: "What is your temperature, how was it measured, and when did it start?"     no 6. SEVERITY: "Overall, how bad are you feeling right now?" (e.g., doesn't interfere with normal activities, staying home from school/work, staying in bed)      Doesn't feel any different 7. OTHER SYMPTOMS: "Do you have any other symptoms?" (e.g., sore throat, earache, wheezing, vomiting)     no 8. PREGNANCY: "Is there any chance you are pregnant?" "When was your last menstrual period?"     n/a  Protocols used: COMMON COLD-A-AH

## 2020-10-02 ENCOUNTER — Other Ambulatory Visit: Payer: Medicare HMO

## 2020-10-02 DIAGNOSIS — Z20822 Contact with and (suspected) exposure to covid-19: Secondary | ICD-10-CM

## 2020-10-03 LAB — NOVEL CORONAVIRUS, NAA: SARS-CoV-2, NAA: NOT DETECTED

## 2020-10-03 LAB — SARS-COV-2, NAA 2 DAY TAT

## 2020-10-26 DIAGNOSIS — J449 Chronic obstructive pulmonary disease, unspecified: Secondary | ICD-10-CM | POA: Diagnosis not present

## 2020-10-26 DIAGNOSIS — R0600 Dyspnea, unspecified: Secondary | ICD-10-CM | POA: Diagnosis not present

## 2020-10-26 DIAGNOSIS — R059 Cough, unspecified: Secondary | ICD-10-CM | POA: Diagnosis not present

## 2020-10-29 ENCOUNTER — Ambulatory Visit (HOSPITAL_COMMUNITY)
Admission: EM | Admit: 2020-10-29 | Discharge: 2020-10-29 | Disposition: A | Discharge status: Home / Self Care | Payer: Medicare HMO

## 2020-10-29 ENCOUNTER — Encounter (HOSPITAL_COMMUNITY): Payer: Self-pay | Admitting: Internal Medicine

## 2020-10-29 ENCOUNTER — Observation Stay (HOSPITAL_COMMUNITY)
Admission: EM | Admit: 2020-10-29 | Discharge: 2020-10-31 | Disposition: A | Discharge status: Home / Self Care | Payer: Medicare HMO | Attending: Internal Medicine | Admitting: Internal Medicine

## 2020-10-29 ENCOUNTER — Other Ambulatory Visit: Payer: Self-pay

## 2020-10-29 ENCOUNTER — Emergency Department (HOSPITAL_COMMUNITY): Payer: Medicare HMO

## 2020-10-29 DIAGNOSIS — J441 Chronic obstructive pulmonary disease with (acute) exacerbation: Secondary | ICD-10-CM | POA: Diagnosis not present

## 2020-10-29 DIAGNOSIS — J439 Emphysema, unspecified: Secondary | ICD-10-CM | POA: Diagnosis not present

## 2020-10-29 DIAGNOSIS — R062 Wheezing: Secondary | ICD-10-CM | POA: Diagnosis not present

## 2020-10-29 DIAGNOSIS — J9601 Acute respiratory failure with hypoxia: Secondary | ICD-10-CM | POA: Diagnosis not present

## 2020-10-29 DIAGNOSIS — R0689 Other abnormalities of breathing: Secondary | ICD-10-CM | POA: Diagnosis not present

## 2020-10-29 DIAGNOSIS — Z20822 Contact with and (suspected) exposure to covid-19: Secondary | ICD-10-CM | POA: Insufficient documentation

## 2020-10-29 DIAGNOSIS — F17210 Nicotine dependence, cigarettes, uncomplicated: Secondary | ICD-10-CM | POA: Diagnosis not present

## 2020-10-29 DIAGNOSIS — J432 Centrilobular emphysema: Secondary | ICD-10-CM

## 2020-10-29 DIAGNOSIS — Z8673 Personal history of transient ischemic attack (TIA), and cerebral infarction without residual deficits: Secondary | ICD-10-CM | POA: Diagnosis not present

## 2020-10-29 DIAGNOSIS — R69 Illness, unspecified: Secondary | ICD-10-CM | POA: Diagnosis not present

## 2020-10-29 DIAGNOSIS — R0602 Shortness of breath: Secondary | ICD-10-CM | POA: Diagnosis not present

## 2020-10-29 DIAGNOSIS — I10 Essential (primary) hypertension: Secondary | ICD-10-CM | POA: Diagnosis not present

## 2020-10-29 LAB — COMPREHENSIVE METABOLIC PANEL
ALT: 9 U/L (ref 0–44)
AST: 21 U/L (ref 15–41)
Albumin: 3.6 g/dL (ref 3.5–5.0)
Alkaline Phosphatase: 60 U/L (ref 38–126)
Anion gap: 14 (ref 5–15)
BUN: 14 mg/dL (ref 8–23)
CO2: 18 mmol/L — ABNORMAL LOW (ref 22–32)
Calcium: 8.9 mg/dL (ref 8.9–10.3)
Chloride: 106 mmol/L (ref 98–111)
Creatinine, Ser: 1.11 mg/dL (ref 0.61–1.24)
GFR, Estimated: >60 mL/min (ref >60)
Glucose, Bld: 120 mg/dL — ABNORMAL HIGH (ref 70–99)
Potassium: 5.1 mmol/L (ref 3.5–5.1)
Sodium: 138 mmol/L (ref 135–145)
Total Bilirubin: 1.4 mg/dL — ABNORMAL HIGH (ref 0.3–1.2)
Total Protein: 6.6 g/dL (ref 6.5–8.1)

## 2020-10-29 LAB — I-STAT ARTERIAL BLOOD GAS, ED
Acid-base deficit: 2 mmol/L (ref 0.0–2.0)
Bicarbonate: 23.3 mmol/L (ref 20.0–28.0)
Calcium, Ion: 1.24 mmol/L (ref 1.15–1.40)
HCT: 43 % (ref 39.0–52.0)
Hemoglobin: 14.6 g/dL (ref 13.0–17.0)
O2 Saturation: 100 %
Patient temperature: 97.9
Potassium: 4 mmol/L (ref 3.5–5.1)
Sodium: 141 mmol/L (ref 135–145)
TCO2: 25 mmol/L (ref 22–32)
pCO2 arterial: 42.2 mmHg (ref 32.0–48.0)
pH, Arterial: 7.348 — ABNORMAL LOW (ref 7.350–7.450)
pO2, Arterial: 230 mmHg — ABNORMAL HIGH (ref 83.0–108.0)

## 2020-10-29 LAB — RESP PANEL BY RT-PCR (FLU A&B, COVID) ARPGX2
Influenza A by PCR: NEGATIVE
Influenza B by PCR: NEGATIVE
SARS Coronavirus 2 by RT PCR: NEGATIVE

## 2020-10-29 LAB — CBC
HCT: 42.2 % (ref 39.0–52.0)
Hemoglobin: 14.6 g/dL (ref 13.0–17.0)
MCH: 34.8 pg — ABNORMAL HIGH (ref 26.0–34.0)
MCHC: 34.6 g/dL (ref 30.0–36.0)
MCV: 100.7 fL — ABNORMAL HIGH (ref 80.0–100.0)
Platelets: 161 10*3/uL (ref 150–400)
RBC: 4.19 MIL/uL — ABNORMAL LOW (ref 4.22–5.81)
RDW: 13.2 % (ref 11.5–15.5)
WBC: 13.4 10*3/uL — ABNORMAL HIGH (ref 4.0–10.5)
nRBC: 0 % (ref 0.0–0.2)

## 2020-10-29 LAB — BRAIN NATRIURETIC PEPTIDE: B Natriuretic Peptide: 84.2 pg/mL (ref 0.0–100.0)

## 2020-10-29 MED ORDER — ATORVASTATIN CALCIUM 80 MG PO TABS
80.0000 mg | ORAL_TABLET | Freq: Every day | ORAL | Status: DC
  Administered 2020-10-30: 17:00:00 80 mg via ORAL
  Filled 2020-10-29: qty 1

## 2020-10-29 MED ORDER — MOMETASONE FURO-FORMOTEROL FUM 200-5 MCG/ACT IN AERO
2.0000 | INHALATION_SPRAY | Freq: Two times a day (BID) | RESPIRATORY_TRACT | Status: DC
  Administered 2020-10-30 – 2020-10-31 (×3): 2 via RESPIRATORY_TRACT
  Filled 2020-10-29: qty 8.8

## 2020-10-29 MED ORDER — ENOXAPARIN SODIUM 40 MG/0.4ML ~~LOC~~ SOLN
40.0000 mg | SUBCUTANEOUS | Status: DC
  Administered 2020-10-29: 40 mg via SUBCUTANEOUS
  Filled 2020-10-29 (×2): qty 0.4

## 2020-10-29 MED ORDER — UMECLIDINIUM BROMIDE 62.5 MCG/INH IN AEPB
1.0000 | INHALATION_SPRAY | Freq: Every day | RESPIRATORY_TRACT | Status: DC
  Administered 2020-10-31: 1 via RESPIRATORY_TRACT
  Filled 2020-10-29: qty 7

## 2020-10-29 MED ORDER — TAMSULOSIN HCL 0.4 MG PO CAPS
0.4000 mg | ORAL_CAPSULE | Freq: Every day | ORAL | Status: DC
  Administered 2020-10-30 – 2020-10-31 (×2): 0.4 mg via ORAL
  Filled 2020-10-29 (×2): qty 1

## 2020-10-29 MED ORDER — CEFTRIAXONE SODIUM 1 G IJ SOLR
1.0000 g | INTRAMUSCULAR | Status: DC
  Administered 2020-10-29 – 2020-10-30 (×2): 1 g via INTRAVENOUS
  Filled 2020-10-29: qty 1
  Filled 2020-10-29: qty 10

## 2020-10-29 MED ORDER — BUPROPION HCL ER (XL) 150 MG PO TB24
150.0000 mg | ORAL_TABLET | Freq: Every day | ORAL | Status: DC
  Administered 2020-10-30 – 2020-10-31 (×2): 150 mg via ORAL
  Filled 2020-10-29 (×2): qty 1

## 2020-10-29 MED ORDER — MONTELUKAST SODIUM 10 MG PO TABS
10.0000 mg | ORAL_TABLET | Freq: Every day | ORAL | Status: DC
  Administered 2020-10-30 (×2): 10 mg via ORAL
  Filled 2020-10-29 (×3): qty 1

## 2020-10-29 MED ORDER — PREDNISONE 20 MG PO TABS
40.0000 mg | ORAL_TABLET | Freq: Every day | ORAL | Status: DC
  Administered 2020-10-30: 09:00:00 40 mg via ORAL
  Filled 2020-10-29: qty 2

## 2020-10-29 MED ORDER — ALBUTEROL SULFATE HFA 108 (90 BASE) MCG/ACT IN AERS
8.0000 | INHALATION_SPRAY | Freq: Once | RESPIRATORY_TRACT | Status: AC
  Administered 2020-10-29: 8 via RESPIRATORY_TRACT
  Filled 2020-10-29: qty 6.7

## 2020-10-29 MED ORDER — METHYLPREDNISOLONE SODIUM SUCC 125 MG IJ SOLR
INTRAMUSCULAR | Status: AC
  Administered 2020-10-29: 125 mg via INTRAVENOUS
  Filled 2020-10-29: qty 2

## 2020-10-29 MED ORDER — ALBUTEROL SULFATE (2.5 MG/3ML) 0.083% IN NEBU
2.5000 mg | INHALATION_SOLUTION | RESPIRATORY_TRACT | Status: DC | PRN
  Administered 2020-10-29: 23:00:00 2.5 mg via RESPIRATORY_TRACT
  Filled 2020-10-29: qty 3

## 2020-10-29 MED ORDER — METHYLPREDNISOLONE SODIUM SUCC 125 MG IJ SOLR: 125.0000 mg | Freq: Once | INTRAMUSCULAR | Status: AC

## 2020-10-29 MED ORDER — CLOPIDOGREL BISULFATE 75 MG PO TABS
75.0000 mg | ORAL_TABLET | Freq: Every day | ORAL | Status: DC
  Administered 2020-10-30 – 2020-10-31 (×2): 75 mg via ORAL
  Filled 2020-10-29 (×2): qty 1

## 2020-10-29 MED ORDER — ALBUTEROL SULFATE (2.5 MG/3ML) 0.083% IN NEBU
2.5000 mg | INHALATION_SOLUTION | RESPIRATORY_TRACT | Status: DC | PRN
  Administered 2020-10-30: 03:00:00 2.5 mg via RESPIRATORY_TRACT
  Filled 2020-10-29: qty 3

## 2020-10-29 NOTE — ED Triage Notes (Signed)
Attempted to call [facility]  To give report on Pt coming via EMS.

## 2020-10-29 NOTE — ED Notes (Signed)
Pt refused blood work  

## 2020-10-29 NOTE — ED Triage Notes (Signed)
PT reports SHOB when walking up the stairs at his house . Pt has called his PCP but can not be seen.

## 2020-10-29 NOTE — H&P (Signed)
History and Physical    Nathaniel Garcia:[id number] DOB: 10-05-1946 DOA: 10/29/2020  PCP: Duanne Limerick, MD  Patient coming from: Home  I have personally briefly reviewed patient's old medical records in [facility]  Chief Complaint: SOB  HPI: Nathaniel Garcia is a 74 y.o. male with medical history significant of COPD, stroke.  Pt still smoking.  Pt presents to ED with gradual onset of SOB over the past 2 weeks.  Progressively worsening, severe today.  Symptoms worse with exertion, nothing makes them better.  Symptoms are constant.  No CP, no fever nor cough, no abd pain, no leg swellnig.   ED Course: CXR just shows emphysema, COVID neg.    Pt satting 83% on 4L via [place] initially, pt put on rescue BIPAP and now satting 100%.  Symptoms persist despite multiple neb treatments, steroids.   Review of Systems: As per HPI, otherwise all review of systems negative.  Past Medical History:  Diagnosis Date  . COPD (chronic obstructive pulmonary disease) (HCC)   . Hematuria   . Hyperlipidemia   . Stroke [facility])     Past Surgical History:  Procedure Laterality Date  . COLONOSCOPY  2011   normal- MD docs     reports that he has been smoking cigarettes. He has a 1.20 pack-year smoking history. He has never used smokeless tobacco. He reports current alcohol use of about 1.0 standard drink of alcohol per week. He reports current drug use.  No Known Allergies  Family History  Problem Relation Age of Onset  . Prostate cancer Neg Hx      Prior to Admission medications   Medication Sig Start Date End Date Taking? Authorizing Provider  albuterol (VENTOLIN HFA) 108 (90 Base) MCG/ACT inhaler Inhale 2 puffs into the lungs every 6 (six) hours as needed for wheezing or shortness of breath. 05/31/20  Yes Duanne Limerick, MD  atorvastatin (LIPITOR) 80 MG tablet Take 1 tablet (80 mg total) by mouth daily. 05/31/20  Yes Duanne Limerick, MD  buPROPion (WELLBUTRIN XL) 150  MG 24 hr tablet Take 150 mg by mouth daily. 10/26/20 10/26/21 Yes [provider]  clopidogrel (PLAVIX) 75 MG tablet Take 1 tablet (75 mg total) by mouth daily. 05/31/20  Yes Duanne Limerick, MD  montelukast (SINGULAIR) 10 MG tablet Take 10 mg by mouth at bedtime. 07/31/20  Yes [provider]  tamsulosin (FLOMAX) 0.4 MG CAPS capsule Take 1 capsule (0.4 mg total) by mouth daily. 02/13/20  Yes McGowan, Shannon A, PA-C  TRELEGY ELLIPTA 100-62.5-25 MCG/INH AEPB Inhale 1 puff into the lungs daily. 05/31/20  Yes Duanne Limerick, MD  buPROPion (ZYBAN) 150 MG 12 hr tablet Take 1 tablet (150 mg total) by mouth 2 (two) times daily. Patient not taking: No sig reported 07/17/20   Duanne Limerick, MD    Physical Exam: Vitals:   10/29/20 2210 10/29/20 2300 10/29/20 2309 10/29/20 2320  BP: (!) 150/66 (!) 146/60  (!) 152/75  Pulse: 84 81  87  Resp: 17 17  18   Temp:      TempSrc:      SpO2: 99% 100% 100% 100%  Weight:      Height:        Constitutional: NAD, calm, comfortable Eyes: PERRL, lids and conjunctivae normal ENMT: Mucous membranes are moist. Posterior pharynx clear of any exudate or lesions.Normal dentition.  Neck: normal, supple, no masses, no thyromegaly Respiratory: Diffuse wheezes Cardiovascular: Regular rate and rhythm, no murmurs /  rubs / gallops. No extremity edema. 2+ pedal pulses. No carotid bruits.  Abdomen: no tenderness, no masses palpated. No hepatosplenomegaly. Bowel sounds positive.  Musculoskeletal: no clubbing / cyanosis. No joint deformity upper and lower extremities. Good ROM, no contractures. Normal muscle tone.  Skin: no rashes, lesions, ulcers. No induration Neurologic: CN 2-12 grossly intact. Sensation intact, DTR normal. Strength 5/5 in all 4.  Psychiatric: Normal judgment and insight. Alert and oriented x 3. Normal mood.    Labs on Admission: I have personally reviewed following labs and imaging studies  CBC: Recent Labs  Lab 10/29/20 2221  10/29/20 2327  WBC 13.4*  --   HGB 14.6 14.6  HCT 42.2 43.0  MCV 100.7*  --   PLT 161  --    Basic Metabolic Panel: Recent Labs  Lab 10/29/20 2221 10/29/20 2327  NA 138 141  K 5.1 4.0  CL 106  --   CO2 18*  --   GLUCOSE 120*  --   BUN 14  --   CREATININE 1.11  --   CALCIUM 8.9  --    GFR: Estimated Creatinine Clearance: 44.9 mL/min (by C-G formula based on SCr of 1.11 mg/dL). Liver Function Tests: Recent Labs  Lab 10/29/20 2221  AST 21  ALT 9  ALKPHOS 60  BILITOT 1.4*  PROT 6.6  ALBUMIN 3.6   No results for input(s): LIPASE, AMYLASE in the last 168 hours. No results for input(s): AMMONIA in the last 168 hours. Coagulation Profile: No results for input(s): INR, PROTIME in the last 168 hours. Cardiac Enzymes: No results for input(s): CKTOTAL, CKMB, CKMBINDEX, TROPONINI in the last 168 hours. BNP (last 3 results) No results for input(s): PROBNP in the last 8760 hours. HbA1C: No results for input(s): HGBA1C in the last 72 hours. CBG: No results for input(s): GLUCAP in the last 168 hours. Lipid Profile: No results for input(s): CHOL, HDL, LDLCALC, TRIG, CHOLHDL, LDLDIRECT in the last 72 hours. Thyroid Function Tests: No results for input(s): TSH, T4TOTAL, FREET4, T3FREE, THYROIDAB in the last 72 hours. Anemia Panel: No results for input(s): VITAMINB12, FOLATE, FERRITIN, TIBC, IRON, RETICCTPCT in the last 72 hours. Urine analysis:    Component Value Date/Time   COLORURINE YELLOW 05/14/2020 1043   APPEARANCEUR CLEAR 05/14/2020 1043   APPEARANCEUR Clear 07/28/2019 1558   LABSPEC 1.025 05/14/2020 1043   PHURINE 7.0 05/14/2020 1043   GLUCOSEU NEGATIVE 05/14/2020 1043   HGBUR TRACE (A) 05/14/2020 1043   BILIRUBINUR NEGATIVE 05/14/2020 1043   BILIRUBINUR Negative 07/28/2019 1558   KETONESUR NEGATIVE 05/14/2020 1043   PROTEINUR NEGATIVE 05/14/2020 1043   UROBILINOGEN 0.2 09/15/2016 1008   NITRITE NEGATIVE 05/14/2020 1043   LEUKOCYTESUR NEGATIVE 05/14/2020 1043     Radiological Exams on Admission: DG Chest 2 View  Result Date: 10/29/2020 CLINICAL DATA:  Short of breath EXAM: CHEST - 2 VIEW COMPARISON:  CT 07/05/2020 FINDINGS: Hyperinflation with emphysematous disease. Scarring at the apical left lung. No acute consolidation or pleural effusion. Normal cardiomediastinal silhouette. No pneumothorax IMPRESSION: No active cardiopulmonary disease. Hyperinflation with emphysematous disease. Electronically Signed   By: Jasmine Pang M.D.   On: 10/29/2020 17:45    EKG: Independently reviewed.  Assessment/Plan Principal Problem:   COPD exacerbation (HCC) Active Problems:   Acute respiratory failure with hypoxia (HCC)   History of stroke    1. COPD exacerbation - causing acute hypoxic resp failure requiring rescue BIPAP 1. Cont pulse ox 2. Tele monitor 3. COPD pathway 4. Rocephin 5. Scheduled LABA, LAMA,  INH steroid 6. PO prednisone 7. PRN albuterol 8. Really needs to quit smoking! 1. Cont welbutrin 2. H/o stroke - 1. Cont plavix  DVT prophylaxis: Lovenox Code Status: Full Family Communication: No family in room Disposition Plan: Home after admit Consults called: None Admission status: Admit to inpatient  Severity of Illness: The appropriate patient status for this patient is INPATIENT. Inpatient status is judged to be reasonable and necessary in order to provide the required intensity of service to ensure the patient's safety. The patient's presenting symptoms, physical exam findings, and initial radiographic and laboratory data in the context of their chronic comorbidities is felt to place them at high risk for further clinical deterioration. Furthermore, it is not anticipated that the patient will be medically stable for discharge from the hospital within 2 midnights of admission. The following factors support the patient status of inpatient.   IP status due to acute hypoxic respiratory failure requiring oxygen and rescue BIPAP.   * I  certify that at the point of admission it is my clinical judgment that the patient will require inpatient hospital care spanning beyond 2 midnights from the point of admission due to high intensity of service, high risk for further deterioration and high frequency of surveillance required.*    Mabry Tift M. DO [facility]  How to contact the [facility] Attending or Consulting provider 7A - 7P or covering provider during after hours 7P -7A, for this patient?  1. Check the care team in [facility] and look for a) attending/consulting [facility] provider listed and b) the [facility] team listed 2. Log into [email or web address]  Amion Physician Scheduling and messaging for groups and whole hospitals  On call and physician scheduling software for group practices, residents, hospitalists and other medical providers for call, clinic, rotation and shift schedules. On[employer] is a hospital-wide system for scheduling doctors and paging doctors on call. EasyPlot is for scientific plotting and data analysis.  [email or web address]  and use [place]'s universal password to access. If you do not have the password, please contact the hospital operator.  3. Locate the [facility] provider you are looking for under [facility] and page to a number that you can be directly reached. 4. If you still have difficulty reaching the provider, please page the [employer] (Director on Call) for the Hospitalists listed on amion for assistance.  10/29/2020, 11:35 PM

## 2020-10-29 NOTE — ED Triage Notes (Signed)
Pt here from [facility] with hx of COPD here for exertional shob x 2 weeks and then wheezing over the last two days. UC reports O2 sats 85% room air. Albuterol neb given by EMS.

## 2020-10-29 NOTE — ED Triage Notes (Signed)
Pt 83% on 4 liters of nasal O2 . Pt placed on non re breather 10 litera SPO2 100%. Pt reports a Hx of COPD with increased SHOB.Marland Kitchen

## 2020-10-29 NOTE — ED Provider Notes (Signed)
[facility]ER[facility]PT [facility] Emergency Department Provider Note MRN:  [id number]  Arrival date & time: 10/29/20     Chief Complaint   Shortness of Breath   History of Present Illness   Nathaniel Garcia is a 74 y.o. year-old male with a history of COPD, stroke presenting to the ED with chief complaint of shortness of breath.  Gradual onset shortness of breath worsening over the past 2 weeks, much worse today.  Denies chest pain.  Denies fever or cough.  No abdominal pain, no leg pain or swelling, no other complaints.  Symptoms are moderate to severe, constant, worse with exertion.  Review of Systems  A complete 10 system review of systems was obtained and all systems are negative except as noted in the HPI and PMH.   Patient's Health History    Past Medical History:  Diagnosis Date  . COPD (chronic obstructive pulmonary disease) (HCC)   . Hematuria   . Hyperlipidemia   . Stroke [facility])     Past Surgical History:  Procedure Laterality Date  . COLONOSCOPY  2011   normal- MD docs    Family History  Family history unknown: Yes    Social History   Socioeconomic History  . Marital status: Legally Separated    Spouse name: Not on file  . Number of children: 3  . Years of education: some college  . Highest education level: 12th grade  Occupational History  . Occupation: Retired  Tobacco Use  . Smoking status: Current Some Day Smoker    Packs/day: 0.02    Years: 60.00    Pack years: 1.20    Types: Cigarettes  . Smokeless tobacco: Never Used  . Tobacco comment: [place] smoking cessation information provided  Vaping Use  . Vaping Use: Never used  Substance and Sexual Activity  . Alcohol use: Yes    Alcohol/week: 1.0 standard drink    Types: 1 Cans of beer per week  . Drug use: Yes    Comment: marijuana  . Sexual activity: Never  Other Topics Concern  . Not on file  Social History Narrative   Pt lives alone.   Social Determinants of Health    Financial Resource Strain: Low Risk   . Difficulty of Paying Living Expenses: Not hard at all  Food Insecurity: No Food Insecurity  . Worried About Programme researcher, broadcasting/film/video in the Last Year: Never true  . Ran Out of Food in the Last Year: Never true  Transportation Needs: No Transportation Needs  . Lack of Transportation (Medical): No  . Lack of Transportation (Non-Medical): No  Physical Activity: Inactive  . Days of Exercise per Week: 0 days  . Minutes of Exercise per Session: 0 min  Stress: No Stress Concern Present  . Feeling of Stress : Not at all  Social Connections: Socially Isolated  . Frequency of Communication with Friends and Family: More than three times a week  . Frequency of Social Gatherings with Friends and Family: Three times a week  . Attends Religious Services: Never  . Active Member of Clubs or Organizations: No  . Attends Banker Meetings: Never  . Marital Status: Separated  Intimate Partner Violence: Not At Risk  . Fear of Current or Ex-Partner: No  . Emotionally Abused: No  . Physically Abused: No  . Sexually Abused: No     Physical Exam   Vitals:   10/29/20 2210 10/29/20 2300  BP: (!) 150/66 (!) 146/60  Pulse: 84 81  Resp: 17 17  Temp:    SpO2: 99% 100%    CONSTITUTIONAL: Chronically ill-appearing, NAD NEURO:  Alert and oriented x 3, no focal deficits EYES:  eyes equal and reactive ENT/NECK:  no LAD, no JVD CARDIO: Regular rate, well-perfused, normal S1 and S2 PULM: Diffuse wheezing, accessory muscle use, pursed lip breathing, tachypnea GI/GU:  normal bowel sounds, non-distended, non-tender MSK/SPINE:  No gross deformities, no edema SKIN:  no rash, atraumatic PSYCH:  Appropriate speech and behavior  *Additional and/or pertinent findings included in MDM below  Diagnostic and Interventional Summary    EKG Interpretation  Date/Time:  Monday October 29 2020 17:09:21 EST Ventricular Rate:  79 PR Interval:  118 QRS  Duration: 76 QT Interval:  396 QTC Calculation: 454 R Axis:   87 Text Interpretation: Normal sinus rhythm Biatrial enlargement Nonspecific ST abnormality Abnormal ECG Confirmed by Kennis Carina [phone]) on 10/29/2020 9:11:32 PM      Labs Reviewed  CBC - Abnormal; Notable for the following components:      Result Value   WBC 13.4 (*)    RBC 4.19 (*)    MCV 100.7 (*)    MCH 34.8 (*)    All other components within normal limits  RESP PANEL BY RT-PCR (FLU A&B, COVID) [id number]  COMPREHENSIVE METABOLIC PANEL  BRAIN NATRIURETIC PEPTIDE  BLOOD GAS, ARTERIAL    DG Chest 2 View  Final Result      Medications  albuterol (PROVENTIL) (2.5 MG/3ML) 0.083% nebulizer solution 2.5 mg (has no administration in time range)  methylPREDNISolone sodium succinate (SOLU-MEDROL) 125 mg/2 mL injection 125 mg (125 mg Intravenous Given 10/29/20 2054)  albuterol (VENTOLIN HFA) 108 (90 Base) MCG/ACT inhaler 8 puff (8 puffs Inhalation Given 10/29/20 2121)     Procedures  /  Critical Care .Critical Care Performed by: Sabas Sous, MD Authorized by: Sabas Sous, MD   Critical care provider statement:    Critical care time (minutes):  35   Critical care was necessary to treat or prevent imminent or life-threatening deterioration of the following conditions:  Respiratory failure   Critical care was time spent personally by me on the following activities:  Discussions with consultants, evaluation of patient's response to treatment, examination of patient, ordering and performing treatments and interventions, ordering and review of laboratory studies, ordering and review of radiographic studies, pulse oximetry, re-evaluation of patient's condition, obtaining history from patient or surrogate and review of old charts    ED Course and Medical Decision Making  I have reviewed the triage vital signs, the nursing notes, and pertinent available records from the EMR.  Listed above are laboratory and imaging  tests that I personally ordered, reviewed, and interpreted and then considered in my medical decision making (see below for details).  Suspect COPD exacerbation, providing BiPAP, albuterol, Solu-Medrol.     Patient improved on BiPAP, will admit to medicine  Elmer Sow. Pilar Plate, MD [facility] Emergency Medicine [facility] [email or web address]  Final Clinical Impressions(s) / ED Diagnoses     ICD-10-CM   1. COPD exacerbation (HCC)  J44.1   2. Acute respiratory failure with hypoxia (HCC)  J96.01     ED Discharge Orders    None       Discharge Instructions Discussed with and Provided to Patient:   Discharge Instructions   None       Sabas Sous, MD 10/29/20 2308

## 2020-10-30 ENCOUNTER — Encounter (HOSPITAL_COMMUNITY): Payer: Self-pay | Admitting: Internal Medicine

## 2020-10-30 DIAGNOSIS — J441 Chronic obstructive pulmonary disease with (acute) exacerbation: Secondary | ICD-10-CM | POA: Diagnosis not present

## 2020-10-30 LAB — CBC
HCT: 42.4 % (ref 39.0–52.0)
Hemoglobin: 14.2 g/dL (ref 13.0–17.0)
MCH: 34.1 pg — ABNORMAL HIGH (ref 26.0–34.0)
MCHC: 33.5 g/dL (ref 30.0–36.0)
MCV: 101.7 fL — ABNORMAL HIGH (ref 80.0–100.0)
Platelets: 204 10*3/uL (ref 150–400)
RBC: 4.17 MIL/uL — ABNORMAL LOW (ref 4.22–5.81)
RDW: 13.2 % (ref 11.5–15.5)
WBC: 6.4 10*3/uL (ref 4.0–10.5)
nRBC: 0 % (ref 0.0–0.2)

## 2020-10-30 LAB — BASIC METABOLIC PANEL
Anion gap: 13 (ref 5–15)
BUN: 14 mg/dL (ref 8–23)
CO2: 22 mmol/L (ref 22–32)
Calcium: 9.4 mg/dL (ref 8.9–10.3)
Chloride: 104 mmol/L (ref 98–111)
Creatinine, Ser: 1.17 mg/dL (ref 0.61–1.24)
GFR, Estimated: >60 mL/min (ref >60)
Glucose, Bld: 175 mg/dL — ABNORMAL HIGH (ref 70–99)
Potassium: 4.2 mmol/L (ref 3.5–5.1)
Sodium: 139 mmol/L (ref 135–145)

## 2020-10-30 LAB — HIV ANTIBODY (ROUTINE TESTING W REFLEX): HIV Screen 4th Generation wRfx: NONREACTIVE

## 2020-10-30 LAB — GLUCOSE, CAPILLARY: Glucose-Capillary: 156 mg/dL — ABNORMAL HIGH (ref 70–99)

## 2020-10-30 MED ORDER — NICOTINE 14 MG/24HR TD PT24
14.0000 mg | MEDICATED_PATCH | Freq: Every day | TRANSDERMAL | Status: DC
  Administered 2020-10-31: 14 mg via TRANSDERMAL
  Filled 2020-10-30: qty 1

## 2020-10-30 MED ORDER — MELATONIN 5 MG PO TABS
5.0000 mg | ORAL_TABLET | Freq: Every evening | ORAL | Status: DC | PRN
  Filled 2020-10-30: qty 1

## 2020-10-30 MED ORDER — METHYLPREDNISOLONE SODIUM SUCC 125 MG IJ SOLR
60.0000 mg | Freq: Two times a day (BID) | INTRAMUSCULAR | Status: DC
  Administered 2020-10-30 – 2020-10-31 (×3): 60 mg via INTRAVENOUS
  Filled 2020-10-30 (×3): qty 2

## 2020-10-30 NOTE — Progress Notes (Signed)
Pt is currently stable and not short of breath at this time so he doesn't need BiPAP. Rt will continue to monitor.

## 2020-10-30 NOTE — Progress Notes (Signed)
PROGRESS NOTE    Nathaniel Garcia  YQI:[id number] DOB: 1946-02-02 DOA: 10/29/2020 PCP: Duanne Limerick, MD  Brief Narrative: 74 year old male with history of COPD, ongoing tobacco abuse, history of CVA presented to the ED with worsening shortness of breath for a week, he reported cough wheezing and shortness of breath, acutely worsened the day of admission. -In the ED he was noted to be hypoxic with O2 sats of 83% on 4 L, noted to be tachypneic and placed on BiPAP, chest x-ray noted emphysema, Covid PCR was negative   Assessment & Plan:   Acute hypoxic respiratory failure COPD exacerbation -Clinically improving, off BiPAP  -chest x-ray noted emphysema otherwise unremarkable, Covid PCR is negative -Continue IV ceftriaxone  -Continue Solu-Medrol 60 mg every 12 today, transition to prednisone tomorrow -Continue duo nebs, Mucinex -Wean off O2  Tobacco abuse -Counseled, nicotine patch  History of CVA -Continue Plavix  DVT prophylaxis: Lovenox Code Status: Full code Family Communication: Discussed patient and sister at bedside Disposition Plan:  Status is: Inpatient  Remains inpatient appropriate because:Inpatient level of care appropriate due to severity of illness   Dispo: The patient is from: Home              Anticipated d/c is to: Home              Anticipated d/c date is: 1 day              Patient currently is not medically stable to d/c.  Procedures:   Antimicrobials:    Subjective: -Breathing better today, some cough and wheezing but overall significantly better  Objective: Vitals:   10/30/20 1150 10/30/20 1200 10/30/20 1300 10/30/20 1330  BP: (!) 144/66 139/63 (!) 150/78 (!) 151/63  Pulse: 68 66 84 75  Resp: (!) 23 (!) 21 (!) 22 (!) 30  Temp:      TempSrc:      SpO2: 97% 97% 94% 100%  Weight:      Height:       No intake or output data in the 24 hours ending 10/30/20 1402 Filed Weights   10/29/20 2047  Weight: 55.2 kg     Examination:  General exam: Elderly thinly built male sitting up in bed, AAOx3, no distress Respiratory system: Poor air movement bilaterally, few scattered wheezes. Cardiovascular system: S1 & S2 heard, RRR  Gastrointestinal system: Abdomen is nondistended, soft and nontender.Normal bowel sounds heard. Central nervous system: Alert and oriented. No focal neurological deficits. Extremities: No edema Skin: No rashes, lesions or ulcers Psychiatry: Mood & affect appropriate.     Data Reviewed:   CBC: Recent Labs  Lab 10/29/20 2221 10/29/20 2327 10/30/20 0335  WBC 13.4*  --  6.4  HGB 14.6 14.6 14.2  HCT 42.2 43.0 42.4  MCV 100.7*  --  101.7*  PLT 161  --  204   Basic Metabolic Panel: Recent Labs  Lab 10/29/20 2221 10/29/20 2327 10/30/20 0335  NA 138 141 139  K 5.1 4.0 4.2  CL 106  --  104  CO2 18*  --  22  GLUCOSE 120*  --  175*  BUN 14  --  14  CREATININE 1.11  --  1.17  CALCIUM 8.9  --  9.4   GFR: Estimated Creatinine Clearance: 42.6 mL/min (by C-G formula based on SCr of 1.17 mg/dL). Liver Function Tests: Recent Labs  Lab 10/29/20 2221  AST 21  ALT 9  ALKPHOS 60  BILITOT 1.4*  PROT 6.6  ALBUMIN  3.6   No results for input(s): LIPASE, AMYLASE in the last 168 hours. No results for input(s): AMMONIA in the last 168 hours. Coagulation Profile: No results for input(s): INR, PROTIME in the last 168 hours. Cardiac Enzymes: No results for input(s): CKTOTAL, CKMB, CKMBINDEX, TROPONINI in the last 168 hours. BNP (last 3 results) No results for input(s): PROBNP in the last 8760 hours. HbA1C: No results for input(s): HGBA1C in the last 72 hours. CBG: No results for input(s): GLUCAP in the last 168 hours. Lipid Profile: No results for input(s): CHOL, HDL, LDLCALC, TRIG, CHOLHDL, LDLDIRECT in the last 72 hours. Thyroid Function Tests: No results for input(s): TSH, T4TOTAL, FREET4, T3FREE, THYROIDAB in the last 72 hours. Anemia Panel: No results for  input(s): VITAMINB12, FOLATE, FERRITIN, TIBC, IRON, RETICCTPCT in the last 72 hours. Urine analysis:    Component Value Date/Time   COLORURINE YELLOW 05/14/2020 1043   APPEARANCEUR CLEAR 05/14/2020 1043   APPEARANCEUR Clear 07/28/2019 1558   LABSPEC 1.025 05/14/2020 1043   PHURINE 7.0 05/14/2020 1043   GLUCOSEU NEGATIVE 05/14/2020 1043   HGBUR TRACE (A) 05/14/2020 1043   BILIRUBINUR NEGATIVE 05/14/2020 1043   BILIRUBINUR Negative 07/28/2019 1558   KETONESUR NEGATIVE 05/14/2020 1043   PROTEINUR NEGATIVE 05/14/2020 1043   UROBILINOGEN 0.2 09/15/2016 1008   NITRITE NEGATIVE 05/14/2020 1043   LEUKOCYTESUR NEGATIVE 05/14/2020 1043   Sepsis Labs: @LABRCNTIP (procalcitonin:4,lacticidven:4)  ) Recent Results (from the past 240 hour(s))  Resp Panel by RT-PCR (Flu A&B, Covid) Nasopharyngeal Swab     Status: None   Collection Time: 10/29/20  8:51 PM   Specimen: Nasopharyngeal Swab; Nasopharyngeal(NP) swabs in vial transport medium  Result Value Ref Range Status   SARS Coronavirus 2 by RT PCR NEGATIVE NEGATIVE Final    Comment: (NOTE) SARS-CoV-2 target nucleic acids are NOT DETECTED.  The SARS-CoV-2 RNA is generally detectable in upper respiratory specimens during the acute phase of infection. The lowest concentration of SARS-CoV-2 viral copies this assay can detect is 138 copies/mL. A negative result does not preclude SARS-Cov-2 infection and should not be used as the sole basis for treatment or other patient management decisions. A negative result may occur with  improper specimen collection/handling, submission of specimen other than nasopharyngeal swab, presence of viral mutation(s) within the areas targeted by this assay, and inadequate number of viral copies(<138 copies/mL). A negative result must be combined with clinical observations, patient history, and epidemiological information. The expected result is Negative.  Fact Sheet for Patients:   10/31/20  Fact Sheet for Healthcare Providers:  BloggerCourse.com  This test is no t yet approved or cleared by the [employer] FDA and  has been authorized for detection and/or diagnosis of SARS-CoV-2 by FDA under an Emergency Use Authorization (EUA). This EUA will remain  in effect (meaning this test can be used) for the duration of the COVID-19 declaration under Section 564(b)(1) of the Act, 21 U.S.C.section 360bbb-3(b)(1), unless the authorization is terminated  or revoked sooner.       Influenza A by PCR NEGATIVE NEGATIVE Final   Influenza B by PCR NEGATIVE NEGATIVE Final    Comment: (NOTE) The Xpert Xpress SARS-CoV-2/FLU/RSV plus assay is intended as an aid in the diagnosis of influenza from Nasopharyngeal swab specimens and should not be used as a sole basis for treatment. Nasal washings and aspirates are unacceptable for Xpert Xpress SARS-CoV-2/FLU/RSV testing.  Fact Sheet for Patients: [place]  Fact Sheet for Healthcare Providers: BloggerCourse.com  This test is not yet approved or cleared by  the [employer] and has been authorized for detection and/or diagnosis of SARS-CoV-2 by FDA under an Emergency Use Authorization (EUA). This EUA will remain in effect (meaning this test can be used) for the duration of the COVID-19 declaration under Section 564(b)(1) of the Act, 21 U.S.C. section 360bbb-3(b)(1), unless the authorization is terminated or revoked.  Performed at [facility], [address] [place], [place] [address]     Radiology Studies: DG Chest 2 View  Result Date: 10/29/2020 CLINICAL DATA:  Short of breath EXAM: CHEST - 2 VIEW COMPARISON:  CT 07/05/2020 FINDINGS: Hyperinflation with emphysematous disease. Scarring at the apical left lung. No acute consolidation or pleural effusion. Normal cardiomediastinal silhouette. No  pneumothorax IMPRESSION: No active cardiopulmonary disease. Hyperinflation with emphysematous disease. Electronically Signed   By: Jasmine Pang M.D.   On: 10/29/2020 17:45    Scheduled Meds: . atorvastatin  80 mg Oral q1800  . buPROPion  150 mg Oral Daily  . clopidogrel  75 mg Oral Daily  . enoxaparin (LOVENOX) injection  40 mg Subcutaneous Q24H  . methylPREDNISolone sodium succinate  60 mg Intravenous Q12H  . mometasone-formoterol  2 puff Inhalation BID  . montelukast  10 mg Oral QHS  . nicotine  14 mg Transdermal Daily  . tamsulosin  0.4 mg Oral Daily  . umeclidinium bromide  1 puff Inhalation Daily   Continuous Infusions: . cefTRIAXone (ROCEPHIN)  IV Stopped (10/30/20 0044)     LOS: 1 day    Time spent:  Zannie Cove, MD [facility]  10/30/2020, 2:02 PM

## 2020-10-30 NOTE — Evaluation (Signed)
Physical Therapy Evaluation Patient Details Name: Nathaniel Garcia MRN: [id number] DOB: 1945-11-21 Today's Date: 10/30/2020   History of Present Illness  74yo male c/o increased SOB progressive over the past week. Hypoxic in the ED and required rescue BiPAP but able to be weaned back down to RA. Covid negative. Admit with COPD exacerbation. PMH COPD, HLD, CVA  Clinical Impression   Patient received in bed, pleasant and cooperative with PT today. Able to mobilize on a S-min guard level with no device; did have one initial posterior LOB on first sit to stand transfer requiring Min guard for safety, however no further instances of unsteadiness noted with dynamic activities. VSS on RA, but did have increased WOB with activity. Left on ED stretcher with all needs met, RN aware of patient status. Would benefit from skilled OP PT f/u for advanced strength, balance, and activity tolerance training after DC.     Follow Up Recommendations Outpatient PT    Equipment Recommendations  None recommended by PT    Recommendations for Other Services       Precautions / Restrictions Precautions Precautions: Other (comment) Precaution Comments: watch sats Restrictions Weight Bearing Restrictions: No      Mobility  Bed Mobility Overal bed mobility: Needs Assistance Bed Mobility: Supine to Sit;Sit to Supine     Supine to sit: Supervision Sit to supine: Supervision   General bed mobility comments: S for safety and assist with line management    Transfers Overall transfer level: Needs assistance Equipment used: None Transfers: Sit to/from Stand Sit to Stand: Min guard         General transfer comment: on first stand had one off posterior balance loss requiring min guard to regain balance, no LOB/unsteadiness noted on other transfers performed during session  Ambulation/Gait         Gait velocity: decreased   General Gait Details: able to perform multiple rounds of side  stepping and forward/backward stepping with min guard fading to S, no significant unsteadiness or LOB. VSS on RA but did have increased wheezing and WOB with activity  Stairs            Wheelchair Mobility    Modified Rankin (Stroke Patients Only)       Balance Overall balance assessment: Mild deficits observed, not formally tested                                           Pertinent Vitals/Pain Pain Assessment: No/denies pain    Home Living Family/patient expects to be discharged to:: Private residence Living Arrangements: Alone   Type of Home: Apartment Home Access: Level entry     Home Layout: One level Home Equipment: Grab bars - tub/shower      Prior Function Level of Independence: Independent               Hand Dominance        Extremity/Trunk Assessment   Upper Extremity Assessment Upper Extremity Assessment: Generalized weakness    Lower Extremity Assessment Lower Extremity Assessment: Generalized weakness    Cervical / Trunk Assessment Cervical / Trunk Assessment: Normal  Communication   Communication: No difficulties  Cognition Arousal/Alertness: Awake/alert Behavior During Therapy: WFL for tasks assessed/performed Overall Cognitive Status: Within Functional Limits for tasks assessed  General Comments General comments (skin integrity, edema, etc.): VSS on RA    Exercises     Assessment/Plan    PT Assessment Patient needs continued PT services  PT Problem List Decreased strength;Decreased activity tolerance;Decreased balance;Decreased mobility;Cardiopulmonary status limiting activity       PT Treatment Interventions DME instruction;Balance training;Gait training;Stair training;Functional mobility training;Patient/family education;Therapeutic activities;Therapeutic exercise    PT Goals (Current goals can be found in the Care Plan section)  Acute Rehab PT  Goals Patient Stated Goal: go home when medically ready PT Goal Formulation: With patient Time For Goal Achievement: 11/13/20 Potential to Achieve Goals: Good    Frequency Min 3X/week   Barriers to discharge        Co-evaluation               AM-PAC PT "6 Clicks" Mobility  Outcome Measure Help needed turning from your back to your side while in a flat bed without using bedrails?: None Help needed moving from lying on your back to sitting on the side of a flat bed without using bedrails?: None Help needed moving to and from a bed to a chair (including a wheelchair)?: A Little Help needed standing up from a chair using your arms (e.g., wheelchair or bedside chair)?: A Little Help needed to walk in hospital room?: A Little Help needed climbing 3-5 steps with a railing? : A Little 6 Click Score: 20    End of Session   Activity Tolerance: Patient tolerated treatment well Patient left: in bed;with call bell/phone within reach (ED stretcher) Nurse Communication: Mobility status PT Visit Diagnosis: Muscle weakness (generalized) (M62.81);Difficulty in walking, not elsewhere classified (R26.2)    Time: 7375-0510 PT Time Calculation (min) (ACUTE ONLY): 18 min   Charges:   PT Evaluation $PT Eval Low Complexity: 1 Low         Windell Norfolk, DPT, PN1   Supplemental Physical Therapist [place]    Pager [phone] Acute Rehab Office [phone]

## 2020-10-30 NOTE — ED Notes (Signed)
Lunch Tray Ordered @ 1048. °

## 2020-10-30 NOTE — Plan of Care (Signed)
  Problem: Education: Goal: Knowledge of General Education information will improve Description: Including pain rating scale, medication(s)/side effects and non-pharmacologic comfort measures 10/30/2020 2105 by Gearldine Bienenstock, RN Outcome: Progressing 10/30/2020 2105 by Gearldine Bienenstock, RN Outcome: Progressing 10/30/2020 2104 by Gearldine Bienenstock, RN Outcome: Progressing   Problem: Health Behavior/Discharge Planning: Goal: Ability to manage health-related needs will improve 10/30/2020 2105 by Gearldine Bienenstock, RN Outcome: Progressing 10/30/2020 2105 by Gearldine Bienenstock, RN Outcome: Progressing 10/30/2020 2104 by Gearldine Bienenstock, RN Outcome: Progressing   Problem: Clinical Measurements: Goal: Ability to maintain clinical measurements within normal limits will improve 10/30/2020 2105 by Gearldine Bienenstock, RN Outcome: Progressing 10/30/2020 2105 by Gearldine Bienenstock, RN Outcome: Progressing 10/30/2020 2104 by Gearldine Bienenstock, RN Outcome: Progressing Goal: Will remain free from infection 10/30/2020 2105 by Gearldine Bienenstock, RN Outcome: Progressing 10/30/2020 2105 by Gearldine Bienenstock, RN Outcome: Progressing 10/30/2020 2104 by Gearldine Bienenstock, RN Outcome: Progressing Goal: Diagnostic test results will improve 10/30/2020 2105 by Gearldine Bienenstock, RN Outcome: Progressing 10/30/2020 2105 by Gearldine Bienenstock, RN Outcome: Progressing 10/30/2020 2104 by Gearldine Bienenstock, RN Outcome: Progressing Goal: Respiratory complications will improve 10/30/2020 2105 by Gearldine Bienenstock, RN Outcome: Progressing 10/30/2020 2105 by Gearldine Bienenstock, RN Outcome: Progressing 10/30/2020 2104 by Gearldine Bienenstock, RN Outcome: Progressing Goal: Cardiovascular complication will be avoided 10/30/2020 2105 by Gearldine Bienenstock, RN Outcome: Progressing 10/30/2020 2105 by Gearldine Bienenstock, RN Outcome: Progressing 10/30/2020 2104 by Gearldine Bienenstock, RN Outcome: Progressing   Problem: Activity: Goal: Risk for activity intolerance will  decrease 10/30/2020 2105 by Gearldine Bienenstock, RN Outcome: Progressing 10/30/2020 2105 by Gearldine Bienenstock, RN Outcome: Progressing 10/30/2020 2104 by Gearldine Bienenstock, RN Outcome: Progressing   Problem: Nutrition: Goal: Adequate nutrition will be maintained 10/30/2020 2105 by Gearldine Bienenstock, RN Outcome: Progressing 10/30/2020 2105 by Gearldine Bienenstock, RN Outcome: Progressing 10/30/2020 2104 by Gearldine Bienenstock, RN Outcome: Progressing   Problem: Coping: Goal: Level of anxiety will decrease 10/30/2020 2105 by Gearldine Bienenstock, RN Outcome: Progressing 10/30/2020 2105 by Gearldine Bienenstock, RN Outcome: Progressing 10/30/2020 2104 by Gearldine Bienenstock, RN Outcome: Progressing   Problem: Elimination: Goal: Will not experience complications related to bowel motility 10/30/2020 2105 by Gearldine Bienenstock, RN Outcome: Progressing 10/30/2020 2105 by Gearldine Bienenstock, RN Outcome: Progressing 10/30/2020 2104 by Gearldine Bienenstock, RN Outcome: Progressing Goal: Will not experience complications related to urinary retention 10/30/2020 2105 by Gearldine Bienenstock, RN Outcome: Progressing 10/30/2020 2105 by Gearldine Bienenstock, RN Outcome: Progressing 10/30/2020 2104 by Gearldine Bienenstock, RN Outcome: Progressing   Problem: Pain Managment: Goal: General experience of comfort will improve 10/30/2020 2105 by Gearldine Bienenstock, RN Outcome: Progressing 10/30/2020 2105 by Gearldine Bienenstock, RN Outcome: Progressing 10/30/2020 2104 by Gearldine Bienenstock, RN Outcome: Progressing   Problem: Safety: Goal: Ability to remain free from injury will improve 10/30/2020 2105 by Gearldine Bienenstock, RN Outcome: Progressing 10/30/2020 2105 by Gearldine Bienenstock, RN Outcome: Progressing 10/30/2020 2104 by Gearldine Bienenstock, RN Outcome: Progressing   Problem: Skin Integrity: Goal: Risk for impaired skin integrity will decrease 10/30/2020 2105 by Gearldine Bienenstock, RN Outcome: Progressing 10/30/2020 2105 by Gearldine Bienenstock, RN Outcome:  Progressing 10/30/2020 2104 by Gearldine Bienenstock, RN Outcome: Progressing

## 2020-10-30 NOTE — Progress Notes (Signed)
Eval complete, formal documentation pending- would benefit from skilled OP PT f/u after DC, no equipment needs noted.  Madelaine Etienne, DPT, PN1   Supplemental Physical Therapist [facility]    Pager [phone] Acute Rehab Office [phone]

## 2020-10-30 NOTE — ED Notes (Signed)
Dinner Tray Ordered @ 1657. 

## 2020-10-31 DIAGNOSIS — J441 Chronic obstructive pulmonary disease with (acute) exacerbation: Secondary | ICD-10-CM | POA: Diagnosis present

## 2020-10-31 DIAGNOSIS — J9601 Acute respiratory failure with hypoxia: Secondary | ICD-10-CM | POA: Diagnosis not present

## 2020-10-31 DIAGNOSIS — Z8673 Personal history of transient ischemic attack (TIA), and cerebral infarction without residual deficits: Secondary | ICD-10-CM | POA: Diagnosis not present

## 2020-10-31 LAB — CBC
HCT: 37.6 % — ABNORMAL LOW (ref 39.0–52.0)
Hemoglobin: 13 g/dL (ref 13.0–17.0)
MCH: 34.2 pg — ABNORMAL HIGH (ref 26.0–34.0)
MCHC: 34.6 g/dL (ref 30.0–36.0)
MCV: 98.9 fL (ref 80.0–100.0)
Platelets: 212 10*3/uL (ref 150–400)
RBC: 3.8 MIL/uL — ABNORMAL LOW (ref 4.22–5.81)
RDW: 13.2 % (ref 11.5–15.5)
WBC: 11.9 10*3/uL — ABNORMAL HIGH (ref 4.0–10.5)
nRBC: 0 % (ref 0.0–0.2)

## 2020-10-31 LAB — BASIC METABOLIC PANEL
Anion gap: 9 (ref 5–15)
BUN: 16 mg/dL (ref 8–23)
CO2: 24 mmol/L (ref 22–32)
Calcium: 8.8 mg/dL — ABNORMAL LOW (ref 8.9–10.3)
Chloride: 104 mmol/L (ref 98–111)
Creatinine, Ser: 1.18 mg/dL (ref 0.61–1.24)
GFR, Estimated: >60 mL/min (ref >60)
Glucose, Bld: 146 mg/dL — ABNORMAL HIGH (ref 70–99)
Potassium: 4.4 mmol/L (ref 3.5–5.1)
Sodium: 137 mmol/L (ref 135–145)

## 2020-10-31 MED ORDER — PREDNISONE 10 MG (21) PO TBPK: ORAL_TABLET | ORAL | 0 refills | Status: DC

## 2020-10-31 MED ORDER — MONTELUKAST SODIUM 10 MG PO TABS: 10.0000 mg | ORAL_TABLET | Freq: Every day | ORAL | 0 refills | Status: DC

## 2020-10-31 MED ORDER — NICOTINE 14 MG/24HR TD PT24: 14.0000 mg | MEDICATED_PATCH | Freq: Every day | TRANSDERMAL | 0 refills | Status: DC

## 2020-10-31 MED ORDER — TRELEGY ELLIPTA 100-62.5-25 MCG/INH IN AEPB: 1.0000 | INHALATION_SPRAY | Freq: Every day | RESPIRATORY_TRACT | 0 refills | Status: DC

## 2020-10-31 MED ORDER — DOXYCYCLINE MONOHYDRATE 100 MG PO TABS: 100.0000 mg | ORAL_TABLET | Freq: Two times a day (BID) | ORAL | 0 refills | Status: AC

## 2020-10-31 NOTE — Discharge Summary (Signed)
Physician Discharge Summary  Channin Agustin ZOX:[id number] DOB: [birth year] DOA: 10/29/2020  PCP: Duanne Limerick, MD  Admit date: 10/29/2020 Discharge date: 10/31/2020  Admitted From: Home Disposition: Home  Recommendations for Outpatient Follow-up:  1. Follow up with PCP in 1-2 weeks 2. Follow up with Pulmonary within 1-2 weeks 3. Please obtain CMP/CBC, Mag, Phos in one week 4. Repeat CXR in 3-6 weeks  5. Please follow up on the following pending results:  Home Health: No; Recommendation is for Outpatient PT Equipment/Devices: None    Discharge Condition: Stable CODE STATUS: FULL CODE Diet recommendation: Heart Healthy Diet    Brief/Interim Summary: HPI per Dr. Lyda Perone on 10/29/20 Nathaniel Garcia is a 74 y.o. male with medical history significant of COPD, stroke.  Pt still smoking.  Pt presents to ED with gradual onset of SOB over the past 2 weeks.  Progressively worsening, severe today.  Symptoms worse with exertion, nothing makes them better.  Symptoms are constant.  No CP, no fever nor cough, no abd pain, no leg swellnig.   ED Course: CXR just shows emphysema, COVID neg.    Pt satting 83% on 4L via [place] initially, pt put on rescue BIPAP and now satting 100%.  Symptoms persist despite multiple neb treatments, steroids.  **Interim History His respiratory status improved significantly and his renal for supplemental oxygen via nasal cannula.  He was ambulated and did not requiring supplemental oxygen via nasal cannula and felt close to his baseline.  His shortness of breath is much improved and his inhalers were refilled.  Patient is to follow-up with Dr. Meredeth Ide of pulmonary at [facility] and he understands agrees with plan of care.  PT recommending outpatient PT and OT recommending no follow-up.  Patient stable to be discharged home at this time will need to follow-up with PCP and pulmonary as stated above  Discharge Diagnoses:  Principal Problem:    COPD exacerbation (HCC) Active Problems:   Acute respiratory failure with hypoxia (HCC)   History of stroke   Acute exacerbation of chronic obstructive pulmonary disease (COPD) (HCC)  Acute hypoxic respiratory failure COPD exacerbation -Clinically improving and significantly improved back to his baseline, off BiPAP  -chest x-ray noted emphysema otherwise unremarkable, Covid PCR is negative -Continue IV ceftriaxone  and changed to p.o. doxycycline for 4 more days -Continue Solu-Medrol 60 mg every 12 today and transition to a Sterapred today -Continue duo nebs, Mucinex while hospitalized and transition back to home Trelegy Inhaler as an outpatient along with albuterol 2 puffs every 6 as needed for wheezing or shortness of breath -Wean off O2 -SpO2: 97 % O2 Flow Rate (L/min): 5 L/min FiO2 (%): 21 % (pt taking a break from bipap) -Patient is no longer wearing supplemental oxygen via nasal cannula and ambulated without issues and had minimal dyspnea and did not desaturate -Continuous pulse oximetry and maintain O2 saturations greater than 90% -Continue supplemental oxygen via nasal cannula wean O2 as tolerated -Follow-up with pulmonary in outpatient setting and will need to call Dr. Meredeth Ide at [facility] to schedule appointment  Tobacco abuse -Smoking cessation counseling given -Continue with nicotine patch at discharge  History of CVA -Continue clopidogrel 75 mg p.o. daily as well as atorvastatin 80 mg p.o. daily  Hyperlipidemia -Continue with atorvastatin 80 mg daily  BPH -Continue with tamsulosin 0.4 mg daily  Hyperglycemia -Was elevated on admission with a glucose of 120 and has been ranging from 146-145 on daily BMP/CMP -Check hemoglobin A1c in outpatient setting -Likely was  reactive in the setting of steroid margination on admission but will need to rule out diabetes as an outpatient -follow-up blood sugars with PCP  Leukocytosis  -Patient's WBC went from 13.4 -> 6.4 and  now worsened in the setting of Steroid Demargination -Patient does not appear to have any infectious etiology -Continue monitor for signs and symptoms infection -CBC within 1 week and his leukocytosis likely reactive in the setting of his COPD exacerbation admission  Discharge Instructions  Discharge Instructions    Call MD for:  difficulty breathing, headache or visual disturbances   Complete by: As directed    Call MD for:  extreme fatigue   Complete by: As directed    Call MD for:  hives   Complete by: As directed    Call MD for:  persistant dizziness or light-headedness   Complete by: As directed    Call MD for:  persistant nausea and vomiting   Complete by: As directed    Call MD for:  redness, tenderness, or signs of infection (pain, swelling, redness, odor or green/yellow discharge around incision site)   Complete by: As directed    Call MD for:  severe uncontrolled pain   Complete by: As directed    Call MD for:  temperature >100.4   Complete by: As directed    Diet - low sodium heart healthy   Complete by: As directed    Discharge instructions   Complete by: As directed    You were cared for by a hospitalist during your hospital stay. If you have any questions about your discharge medications or the care you received while you were in the hospital after you are discharged, you can call the unit and ask to speak with the hospitalist on call if the hospitalist that took care of you is not available. Once you are discharged, your primary care physician will handle any further medical issues. Please note that NO REFILLS for any discharge medications will be authorized once you are discharged, as it is imperative that you return to your primary care physician (or establish a relationship with a primary care physician if you do not have one) for your aftercare needs so that they can reassess your need for medications and monitor your lab values.  Follow up with PCP and Pulmonary in  the outpatient setting. Take all medications as prescribed. If symptoms change or worsen please return to the ED for evaluation   Increase activity slowly   Complete by: As directed      Allergies as of 10/31/2020   No Known Allergies     Medication List    TAKE these medications   albuterol 108 (90 Base) MCG/ACT inhaler Commonly known as: VENTOLIN HFA Inhale 2 puffs into the lungs every 6 (six) hours as needed for wheezing or shortness of breath.   atorvastatin 80 MG tablet Commonly known as: LIPITOR Take 1 tablet (80 mg total) by mouth daily.   buPROPion 150 MG 24 hr tablet Commonly known as: WELLBUTRIN XL Take 150 mg by mouth daily.   clopidogrel 75 MG tablet Commonly known as: PLAVIX Take 1 tablet (75 mg total) by mouth daily.   doxycycline 100 MG tablet Commonly known as: ADOXA Take 1 tablet (100 mg total) by mouth 2 (two) times daily for 4 days.   montelukast 10 MG tablet Commonly known as: SINGULAIR Take 1 tablet (10 mg total) by mouth at bedtime.   nicotine 14 mg/24hr patch Commonly known as: NICODERM CQ -  dosed in mg/24 hours Place 1 patch (14 mg total) onto the skin daily. Start taking on: November 01, 2020   predniSONE 10 MG (21) Tbpk tablet Commonly known as: STERAPRED UNI-PAK 21 TAB Take tablets on day 1, 5 tablets on day 2, 4 tablets on day 3, 3 tablets on day 4, 2 tablets on day 5, 1 tablet on day 6 and then stop on day 7   tamsulosin 0.4 MG Caps capsule Commonly known as: FLOMAX Take 1 capsule (0.4 mg total) by mouth daily.   Trelegy Ellipta 100-62.5-25 MCG/INH Aepb Generic drug: Fluticasone-Umeclidin-Vilant Inhale 1 puff into the lungs daily.       Follow-up Information    Duanne Limerick, MD. Call.   Specialty: Family Medicine Why: Follow up within 1-2 weeks Contact information: [address] [place] [address] [phone]        Mertie Moores, MD. Call.   Specialty: Specialist Why: Follow up within 1-2  weeks  Contact information: [address] [place] [address] [phone]              No Known Allergies  Consultations:  None  Procedures/Studies: DG Chest 2 View  Result Date: 10/29/2020 CLINICAL DATA:  Short of breath EXAM: CHEST - 2 VIEW COMPARISON:  CT 07/05/2020 FINDINGS: Hyperinflation with emphysematous disease. Scarring at the apical left lung. No acute consolidation or pleural effusion. Normal cardiomediastinal silhouette. No pneumothorax IMPRESSION: No active cardiopulmonary disease. Hyperinflation with emphysematous disease. Electronically Signed   By: Jasmine Pang M.D.   On: 10/29/2020 17:45     Subjective: Seen And examined at bedside and patient was close to his baseline and improved significantly.  No longer requiring supplemental oxygen.  Felt well and wanting to go home.  Denies any other concerns or complaints at this time and denies any chest pain.  Discharge Exam: Vitals:   10/31/20 1000 10/31/20 1023  BP:    Pulse: 87 (!) 101  Resp:    Temp:    SpO2: 100% 97%   Vitals:   10/31/20 0736 10/31/20 0811 10/31/20 1000 10/31/20 1023  BP:  118/74    Pulse:  67 87 (!) 101  Resp:  18    Temp:  97.8 F (36.6 C)    TempSrc:      SpO2: 97% 100% 100% 97%  Weight:      Height:       General: Pt is alert, awake, not in acute distress Cardiovascular: RRR, S1/S2 +, no rubs, no gallops Respiratory: Diminished bilaterally with coarse breath sounds, no wheezing, no rhonchi; unlabored breathing and not wearing supplemental oxygen via nasal cannula Abdominal: Soft, NT, ND, bowel sounds + Extremities: no edema, no cyanosis  The results of significant diagnostics from this hospitalization (including imaging, microbiology, ancillary and laboratory) are listed below for reference.    Microbiology: Recent Results (from the past 240 hour(s))  Resp Panel by RT-PCR (Flu A&B, Covid) Nasopharyngeal Swab     Status: None   Collection Time: 10/29/20  8:51  PM   Specimen: Nasopharyngeal Swab; Nasopharyngeal(NP) swabs in vial transport medium  Result Value Ref Range Status   SARS Coronavirus 2 by RT PCR NEGATIVE NEGATIVE Final    Comment: (NOTE) SARS-CoV-2 target nucleic acids are NOT DETECTED.  The SARS-CoV-2 RNA is generally detectable in upper respiratory specimens during the acute phase of infection. The lowest concentration of SARS-CoV-2 viral copies this assay can detect is 138 copies/mL. A negative result does not preclude  SARS-Cov-2 infection and should not be used as the sole basis for treatment or other patient management decisions. A negative result may occur with  improper specimen collection/handling, submission of specimen other than nasopharyngeal swab, presence of viral mutation(s) within the areas targeted by this assay, and inadequate number of viral copies(<138 copies/mL). A negative result must be combined with clinical observations, patient history, and epidemiological information. The expected result is Negative.  Fact Sheet for Patients:  BloggerCourse.com  Fact Sheet for Healthcare Providers:  SeriousBroker.it  This test is no t yet approved or cleared by the [place] FDA and  has been authorized for detection and/or diagnosis of SARS-CoV-2 by FDA under an Emergency Use Authorization (EUA). This EUA will remain  in effect (meaning this test can be used) for the duration of the COVID-19 declaration under Section 564(b)(1) of the Act, 21 U.S.C.section 360bbb-3(b)(1), unless the authorization is terminated  or revoked sooner.       Influenza A by PCR NEGATIVE NEGATIVE Final   Influenza B by PCR NEGATIVE NEGATIVE Final    Comment: (NOTE) The Xpert Xpress SARS-CoV-2/FLU/RSV plus assay is intended as an aid in the diagnosis of influenza from Nasopharyngeal swab specimens and should not be used as a sole basis for treatment. Nasal washings and aspirates are  unacceptable for Xpert Xpress SARS-CoV-2/FLU/RSV testing.  Fact Sheet for Patients: BloggerCourse.com  Fact Sheet for Healthcare Providers: SeriousBroker.it  This test is not yet approved or cleared by the [place] FDA and has been authorized for detection and/or diagnosis of SARS-CoV-2 by FDA under an Emergency Use Authorization (EUA). This EUA will remain in effect (meaning this test can be used) for the duration of the COVID-19 declaration under Section 564(b)(1) of the Act, 21 U.S.C. section 360bbb-3(b)(1), unless the authorization is terminated or revoked.  Performed at [facility], [address], [place], [place] [address]     Labs: BNP (last 3 results) Recent Labs    10/29/20 2052  BNP 84.2   Basic Metabolic Panel: Recent Labs  Lab 10/29/20 2221 10/29/20 2327 10/30/20 0335 10/31/20 0219  NA 138 141 139 137  K 5.1 4.0 4.2 4.4  CL 106  --  104 104  CO2 18*  --  22 24  GLUCOSE 120*  --  175* 146*  BUN 14  --  14 16  CREATININE 1.11  --  1.17 1.18  CALCIUM 8.9  --  9.4 8.8*   Liver Function Tests: Recent Labs  Lab 10/29/20 2221  AST 21  ALT 9  ALKPHOS 60  BILITOT 1.4*  PROT 6.6  ALBUMIN 3.6   No results for input(s): LIPASE, AMYLASE in the last 168 hours. No results for input(s): AMMONIA in the last 168 hours. CBC: Recent Labs  Lab 10/29/20 2221 10/29/20 2327 10/30/20 0335 10/31/20 0219  WBC 13.4*  --  6.4 11.9*  HGB 14.6 14.6 14.2 13.0  HCT 42.2 43.0 42.4 37.6*  MCV 100.7*  --  101.7* 98.9  PLT 161  --  204 212   Cardiac Enzymes: No results for input(s): CKTOTAL, CKMB, CKMBINDEX, TROPONINI in the last 168 hours. BNP: Invalid input(s): POCBNP CBG: Recent Labs  Lab 10/30/20 2016  GLUCAP 156*   D-Dimer No results for input(s): DDIMER in the last 72 hours. Hgb A1c No results for input(s): HGBA1C in the last 72 hours. Lipid Profile No results for input(s): CHOL, HDL,  LDLCALC, TRIG, CHOLHDL, LDLDIRECT in the last 72 hours. Thyroid function studies No results  for input(s): TSH, T4TOTAL, T3FREE, THYROIDAB in the last 72 hours.  Invalid input(s): FREET3 Anemia work up No results for input(s): VITAMINB12, FOLATE, FERRITIN, TIBC, IRON, RETICCTPCT in the last 72 hours. Urinalysis    Component Value Date/Time   COLORURINE YELLOW 05/14/2020 1043   APPEARANCEUR CLEAR 05/14/2020 1043   APPEARANCEUR Clear 07/28/2019 1558   LABSPEC 1.025 05/14/2020 1043   PHURINE 7.0 05/14/2020 1043   GLUCOSEU NEGATIVE 05/14/2020 1043   HGBUR TRACE (A) 05/14/2020 1043   BILIRUBINUR NEGATIVE 05/14/2020 1043   BILIRUBINUR Negative 07/28/2019 1558   KETONESUR NEGATIVE 05/14/2020 1043   PROTEINUR NEGATIVE 05/14/2020 1043   UROBILINOGEN 0.2 09/15/2016 1008   NITRITE NEGATIVE 05/14/2020 1043   LEUKOCYTESUR NEGATIVE 05/14/2020 1043   Sepsis Labs Invalid input(s): PROCALCITONIN,  WBC,  LACTICIDVEN Microbiology Recent Results (from the past 240 hour(s))  Resp Panel by RT-PCR (Flu A&B, Covid) Nasopharyngeal Swab     Status: None   Collection Time: 10/29/20  8:51 PM   Specimen: Nasopharyngeal Swab; Nasopharyngeal(NP) swabs in vial transport medium  Result Value Ref Range Status   SARS Coronavirus 2 by RT PCR NEGATIVE NEGATIVE Final    Comment: (NOTE) SARS-CoV-2 target nucleic acids are NOT DETECTED.  The SARS-CoV-2 RNA is generally detectable in upper respiratory specimens during the acute phase of infection. The lowest concentration of SARS-CoV-2 viral copies this assay can detect is 138 copies/mL. A negative result does not preclude SARS-Cov-2 infection and should not be used as the sole basis for treatment or other patient management decisions. A negative result may occur with  improper specimen collection/handling, submission of specimen other than nasopharyngeal swab, presence of viral mutation(s) within the areas targeted by this assay, and inadequate number of  viral copies(<138 copies/mL). A negative result must be combined with clinical observations, patient history, and epidemiological information. The expected result is Negative.  Fact Sheet for Patients:  BloggerCourse.comhttps://www.fda.gov/media/152166/download  Fact Sheet for Healthcare Providers:  SeriousBroker.ithttps://www.fda.gov/media/152162/download  This test is no t yet approved or cleared by the [place] FDA and  has been authorized for detection and/or diagnosis of SARS-CoV-2 by FDA under an Emergency Use Authorization (EUA). This EUA will remain  in effect (meaning this test can be used) for the duration of the COVID-19 declaration under Section 564(b)(1) of the Act, 21 U.S.C.section 360bbb-3(b)(1), unless the authorization is terminated  or revoked sooner.       Influenza A by PCR NEGATIVE NEGATIVE Final   Influenza B by PCR NEGATIVE NEGATIVE Final    Comment: (NOTE) The Xpert Xpress SARS-CoV-2/FLU/RSV plus assay is intended as an aid in the diagnosis of influenza from Nasopharyngeal swab specimens and should not be used as a sole basis for treatment. Nasal washings and aspirates are unacceptable for Xpert Xpress SARS-CoV-2/FLU/RSV testing.  Fact Sheet for Patients: BloggerCourse.comhttps://www.fda.gov/media/152166/download  Fact Sheet for Healthcare Providers: SeriousBroker.ithttps://www.fda.gov/media/152162/download  This test is not yet approved or cleared by the [place] FDA and has been authorized for detection and/or diagnosis of SARS-CoV-2 by FDA under an Emergency Use Authorization (EUA). This EUA will remain in effect (meaning this test can be used) for the duration of the COVID-19 declaration under Section 564(b)(1) of the Act, 21 U.S.C. section 360bbb-3(b)(1), unless the authorization is terminated or revoked.  Performed at [facility], [address] [place], [place] [id number]    Time coordinating discharge: 35 minutes  SIGNED:  Merlene Laughtermair Latif Zissy Hamlett, DO [facility] 10/31/2020,  6:20 PM Pager is on AMION  If 7PM-7AM, please contact night-coverage  [email or web address]

## 2020-10-31 NOTE — Care Management Obs Status (Signed)
MEDICARE OBSERVATION STATUS NOTIFICATION   Patient Details  Name: Nathaniel Garcia MRN: [id number] Date of Birth: 1946/08/11   Medicare Observation Status Notification Given:  Yes    Beckie Busing, RN 10/31/2020, 12:12 PM

## 2020-10-31 NOTE — Discharge Instructions (Signed)
Chronic Obstructive Pulmonary Disease Exacerbation Chronic obstructive pulmonary disease (COPD) is a long-term (chronic) lung problem. In COPD, the flow of air from the lungs is limited. COPD exacerbations are times that breathing gets worse and you need more than your normal treatment. Without treatment, they can be life threatening. If they happen often, your lungs can become more damaged. If your COPD gets worse, your doctor may treat you with:  Medicines.  Oxygen.  Different ways to clear your airway, such as using a mask. Follow these instructions at home: Medicines  Take over-the-counter and prescription medicines only as told by your doctor.  If you take an antibiotic or steroid medicine, do not stop taking the medicine even if you start to feel better.  Keep up with shots (vaccinations) as told by your doctor. Be sure to get a yearly (annual) flu shot. Lifestyle  Do not smoke. If you need help quitting, ask your doctor.  Eat healthy foods.  Exercise regularly.  Get plenty of sleep.  Avoid tobacco smoke and other things that can bother your lungs.  Wash your hands often with soap and water. This will help keep you from getting an infection. If you cannot use soap and water, use hand sanitizer.  During flu season, avoid areas that are crowded with people. General instructions  Drink enough fluid to keep your pee (urine) clear or pale yellow. Do not do this if your doctor has told you not to.  Use a cool mist machine (vaporizer).  If you use oxygen or a machine that turns medicine into a mist (nebulizer), continue to use it as told.  Follow all instructions for rehabilitation. These are steps you can take to make your body work better.  Keep all follow-up visits as told by your doctor. This is important. Contact a doctor if:  Your COPD symptoms get worse than normal. Get help right away if:  You are short of breath and it gets worse.  You have trouble  talking.  You have chest pain.  You cough up blood.  You have a fever.  You keep throwing up (vomiting).  You feel weak or you pass out (faint).  You feel confused.  You are not able to sleep because of your symptoms.  You are not able to do daily activities. Summary  COPD exacerbations are times that breathing gets worse and you need more treatment than normal.  COPD exacerbations can be very serious and may cause your lungs to become more damaged.  Do not smoke. If you need help quitting, ask your doctor.  Stay up-to-date on your shots. Get a flu shot every year. This information is not intended to replace advice given to you by your health care provider. Make sure you discuss any questions you have with your health care provider. Document Revised: 10/16/2017 Document Reviewed: 12/08/2016 Elsevier Patient Education  2020 [employer]. Suggestions For Increasing Calories And Protein . Several small meals a day are easier to eat and digest than three large ones. Space meals about 2 to 3 hours apart to maximize comfort. . Stop eating 2 to 3 hours before bed and sleep with your head elevated if gastric reflux (GERD) and heartburn are problems. . Do not eat your favorite foods if you are feeling bad. Save them for when you feel good! . Eat breakfast-type foods at any meal. Eggs are usually easy to eat and are great any time of the day. (The same goes for pancakes and waffles.) .  Eat when you feel hungry. Most people have the greatest appetite in the morning because they have not eaten all night. If this is the best meal for you, then pile on those calories and other nutrients in the morning and at lunch. Then you can have a smaller dinner without losing total calories for the day. . Eat leftovers or nutritious snacks in the afternoon and early evening to round out your day. . Try homemade or commercially prepared nutrition bars and puddings, as well as calorie- and protein-rich  liquid nutritional supplements. Benefits of Physical Activity Talk to your doctor about physical activity. Light or moderate physical activity can help maintain muscle and promote an appetite. Walking in the neighborhood or the local mall is a great way to get up, get out, and get moving. If you are unsteady on your feet, try walking around the dining room table. Save Room for [employer]! Drink most fluids between meals instead of with meals. (It is fine to have a sip to help swallow food at meal time.) Fluids (which usually have fewer calories and nutrients than solid food) can take up valuable space in your stomach.  Foods Recommended High-Protein Foods Milk products Add cheese to toast, crackers, sandwiches, baked potatoes, vegetables, soups, noodles, meat, and fruit. Use reduced-fat (2%) or whole milk in place of water when cooking cereal and cream soups. Include cream sauces on vegetables and pasta. Add powdered milk to cream soups and mashed potatoes.  Eggs Have hard-cooked eggs readily available in the refrigerator. Chop and add to salads, casseroles, soups, and vegetables. Make a quick egg salad. All eggs should be well cooked to avoid the risk of harmful bacteria.  Meats, poultry, and fish Add leftover cooked meats to soups, casseroles, salads, and omelets. Make dip by mixing diced, chopped, or shredded meat with sour cream and spices.  Beans, legumes, nuts, and seeds Sprinkle nuts and seeds on cereals, fruit, and desserts such as ice cream, pudding, and custard. Also serve nuts and seeds on vegetables, salads, and pasta. Spread peanut butter on toast, bread, English muffins, and fruit, or blend it in a milk shake. Add beans and peas to salads, soups, casseroles, and vegetable dishes.  High-Calorie Foods Butter, margarine, and  oils Melt butter or margarine over potatoes, rice, pasta, and cooked vegetables. Add melted butter or margarine into soups and casseroles and spread on bread for  sandwiches before spreading sandwich spread or peanut butter. Saut or stir-fry vegetables, meats, chicken and fish such as shrimp/scallops in olive or canola oil. A variety of oils add calories and can be used to [employer], chicken, or fish.  Milk products Add whipping cream to desserts, pancakes, waffles, fruit, and hot chocolate, and fold it into soups and casseroles. Add sour cream to baked potatoes and vegetables.  Salad dressing Use regular (not low-fat or diet) mayonnaise and salad dressing on sandwiches and in dips with vegetables and fruit.   Sweets Add jelly and honey to bread and crackers. Add jam to fruit and ice cream and as a topping over cake.   Copyright 2020  Academy of Nutrition and Dietetics. All rights reserved.  Nutrition Post Hospital Stay Proper nutrition can help your body recover from illness and injury.   Foods and beverages high in protein, vitamins, and minerals help rebuild muscle loss, promote healing, & reduce fall risk.   .In addition to eating healthy foods, a nutrition shake is an easy, delicious way to get the nutrition you need during  and after your hospital stay  It is recommended that you continue to drink 2 bottles per day of:       Ensure Complete for at least 1 month (30 days) after your hospital stay   Tips for adding a nutrition shake into your routine: As allowed, drink one with vitamins or medications instead of water or juice Enjoy one as a tasty mid-morning or afternoon snack Drink cold or make a milkshake out of it Drink one instead of milk with cereal or snacks Use as a coffee creamer   Available at the following grocery stores and pharmacies:           * Karin Golden * [employer] * [employer]co  * Rite Aid          * Walmart * [employer] Club  * Walgreens      * Target  * BJ's   * [facility]  * [employer]   * [facility] [phone]            For COUPONS visit: www.ensure.com/join or RoleLink.com.br   Suggested  Substitutions Ensure Plus = Boost Plus = Carnation Breakfast Essentials = Boost Compact Ensure Active Clear = Boost Breeze Glucerna Shake = Boost Glucose Control = Carnation Breakfast Essentials SUGAR FREE

## 2020-10-31 NOTE — Care Management CC44 (Signed)
Condition Code 44 Documentation Completed  Patient Details  Name: Lukka Black MRN: [id number] Date of Birth: 18-Dec-1945   Condition Code 44 given:  Yes Patient signature on Condition Code 44 notice:  Yes Documentation of 2 MD's agreement:  Yes Code 44 added to claim:  Yes    Beckie Busing, RN 10/31/2020, 12:13 PM

## 2020-10-31 NOTE — Progress Notes (Signed)
   10/31/20 1030  Clinical Encounter Type  Visited With Patient  Visit Type Initial  Referral From Nurse  Consult/Referral To Chaplain  Chaplain responded to consult. Physical therapist present to assist patient in walking. Patient seem excited about walking and being discharged. Patient requested Advanced Directives. Stated he wants his daughter who lives in [place], [place] to be his healthcare POA. Stated he will review and let us know when he is ready to proceed. Chaplain provided social support. Chaplain learned the patient has rich history in [place], familiar with [place]'s history and prominent families. 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 This note was prepared by Deneen Harts, M.Div..  For questions please contact by phone [phone].

## 2020-10-31 NOTE — Evaluation (Signed)
Occupational Therapy Evaluation Patient Details Name: Nathaniel Garcia MRN: [id number] DOB: 1946/01/20 Today's Date: 10/31/2020    History of Present Illness 74yo male c/o increased SOB progressive over the past week. Hypoxic in the ED and required rescue BiPAP but able to be weaned back down to RA. Covid negative. Admit with COPD exacerbation. PMH COPD, HLD, CVA   Clinical Impression   Pt admitted with the above diagnoses and presents with below problem list. Pt will benefit from continued acute OT to address the below listed deficits and maximize independence with basic ADLs prior to d/c home. PTA pt was independent with ADLs, drives. Pt is currently setup to supervision with most ADLs, up to min guard assist with shower transfers. Pt walked short community distance this session (about 80') on RA, no AD utilized. O2 sats >97 during session. Pt takes his time ambulating and during OOB activity at baseline to manage his DOE. Discussed energy conservation strategies. DOE 1/4 during session.      Follow Up Recommendations  No OT follow up    Equipment Recommendations  None recommended by OT    Recommendations for Other Services       Precautions / Restrictions Precautions Precautions: Other (comment) Precaution Comments: watch sats Restrictions Weight Bearing Restrictions: No      Mobility Bed Mobility Overal bed mobility: Needs Assistance Bed Mobility: Supine to Sit     Supine to sit: Supervision     General bed mobility comments: supervision for safety    Transfers Overall transfer level: Needs assistance Equipment used: None Transfers: Sit to/from Stand           General transfer comment: supervision for safety. Pt takes his time to manage DOE and mild balance deficits. Single extremity on rail on initial stand.    Balance Overall balance assessment: Mild deficits observed, not formally tested                                          ADL either performed or assessed with clinical judgement   ADL Overall ADL's : Needs assistance/impaired Eating/Feeding: Set up;Sitting   Grooming: Set up;Supervision/safety;Sitting;Standing   Upper Body Bathing: Set up;Sitting   Lower Body Bathing: Min guard;Sit to/from stand   Upper Body Dressing : Set up;Sitting   Lower Body Dressing: Min guard;Sit to/from stand   Toilet Transfer: Supervision/safety;Ambulation   Toileting- Clothing Manipulation and Hygiene: Supervision/safety;Sit to/from stand   Tub/ Shower Transfer: Supervision/safety   Functional mobility during ADLs: Supervision/safety General ADL Comments: Pt walked short community distance mobility on RA, no AD. DOE  1/4. Discussed energy conservation strategies and provided handout. Pt takes his time walking at baseline to manage baseline DOE per his report     Vision Baseline Vision/History: Wears glasses Wears Glasses: At all times       Perception     Praxis      Pertinent Vitals/Pain Pain Assessment: No/denies pain     Hand Dominance     Extremity/Trunk Assessment Upper Extremity Assessment Upper Extremity Assessment: Generalized weakness   Lower Extremity Assessment Lower Extremity Assessment: Defer to PT evaluation;Generalized weakness   Cervical / Trunk Assessment Cervical / Trunk Assessment: Normal   Communication Communication Communication: No difficulties   Cognition Arousal/Alertness: Awake/alert Behavior During Therapy: WFL for tasks assessed/performed Overall Cognitive Status: Within Functional Limits for tasks assessed  General Comments       Exercises     Shoulder Instructions      Home Living Family/patient expects to be discharged to:: Private residence Living Arrangements: Alone   Type of Home: Apartment Home Access: Level entry     Home Layout: One level     Bathroom Shower/Tub: Contractor: Standard     Home Equipment: Grab bars - tub/shower          Prior Functioning/Environment Level of Independence: Independent                 OT Problem List: Impaired balance (sitting and/or standing);Decreased knowledge of use of DME or AE;Decreased knowledge of precautions;Decreased activity tolerance;Cardiopulmonary status limiting activity      OT Treatment/Interventions:      OT Goals(Current goals can be found in the care plan section) Acute Rehab OT Goals Patient Stated Goal: go home when medically ready OT Goal Formulation: With patient Time For Goal Achievement: 11/14/20 Potential to Achieve Goals: Good ADL Goals Pt Will Perform Grooming: Independently;standing Pt Will Perform Tub/Shower Transfer: Independently;ambulating  OT Frequency:     Barriers to D/C:            Co-evaluation              AM-PAC OT "6 Clicks" Daily Activity     Outcome Measure Help from another person eating meals?: None Help from another person taking care of personal grooming?: None Help from another person toileting, which includes using toliet, bedpan, or urinal?: None Help from another person bathing (including washing, rinsing, drying)?: A Little Help from another person to put on and taking off regular upper body clothing?: None Help from another person to put on and taking off regular lower body clothing?: None 6 Click Score: 23   End of Session Nurse Communication: Other (comment);Mobility status (O2 sats on RA walking)  Activity Tolerance: Patient tolerated treatment well;Other (comment) (DOE 1/4. O2 sats >97 on RA.) Patient left: in bed;with call bell/phone within reach (sitting EOB)  OT Visit Diagnosis: Unsteadiness on feet (R26.81);Muscle weakness (generalized) (M62.81)                Time: 4580-9983 OT Time Calculation (min): 22 min Charges:  OT General Charges $OT Visit: 1 Visit OT Evaluation $OT Eval Low Complexity: 1 Low  Raynald Kemp,  OT Acute Rehabilitation Services Pager: [phone] Office: [phone]   Pilar Grammes 10/31/2020, 10:42 AM

## 2020-11-04 NOTE — ED Provider Notes (Signed)
Erroneous encounter- this patient was sent to ER before he was seen by provider (me).     Rhys Martini, PA-C 11/04/20 1007

## 2020-11-05 ENCOUNTER — Ambulatory Visit (INDEPENDENT_AMBULATORY_CARE_PROVIDER_SITE_OTHER): Payer: Medicare HMO | Admitting: Family Medicine

## 2020-11-05 ENCOUNTER — Other Ambulatory Visit: Payer: Self-pay

## 2020-11-05 ENCOUNTER — Inpatient Hospital Stay: Payer: Medicare HMO | Admitting: Family Medicine

## 2020-11-05 ENCOUNTER — Ambulatory Visit: Payer: Medicare HMO | Admitting: Family Medicine

## 2020-11-05 ENCOUNTER — Encounter: Payer: Self-pay | Admitting: Family Medicine

## 2020-11-05 VITALS — BP 120/70 | HR 80 | Ht 72.0 in | Wt 122.0 lb

## 2020-11-05 DIAGNOSIS — J432 Centrilobular emphysema: Secondary | ICD-10-CM | POA: Diagnosis not present

## 2020-11-05 DIAGNOSIS — F329 Major depressive disorder, single episode, unspecified: Secondary | ICD-10-CM

## 2020-11-05 DIAGNOSIS — R69 Illness, unspecified: Secondary | ICD-10-CM | POA: Diagnosis not present

## 2020-11-05 MED ORDER — BUPROPION HCL ER (XL) 150 MG PO TB24
150.0000 mg | ORAL_TABLET | Freq: Every day | ORAL | 11 refills | Status: DC
Start: 2020-11-05 — End: 2021-01-03

## 2020-11-05 NOTE — Progress Notes (Signed)
Date:  11/05/2020   Name:  Nathaniel Garcia   DOB:  Oct 18, 1946   MRN:  [id number]   Chief Complaint: Hospitalization Follow-up and Depression (Trying to quit smoking)  Pt was recently admitted to [facility] on 12/14  and was discharged on 12/15. Transition of care call placed on not done.   Depression        This is a chronic problem.  The current episode started more than 1 year ago.   The onset quality is gradual.   The problem has been gradually improving since onset.  Associated symptoms include no decreased concentration, no fatigue, no helplessness, no hopelessness, does not have insomnia, not irritable, no restlessness, no decreased interest, no appetite change, no body aches, no myalgias, no headaches, no indigestion, not sad and no suicidal ideas.     The symptoms are aggravated by nothing.  Past treatments include other medications.  Compliance with treatment is good.  Past compliance problems include difficulty with treatment plan.  Previous treatment provided moderate relief.   Lab Results  Component Value Date   CREATININE 1.18 10/31/2020   BUN 16 10/31/2020   NA 137 10/31/2020   K 4.4 10/31/2020   CL 104 10/31/2020   CO2 24 10/31/2020   Lab Results  Component Value Date   CHOL 177 06/29/2018   HDL 54 06/29/2018   LDLCALC 110 (H) 06/29/2018   TRIG 64 06/29/2018   CHOLHDL 3.3 06/29/2018   Lab Results  Component Value Date   TSH 1.650 05/30/2019   No results found for: HGBA1C Lab Results  Component Value Date   WBC 11.9 (H) 10/31/2020   HGB 13.0 10/31/2020   HCT 37.6 (L) 10/31/2020   MCV 98.9 10/31/2020   PLT 212 10/31/2020   Lab Results  Component Value Date   ALT 9 10/29/2020   AST 21 10/29/2020   ALKPHOS 60 10/29/2020   BILITOT 1.4 (H) 10/29/2020     Review of Systems  Constitutional: Negative for appetite change, chills, fatigue and fever.  HENT: Negative for drooling, ear discharge, ear pain and sore throat.   Respiratory:  Negative for cough, shortness of breath and wheezing.   Cardiovascular: Negative for chest pain, palpitations and leg swelling.  Gastrointestinal: Negative for abdominal pain, blood in stool, constipation, diarrhea and nausea.  Endocrine: Negative for polydipsia.  Genitourinary: Negative for dysuria, frequency, hematuria and urgency.  Musculoskeletal: Negative for back pain, myalgias and neck pain.  Skin: Negative for rash.  Allergic/Immunologic: Negative for environmental allergies.  Neurological: Negative for dizziness and headaches.  Hematological: Does not bruise/bleed easily.  Psychiatric/Behavioral: Positive for depression. Negative for decreased concentration and suicidal ideas. The patient is not nervous/anxious and does not have insomnia.     Patient Active Problem List   Diagnosis Date Noted  . Acute exacerbation of chronic obstructive pulmonary disease (COPD) (HCC) 10/31/2020  . COPD exacerbation (HCC) 10/29/2020  . Acute respiratory failure with hypoxia (HCC) 10/29/2020  . History of stroke 10/29/2020  . Aortic atherosclerosis (HCC) 12/30/2018  . Benign hematuria 09/18/2016  . Weakness of left hand 07/02/2016  . Lipid screening 01/03/2016  . Cerebral vascular disease 09/14/2015  . Carotid artery narrowing 08/15/2015  . Bilateral carotid artery stenosis 08/15/2015  . Bradycardia 08/01/2015  . H/O transient cerebral ischemia 08/01/2015  . Combined fat and carbohydrate induced hyperlipemia 08/01/2015  . Breathlessness on exertion 07/30/2015  . Absolute anemia 04/03/2015  . 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  . Cerebral vascular accident (HCC) 04/03/2015  .  HLD (hyperlipidemia) 04/03/2015  . Blood glucose elevated 04/03/2015  . Lung nodule, solitary 04/03/2015  . Non compliance with medical treatment 04/03/2015  . Lung nodule, multiple 04/03/2015  . Cerebral infarction (HCC) 04/03/2015  . Abnormal lung field 04/03/2015  . Alcohol dependence (HCC) 04/03/2015     No Known Allergies  Past Surgical History:  Procedure Laterality Date  . COLONOSCOPY  2011   normal- MD docs    Social History   Tobacco Use  . Smoking status: Former Smoker    Packs/day: 0.02    Years: 60.00    Pack years: 1.20    Types: Cigarettes    Quit date: 10/26/2020    Years since quitting: 0.0  . Smokeless tobacco: Never Used  . Tobacco comment: [place] smoking cessation information provided  Vaping Use  . Vaping Use: Never used  Substance Use Topics  . Alcohol use: Yes    Alcohol/week: 1.0 standard drink    Types: 1 Cans of beer per week  . Drug use: Yes    Comment: marijuana     Medication list has been reviewed and updated.  Current Meds  Medication Sig  . albuterol (VENTOLIN HFA) 108 (90 Base) MCG/ACT inhaler Inhale 2 puffs into the lungs every 6 (six) hours as needed for wheezing or shortness of breath.  Marland Kitchen atorvastatin (LIPITOR) 80 MG tablet Take 1 tablet (80 mg total) by mouth daily.  Marland Kitchen buPROPion (WELLBUTRIN XL) 150 MG 24 hr tablet Take 150 mg by mouth daily.  . clopidogrel (PLAVIX) 75 MG tablet Take 1 tablet (75 mg total) by mouth daily.  . montelukast (SINGULAIR) 10 MG tablet Take 1 tablet (10 mg total) by mouth at bedtime.  . predniSONE (STERAPRED UNI-PAK 21 TAB) 10 MG (21) TBPK tablet Take tablets on day 1, 5 tablets on day 2, 4 tablets on day 3, 3 tablets on day 4, 2 tablets on day 5, 1 tablet on day 6 and then stop on day 7  . tamsulosin (FLOMAX) 0.4 MG CAPS capsule Take 1 capsule (0.4 mg total) by mouth daily.  . TRELEGY ELLIPTA 100-62.5-25 MCG/INH AEPB Inhale 1 puff into the lungs daily.    PHQ 2/9 Scores 11/05/2020 06/27/2020 05/31/2020 02/13/2020  PHQ - 2 Score 0 0 0 0  PHQ- 9 Score 2 - 0 0    GAD 7 : Generalized Anxiety Score 11/05/2020 05/31/2020 02/13/2020  Nervous, Anxious, on Edge 1 0 0  Control/stop worrying 0 0 0  Worry too much - different things 0 0 0  Trouble relaxing 0 0 0  Restless 0 0 0  Easily annoyed or irritable 0 0  0  Afraid - awful might happen 0 0 0  Total GAD 7 Score 1 0 0  Anxiety Difficulty Not difficult at all - -    BP Readings from Last 3 Encounters:  11/05/20 120/70  10/31/20 118/74  10/29/20 (!) 162/84    Physical Exam Vitals and nursing note reviewed.  Constitutional:      General: He is not irritable. HENT:     Head: Normocephalic.     Right Ear: Tympanic membrane, ear canal and external ear normal.     Left Ear: Tympanic membrane, ear canal and external ear normal.     Nose: Nose normal. No congestion or rhinorrhea.     Mouth/Throat:     Mouth: Oropharynx is clear and moist. Mucous membranes are moist.  Eyes:     General: No scleral icterus.  Right eye: No discharge.        Left eye: No discharge.     Extraocular Movements: EOM normal.     Conjunctiva/sclera: Conjunctivae normal.     Pupils: Pupils are equal, round, and reactive to light.  Neck:     Thyroid: No thyromegaly.     Vascular: No JVD.     Trachea: No tracheal deviation.  Cardiovascular:     Rate and Rhythm: Normal rate and regular rhythm.     Pulses: Intact distal pulses.     Heart sounds: Normal heart sounds. No murmur heard. No friction rub. No gallop.   Pulmonary:     Effort: No respiratory distress.     Breath sounds: Normal breath sounds. No wheezing, rhonchi or rales.  Abdominal:     General: Bowel sounds are normal.     Palpations: Abdomen is soft. There is no hepatosplenomegaly or mass.     Tenderness: There is no abdominal tenderness. There is no CVA tenderness, guarding or rebound.  Musculoskeletal:        General: No tenderness or edema. Normal range of motion.     Cervical back: Normal range of motion and neck supple.  Lymphadenopathy:     Cervical: No cervical adenopathy.  Skin:    General: Skin is warm.     Findings: No rash.  Neurological:     Mental Status: He is alert and oriented to person, place, and time.     Cranial Nerves: No cranial nerve deficit.     Deep Tendon  Reflexes: Strength normal and reflexes are normal and symmetric.     Wt Readings from Last 3 Encounters:  10/29/20 121 lb 11.1 oz (55.2 kg)  07/31/20 121 lb 11.2 oz (55.2 kg)  07/17/20 124 lb (56.2 kg)    BP 120/70   Pulse 80   Assessment and Plan:  1. Centrilobular emphysema (HCC) Chronic.  Controlled.  Stable.  Patient unable to afford the Trelegy.  We will give him a sample and he will be seen Dr. Meredeth Ide on 11/07/2020.  Patient's been encouraged to continue not to smoke as that he has severe centrilobular emphysema and paraseptal emphysema.  2. Reactive depression Chronic.  Controlled.  PHQ 2 Gad score 1.  Continue bupropion XL 150 mg 1 a day. - buPROPion (WELLBUTRIN XL) 150 MG 24 hr tablet; Take 1 tablet (150 mg total) by mouth daily.  Dispense: 30 tablet; Refill: 11

## 2020-11-07 DIAGNOSIS — R0602 Shortness of breath: Secondary | ICD-10-CM | POA: Diagnosis not present

## 2020-11-07 DIAGNOSIS — J449 Chronic obstructive pulmonary disease, unspecified: Secondary | ICD-10-CM | POA: Diagnosis not present

## 2020-11-23 DIAGNOSIS — J449 Chronic obstructive pulmonary disease, unspecified: Secondary | ICD-10-CM | POA: Diagnosis not present

## 2020-11-26 DIAGNOSIS — J449 Chronic obstructive pulmonary disease, unspecified: Secondary | ICD-10-CM | POA: Diagnosis not present

## 2020-12-12 DIAGNOSIS — R0600 Dyspnea, unspecified: Secondary | ICD-10-CM | POA: Diagnosis not present

## 2020-12-12 DIAGNOSIS — J449 Chronic obstructive pulmonary disease, unspecified: Secondary | ICD-10-CM | POA: Diagnosis not present

## 2020-12-12 DIAGNOSIS — J432 Centrilobular emphysema: Secondary | ICD-10-CM | POA: Diagnosis not present

## 2020-12-27 DIAGNOSIS — J449 Chronic obstructive pulmonary disease, unspecified: Secondary | ICD-10-CM | POA: Diagnosis not present

## 2020-12-31 ENCOUNTER — Encounter: Payer: Self-pay | Admitting: Family Medicine

## 2020-12-31 ENCOUNTER — Other Ambulatory Visit: Payer: Self-pay

## 2020-12-31 ENCOUNTER — Ambulatory Visit (INDEPENDENT_AMBULATORY_CARE_PROVIDER_SITE_OTHER): Payer: Medicare HMO | Admitting: Family Medicine

## 2020-12-31 VITALS — BP 138/80 | HR 80 | Ht 72.0 in | Wt 126.0 lb

## 2020-12-31 DIAGNOSIS — B354 Tinea corporis: Secondary | ICD-10-CM | POA: Diagnosis not present

## 2020-12-31 MED ORDER — CLOTRIMAZOLE-BETAMETHASONE 1-0.05 % EX CREA: 1.0000 "application " | TOPICAL_CREAM | Freq: Two times a day (BID) | CUTANEOUS | 0 refills | Status: DC

## 2020-12-31 NOTE — Progress Notes (Signed)
Date:  12/31/2020   Name:  Nathaniel Garcia   DOB:  [birth year]   MRN:  [id number]   Chief Complaint: Rash (Itching under R) arm near armpit. Changed body wash)  Rash This is a recurrent problem. The current episode started 1 to 4 weeks ago. The problem has been gradually worsening since onset. The affected locations include the right axilla. The rash is characterized by redness and itchiness. Associated with: trial neosporin. Pertinent negatives include no anorexia, congestion, cough, diarrhea, eye pain, facial edema, fatigue, fever, joint pain, nail changes, rhinorrhea, shortness of breath, sore throat or vomiting.    Lab Results  Component Value Date   CREATININE 1.18 10/31/2020   BUN 16 10/31/2020   NA 137 10/31/2020   K 4.4 10/31/2020   CL 104 10/31/2020   CO2 24 10/31/2020   Lab Results  Component Value Date   CHOL 177 06/29/2018   HDL 54 06/29/2018   LDLCALC 110 (H) 06/29/2018   TRIG 64 06/29/2018   CHOLHDL 3.3 06/29/2018   Lab Results  Component Value Date   TSH 1.650 05/30/2019   No results found for: HGBA1C Lab Results  Component Value Date   WBC 11.9 (H) 10/31/2020   HGB 13.0 10/31/2020   HCT 37.6 (L) 10/31/2020   MCV 98.9 10/31/2020   PLT 212 10/31/2020   Lab Results  Component Value Date   ALT 9 10/29/2020   AST 21 10/29/2020   ALKPHOS 60 10/29/2020   BILITOT 1.4 (H) 10/29/2020     Review of Systems  Constitutional: Negative for chills, fatigue and fever.  HENT: Negative for congestion, drooling, ear discharge, ear pain, rhinorrhea and sore throat.   Eyes: Negative for pain.  Respiratory: Negative for cough, shortness of breath and wheezing.   Cardiovascular: Negative for chest pain, palpitations and leg swelling.  Gastrointestinal: Negative for abdominal pain, anorexia, blood in stool, constipation, diarrhea, nausea and vomiting.  Endocrine: Negative for polydipsia.  Genitourinary: Negative for dysuria, frequency, hematuria and urgency.   Musculoskeletal: Negative for back pain, joint pain, myalgias and neck pain.  Skin: Positive for rash. Negative for nail changes.  Allergic/Immunologic: Negative for environmental allergies.  Neurological: Negative for dizziness and headaches.  Hematological: Does not bruise/bleed easily.  Psychiatric/Behavioral: Negative for suicidal ideas. The patient is not nervous/anxious.     Patient Active Problem List   Diagnosis Date Noted  . Acute exacerbation of chronic obstructive pulmonary disease (COPD) (HCC) 10/31/2020  . COPD exacerbation (HCC) 10/29/2020  . Acute respiratory failure with hypoxia (HCC) 10/29/2020  . History of stroke 10/29/2020  . Aortic atherosclerosis (HCC) 12/30/2018  . Benign hematuria 09/18/2016  . Weakness of left hand 07/02/2016  . Lipid screening 01/03/2016  . Cerebral vascular disease 09/14/2015  . Carotid artery narrowing 08/15/2015  . Bilateral carotid artery stenosis 08/15/2015  . Bradycardia 08/01/2015  . H/O transient cerebral ischemia 08/01/2015  . Combined fat and carbohydrate induced hyperlipemia 08/01/2015  . Breathlessness on exertion 07/30/2015  . Absolute anemia 04/03/2015  . 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  . Cerebral vascular accident (HCC) 04/03/2015  . HLD (hyperlipidemia) 04/03/2015  . Blood glucose elevated 04/03/2015  . Lung nodule, solitary 04/03/2015  . Non compliance with medical treatment 04/03/2015  . Lung nodule, multiple 04/03/2015  . Cerebral infarction (HCC) 04/03/2015  . Abnormal lung field 04/03/2015  . Alcohol dependence (HCC) 04/03/2015    No Known Allergies  Past Surgical History:  Procedure Laterality Date  . COLONOSCOPY  2011   normal-  [facility] docs    Social History   Tobacco Use  . Smoking status: Former Smoker    Packs/day: 0.02    Years: 60.00    Pack years: 1.20    Types: Cigarettes    Quit date: 10/26/2020    Years since quitting: 0.1  . Smokeless tobacco: Never Used  . Tobacco comment: [facility] smoking cessation information provided  Vaping Use  . Vaping Use: Never used  Substance Use Topics  . Alcohol use: Yes    Alcohol/week: 1.0 standard drink    Types: 1 Cans of beer per week  . Drug use: Yes    Comment: marijuana     Medication list has been reviewed and updated.  Current Meds  Medication Sig  . albuterol (VENTOLIN HFA) 108 (90 Base) MCG/ACT inhaler Inhale 2 puffs into the lungs every 6 (six) hours as needed for wheezing or shortness of breath.  Marland Kitchen atorvastatin (LIPITOR) 80 MG tablet Take 1 tablet (80 mg total) by mouth daily.  Marland Kitchen buPROPion (WELLBUTRIN XL) 150 MG 24 hr tablet Take 1 tablet (150 mg total) by mouth daily.  . clopidogrel (PLAVIX) 75 MG tablet Take 1 tablet (75 mg total) by mouth daily.  . montelukast (SINGULAIR) 10 MG tablet Take 1 tablet (10 mg total) by mouth at bedtime.  . tamsulosin (FLOMAX) 0.4 MG CAPS capsule Take 1 capsule (0.4 mg total) by mouth daily.  . TRELEGY ELLIPTA 100-62.5-25 MCG/INH AEPB Inhale 1 puff into the lungs daily.    PHQ 2/9 Scores 12/31/2020 11/05/2020 06/27/2020 05/31/2020  PHQ - 2 Score 0 0 0 0  PHQ- 9 Score 0 2 - 0    GAD 7 : Generalized Anxiety Score 12/31/2020 11/05/2020 05/31/2020 02/13/2020  Nervous, Anxious, on Edge 0 1 0 0  Control/stop worrying 0 0 0 0  Worry too much - different things 0 0 0 0  Trouble relaxing 0 0 0 0  Restless 0 0 0 0  Easily annoyed or irritable 0 0 0 0  Afraid - awful might happen 0 0 0 0  Total GAD 7 Score 0 1 0 0  Anxiety Difficulty - Not difficult at all - -    BP Readings from Last 3 Encounters:  12/31/20 138/80  11/05/20 120/70  10/31/20 118/74    Physical Exam Vitals and nursing note reviewed.  HENT:     Head: Normocephalic.     Right Ear: Tympanic membrane, ear canal and external ear normal. There is no impacted cerumen.     Left Ear: Tympanic membrane, ear canal and external ear normal. There is no impacted cerumen.     Nose: Nose normal. No congestion or rhinorrhea.      Mouth/Throat:     Mouth: Oropharynx is clear and moist. Mucous membranes are moist.  Eyes:     General: No scleral icterus.       Right eye: No discharge.        Left eye: No discharge.     Extraocular Movements: EOM normal.     Conjunctiva/sclera: Conjunctivae normal.     Pupils: Pupils are equal, round, and reactive to light.  Neck:     Thyroid: No thyromegaly.     Vascular: No JVD.     Trachea: No tracheal deviation.  Cardiovascular:     Rate and Rhythm: Normal rate and regular rhythm.     Pulses: Intact distal pulses.     Heart sounds: Normal heart sounds. No murmur heard. No friction rub.  No gallop.   Pulmonary:     Effort: No respiratory distress.     Breath sounds: Normal breath sounds. No wheezing, rhonchi or rales.  Abdominal:     General: Bowel sounds are normal.     Palpations: Abdomen is soft. There is no hepatosplenomegaly or mass.     Tenderness: There is no abdominal tenderness. There is no CVA tenderness, right CVA tenderness, left CVA tenderness, guarding or rebound.  Musculoskeletal:        General: No tenderness or edema. Normal range of motion.     Cervical back: Normal range of motion and neck supple.  Lymphadenopathy:     Cervical: No cervical adenopathy.  Skin:    General: Skin is warm.     Findings: Erythema and rash present. Rash is macular.     Comments: Left axillary  Neurological:     Mental Status: He is alert and oriented to person, place, and time.     Cranial Nerves: No cranial nerve deficit.     Motor: No weakness.     Deep Tendon Reflexes: Strength normal and reflexes are normal and symmetric.     Wt Readings from Last 3 Encounters:  12/31/20 126 lb (57.2 kg)  11/05/20 122 lb (55.3 kg)  10/29/20 121 lb 11.1 oz (55.2 kg)    BP 138/80   Pulse 80   Ht 6' (1.829 m)   Wt 126 lb (57.2 kg)   BMI 17.09 kg/m   Assessment and Plan: 1. Tinea corporis Chronic.  Recurrent.  New onset of a similar rash that he had in the same area about 1  to 2 years ago.  This resolved on ultrasound 1 application twice a day. - clotrimazole-betamethasone (LOTRISONE) cream; Apply 1 application topically 2 (two) times daily.  Dispense: 30 g; Refill: 0

## 2021-01-03 ENCOUNTER — Emergency Department (HOSPITAL_COMMUNITY): Payer: Medicare HMO

## 2021-01-03 ENCOUNTER — Observation Stay (HOSPITAL_COMMUNITY)
Admission: EM | Admit: 2021-01-03 | Discharge: 2021-01-04 | Disposition: A | Discharge status: Home / Self Care | Payer: Medicare HMO | Attending: Internal Medicine | Admitting: Internal Medicine

## 2021-01-03 ENCOUNTER — Other Ambulatory Visit: Payer: Self-pay

## 2021-01-03 ENCOUNTER — Encounter (HOSPITAL_COMMUNITY): Payer: Self-pay | Admitting: Emergency Medicine

## 2021-01-03 DIAGNOSIS — R0602 Shortness of breath: Secondary | ICD-10-CM | POA: Diagnosis not present

## 2021-01-03 DIAGNOSIS — Z87891 Personal history of nicotine dependence: Secondary | ICD-10-CM | POA: Insufficient documentation

## 2021-01-03 DIAGNOSIS — Z20822 Contact with and (suspected) exposure to covid-19: Secondary | ICD-10-CM | POA: Insufficient documentation

## 2021-01-03 DIAGNOSIS — Z79899 Other long term (current) drug therapy: Secondary | ICD-10-CM | POA: Diagnosis not present

## 2021-01-03 DIAGNOSIS — Z8673 Personal history of transient ischemic attack (TIA), and cerebral infarction without residual deficits: Secondary | ICD-10-CM | POA: Insufficient documentation

## 2021-01-03 DIAGNOSIS — J439 Emphysema, unspecified: Secondary | ICD-10-CM

## 2021-01-03 DIAGNOSIS — J9601 Acute respiratory failure with hypoxia: Secondary | ICD-10-CM | POA: Diagnosis not present

## 2021-01-03 DIAGNOSIS — J449 Chronic obstructive pulmonary disease, unspecified: Secondary | ICD-10-CM | POA: Diagnosis not present

## 2021-01-03 DIAGNOSIS — J209 Acute bronchitis, unspecified: Secondary | ICD-10-CM | POA: Diagnosis not present

## 2021-01-03 DIAGNOSIS — J441 Chronic obstructive pulmonary disease with (acute) exacerbation: Secondary | ICD-10-CM | POA: Insufficient documentation

## 2021-01-03 DIAGNOSIS — Z7902 Long term (current) use of antithrombotics/antiplatelets: Secondary | ICD-10-CM | POA: Insufficient documentation

## 2021-01-03 LAB — CBC
HCT: 43.1 % (ref 39.0–52.0)
Hemoglobin: 13.9 g/dL (ref 13.0–17.0)
MCH: 33.9 pg (ref 26.0–34.0)
MCHC: 32.3 g/dL (ref 30.0–36.0)
MCV: 105.1 fL — ABNORMAL HIGH (ref 80.0–100.0)
Platelets: 190 10*3/uL (ref 150–400)
RBC: 4.1 MIL/uL — ABNORMAL LOW (ref 4.22–5.81)
RDW: 13.6 % (ref 11.5–15.5)
WBC: 9.3 10*3/uL (ref 4.0–10.5)
nRBC: 0 % (ref 0.0–0.2)

## 2021-01-03 LAB — POC SARS CORONAVIRUS 2 AG -  ED: SARS Coronavirus 2 Ag: NEGATIVE

## 2021-01-03 LAB — BASIC METABOLIC PANEL
Anion gap: 9 (ref 5–15)
BUN: 12 mg/dL (ref 8–23)
CO2: 24 mmol/L (ref 22–32)
Calcium: 9.2 mg/dL (ref 8.9–10.3)
Chloride: 105 mmol/L (ref 98–111)
Creatinine, Ser: 1.11 mg/dL (ref 0.61–1.24)
GFR, Estimated: >60 mL/min (ref >60)
Glucose, Bld: 124 mg/dL — ABNORMAL HIGH (ref 70–99)
Potassium: 3.6 mmol/L (ref 3.5–5.1)
Sodium: 138 mmol/L (ref 135–145)

## 2021-01-03 MED ORDER — TAMSULOSIN HCL 0.4 MG PO CAPS
0.4000 mg | ORAL_CAPSULE | Freq: Every day | ORAL | Status: DC
  Administered 2021-01-04: 0.4 mg via ORAL
  Filled 2021-01-03: qty 1

## 2021-01-03 MED ORDER — IPRATROPIUM-ALBUTEROL 0.5-2.5 (3) MG/3ML IN SOLN
3.0000 mL | Freq: Once | RESPIRATORY_TRACT | Status: AC
  Administered 2021-01-03: 3 mL via RESPIRATORY_TRACT
  Filled 2021-01-03: qty 3

## 2021-01-03 MED ORDER — BUPROPION HCL ER (XL) 150 MG PO TB24
150.0000 mg | ORAL_TABLET | Freq: Every day | ORAL | Status: DC
  Administered 2021-01-04: 150 mg via ORAL
  Filled 2021-01-03: qty 1

## 2021-01-03 MED ORDER — METHYLPREDNISOLONE SODIUM SUCC 125 MG IJ SOLR
60.0000 mg | Freq: Once | INTRAMUSCULAR | Status: AC
  Administered 2021-01-03: 60 mg via INTRAVENOUS
  Filled 2021-01-03: qty 2

## 2021-01-03 MED ORDER — FLUTICASONE-UMECLIDIN-VILANT 100-62.5-25 MCG/INH IN AEPB: 1.0000 | INHALATION_SPRAY | Freq: Every day | RESPIRATORY_TRACT | Status: DC

## 2021-01-03 MED ORDER — PREDNISONE 20 MG PO TABS
40.0000 mg | ORAL_TABLET | Freq: Every day | ORAL | Status: DC
  Administered 2021-01-04: 40 mg via ORAL
  Filled 2021-01-03: qty 2

## 2021-01-03 MED ORDER — CLOTRIMAZOLE-BETAMETHASONE 1-0.05 % EX CREA
1.0000 "application " | TOPICAL_CREAM | Freq: Two times a day (BID) | CUTANEOUS | Status: DC
  Administered 2021-01-04: 1 via TOPICAL
  Filled 2021-01-03: qty 15

## 2021-01-03 MED ORDER — CLOPIDOGREL BISULFATE 75 MG PO TABS
75.0000 mg | ORAL_TABLET | Freq: Every day | ORAL | Status: DC
  Administered 2021-01-04: 75 mg via ORAL
  Filled 2021-01-03: qty 1

## 2021-01-03 MED ORDER — ALBUTEROL SULFATE (2.5 MG/3ML) 0.083% IN NEBU: 2.5000 mg | INHALATION_SOLUTION | RESPIRATORY_TRACT | Status: DC | PRN

## 2021-01-03 MED ORDER — ENOXAPARIN SODIUM 40 MG/0.4ML ~~LOC~~ SOLN
40.0000 mg | SUBCUTANEOUS | Status: DC
  Administered 2021-01-03: 40 mg via SUBCUTANEOUS
  Filled 2021-01-03: qty 0.4

## 2021-01-03 MED ORDER — MONTELUKAST SODIUM 10 MG PO TABS
10.0000 mg | ORAL_TABLET | Freq: Every day | ORAL | Status: DC
  Administered 2021-01-03: 10 mg via ORAL
  Filled 2021-01-03 (×2): qty 1

## 2021-01-03 MED ORDER — ATORVASTATIN CALCIUM 80 MG PO TABS
80.0000 mg | ORAL_TABLET | Freq: Every day | ORAL | Status: DC
  Administered 2021-01-04: 80 mg via ORAL
  Filled 2021-01-03: qty 1

## 2021-01-03 MED ORDER — CEFTRIAXONE SODIUM 1 G IJ SOLR
1.0000 g | INTRAMUSCULAR | Status: DC
  Administered 2021-01-03: 1 g via INTRAVENOUS
  Filled 2021-01-03: qty 10

## 2021-01-03 NOTE — Medical Student Note (Signed)
[facility]-EMERGENCY DEPT Provider Student Note For educational purposes for Medical, PA and NP students only and not part of the legal medical record.   CSN: [id number] Arrival date & time: 01/03/21  1626      History   Chief Complaint Chief Complaint  Patient presents with  . Shortness of Breath    HPI Nathaniel Garcia is a 75 y.o. male with PMH stroke and COPD, bullous emphysema who presents to ED for SOB.   States he went to his pulmonologist today after having cough and worsening SOB for about a week. They gave him Xoponex nebulizer with improvement in symptoms, CXR and sent him home. States at home walking from car to apartment he felt like he "couldn't get enough air." States he tried his albuterol nebulizer 2-3 times without relief. Also endorsed "feeling warm" like he "needed air." He called pulmonology back to instructed him to present to ED. Denies chest pain, palpitations, lightheadedness or dizziness. Denies fevers or increased sputum production. Denies sick contacts.   He does not wear oxygen at home or use NIV. He believes that this started because he started smoking cigarettes again about a week ago. States he quit for a month and then started smoking about 0.5ppd again. He also states he has not been able to use his Trelegy inhaler, which he was prescribed on discharge 10/29/2020 from here. States even with insurance it is too expensive. Per last pulmonology note, he is to take duo nebs TID, budesonide 0.5 nebulizer q12h, prednisone 10mg /day and to follow up in two months.   Past Medical History:  Diagnosis Date  . COPD (chronic obstructive pulmonary disease) (HCC)   . Hematuria   . Hyperlipidemia   . Stroke [facility])    Patient Active Problem List   Diagnosis Date Noted  . Acute exacerbation of chronic obstructive pulmonary disease (COPD) (HCC) 10/31/2020  . COPD exacerbation (HCC) 10/29/2020  . Acute respiratory failure with hypoxia (HCC) 10/29/2020  . History of  stroke 10/29/2020  . Aortic atherosclerosis (HCC) 12/30/2018  . Benign hematuria 09/18/2016  . Weakness of left hand 07/02/2016  . Lipid screening 01/03/2016  . Cerebral vascular disease 09/14/2015  . Carotid artery narrowing 08/15/2015  . Bilateral carotid artery stenosis 08/15/2015  . Bradycardia 08/01/2015  . H/O transient cerebral ischemia 08/01/2015  . Combined fat and carbohydrate induced hyperlipemia 08/01/2015  . Breathlessness on exertion 07/30/2015  . Absolute anemia 04/03/2015  . 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  . Cerebral vascular accident (HCC) 04/03/2015  . HLD (hyperlipidemia) 04/03/2015  . Blood glucose elevated 04/03/2015  . Lung nodule, solitary 04/03/2015  . Non compliance with medical treatment 04/03/2015  . Lung nodule, multiple 04/03/2015  . Cerebral infarction (HCC) 04/03/2015  . Abnormal lung field 04/03/2015  . Alcohol dependence (HCC) 04/03/2015    Past Surgical History:  Procedure Laterality Date  . COLONOSCOPY  2011   normal- MD docs     Home Medications    Prior to Admission medications   Medication Sig Start Date End Date Taking? Authorizing Provider  albuterol (VENTOLIN HFA) 108 (90 Base) MCG/ACT inhaler Inhale 2 puffs into the lungs every 6 (six) hours as needed for wheezing or shortness of breath. 05/31/20   06/02/20, MD  atorvastatin (LIPITOR) 80 MG tablet Take 1 tablet (80 mg total) by mouth daily. 05/31/20   06/02/20, MD  buPROPion (WELLBUTRIN XL) 150 MG 24 hr tablet Take 1 tablet (150 mg total) by mouth daily. 11/05/20 11/05/21  Duanne Limerick, MD  clopidogrel (PLAVIX) 75 MG tablet Take 1 tablet (75 mg total) by mouth daily. 05/31/20   Duanne Limerick, MD  clotrimazole-betamethasone (LOTRISONE) cream Apply 1 application topically 2 (two) times daily. 12/31/20   Duanne Limerick, MD  montelukast (SINGULAIR) 10 MG tablet Take 1 tablet (10 mg total) by mouth at bedtime. 10/31/20   Marguerita Merles Latif, DO  tamsulosin  (FLOMAX) 0.4 MG CAPS capsule Take 1 capsule (0.4 mg total) by mouth daily. 02/13/20   McGowan, Shannon A, PA-C  TRELEGY ELLIPTA 100-62.5-25 MCG/INH AEPB Inhale 1 puff into the lungs daily. 10/31/20   Merlene Laughter, DO    Family History Family History  Problem Relation Age of Onset  . Prostate cancer Neg Hx     Social History Social History   Tobacco Use  . Smoking status: Former Smoker    Packs/day: 0.02    Years: 60.00    Pack years: 1.20    Types: Cigarettes    Quit date: 10/26/2020    Years since quitting: 0.1  . Smokeless tobacco: Never Used  . Tobacco comment: [place] smoking cessation information provided  Vaping Use  . Vaping Use: Never used  Substance Use Topics  . Alcohol use: Yes    Alcohol/week: 1.0 standard drink    Types: 1 Cans of beer per week  . Drug use: Yes    Comment: marijuana    Allergies   Patient has no known allergies.  Review of Systems Review of Systems  Constitutional: Negative for chills, diaphoresis and fever.  HENT: Negative for congestion, sinus pressure and sore throat.   Eyes: Negative.   Respiratory: Positive for cough, shortness of breath and wheezing.   Cardiovascular: Negative for chest pain, palpitations and leg swelling.  Gastrointestinal: Negative.   Endocrine: Negative.   Genitourinary: Negative.   Musculoskeletal: Negative.   Skin: Negative.   Allergic/Immunologic: Negative.   Neurological: Negative for dizziness and light-headedness.  Hematological: Negative.   Psychiatric/Behavioral: Negative.    Physical Exam Updated Vital Signs BP (!) 161/81 (BP Location: Right Arm)   Pulse 97   Temp 98.1 F (36.7 C) (Oral)   Resp (!) 26   SpO2 99%   Physical Exam Constitutional:      General: He is not in acute distress.    Appearance: He is not diaphoretic.  HENT:     Head: Normocephalic.     Mouth/Throat:     Mouth: Mucous membranes are moist.     Pharynx: Oropharynx is clear.  Eyes:     Extraocular  Movements: Extraocular movements intact.     Pupils: Pupils are equal, round, and reactive to light.  Cardiovascular:     Rate and Rhythm: Normal rate and regular rhythm.     Heart sounds: No murmur heard.   Pulmonary:     Effort: Tachypnea and accessory muscle usage present.     Breath sounds: Transmitted upper airway sounds present. Examination of the right-upper field reveals wheezing. Examination of the left-upper field reveals wheezing. Examination of the right-middle field reveals wheezing. Examination of the right-lower field reveals wheezing. Examination of the left-lower field reveals wheezing. Wheezing present.  Chest:     Chest wall: No tenderness.  Abdominal:     Palpations: Abdomen is soft.     Tenderness: There is no abdominal tenderness.  Musculoskeletal:        General: Normal range of motion.     Right lower leg: No edema.  Left lower leg: No edema.  Skin:    General: Skin is warm and dry.     Capillary Refill: Capillary refill takes less than 2 seconds.     Coloration: Skin is not cyanotic.  Neurological:     General: No focal deficit present.     Mental Status: He is alert.  Psychiatric:        Mood and Affect: Mood normal.    ED Treatments / Results  Labs (all labs ordered are listed, but only abnormal results are displayed) Labs Reviewed  BASIC METABOLIC PANEL - Abnormal; Notable for the following components:      Result Value   Glucose, Bld 124 (*)    All other components within normal limits  CBC - Abnormal; Notable for the following components:   RBC 4.10 (*)    MCV 105.1 (*)    All other components within normal limits  POC SARS CORONAVIRUS 2 AG -  ED    EKG  Radiology DG Chest 2 View  Result Date: 01/03/2021 CLINICAL DATA:  Shortness of breath EXAM: CHEST - 2 VIEW COMPARISON:  October 29, 2020 chest radiograph; chest CT July 05, 2020 FINDINGS: Lungs are hyperexpanded. Underlying emphysematous change again noted with scarring in the  upper lobes, more severe on the left than on the right, stable. There is no appreciable edema or airspace opacity. The heart size is normal. The pulmonary vascularity reflects the underlying emphysema. No adenopathy. No bone lesions. Skin staples overlie the upper chest. IMPRESSION: Underlying emphysematous change with areas of scarring, stable. No edema or airspace opacity. Stable cardiac silhouette. Pulmonary vascularity reflects underlying emphysema. No adenopathy. Emphysema (ICD10-J43.9). Electronically Signed   By: Bretta Bang III M.D.   On: 01/03/2021 17:05   Procedures Procedures (including critical care time)  Medications Ordered in ED Medications  ipratropium-albuterol (DUONEB) 0.5-2.5 (3) MG/3ML nebulizer solution 3 mL (3 mLs Nebulization Given 01/03/21 1813)  methylPREDNISolone sodium succinate (SOLU-MEDROL) 125 mg/2 mL injection 60 mg (60 mg Intravenous Given 01/03/21 1822)   Initial Impression / Assessment and Plan / ED Course  I have reviewed the triage vital signs and the nursing notes.  Pertinent labs & imaging results that were available during my care of the patient were reviewed by me and considered in my medical decision making (see chart for details).  Tyger Wichman is a 72yoM with PMH and HPI as listed above who presents to ED for SOB. Differential includes COPD exacerbation, pneumonia.   Do not believe this is pneumonia. CXR without consolidation or infectious changes. WBC wnl. No increased sputum production or fevers.  Will admit for COPD exacerbation as he has failed outpatient treatment. Give continuous neb and solu-medrol stress dose.   Final Clinical Impressions(s) / ED Diagnoses   Final diagnoses:  None    New Prescriptions New Prescriptions   No medications on file

## 2021-01-03 NOTE — ED Provider Notes (Signed)
[facility] EMERGENCY DEPARTMENT Provider Note   CSN: [id number] Arrival date & time: 01/03/21  1626     History Chief Complaint  Patient presents with  . Shortness of Breath    Nathaniel Garcia is a 75 y.o. male.  HPI      Nathaniel Garcia is a 75 y.o. male, with a history of COPD, hyperlipidemia, stroke, presenting to the ED with shortness of breath worsening for the last week.  Patient was seen by pulmonology today in the office, treated with nebulizers, improved, and was sent home. He received 20 mg prednisone in the office and was prescribed 10 mg prednisone daily. When he was on his way to his home from his vehicle, he began to have recurrence of his shortness of breath and wheezing.  He called the pulmonology office and they told him to come to the emergency department. Patient adds he restarted smoking cigarettes just prior to the start of his symptoms.  Denies fever/chills, increased sputum production, chest pain, dizziness, syncope, abdominal pain, nausea/vomiting, or any other complaints.   Past Medical History:  Diagnosis Date  . COPD (chronic obstructive pulmonary disease) (HCC)   . Hematuria   . Hyperlipidemia   . Stroke [facility])     Patient Active Problem List   Diagnosis Date Noted  . Acute exacerbation of chronic obstructive pulmonary disease (COPD) (HCC) 10/31/2020  . COPD exacerbation (HCC) 10/29/2020  . Acute respiratory failure with hypoxia (HCC) 10/29/2020  . History of stroke 10/29/2020  . Aortic atherosclerosis (HCC) 12/30/2018  . Benign hematuria 09/18/2016  . Weakness of left hand 07/02/2016  . Lipid screening 01/03/2016  . Cerebral vascular disease 09/14/2015  . Carotid artery narrowing 08/15/2015  . Bilateral carotid artery stenosis 08/15/2015  . Bradycardia 08/01/2015  . H/O transient cerebral ischemia 08/01/2015  . Combined fat and carbohydrate induced hyperlipemia 08/01/2015  . Breathlessness on exertion  07/30/2015  . Absolute anemia 04/03/2015  . 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  . Cerebral vascular accident (HCC) 04/03/2015  . HLD (hyperlipidemia) 04/03/2015  . Blood glucose elevated 04/03/2015  . Lung nodule, solitary 04/03/2015  . Non compliance with medical treatment 04/03/2015  . Lung nodule, multiple 04/03/2015  . Cerebral infarction (HCC) 04/03/2015  . Abnormal lung field 04/03/2015  . Alcohol dependence (HCC) 04/03/2015    Past Surgical History:  Procedure Laterality Date  . COLONOSCOPY  2011   normal- MD docs       Family History  Problem Relation Age of Onset  . Prostate cancer Neg Hx     Social History   Tobacco Use  . Smoking status: Former Smoker    Packs/day: 0.02    Years: 60.00    Pack years: 1.20    Types: Cigarettes    Quit date: 10/26/2020    Years since quitting: 0.1  . Smokeless tobacco: Never Used  . Tobacco comment: [place] smoking cessation information provided  Vaping Use  . Vaping Use: Never used  Substance Use Topics  . Alcohol use: Yes    Alcohol/week: 1.0 standard drink    Types: 1 Cans of beer per week  . Drug use: Yes    Comment: marijuana    Home Medications Prior to Admission medications   Medication Sig Start Date End Date Taking? Authorizing Provider  albuterol (VENTOLIN HFA) 108 (90 Base) MCG/ACT inhaler Inhale 2 puffs into the lungs every 6 (six) hours as needed for wheezing or shortness of breath. 05/31/20   Duanne Limerick,  MD  atorvastatin (LIPITOR) 80 MG tablet Take 1 tablet (80 mg total) by mouth daily. 05/31/20   Duanne Limerick, MD  buPROPion (WELLBUTRIN XL) 150 MG 24 hr tablet Take 1 tablet (150 mg total) by mouth daily. 11/05/20 11/05/21  Duanne Limerick, MD  clopidogrel (PLAVIX) 75 MG tablet Take 1 tablet (75 mg total) by mouth daily. 05/31/20   Duanne Limerick, MD  clotrimazole-betamethasone (LOTRISONE) cream Apply 1 application topically 2 (two) times daily. 12/31/20   Duanne Limerick, MD  montelukast  (SINGULAIR) 10 MG tablet Take 1 tablet (10 mg total) by mouth at bedtime. 10/31/20   Marguerita Merles Latif, DO  tamsulosin (FLOMAX) 0.4 MG CAPS capsule Take 1 capsule (0.4 mg total) by mouth daily. 02/13/20   McGowan, Shannon A, PA-C  TRELEGY ELLIPTA 100-62.5-25 MCG/INH AEPB Inhale 1 puff into the lungs daily. 10/31/20   Merlene Laughter, DO    Allergies    Patient has no known allergies.  Review of Systems   Review of Systems  Constitutional: Negative for chills and fever.  Respiratory: Positive for shortness of breath. Negative for cough.   Cardiovascular: Negative for chest pain and leg swelling.  Gastrointestinal: Negative for abdominal pain, diarrhea, nausea and vomiting.  Neurological: Negative for dizziness, syncope and weakness.  All other systems reviewed and are negative.   Physical Exam Updated Vital Signs BP (!) 169/70   Pulse 92   Temp 98.1 F (36.7 C) (Oral)   Resp (!) 39   SpO2 98%   Physical Exam Vitals and nursing note reviewed.  Constitutional:      General: He is not in acute distress.    Appearance: He is well-developed. He is not diaphoretic.  HENT:     Head: Normocephalic and atraumatic.     Mouth/Throat:     Mouth: Mucous membranes are moist.     Pharynx: Oropharynx is clear.  Eyes:     Conjunctiva/sclera: Conjunctivae normal.  Cardiovascular:     Rate and Rhythm: Normal rate and regular rhythm.     Pulses: Normal pulses.          Radial pulses are 2+ on the right side and 2+ on the left side.       Posterior tibial pulses are 2+ on the right side and 2+ on the left side.     Heart sounds: Normal heart sounds.     Comments: Tactile temperature in the extremities appropriate and equal bilaterally. Pulmonary:     Effort: No respiratory distress.     Breath sounds: Wheezing present.     Comments: Tachypnea, increased work of breathing, wheezing in all fields. Abdominal:     Palpations: Abdomen is soft.     Tenderness: There is no abdominal  tenderness. There is no guarding.  Musculoskeletal:     Cervical back: Neck supple.     Right lower leg: No edema.     Left lower leg: No edema.  Lymphadenopathy:     Cervical: No cervical adenopathy.  Skin:    General: Skin is warm and dry.  Neurological:     Mental Status: He is alert.  Psychiatric:        Mood and Affect: Mood and affect normal.        Speech: Speech normal.        Behavior: Behavior normal.     ED Results / Procedures / Treatments   Labs (all labs ordered are listed, but only abnormal results are displayed) Labs  Reviewed  BASIC METABOLIC PANEL - Abnormal; Notable for the following components:      Result Value   Glucose, Bld 124 (*)    All other components within normal limits  CBC - Abnormal; Notable for the following components:   RBC 4.10 (*)    MCV 105.1 (*)    All other components within normal limits  POC SARS CORONAVIRUS 2 AG -  ED    EKG EKG Interpretation  Date/Time:  Thursday January 03 2021 16:39:14 EST Ventricular Rate:  97 PR Interval:  124 QRS Duration: 72 QT Interval:  366 QTC Calculation: 464 R Axis:   89 Text Interpretation: Normal sinus rhythm T wave abnormality, consider inferior ischemia Prolonged QT Abnormal ECG No significant change since last tracing Confirmed by Jacalyn Lefevre [phone]) on 01/03/2021 5:42:31 PM   Radiology DG Chest 2 View  Result Date: 01/03/2021 CLINICAL DATA:  Shortness of breath EXAM: CHEST - 2 VIEW COMPARISON:  October 29, 2020 chest radiograph; chest CT July 05, 2020 FINDINGS: Lungs are hyperexpanded. Underlying emphysematous change again noted with scarring in the upper lobes, more severe on the left than on the right, stable. There is no appreciable edema or airspace opacity. The heart size is normal. The pulmonary vascularity reflects the underlying emphysema. No adenopathy. No bone lesions. Skin staples overlie the upper chest. IMPRESSION: Underlying emphysematous change with areas of scarring,  stable. No edema or airspace opacity. Stable cardiac silhouette. Pulmonary vascularity reflects underlying emphysema. No adenopathy. Emphysema (ICD10-J43.9). Electronically Signed   By: Bretta Bang III M.D.   On: 01/03/2021 17:05    Procedures Procedures   Medications Ordered in ED Medications  ipratropium-albuterol (DUONEB) 0.5-2.5 (3) MG/3ML nebulizer solution 3 mL (3 mLs Nebulization Given 01/03/21 1813)  methylPREDNISolone sodium succinate (SOLU-MEDROL) 125 mg/2 mL injection 60 mg (60 mg Intravenous Given 01/03/21 1822)    ED Course  I have reviewed the triage vital signs and the nursing notes.  Pertinent labs & imaging results that were available during my care of the patient were reviewed by me and considered in my medical decision making (see chart for details).  Clinical Course as of 01/03/21 2030  Thu Jan 03, 2021  1912 Spoke with Dr. Julian Reil, hospitalist. Agrees to admit the patient. [SJ]    Clinical Course User Index [SJ] Adlene Adduci C, PA-C   MDM Rules/Calculators/A&P                          Patient presents with worsening shortness of breath over the last week. Afebrile, nontoxic-appearing, not tachycardic, however, patient is tachypneic, increased work of breathing, wheezes throughout all lung fields. Patient treated earlier today for shortness of breath and wheezing, improved, but then had recurrence.  This constitutes failure of outpatient therapy and admission for further management would be beneficial for the patient.    Findings and plan of care discussed with attending physician, Jacalyn Lefevre, MD. Dr. Particia Nearing personally evaluated and examined this patient.  Final Clinical Impression(s) / ED Diagnoses Final diagnoses:  COPD exacerbation [facility])    Rx / DC Orders ED Discharge Orders    None       Concepcion Living 01/03/21 2033    Jacalyn Lefevre, MD 01/03/21 2043

## 2021-01-03 NOTE — ED Triage Notes (Signed)
Patient with history of COPD complains of shortness of breath. States he was seen at PCP at 1400, given nebulized breathing treatment and steroids and told that if breathing gets worse to go to ER. Patient audibly wheezing, SpO2 100%, tachypnic, denies pain.

## 2021-01-03 NOTE — H&P (Signed)
History and Physical    Nathaniel Garcia EHO:[id number] DOB: 1945-11-29 DOA: 01/03/2021  PCP: Duanne Limerick, MD  Patient coming from: Home  I have personally briefly reviewed patient's old medical records in [facility]  Chief Complaint: SOB  HPI: Nathaniel Garcia is a 75 y.o. male with medical history significant of COPD with bullous emphysema, HLD, prior stroke.  Pt last admitted in Dec for COPD exacerbation.  Pt had quit smoking until ~1 week ago when he started smoking cigarettes again.  Since that time he has had worsening SOB, wheezing.  Symptoms persistent, worsening, now severe.  Not helped by home nebs.  Went to pulm office today.  Given steroids.  Had recurrence of symptoms on way home, sent in to ED.   ED Course: Symptoms persist in ED despite treatments.  Given IV solumedrol.  No O2 requirement this time around.   Review of Systems: As per HPI, otherwise all review of systems negative.  Past Medical History:  Diagnosis Date  . COPD (chronic obstructive pulmonary disease) (HCC)   . Hematuria   . Hyperlipidemia   . Stroke [facility])     Past Surgical History:  Procedure Laterality Date  . COLONOSCOPY  2011   normal- MD docs     reports that he quit smoking about 2 months ago. His smoking use included cigarettes. He has a 1.20 pack-year smoking history. He has never used smokeless tobacco. He reports current alcohol use of about 1.0 standard drink of alcohol per week. He reports current drug use.  No Known Allergies  Family History  Problem Relation Age of Onset  . Prostate cancer Neg Hx      Prior to Admission medications   Medication Sig Start Date End Date Taking? Authorizing Provider  albuterol (VENTOLIN HFA) 108 (90 Base) MCG/ACT inhaler Inhale 2 puffs into the lungs every 6 (six) hours as needed for wheezing or shortness of breath. 05/31/20  Yes Duanne Limerick, MD  atorvastatin (LIPITOR) 80 MG tablet Take 1 tablet (80 mg total) by  mouth daily. 05/31/20  Yes Duanne Limerick, MD  buPROPion (WELLBUTRIN XL) 150 MG 24 hr tablet Take 1 tablet (150 mg total) by mouth daily. 11/05/20 11/05/21 Yes Duanne Limerick, MD  clopidogrel (PLAVIX) 75 MG tablet Take 1 tablet (75 mg total) by mouth daily. 05/31/20  Yes Duanne Limerick, MD  clotrimazole-betamethasone (LOTRISONE) cream Apply 1 application topically 2 (two) times daily. 12/31/20  Yes Duanne Limerick, MD  montelukast (SINGULAIR) 10 MG tablet Take 1 tablet (10 mg total) by mouth at bedtime. 10/31/20  Yes Sheikh, Omair Latif, DO  predniSONE (DELTASONE) 10 MG tablet Take 1 tablet by mouth daily. 01/03/21  Yes [provider]  tamsulosin (FLOMAX) 0.4 MG CAPS capsule Take 1 capsule (0.4 mg total) by mouth daily. 02/13/20  Yes McGowan, Shannon A, PA-C  TRELEGY ELLIPTA 100-62.5-25 MCG/INH AEPB Inhale 1 puff into the lungs daily. 10/31/20  Yes Marguerita Merles [place], [place]    Physical Exam: Vitals:   01/03/21 1745 01/03/21 1815 01/03/21 1841 01/03/21 1900  BP: (!) 147/73  103/81 (!) 122/91  Pulse: 83 75 86 79  Resp: (!) 35 (!) 24 (!) 27 (!) 28  Temp:      TempSrc:      SpO2: 100% 100% 100% 99%    Constitutional: NAD, calm, comfortable Eyes: PERRL, lids and conjunctivae normal ENMT: Mucous membranes are moist. Posterior pharynx clear of any exudate or lesions.Normal dentition.  Neck: normal,  supple, no masses, no thyromegaly Respiratory: Diffuse wheezing. Cardiovascular: Regular rate and rhythm, no murmurs / rubs / gallops. No extremity edema. 2+ pedal pulses. No carotid bruits.  Abdomen: no tenderness, no masses palpated. No hepatosplenomegaly. Bowel sounds positive.  Musculoskeletal: no clubbing / cyanosis. No joint deformity upper and lower extremities. Good ROM, no contractures. Normal muscle tone.  Skin: no rashes, lesions, ulcers. No induration Neurologic: CN 2-12 grossly intact. Sensation intact, DTR normal. Strength 5/5 in all 4.  Psychiatric: Normal judgment and insight.  Alert and oriented x 3. Normal mood.    Labs on Admission: I have personally reviewed following labs and imaging studies  CBC: Recent Labs  Lab 01/03/21 1637  WBC 9.3  HGB 13.9  HCT 43.1  MCV 105.1*  PLT 190   Basic Metabolic Panel: Recent Labs  Lab 01/03/21 1637  NA 138  K 3.6  CL 105  CO2 24  GLUCOSE 124*  BUN 12  CREATININE 1.11  CALCIUM 9.2   GFR: Estimated Creatinine Clearance: 46.5 mL/min (by C-G formula based on SCr of 1.11 mg/dL). Liver Function Tests: No results for input(s): AST, ALT, ALKPHOS, BILITOT, PROT, ALBUMIN in the last 168 hours. No results for input(s): LIPASE, AMYLASE in the last 168 hours. No results for input(s): AMMONIA in the last 168 hours. Coagulation Profile: No results for input(s): INR, PROTIME in the last 168 hours. Cardiac Enzymes: No results for input(s): CKTOTAL, CKMB, CKMBINDEX, TROPONINI in the last 168 hours. BNP (last 3 results) No results for input(s): PROBNP in the last 8760 hours. HbA1C: No results for input(s): HGBA1C in the last 72 hours. CBG: No results for input(s): GLUCAP in the last 168 hours. Lipid Profile: No results for input(s): CHOL, HDL, LDLCALC, TRIG, CHOLHDL, LDLDIRECT in the last 72 hours. Thyroid Function Tests: No results for input(s): TSH, T4TOTAL, FREET4, T3FREE, THYROIDAB in the last 72 hours. Anemia Panel: No results for input(s): VITAMINB12, FOLATE, FERRITIN, TIBC, IRON, RETICCTPCT in the last 72 hours. Urine analysis:    Component Value Date/Time   COLORURINE YELLOW 05/14/2020 1043   APPEARANCEUR CLEAR 05/14/2020 1043   APPEARANCEUR Clear 07/28/2019 1558   LABSPEC 1.025 05/14/2020 1043   PHURINE 7.0 05/14/2020 1043   GLUCOSEU NEGATIVE 05/14/2020 1043   HGBUR TRACE (A) 05/14/2020 1043   BILIRUBINUR NEGATIVE 05/14/2020 1043   BILIRUBINUR Negative 07/28/2019 1558   KETONESUR NEGATIVE 05/14/2020 1043   PROTEINUR NEGATIVE 05/14/2020 1043   UROBILINOGEN 0.2 09/15/2016 1008   NITRITE NEGATIVE  05/14/2020 1043   LEUKOCYTESUR NEGATIVE 05/14/2020 1043    Radiological Exams on Admission: DG Chest 2 View  Result Date: 01/03/2021 CLINICAL DATA:  Shortness of breath EXAM: CHEST - 2 VIEW COMPARISON:  October 29, 2020 chest radiograph; chest CT July 05, 2020 FINDINGS: Lungs are hyperexpanded. Underlying emphysematous change again noted with scarring in the upper lobes, more severe on the left than on the right, stable. There is no appreciable edema or airspace opacity. The heart size is normal. The pulmonary vascularity reflects the underlying emphysema. No adenopathy. No bone lesions. Skin staples overlie the upper chest. IMPRESSION: Underlying emphysematous change with areas of scarring, stable. No edema or airspace opacity. Stable cardiac silhouette. Pulmonary vascularity reflects underlying emphysema. No adenopathy. Emphysema (ICD10-J43.9). Electronically Signed   By: Bretta Bang III M.D.   On: 01/03/2021 17:05    EKG: Independently reviewed.  Assessment/Plan Principal Problem:   COPD with acute exacerbation (HCC) Active Problems:   History of stroke   Bullous emphysema (HCC)  1. COPD exacerbation - 1. COPD pathway 2. Cont LABA, LAMA, inh steroid 3. Prednisone 4. Rocephin 5. PRN SABA 6. Emphasized importance of quitting smoking completely 1. Pt understands and agrees he needs to quit smoking, says hes committed now. 2. Cont Wellbutrin 2. Underwt - 1. Wt actually up 2kg since Dec admission 2. Lung CA screening CT was neg in Aug 2021 - next repeat is 57yr from then. 3. CXR today just shows emphysema 3. H/o Stroke - 1. Cont plavix 2. Cont statin  DVT prophylaxis: Lovenox Code Status: Full Family Communication: Family at bedside Disposition Plan: Home after respiratory status improved Consults called: None Admission status: Place in 82   GARDNER, JARED M. DO [facility]  How to contact the [facility] Attending or Consulting provider 7A - 7P or covering  provider during after hours 7P -7A, for this patient?  1. Check the care team in [facility] and look for a) attending/consulting TR[facility] provider listed and b) the [facility] team listed 2. Log into [email or web address]  Amion Physician Scheduling and messaging for groups and whole hospitals  On call and physician scheduling software for group practices, residents, hospitalists and other medical providers for call, clinic, rotation and shift schedules. On[employer] is a hospital-wide system for scheduling doctors and paging doctors on call. EasyPlot is for scientific plotting and data analysis.  [email or web address]  and use [place]'s universal password to access. If you do not have the password, please contact the hospital operator.  3. Locate the [facility] provider you are looking for under [facility] and page to a number that you can be directly reached. 4. If you still have difficulty reaching the provider, please page the [facility] (Director on Call) for the Hospitalists listed on amion for assistance.  01/03/2021, 7:55 PM

## 2021-01-03 NOTE — ED Notes (Signed)
Ambulated pt around room and to bathroom. Pt wheezing and tachypneic, although O2 sats remained between 96-98%.

## 2021-01-04 DIAGNOSIS — J441 Chronic obstructive pulmonary disease with (acute) exacerbation: Secondary | ICD-10-CM

## 2021-01-04 LAB — CBC
HCT: 39.8 % (ref 39.0–52.0)
Hemoglobin: 12.6 g/dL — ABNORMAL LOW (ref 13.0–17.0)
MCH: 33.2 pg (ref 26.0–34.0)
MCHC: 31.7 g/dL (ref 30.0–36.0)
MCV: 105 fL — ABNORMAL HIGH (ref 80.0–100.0)
Platelets: 194 10*3/uL (ref 150–400)
RBC: 3.79 MIL/uL — ABNORMAL LOW (ref 4.22–5.81)
RDW: 13.8 % (ref 11.5–15.5)
WBC: 5 10*3/uL (ref 4.0–10.5)
nRBC: 0 % (ref 0.0–0.2)

## 2021-01-04 LAB — BASIC METABOLIC PANEL
Anion gap: 11 (ref 5–15)
BUN: 13 mg/dL (ref 8–23)
CO2: 21 mmol/L — ABNORMAL LOW (ref 22–32)
Calcium: 8.8 mg/dL — ABNORMAL LOW (ref 8.9–10.3)
Chloride: 104 mmol/L (ref 98–111)
Creatinine, Ser: 1.02 mg/dL (ref 0.61–1.24)
GFR, Estimated: >60 mL/min (ref >60)
Glucose, Bld: 148 mg/dL — ABNORMAL HIGH (ref 70–99)
Potassium: 4.4 mmol/L (ref 3.5–5.1)
Sodium: 136 mmol/L (ref 135–145)

## 2021-01-04 LAB — HIV ANTIBODY (ROUTINE TESTING W REFLEX): HIV Screen 4th Generation wRfx: NONREACTIVE

## 2021-01-04 MED ORDER — DOXYCYCLINE HYCLATE 100 MG PO CAPS: 100.0000 mg | ORAL_CAPSULE | Freq: Two times a day (BID) | ORAL | 0 refills | Status: AC

## 2021-01-04 MED ORDER — FLUTICASONE FUROATE-VILANTEROL 100-25 MCG/INH IN AEPB
1.0000 | INHALATION_SPRAY | Freq: Every day | RESPIRATORY_TRACT | Status: DC
  Administered 2021-01-04: 1 via RESPIRATORY_TRACT
  Filled 2021-01-04: qty 28

## 2021-01-04 MED ORDER — PREDNISONE 20 MG PO TABS: ORAL_TABLET | ORAL | 0 refills | Status: DC

## 2021-01-04 MED ORDER — UMECLIDINIUM BROMIDE 62.5 MCG/INH IN AEPB
1.0000 | INHALATION_SPRAY | Freq: Every day | RESPIRATORY_TRACT | Status: DC
  Administered 2021-01-04: 11:00:00 1 via RESPIRATORY_TRACT
  Filled 2021-01-04: qty 7

## 2021-01-04 MED ORDER — MELATONIN 5 MG PO TABS
5.0000 mg | ORAL_TABLET | Freq: Every day | ORAL | Status: DC
  Administered 2021-01-04: 5 mg via ORAL
  Filled 2021-01-04 (×2): qty 1

## 2021-01-04 NOTE — Progress Notes (Signed)
01/04/21 1235  PT Visit Information  Last PT Received On 01/04/21  Assistance Needed +1  History of Present Illness 75 y.o. male, with a history of COPD, hyperlipidemia, stroke, presenting to the ED with shortness of breath worsening for the last week. COPD exacerbation. PMH: stroke; hyperlipidemia. currently smoking.  Precautions  Precautions None  Restrictions  Weight Bearing Restrictions No  Home Living  Family/patient expects to be discharged to: Private residence  Living Arrangements Alone  Available Help at Discharge Available PRN/intermittently  Type of Home Apartment  Home Access Level entry  Home Layout Two level;Bed/bath upstairs  Alternate Level Stairs-Number of Steps flight  Alternate Level Stairs-Rails Right  Bathroom Shower/Tub Tub/shower unit  Corporate treasurer Yes  Home Equipment None  Prior Function  Level of Independence Independent  Comments I with IADL; drives (gets worn out with walking up stairs; gets tired after walking @ 55f; recently started smoking again)  Communication  Communication No difficulties  Pain Assessment  Pain Assessment No/denies pain  Cognition  Arousal/Alertness Awake/alert  Behavior During Therapy WFL for tasks assessed/performed  Overall Cognitive Status Within Functional Limits for tasks assessed  Upper Extremity Assessment  Upper Extremity Assessment Defer to OT evaluation  Lower Extremity Assessment  Lower Extremity Assessment Generalized weakness  Cervical / Trunk Assessment  Cervical / Trunk Assessment Normal  Bed Mobility  Overal bed mobility Independent  Transfers  Overall transfer level Independent  Ambulation/Gait  Ambulation/Gait assistance Supervision  Gait Distance (Feet) 25 Feet  Assistive device None  Gait Pattern/deviations Step-through pattern;Decreased stride length  General Gait Details Guarded gait. Supervision for safety and line management. Practiced performing pursed  lip breathing while ambulating. Pt with increased fatigue following ambulation.  Gait velocity Decreased  Stairs Yes  General stair comments Verbally discussed safe stair navigation for home. Discussed limiting amount of times going up/down stairs during day to conserve energy.  Balance  Overall balance assessment Mild deficits observed, not formally tested  General Comments  General comments (skin integrity, edema, etc.) SpO2 >90% on RA throughout  Exercises  Exercises Other exercises  Other Exercises  Other Exercises Practiced incentive spirometry X5  PT - End of Session  Activity Tolerance Patient limited by fatigue  Patient left in bed;with call bell/phone within reach;with nursing/sitter in room (on stretcher in ED)  Nurse Communication Mobility status  PT Assessment  PT Recommendation/Assessment All further PT needs can be met in the next venue of care  PT Visit Diagnosis Difficulty in walking, not elsewhere classified (R26.2)  PT Problem List Decreased activity tolerance;Decreased mobility;Decreased knowledge of use of DME;Decreased knowledge of precautions;Cardiopulmonary status limiting activity  AM-PAC PT "6 Clicks" Mobility Outcome Measure (Version 2)  Help needed turning from your back to your side while in a flat bed without using bedrails? 4  Help needed moving from lying on your back to sitting on the side of a flat bed without using bedrails? 4  Help needed moving to and from a bed to a chair (including a wheelchair)? 4  Help needed standing up from a chair using your arms (e.g., wheelchair or bedside chair)? 4  Help needed to walk in hospital room? 4  Help needed climbing 3-5 steps with a railing?  3  6 Click Score 23  Consider Recommendation of Discharge To: Home with no services  PT Recommendation  Follow Up Recommendations Home health PT  PT equipment None recommended by PT  Individuals Consulted  Consulted and Agree with Results and  Recommendations Patient   Acute Rehab PT Goals  Patient Stated Goal to quit smoking and breath better  PT Goal Formulation With patient  Time For Goal Achievement 01/04/21  Potential to Achieve Goals Good  PT Time Calculation  PT Start Time (ACUTE ONLY) 0939  PT Stop Time (ACUTE ONLY) 0951  PT Time Calculation (min) (ACUTE ONLY) 12 min  PT General Charges  $$ ACUTE PT VISIT 1 Visit  PT Evaluation  $PT Eval Low Complexity 1 Low  Written Expression  Dominant Hand Right    Pt admitted secondary to problem above with deficits above. Pt limited this session secondary to fatigue. Practiced pursed lip breathing during mobility tasks and reviewed energy conservation techniques. Pt likely do d/c home today, and recommending HHPT follow up. No further skilled PT needs. Will sign off. If needs change, please re-consult.   Reuel Derby, PT, DPT  Acute Rehabilitation Services  Pager: [phone] Office: [phone]

## 2021-01-04 NOTE — ED Notes (Signed)
Pt resting quietly in bed. NAD present.

## 2021-01-04 NOTE — Discharge Summary (Signed)
Physician Discharge Summary  Nathaniel Garcia KBT:[id number] DOB: 08/29/1946 DOA: 01/03/2021  PCP: Duanne Limerick, MD  Admit date: 01/03/2021 Discharge date: 01/04/2021  Admitted From: home Disposition:  home  Recommendations for Outpatient Follow-up:  1. Follow up with PCP in 1-2 weeks  Home Health: PT Equipment/Devices: none  Discharge Condition: stable CODE STATUS: Full code Diet recommendation: regular  HPI: Per admitting MD, 75 year old male with history of COPD, emphysema, hyperlipidemia, prior stroke who comes into the hospital with shortness of breath.  He has a history of tobacco use, was admitted to the hospital with COPD exacerbation and upon leaving decided to quit smoking, but unfortunately started smoking again about a week ago.  Ever since he started smoking again he has noticed his breathing is gotten worse, saw his primary pulmonologist in [place] was given steroids I presume, his home nebulizers did not improve his breathing and decided to come to the hospital.  Hospital Course / Discharge diagnoses: Principal Problem Acute hypoxic respiratory failure due to COPD exacerbation -patient was admitted to the hospital with acute hypoxic respiratory failure in the setting of COPD exacerbation.  Unfortunately about a week ago he started to smoke again which likely triggered his current episode.  He was started on steroids, nebulizers, antibiotics given productive cough.  He improved with above-mentioned treatment, his breathing is returned to baseline, his wheezing resolved, he is able to ambulate maintaining good oxygen saturations on room air.  He will be discharged home in stable condition, was consulted for ongoing tobacco cessation and to follow-up with his pulmonologist.  He will be placed on 5 additional days of antibiotics as well as a 10-day steroid taper  Active Problems History of CVA -Continue Plavix, statin Tobacco use -Reports that he is determined to  quit this time around  Sepsis ruled out   Discharge Instructions   Allergies as of 01/04/2021   No Known Allergies     Medication List    TAKE these medications   albuterol 108 (90 Base) MCG/ACT inhaler Commonly known as: VENTOLIN HFA Inhale 2 puffs into the lungs every 6 (six) hours as needed for wheezing or shortness of breath.   atorvastatin 80 MG tablet Commonly known as: LIPITOR Take 1 tablet (80 mg total) by mouth daily.   clopidogrel 75 MG tablet Commonly known as: PLAVIX Take 1 tablet (75 mg total) by mouth daily.   clotrimazole-betamethasone cream Commonly known as: Lotrisone Apply 1 application topically 2 (two) times daily.   doxycycline 100 MG capsule Commonly known as: VIBRAMYCIN Take 1 capsule (100 mg total) by mouth 2 (two) times daily for 5 days.   montelukast 10 MG tablet Commonly known as: SINGULAIR Take 1 tablet (10 mg total) by mouth at bedtime.   predniSONE 20 MG tablet Commonly known as: DELTASONE 40 mg daily for 5 days then 20 mg daily for 5 days What changed:   medication strength  how much to take  how to take this  when to take this  additional instructions   tamsulosin 0.4 MG Caps capsule Commonly known as: FLOMAX Take 1 capsule (0.4 mg total) by mouth daily.   Trelegy Ellipta 100-62.5-25 MCG/INH Aepb Generic drug: Fluticasone-Umeclidin-Vilant Inhale 1 puff into the lungs daily.            Durable Medical Equipment  (From admission, onward)         Start     Ordered   01/04/21 1039  For home use only DME 3 n 1  Once        01/04/21 1038           Consultations:  None   Procedures/Studies:  None   DG Chest 2 View  Result Date: 01/03/2021 CLINICAL DATA:  Shortness of breath EXAM: CHEST - 2 VIEW COMPARISON:  October 29, 2020 chest radiograph; chest CT July 05, 2020 FINDINGS: Lungs are hyperexpanded. Underlying emphysematous change again noted with scarring in the upper lobes, more severe on the left  than on the right, stable. There is no appreciable edema or airspace opacity. The heart size is normal. The pulmonary vascularity reflects the underlying emphysema. No adenopathy. No bone lesions. Skin staples overlie the upper chest. IMPRESSION: Underlying emphysematous change with areas of scarring, stable. No edema or airspace opacity. Stable cardiac silhouette. Pulmonary vascularity reflects underlying emphysema. No adenopathy. Emphysema (ICD10-J43.9). Electronically Signed   By: Bretta Bang III M.D.   On: 01/03/2021 17:05      Subjective: - no chest pain, shortness of breath, no abdominal pain, nausea or vomiting.   Discharge Exam: BP (!) 144/77   Pulse 88   Temp 98.1 F (36.7 C) (Oral)   Resp (!) 24   SpO2 99%   General: Pt is alert, awake, not in acute distress Cardiovascular: RRR, S1/S2 +, no rubs, no gallops Respiratory: CTA bilaterally, no wheezing, no rhonchi Abdominal: Soft, NT, ND, bowel sounds + Extremities: no edema, no cyanosis   The results of significant diagnostics from this hospitalization (including imaging, microbiology, ancillary and laboratory) are listed below for reference.     Microbiology: No results found for this or any previous visit (from the past 240 hour(s)).   Labs: Basic Metabolic Panel: Recent Labs  Lab 01/03/21 1637 01/04/21 0324  NA 138 136  K 3.6 4.4  CL 105 104  CO2 24 21*  GLUCOSE 124* 148*  BUN 12 13  CREATININE 1.11 1.02  CALCIUM 9.2 8.8*   Liver Function Tests: No results for input(s): AST, ALT, ALKPHOS, BILITOT, PROT, ALBUMIN in the last 168 hours. CBC: Recent Labs  Lab 01/03/21 1637 01/04/21 0324  WBC 9.3 5.0  HGB 13.9 12.6*  HCT 43.1 39.8  MCV 105.1* 105.0*  PLT 190 194   CBG: No results for input(s): GLUCAP in the last 168 hours. Hgb A1c No results for input(s): HGBA1C in the last 72 hours. Lipid Profile No results for input(s): CHOL, HDL, LDLCALC, TRIG, CHOLHDL, LDLDIRECT in the last 72  hours. Thyroid function studies No results for input(s): TSH, T4TOTAL, T3FREE, THYROIDAB in the last 72 hours.  Invalid input(s): FREET3 Urinalysis    Component Value Date/Time   COLORURINE YELLOW 05/14/2020 1043   APPEARANCEUR CLEAR 05/14/2020 1043   APPEARANCEUR Clear 07/28/2019 1558   LABSPEC 1.025 05/14/2020 1043   PHURINE 7.0 05/14/2020 1043   GLUCOSEU NEGATIVE 05/14/2020 1043   HGBUR TRACE (A) 05/14/2020 1043   BILIRUBINUR NEGATIVE 05/14/2020 1043   BILIRUBINUR Negative 07/28/2019 1558   KETONESUR NEGATIVE 05/14/2020 1043   PROTEINUR NEGATIVE 05/14/2020 1043   UROBILINOGEN 0.2 09/15/2016 1008   NITRITE NEGATIVE 05/14/2020 1043   LEUKOCYTESUR NEGATIVE 05/14/2020 1043    FURTHER DISCHARGE INSTRUCTIONS:   Get Medicines reviewed and adjusted: Please take all your medications with you for your next visit with your Primary MD   Laboratory/radiological data: Please request your Primary MD to go over all hospital tests and procedure/radiological results at the follow up, please ask your Primary MD to get all Hospital records sent to his/her office.  In some cases, they will be blood work, cultures and biopsy results pending at the time of your discharge. Please request that your primary care M.D. goes through all the records of your hospital data and follows up on these results.   Also Note the following: If you experience worsening of your admission symptoms, develop shortness of breath, life threatening emergency, suicidal or homicidal thoughts you must seek medical attention immediately by calling 911 or calling your MD immediately  if symptoms less severe.   You must read complete instructions/literature along with all the possible adverse reactions/side effects for all the Medicines you take and that have been prescribed to you. Take any new Medicines after you have completely understood and accpet all the possible adverse reactions/side effects.    Do not drive when taking  Pain medications or sleeping medications (Benzodaizepines)   Do not take more than prescribed Pain, Sleep and Anxiety Medications. It is not advisable to combine anxiety,sleep and pain medications without talking with your primary care practitioner   Special Instructions: If you have smoked or chewed Tobacco  in the last 2 yrs please stop smoking, stop any regular Alcohol  and or any Recreational drug use.   Wear Seat belts while driving.   Please note: You were cared for by a hospitalist during your hospital stay. Once you are discharged, your primary care physician will handle any further medical issues. Please note that NO REFILLS for any discharge medications will be authorized once you are discharged, as it is imperative that you return to your primary care physician (or establish a relationship with a primary care physician if you do not have one) for your post hospital discharge needs so that they can reassess your need for medications and monitor your lab values.  Time coordinating discharge: 35 minutes  SIGNED:  Pamella Pert, MD, PhD 01/04/2021, 11:22 AM

## 2021-01-04 NOTE — Progress Notes (Signed)
   01/04/21 1034  TOC ED Mini Assessment  TOC Time spent with patient (minutes): 30  TOC Time saved using PING (minutes): 15  PING Used in TOC Assessment Yes  Interventions which prevented an admission or readmission Home Health Consult or Services  What brought you to the Emergency Department?  Short of breath  Barriers to Discharge Continued Medical Work up    [employer]. Lucretia Roers, RN, BSN, [place] [phone] RNCM spoke with pt at bedside regarding discharge planning for Home Health Services. Offered pt medicare.gov list of home health agencies to choose from.  Pt chose [facility] to render services. Malachi Bonds of [facility]  notified. Patient made aware that [facility]  will be in contact in 24-48 hours after discharge.  DME needs identified at this time include 3n1.  Will be delivered prior to discharge home.

## 2021-01-04 NOTE — ED Notes (Signed)
OT at bedside. 

## 2021-01-04 NOTE — Progress Notes (Signed)
Occupational Therapy Evaluation Patient Details Name: Nathaniel Garcia MRN: [id number] DOB: 1946-06-18 Today's Date: 01/04/2021    History of Present Illness 75 y.o. male, with a history of COPD, hyperlipidemia, stroke, presenting to the ED with shortness of breath worsening for the last week. COPD exacerbation. PMH: stroke; hyperlipidemia. currently smoking.   Clinical Impression   Pt lives alone independently at baseline. Pt apparently has increased difficulty managing IADL tasks due to DOE. Began educating pt on pursed lip breathing and energy conservation strategies. Written information provided. Feel pt would greatly benefit from follow up with HHOT to address home set up, energy conservation and activity modification in the home to reduce risk of falls and maximize functional level of independence while living with COPD. Will follow acutely. Pt very appreciative. Ambulated on RA and with SpO2 maintaining above 93 with 2/4 DOE. Pt reports his breathing is "better" since admission.    Follow Up Recommendations  Home health OT    Equipment Recommendations  3 in 1 bedside commode (to use as showe seat)    Recommendations for Other Services       Precautions / Restrictions Precautions Precautions: None      Mobility Bed Mobility Overal bed mobility: Independent                  Transfers Overall transfer level: Independent                    Balance Overall balance assessment: Mild deficits observed, not formally tested                                         ADL either performed or assessed with clinical judgement   ADL Overall ADL's : At baseline                                       General ADL Comments: Able to complete ADL tasks however increaed DOE. Pt states he gets "exhaused" after taking a shower. climbing stairs. Began educating pt on energy conservation strategies     Vision Baseline Vision/History:  Wears glasses Wears Glasses: At all times       Perception     Praxis      Pertinent Vitals/Pain Pain Assessment: No/denies pain     Hand Dominance Right   Extremity/Trunk Assessment Upper Extremity Assessment Upper Extremity Assessment: Overall WFL for tasks assessed   Lower Extremity Assessment Lower Extremity Assessment: Defer to PT evaluation   Cervical / Trunk Assessment Cervical / Trunk Assessment: Normal   Communication Communication Communication: No difficulties   Cognition Arousal/Alertness: Awake/alert Behavior During Therapy: WFL for tasks assessed/performed Overall Cognitive Status: Within Functional Limits for tasks assessed                                     General Comments  No falls; Spo2 98 RA at rest; HR 80    Exercises Exercises: Other exercises Other Exercises Other Exercises: pursed lip breathing   Shoulder Instructions      Home Living Family/patient expects to be discharged to:: Private residence Living Arrangements: Alone Available Help at Discharge: Available PRN/intermittently Type of Home: Apartment Home Access: Level entry     Home Layout:  Two level;Bed/bath upstairs Alternate Level Stairs-Number of Steps: flight Alternate Level Stairs-Rails: Right Bathroom Shower/Tub: Chief Strategy Officer: Standard Bathroom Accessibility: Yes How Accessible: Accessible via walker Home Equipment: None          Prior Functioning/Environment Level of Independence: Independent        Comments: I with IADL; drives (gets worn out with walking up stairs; gets tired after walking @ 26ft; recently started smoking again)        OT Problem List: Decreased activity tolerance;Decreased knowledge of use of DME or AE;Cardiopulmonary status limiting activity      OT Treatment/Interventions: Self-care/ADL training;Therapeutic exercise;Energy conservation;DME and/or AE instruction;Therapeutic activities;Patient/family  education    OT Goals(Current goals can be found in the care plan section) Acute Rehab OT Goals Patient Stated Goal: to quit smoking and breath better OT Goal Formulation: With patient Time For Goal Achievement: 01/18/21 Potential to Achieve Goals: Good  OT Frequency: Min 2X/week   Barriers to D/C:            Co-evaluation              AM-PAC OT "6 Clicks" Daily Activity     Outcome Measure Help from another person eating meals?: None Help from another person taking care of personal grooming?: None Help from another person toileting, which includes using toliet, bedpan, or urinal?: None Help from another person bathing (including washing, rinsing, drying)?: None Help from another person to put on and taking off regular upper body clothing?: None Help from another person to put on and taking off regular lower body clothing?: None 6 Click Score: 24   End of Session Equipment Utilized During Treatment: Gait belt Nurse Communication: Mobility status;Other (comment) (DC needs)  Activity Tolerance: Patient tolerated treatment well Patient left: in bed;with call bell/phone within reach  OT Visit Diagnosis: Unsteadiness on feet (R26.81)                Time: 0900-0930 OT Time Calculation (min): 30 min Charges:  OT General Charges $OT Visit: 1 Visit OT Evaluation $OT Eval Moderate Complexity: 1 Mod OT Treatments $Self Care/Home Management : 8-22 mins  Luisa Dago, OT/L   Acute OT Clinical Specialist Acute Rehabilitation Services Pager [phone] Office [phone]   [facility] 01/04/2021, 11:14 AM

## 2021-01-04 NOTE — Discharge Planning (Signed)
Pt declined DME 3n1.  States is restroom is only steps away in his apartment.  RNCM canceled delivery with [facility].

## 2021-01-04 NOTE — ED Notes (Signed)
Patient verbalizes understanding of discharge instructions. Opportunity for questioning and answers were provided. Armband removed by staff, pt discharged from ED via wheelchair to lobby to return home with family.  

## 2021-01-04 NOTE — ED Notes (Signed)
Pt slightly SOB after ambulating with OT. VSS and WNL. Pt medicated at this time. Needs met.

## 2021-01-04 NOTE — ED Notes (Addendum)
Pt requesting something to aid in sleep, this RN requested provider to be paged by secretary, awaiting return call.

## 2021-01-04 NOTE — ED Notes (Signed)
Breakfast Ordered 

## 2021-01-09 ENCOUNTER — Telehealth: Payer: Self-pay | Admitting: Family Medicine

## 2021-01-09 NOTE — Telephone Encounter (Signed)
Home Health Verbal Orders - Caller/Agency: Deno Etienne, from [facility] Number: [phone] Requesting OT/PT/Skilled Nursing/Social Work/Speech Therapy: Delay in start of care until 01/11/2021 due to staff availability  Frequency:

## 2021-01-10 NOTE — Telephone Encounter (Signed)
Spoke to [employer]- gave okay for PT and OT

## 2021-01-12 ENCOUNTER — Other Ambulatory Visit: Payer: Self-pay | Admitting: Urology

## 2021-01-22 DIAGNOSIS — Z01818 Encounter for other preprocedural examination: Secondary | ICD-10-CM | POA: Diagnosis not present

## 2021-01-22 DIAGNOSIS — R0600 Dyspnea, unspecified: Secondary | ICD-10-CM | POA: Diagnosis not present

## 2021-01-22 DIAGNOSIS — J449 Chronic obstructive pulmonary disease, unspecified: Secondary | ICD-10-CM | POA: Diagnosis not present

## 2021-01-24 DIAGNOSIS — J449 Chronic obstructive pulmonary disease, unspecified: Secondary | ICD-10-CM | POA: Diagnosis not present

## 2021-02-24 DIAGNOSIS — J449 Chronic obstructive pulmonary disease, unspecified: Secondary | ICD-10-CM | POA: Diagnosis not present

## 2021-02-27 ENCOUNTER — Telehealth: Payer: Self-pay

## 2021-02-27 ENCOUNTER — Ambulatory Visit: Payer: Self-pay

## 2021-02-27 NOTE — Telephone Encounter (Signed)
ok 

## 2021-02-27 NOTE — Telephone Encounter (Signed)
Copied from CRM [id number]. Topic: General - Other >> Feb 27, 2021  3:08 PM Pawlus, Maxine Glenn A wrote: Reason for CRM: Pt requested to speak directly with Dr Yetta Barre or her nurse, please call back.

## 2021-02-27 NOTE — Telephone Encounter (Signed)
Pt. States he was in the hospital 3 weeks ago and since then has noticed some confusion. Not knowing what day it is. Feels like his medications are causing this. No other symptoms. Appointment made.  Reason for Disposition . [1] Acting confused (e.g., disoriented, slurred speech) AND [2] brief (now gone)  Answer Assessment - Initial Assessment Questions 1. LEVEL OF CONSCIOUSNESS: "How is he (she, the patient) acting right now?" (e.g., alert-oriented, confused, lethargic, stuporous, comatose)     Alert oriented 2. ONSET: "When did the confusion start?"  (minutes, hours, days)     3 weeks ago 3. PATTERN "Does this come and go, or has it been constant since it started?"  "Is it present now?"     Comes and goes 4. ALCOHOL or DRUGS: "Has he been drinking alcohol or taking any drugs?"      No 5. NARCOTIC MEDICATIONS: "Has he been receiving any narcotic medications?" (e.g., morphine, Vicodin)     No 6. CAUSE: "What do you think is causing the confusion?"      Medications 7. OTHER SYMPTOMS: "Are there any other symptoms?" (e.g., difficulty breathing, headache, fever, weakness)     No  Protocols used: CONFUSION - DELIRIUM-A-AH

## 2021-02-28 ENCOUNTER — Encounter: Payer: Self-pay | Admitting: Family Medicine

## 2021-02-28 ENCOUNTER — Other Ambulatory Visit: Payer: Self-pay

## 2021-02-28 ENCOUNTER — Ambulatory Visit (INDEPENDENT_AMBULATORY_CARE_PROVIDER_SITE_OTHER): Payer: Medicare HMO | Admitting: Family Medicine

## 2021-02-28 VITALS — BP 148/70 | HR 66 | Ht 72.0 in | Wt 132.0 lb

## 2021-02-28 DIAGNOSIS — I10 Essential (primary) hypertension: Secondary | ICD-10-CM

## 2021-02-28 MED ORDER — HYDROCHLOROTHIAZIDE 12.5 MG PO TABS
12.5000 mg | ORAL_TABLET | Freq: Every day | ORAL | 3 refills | Status: DC
Start: 2021-02-28 — End: 2021-03-11

## 2021-02-28 NOTE — Patient Instructions (Signed)

## 2021-02-28 NOTE — Telephone Encounter (Signed)
Called and spoke to pt- go to ER if gets worse with cough/ confusion before surgery

## 2021-02-28 NOTE — Progress Notes (Signed)
Date:  02/28/2021   Name:  Nathaniel Garcia   DOB:  1946/11/06   MRN:  [id number]   Chief Complaint: Memory Loss (X3 weeks, Confusion, trying to quit smoking think he may having a withdrawal, wake up in morning doesn't know what day it is, x 1 month stopped smoking )  Hypertension This is a chronic problem. The current episode started more than 1 year ago. The problem has been gradually improving since onset. The problem is controlled. Pertinent negatives include no anxiety, blurred vision, chest pain, headaches, malaise/fatigue, neck pain, orthopnea, palpitations, peripheral edema, PND, shortness of breath or sweats. There are no associated agents to hypertension.    Lab Results  Component Value Date   CREATININE 1.02 01/04/2021   BUN 13 01/04/2021   NA 136 01/04/2021   K 4.4 01/04/2021   CL 104 01/04/2021   CO2 21 (L) 01/04/2021   Lab Results  Component Value Date   CHOL 177 06/29/2018   HDL 54 06/29/2018   LDLCALC 110 (H) 06/29/2018   TRIG 64 06/29/2018   CHOLHDL 3.3 06/29/2018   Lab Results  Component Value Date   TSH 1.650 05/30/2019   No results found for: HGBA1C Lab Results  Component Value Date   WBC 5.0 01/04/2021   HGB 12.6 (L) 01/04/2021   HCT 39.8 01/04/2021   MCV 105.0 (H) 01/04/2021   PLT 194 01/04/2021   Lab Results  Component Value Date   ALT 9 10/29/2020   AST 21 10/29/2020   ALKPHOS 60 10/29/2020   BILITOT 1.4 (H) 10/29/2020     Review of Systems  Constitutional: Negative for chills, fever and malaise/fatigue.  HENT: Negative for drooling, ear discharge, ear pain and sore throat.   Eyes: Negative for blurred vision.  Respiratory: Negative for cough, shortness of breath and wheezing.   Cardiovascular: Negative for chest pain, palpitations, orthopnea, leg swelling and PND.  Gastrointestinal: Negative for abdominal pain, blood in stool, constipation, diarrhea and nausea.  Endocrine: Negative for polydipsia.  Genitourinary: Negative for  dysuria, frequency, hematuria and urgency.  Musculoskeletal: Negative for back pain, myalgias and neck pain.  Skin: Negative for rash.  Allergic/Immunologic: Negative for environmental allergies.  Neurological: Negative for dizziness and headaches.  Hematological: Does not bruise/bleed easily.  Psychiatric/Behavioral: Negative for suicidal ideas. The patient is not nervous/anxious.     Patient Active Problem List   Diagnosis Date Noted  . COPD with acute exacerbation (HCC) 01/03/2021  . Bullous emphysema (HCC) 01/03/2021  . Acute exacerbation of chronic obstructive pulmonary disease (COPD) (HCC) 10/31/2020  . COPD exacerbation (HCC) 10/29/2020  . Acute respiratory failure with hypoxia (HCC) 10/29/2020  . History of stroke 10/29/2020  . Aortic atherosclerosis (HCC) 12/30/2018  . Benign hematuria 09/18/2016  . Weakness of left hand 07/02/2016  . Lipid screening 01/03/2016  . Cerebral vascular disease 09/14/2015  . Carotid artery narrowing 08/15/2015  . Bilateral carotid artery stenosis 08/15/2015  . Bradycardia 08/01/2015  . H/O transient cerebral ischemia 08/01/2015  . Combined fat and carbohydrate induced hyperlipemia 08/01/2015  . Breathlessness on exertion 07/30/2015  . Absolute anemia 04/03/2015  . 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  . Cerebral vascular accident (HCC) 04/03/2015  . HLD (hyperlipidemia) 04/03/2015  . Blood glucose elevated 04/03/2015  . Lung nodule, solitary 04/03/2015  . Non compliance with medical treatment 04/03/2015  . Lung nodule, multiple 04/03/2015  . Cerebral infarction (HCC) 04/03/2015  . Abnormal lung field 04/03/2015    No Known Allergies  Past Surgical History:  Procedure Laterality Date  . COLONOSCOPY  2011   normal- MD docs    Social History   Tobacco Use  . Smoking status: Former Smoker    Packs/day: 0.02    Years: 60.00    Pack years: 1.20    Types: Cigarettes    Quit date: 10/26/2020    Years since quitting: 0.3  .  Smokeless tobacco: Never Used  . Tobacco comment: [place] smoking cessation information provided  Vaping Use  . Vaping Use: Never used  Substance Use Topics  . Alcohol use: Yes    Alcohol/week: 1.0 standard drink    Types: 1 Cans of beer per week  . Drug use: Yes    Comment: marijuana     Medication list has been reviewed and updated.  Current Meds  Medication Sig  . albuterol (VENTOLIN HFA) 108 (90 Base) MCG/ACT inhaler Inhale 2 puffs into the lungs every 6 (six) hours as needed for wheezing or shortness of breath.  Marland Kitchen atorvastatin (LIPITOR) 80 MG tablet Take 1 tablet (80 mg total) by mouth daily.  . clopidogrel (PLAVIX) 75 MG tablet Take 1 tablet (75 mg total) by mouth daily.  . clotrimazole-betamethasone (LOTRISONE) cream Apply 1 application topically 2 (two) times daily.  . montelukast (SINGULAIR) 10 MG tablet Take 1 tablet (10 mg total) by mouth at bedtime.  . tamsulosin (FLOMAX) 0.4 MG CAPS capsule TAKE 1 CAPSULE(0.4 MG) BY MOUTH DAILY    PHQ 2/9 Scores 02/28/2021 12/31/2020 11/05/2020 06/27/2020  PHQ - 2 Score 0 0 0 0  PHQ- 9 Score 4 0 2 -    GAD 7 : Generalized Anxiety Score 02/28/2021 12/31/2020 11/05/2020 05/31/2020  Nervous, Anxious, on Edge 0 0 1 0  Control/stop worrying 0 0 0 0  Worry too much - different things 0 0 0 0  Trouble relaxing 0 0 0 0  Restless 0 0 0 0  Easily annoyed or irritable 0 0 0 0  Afraid - awful might happen 0 0 0 0  Total GAD 7 Score 0 0 1 0  Anxiety Difficulty - - Not difficult at all -    BP Readings from Last 3 Encounters:  02/28/21 (!) 148/70  01/04/21 108/75  12/31/20 138/80    Physical Exam  Wt Readings from Last 3 Encounters:  02/28/21 132 lb (59.9 kg)  12/31/20 126 lb (57.2 kg)  11/05/20 122 lb (55.3 kg)    BP (!) 148/70   Pulse 66   Ht 6' (1.829 m)   Wt 132 lb (59.9 kg)   SpO2 99%   BMI 17.90 kg/m   Assessment and Plan: 1. Primary hypertension Chronic.  Uncontrolled.  Stable.  Patient again has an elevated  systolic reading.  We will initiate hydrochlorothiazide 12.5 mg once a day. - hydrochlorothiazide (HYDRODIURIL) 12.5 MG tablet; Take 1 tablet (12.5 mg total) by mouth daily.  Dispense: 90 tablet; Refill: 3  As for reported memory loss patient has had excellent recall of events the prior day as well as remote memory.  The 6 CIT was normal, his PHQ and gad scores were acceptable I do not think that we need to proceed with neurology at this time the patient has been informed that continuance of his smoking cessation is key to his maintenance.

## 2021-03-04 ENCOUNTER — Ambulatory Visit: Payer: Self-pay | Admitting: Family Medicine

## 2021-03-04 DIAGNOSIS — H524 Presbyopia: Secondary | ICD-10-CM | POA: Diagnosis not present

## 2021-03-11 ENCOUNTER — Ambulatory Visit: Payer: Medicare HMO | Admitting: Family Medicine

## 2021-03-11 ENCOUNTER — Encounter (HOSPITAL_COMMUNITY): Payer: Self-pay

## 2021-03-11 ENCOUNTER — Ambulatory Visit (HOSPITAL_COMMUNITY)
Admission: EM | Admit: 2021-03-11 | Discharge: 2021-03-11 | Disposition: A | Discharge status: Home / Self Care | Payer: Medicare HMO | Attending: Family Medicine | Admitting: Family Medicine

## 2021-03-11 ENCOUNTER — Other Ambulatory Visit: Payer: Self-pay

## 2021-03-11 DIAGNOSIS — L299 Pruritus, unspecified: Secondary | ICD-10-CM | POA: Diagnosis not present

## 2021-03-11 MED ORDER — PERMETHRIN 5 % EX CREA: TOPICAL_CREAM | CUTANEOUS | 1 refills | Status: DC

## 2021-03-11 MED ORDER — PREDNISONE 20 MG PO TABS
40.0000 mg | ORAL_TABLET | Freq: Every day | ORAL | 0 refills | Status: DC
Start: 2021-03-11 — End: 2021-04-11

## 2021-03-11 NOTE — ED Triage Notes (Signed)
Pt reports redness and itchiness in hands, arms and scalp x 2 days. Denies changes in medications, food, detergents.

## 2021-03-13 NOTE — ED Provider Notes (Signed)
[facility]   [id number] 03/11/21 Arrival Time: 0831  ASSESSMENT & PLAN:  1. Itching    Unclear etiology. Will cover for both allergic rxn and scabies. Discussed. Begin: Meds ordered this encounter  Medications  . permethrin (ELIMITE) 5 % cream    Sig: Apply from neck down before bed then wash off in the morning. May repeat in one week.    Dispense:  60 g    Refill:  1  . predniSONE (DELTASONE) 20 MG tablet    Sig: Take 2 tablets (40 mg total) by mouth daily.    Dispense:  10 tablet    Refill:  0   Will follow up with PCP or here if worsening or failing to improve as anticipated. Reviewed expectations re: course of current medical issues. Questions answered. Outlined signs and symptoms indicating need for more acute intervention. Patient verbalized understanding. After Visit Summary given.   SUBJECTIVE:  Nathaniel Garcia is a 75 y.o. male who presents with a skin complaint. Reports redness and itching over hands, axilla, scalp; some around waist. No new exposures. First noted 2 d ago; stable "but itching like crazy". No h/o similar. No recent travel.    OBJECTIVE: Vitals:   03/11/21 0935  BP: (!) 153/74  Pulse: 68  Resp: 17  Temp: 98.1 F (36.7 C)  TempSrc: Oral  SpO2: 98%    General appearance: alert; no distress HEENT: [place]; AT Neck: supple with FROM Lungs: clear to auscultation bilaterally Heart: regular rate and rhythm Extremities: no edema; moves all extremities normally Skin: warm and dry; mild redness of hands Psychological: alert and cooperative; normal mood and affect  No Known Allergies  Past Medical History:  Diagnosis Date  . COPD (chronic obstructive pulmonary disease) (HCC)   . Hematuria   . Hyperlipidemia   . Stroke [facility])    Social History   Socioeconomic History  . Marital status: Legally Separated    Spouse name: Not on file  . Number of children: 3  . Years of education: some college  . Highest education level:  12th grade  Occupational History  . Occupation: Retired  Tobacco Use  . Smoking status: Former Smoker    Packs/day: 0.02    Years: 60.00    Pack years: 1.20    Types: Cigarettes    Quit date: 10/26/2020    Years since quitting: 0.3  . Smokeless tobacco: Never Used  . Tobacco comment: [place] smoking cessation information provided  Vaping Use  . Vaping Use: Never used  Substance and Sexual Activity  . Alcohol use: Yes    Alcohol/week: 1.0 standard drink    Types: 1 Cans of beer per week  . Drug use: Yes    Comment: marijuana  . Sexual activity: Never  Other Topics Concern  . Not on file  Social History Narrative   Pt lives alone.   Social Determinants of Health   Financial Resource Strain: Low Risk   . Difficulty of Paying Living Expenses: Not hard at all  Food Insecurity: No Food Insecurity  . Worried About Programme researcher, broadcasting/film/video in the Last Year: Never true  . Ran Out of Food in the Last Year: Never true  Transportation Needs: No Transportation Needs  . Lack of Transportation (Medical): No  . Lack of Transportation (Non-Medical): No  Physical Activity: Inactive  . Days of Exercise per Week: 0 days  . Minutes of Exercise per Session: 0 min  Stress: No Stress Concern Present  .  Feeling of Stress : Not at all  Social Connections: Socially Isolated  . Frequency of Communication with Friends and Family: More than three times a week  . Frequency of Social Gatherings with Friends and Family: Three times a week  . Attends Religious Services: Never  . Active Member of Clubs or Organizations: No  . Attends Banker Meetings: Never  . Marital Status: Separated  Intimate Partner Violence: Not At Risk  . Fear of Current or Ex-Partner: No  . Emotionally Abused: No  . Physically Abused: No  . Sexually Abused: No   Family History  Problem Relation Age of Onset  . Prostate cancer Neg Hx    Past Surgical History:  Procedure Laterality Date  . COLONOSCOPY  2011    normal- MD docs     Mardella Layman, MD 03/13/21 [phone]

## 2021-03-15 ENCOUNTER — Other Ambulatory Visit: Payer: Self-pay

## 2021-03-15 ENCOUNTER — Encounter: Payer: Self-pay | Admitting: Family Medicine

## 2021-03-15 ENCOUNTER — Ambulatory Visit (INDEPENDENT_AMBULATORY_CARE_PROVIDER_SITE_OTHER): Payer: Medicare HMO | Admitting: Family Medicine

## 2021-03-15 VITALS — BP 130/70 | HR 64 | Ht 72.0 in | Wt 134.0 lb

## 2021-03-15 DIAGNOSIS — R21 Rash and other nonspecific skin eruption: Secondary | ICD-10-CM

## 2021-03-15 NOTE — Progress Notes (Signed)
Date:  03/15/2021   Name:  Nathaniel Garcia   DOB:  19-Jun-1946   MRN:  [id number]   Chief Complaint: Follow-up (Itching- finished prednisone, only using Elimite when itching.)  Hypertension This is a chronic problem. The current episode started more than 1 year ago. The problem has been gradually improving since onset. The problem is controlled. Pertinent negatives include no anxiety, blurred vision, chest pain, headaches, malaise/fatigue, neck pain, orthopnea, palpitations, peripheral edema, PND, shortness of breath or sweats. There are no associated agents to hypertension. There are no known risk factors for coronary artery disease. Treatments tried: stopped HCTZ. The current treatment provides mild improvement. There are no compliance problems.  There is no history of angina, kidney disease, CAD/MI, CVA, heart failure, left ventricular hypertrophy, PVD or retinopathy. There is no history of chronic renal disease, a hypertension causing med or renovascular disease.    Lab Results  Component Value Date   CREATININE 1.02 01/04/2021   BUN 13 01/04/2021   NA 136 01/04/2021   K 4.4 01/04/2021   CL 104 01/04/2021   CO2 21 (L) 01/04/2021   Lab Results  Component Value Date   CHOL 177 06/29/2018   HDL 54 06/29/2018   LDLCALC 110 (H) 06/29/2018   TRIG 64 06/29/2018   CHOLHDL 3.3 06/29/2018   Lab Results  Component Value Date   TSH 1.650 05/30/2019   No results found for: HGBA1C Lab Results  Component Value Date   WBC 5.0 01/04/2021   HGB 12.6 (L) 01/04/2021   HCT 39.8 01/04/2021   MCV 105.0 (H) 01/04/2021   PLT 194 01/04/2021   Lab Results  Component Value Date   ALT 9 10/29/2020   AST 21 10/29/2020   ALKPHOS 60 10/29/2020   BILITOT 1.4 (H) 10/29/2020     Review of Systems  Constitutional: Negative for chills, fever and malaise/fatigue.  HENT: Negative for drooling, ear discharge, ear pain and sore throat.   Eyes: Negative for blurred vision.  Respiratory:  Negative for cough, shortness of breath and wheezing.   Cardiovascular: Negative for chest pain, palpitations, orthopnea, leg swelling and PND.  Gastrointestinal: Negative for abdominal pain, blood in stool, constipation, diarrhea and nausea.  Endocrine: Negative for polydipsia.  Genitourinary: Negative for dysuria, frequency, hematuria and urgency.  Musculoskeletal: Negative for back pain, myalgias and neck pain.  Skin: Negative for rash.  Allergic/Immunologic: Negative for environmental allergies.  Neurological: Negative for dizziness and headaches.  Hematological: Does not bruise/bleed easily.  Psychiatric/Behavioral: Negative for suicidal ideas. The patient is not nervous/anxious.     Patient Active Problem List   Diagnosis Date Noted  . COPD with acute exacerbation (HCC) 01/03/2021  . Bullous emphysema (HCC) 01/03/2021  . Acute exacerbation of chronic obstructive pulmonary disease (COPD) (HCC) 10/31/2020  . COPD exacerbation (HCC) 10/29/2020  . Acute respiratory failure with hypoxia (HCC) 10/29/2020  . History of stroke 10/29/2020  . Aortic atherosclerosis (HCC) 12/30/2018  . Benign hematuria 09/18/2016  . Weakness of left hand 07/02/2016  . Lipid screening 01/03/2016  . Cerebral vascular disease 09/14/2015  . Carotid artery narrowing 08/15/2015  . Bilateral carotid artery stenosis 08/15/2015  . Bradycardia 08/01/2015  . H/O transient cerebral ischemia 08/01/2015  . Combined fat and carbohydrate induced hyperlipemia 08/01/2015  . Breathlessness on exertion 07/30/2015  . Absolute anemia 04/03/2015  . 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  . Cerebral vascular accident (HCC) 04/03/2015  . HLD (hyperlipidemia) 04/03/2015  . Blood glucose elevated 04/03/2015  . Lung nodule, solitary  04/03/2015  . Non compliance with medical treatment 04/03/2015  . Lung nodule, multiple 04/03/2015  . Cerebral infarction (HCC) 04/03/2015  . Abnormal lung field 04/03/2015    No Known  Allergies  Past Surgical History:  Procedure Laterality Date  . COLONOSCOPY  2011   normal- MD docs    Social History   Tobacco Use  . Smoking status: Former Smoker    Packs/day: 0.02    Years: 60.00    Pack years: 1.20    Types: Cigarettes    Quit date: 10/26/2020    Years since quitting: 0.3  . Smokeless tobacco: Never Used  . Tobacco comment: [place] smoking cessation information provided  Vaping Use  . Vaping Use: Never used  Substance Use Topics  . Alcohol use: Yes    Alcohol/week: 1.0 standard drink    Types: 1 Cans of beer per week  . Drug use: Yes    Comment: marijuana     Medication list has been reviewed and updated.  Current Meds  Medication Sig  . albuterol (VENTOLIN HFA) 108 (90 Base) MCG/ACT inhaler Inhale 2 puffs into the lungs every 6 (six) hours as needed for wheezing or shortness of breath.  Marland Kitchen atorvastatin (LIPITOR) 80 MG tablet Take 1 tablet (80 mg total) by mouth daily.  . clopidogrel (PLAVIX) 75 MG tablet Take 1 tablet (75 mg total) by mouth daily.  . clotrimazole-betamethasone (LOTRISONE) cream Apply 1 application topically 2 (two) times daily.  . montelukast (SINGULAIR) 10 MG tablet Take 1 tablet (10 mg total) by mouth at bedtime.  . tamsulosin (FLOMAX) 0.4 MG CAPS capsule TAKE 1 CAPSULE(0.4 MG) BY MOUTH DAILY    PHQ 2/9 Scores 03/15/2021 02/28/2021 12/31/2020 11/05/2020  PHQ - 2 Score 0 0 0 0  PHQ- 9 Score 0 4 0 2    GAD 7 : Generalized Anxiety Score 03/15/2021 02/28/2021 12/31/2020 11/05/2020  Nervous, Anxious, on Edge 0 0 0 1  Control/stop worrying 0 0 0 0  Worry too much - different things 0 0 0 0  Trouble relaxing 0 0 0 0  Restless 0 0 0 0  Easily annoyed or irritable 0 0 0 0  Afraid - awful might happen 0 0 0 0  Total GAD 7 Score 0 0 0 1  Anxiety Difficulty - - - Not difficult at all    BP Readings from Last 3 Encounters:  03/15/21 130/70  03/11/21 (!) 153/74  02/28/21 (!) 148/70    Physical Exam Vitals and nursing note  reviewed.  HENT:     Head: Normocephalic.     Right Ear: Tympanic membrane, ear canal and external ear normal.     Left Ear: Tympanic membrane, ear canal and external ear normal.     Nose: Nose normal. No congestion or rhinorrhea.     Mouth/Throat:     Mouth: Mucous membranes are moist.  Eyes:     General: No scleral icterus.       Right eye: No discharge.        Left eye: No discharge.     Conjunctiva/sclera: Conjunctivae normal.     Pupils: Pupils are equal, round, and reactive to light.  Neck:     Thyroid: No thyromegaly.     Vascular: No JVD.     Trachea: No tracheal deviation.  Cardiovascular:     Rate and Rhythm: Normal rate and regular rhythm.     Heart sounds: Normal heart sounds. No murmur heard. No friction rub. No gallop.  Pulmonary:     Effort: No respiratory distress.     Breath sounds: Normal breath sounds. No wheezing, rhonchi or rales.  Abdominal:     General: Bowel sounds are normal.     Palpations: Abdomen is soft. There is no mass.     Tenderness: There is no abdominal tenderness. There is no guarding or rebound.  Musculoskeletal:        General: No tenderness. Normal range of motion.     Cervical back: Normal range of motion and neck supple.  Lymphadenopathy:     Cervical: No cervical adenopathy.  Skin:    General: Skin is warm.     Findings: No rash.  Neurological:     Mental Status: He is alert and oriented to person, place, and time.     Cranial Nerves: No cranial nerve deficit.     Deep Tendon Reflexes: Reflexes are normal and symmetric.     Wt Readings from Last 3 Encounters:  03/15/21 134 lb (60.8 kg)  02/28/21 132 lb (59.9 kg)  12/31/20 126 lb (57.2 kg)    BP 130/70   Pulse 64   Ht 6' (1.829 m)   Wt 134 lb (60.8 kg)   BMI 18.17 kg/m   Assessment and Plan:  1. Rash New onset.  Resolving.  Stable.  Patient developed a rash which was treated as scabies.  I have a feeling that was most likely secondary to the hydrochlorothiazide that  was started recently.  Rash has resolved from the use of a cream but also from cessation of hydrochlorothiazide.  We will document that there is a possibility of rash with this medication and will avoid it in the future.  Blood pressure is doing well off the medication and we will continue to watch this without medication at this time.

## 2021-03-25 ENCOUNTER — Other Ambulatory Visit: Payer: Self-pay | Admitting: Family Medicine

## 2021-03-25 DIAGNOSIS — I700 Atherosclerosis of aorta: Secondary | ICD-10-CM

## 2021-03-25 DIAGNOSIS — I679 Cerebrovascular disease, unspecified: Secondary | ICD-10-CM

## 2021-03-25 NOTE — Telephone Encounter (Signed)
Requested Prescriptions  Pending Prescriptions Disp Refills  . clopidogrel (PLAVIX) 75 MG tablet [Pharmacy Med Name: CLOPIDOGREL 75MG  TABLETS] 90 tablet 1    Sig: TAKE 1 TABLET(75 MG) BY MOUTH DAILY     Hematology: Antiplatelets - clopidogrel Failed - 03/25/2021 12:53 PM      Failed - Evaluate AST, ALT within 2 months of therapy initiation.      Failed - HGB in normal range and within 180 days    Hemoglobin  Date Value Ref Range Status  01/04/2021 12.6 (L) 13.0 - 17.0 g/dL Final  01/06/2021 37/90/2409 13.0 - 17.7 g/dL Final         Passed - ALT in normal range and within 360 days    ALT  Date Value Ref Range Status  10/29/2020 9 0 - 44 U/L Final         Passed - AST in normal range and within 360 days    AST  Date Value Ref Range Status  10/29/2020 21 15 - 41 U/L Final         Passed - HCT in normal range and within 180 days    HCT  Date Value Ref Range Status  01/04/2021 39.8 39.0 - 52.0 % Final   Hematocrit  Date Value Ref Range Status  05/30/2019 44.4 37.5 - 51.0 % Final         Passed - PLT in normal range and within 180 days    Platelets  Date Value Ref Range Status  01/04/2021 194 150 - 400 K/uL Final  05/30/2019 220 150 - 450 x10E3/uL Final         Passed - Valid encounter within last 6 months    Recent Outpatient Visits          1 week ago Rash   [facility] 06/01/2019, MD   3 weeks ago Primary hypertension   [facility] Duanne Limerick, MD   2 months ago Tinea corporis   [facility] Duanne Limerick, MD   4 months ago Centrilobular emphysema [facility])   [facility], MD   8 months ago Nail avulsion of toe, sequela   [facility] Duanne Limerick, MD      Future Appointments            In 2 weeks Duanne Limerick, MD [facility]   In 4 months [place], [facility] [place]

## 2021-03-26 DIAGNOSIS — J449 Chronic obstructive pulmonary disease, unspecified: Secondary | ICD-10-CM | POA: Diagnosis not present

## 2021-04-11 ENCOUNTER — Ambulatory Visit (INDEPENDENT_AMBULATORY_CARE_PROVIDER_SITE_OTHER): Payer: Medicare HMO | Admitting: Family Medicine

## 2021-04-11 ENCOUNTER — Encounter: Payer: Self-pay | Admitting: Family Medicine

## 2021-04-11 ENCOUNTER — Other Ambulatory Visit: Payer: Self-pay

## 2021-04-11 VITALS — BP 124/80 | HR 60 | Ht 72.0 in | Wt 135.8 lb

## 2021-04-11 DIAGNOSIS — I700 Atherosclerosis of aorta: Secondary | ICD-10-CM

## 2021-04-11 DIAGNOSIS — I10 Essential (primary) hypertension: Secondary | ICD-10-CM

## 2021-04-11 DIAGNOSIS — E785 Hyperlipidemia, unspecified: Secondary | ICD-10-CM

## 2021-04-11 DIAGNOSIS — I679 Cerebrovascular disease, unspecified: Secondary | ICD-10-CM | POA: Diagnosis not present

## 2021-04-11 MED ORDER — ATORVASTATIN CALCIUM 80 MG PO TABS: 80.0000 mg | ORAL_TABLET | Freq: Every day | ORAL | 1 refills | Status: DC

## 2021-04-11 NOTE — Progress Notes (Signed)
Date:  04/11/2021   Name:  Nathaniel Garcia   DOB:  December 16, 1945   MRN:  [id number]   Chief Complaint: Hypertension (Follow up without medication.)  Hypertension This is a chronic problem. The current episode started more than 1 year ago. The problem has been gradually improving since onset. Pertinent negatives include no anxiety, blurred vision, chest pain, headaches, malaise/fatigue, neck pain, orthopnea, palpitations, peripheral edema, PND, shortness of breath or sweats. There are no associated agents to hypertension. Risk factors for coronary artery disease include dyslipidemia. Past treatments include lifestyle changes. The current treatment provides moderate improvement. There are no compliance problems.  There is no history of angina, kidney disease, CAD/MI, CVA, heart failure, left ventricular hypertrophy, PVD or retinopathy. There is no history of chronic renal disease, a hypertension causing med or renovascular disease.  Hyperlipidemia This is a chronic problem. The current episode started more than 1 year ago. The problem is uncontrolled. He has no history of chronic renal disease, diabetes, hypothyroidism, liver disease, obesity or nephrotic syndrome. Factors aggravating his hyperlipidemia include thiazides. Pertinent negatives include no chest pain, focal sensory loss, focal weakness, myalgias or shortness of breath.    Lab Results  Component Value Date   CREATININE 1.02 01/04/2021   BUN 13 01/04/2021   NA 136 01/04/2021   K 4.4 01/04/2021   CL 104 01/04/2021   CO2 21 (L) 01/04/2021   Lab Results  Component Value Date   CHOL 177 06/29/2018   HDL 54 06/29/2018   LDLCALC 110 (H) 06/29/2018   TRIG 64 06/29/2018   CHOLHDL 3.3 06/29/2018   Lab Results  Component Value Date   TSH 1.650 05/30/2019   No results found for: HGBA1C Lab Results  Component Value Date   WBC 5.0 01/04/2021   HGB 12.6 (L) 01/04/2021   HCT 39.8 01/04/2021   MCV 105.0 (H) 01/04/2021   PLT  194 01/04/2021   Lab Results  Component Value Date   ALT 9 10/29/2020   AST 21 10/29/2020   ALKPHOS 60 10/29/2020   BILITOT 1.4 (H) 10/29/2020     Review of Systems  Constitutional: Negative for chills, fever and malaise/fatigue.  HENT: Negative for drooling, ear discharge, ear pain and sore throat.   Eyes: Negative for blurred vision.  Respiratory: Negative for cough, shortness of breath and wheezing.   Cardiovascular: Negative for chest pain, palpitations, orthopnea, leg swelling and PND.  Gastrointestinal: Negative for abdominal pain, blood in stool, constipation, diarrhea and nausea.  Endocrine: Negative for polydipsia.  Genitourinary: Negative for dysuria, frequency, hematuria and urgency.  Musculoskeletal: Negative for back pain, myalgias and neck pain.  Skin: Negative for rash.  Allergic/Immunologic: Negative for environmental allergies.  Neurological: Negative for dizziness, focal weakness and headaches.  Hematological: Does not bruise/bleed easily.  Psychiatric/Behavioral: Negative for suicidal ideas. The patient is not nervous/anxious.     Patient Active Problem List   Diagnosis Date Noted  . COPD with acute exacerbation (HCC) 01/03/2021  . Bullous emphysema (HCC) 01/03/2021  . Acute exacerbation of chronic obstructive pulmonary disease (COPD) (HCC) 10/31/2020  . COPD exacerbation (HCC) 10/29/2020  . Acute respiratory failure with hypoxia (HCC) 10/29/2020  . History of stroke 10/29/2020  . Aortic atherosclerosis (HCC) 12/30/2018  . Benign hematuria 09/18/2016  . Weakness of left hand 07/02/2016  . Lipid screening 01/03/2016  . Cerebral vascular disease 09/14/2015  . Carotid artery narrowing 08/15/2015  . Bilateral carotid artery stenosis 08/15/2015  . Bradycardia 08/01/2015  . H/O transient cerebral  ischemia 08/01/2015  . Combined fat and carbohydrate induced hyperlipemia 08/01/2015  . Breathlessness on exertion 07/30/2015  . Absolute anemia 04/03/2015  .  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  . Cerebral vascular accident (HCC) 04/03/2015  . HLD (hyperlipidemia) 04/03/2015  . Blood glucose elevated 04/03/2015  . Lung nodule, solitary 04/03/2015  . Non compliance with medical treatment 04/03/2015  . Lung nodule, multiple 04/03/2015  . Cerebral infarction (HCC) 04/03/2015  . Abnormal lung field 04/03/2015    Allergies  Allergen Reactions  . Hctz [Hydrochlorothiazide]     Past Surgical History:  Procedure Laterality Date  . COLONOSCOPY  2011   normal- [facility]    Social History   Tobacco Use  . Smoking status: Former Smoker    Packs/day: 0.02    Years: 60.00    Pack years: 1.20    Types: Cigarettes    Quit date: 10/26/2020    Years since quitting: 0.4  . Smokeless tobacco: Never Used  . Tobacco comment: [place] smoking cessation information provided  Vaping Use  . Vaping Use: Never used  Substance Use Topics  . Alcohol use: Yes    Alcohol/week: 1.0 standard drink    Types: 1 Cans of beer per week  . Drug use: Yes    Comment: marijuana     Medication list has been reviewed and updated.  Current Meds  Medication Sig  . albuterol (VENTOLIN HFA) 108 (90 Base) MCG/ACT inhaler Inhale 2 puffs into the lungs every 6 (six) hours as needed for wheezing or shortness of breath.  Marland Kitchen atorvastatin (LIPITOR) 80 MG tablet Take 1 tablet (80 mg total) by mouth daily.  . clopidogrel (PLAVIX) 75 MG tablet TAKE 1 TABLET(75 MG) BY MOUTH DAILY  . clotrimazole-betamethasone (LOTRISONE) cream Apply 1 application topically 2 (two) times daily.  . montelukast (SINGULAIR) 10 MG tablet Take 1 tablet (10 mg total) by mouth at bedtime.  . tamsulosin (FLOMAX) 0.4 MG CAPS capsule TAKE 1 CAPSULE(0.4 MG) BY MOUTH DAILY    PHQ 2/9 Scores 03/15/2021 02/28/2021 12/31/2020 11/05/2020  PHQ - 2 Score 0 0 0 0  PHQ- 9 Score 0 4 0 2    GAD 7 : Generalized Anxiety Score 03/15/2021 02/28/2021 12/31/2020 11/05/2020  Nervous, Anxious, on Edge 0 0 0 1   Control/stop worrying 0 0 0 0  Worry too much - different things 0 0 0 0  Trouble relaxing 0 0 0 0  Restless 0 0 0 0  Easily annoyed or irritable 0 0 0 0  Afraid - awful might happen 0 0 0 0  Total GAD 7 Score 0 0 0 1  Anxiety Difficulty - - - Not difficult at all    BP Readings from Last 3 Encounters:  04/11/21 124/80  03/15/21 130/70  03/11/21 (!) 153/74    Physical Exam  Wt Readings from Last 3 Encounters:  04/11/21 135 lb 12.8 oz (61.6 kg)  03/15/21 134 lb (60.8 kg)  02/28/21 132 lb (59.9 kg)    BP 124/80   Pulse 60   Ht 6' (1.829 m)   Wt 135 lb 12.8 oz (61.6 kg)   BMI 18.42 kg/m   Assessment and Plan:  1. Cerebral vascular disease Chronic.  Controlled.  Stable.  No progression of circumstances since last evaluation.  We will continue atorvastatin 80 mg once a day. - atorvastatin (LIPITOR) 80 MG tablet; Take 1 tablet (80 mg total) by mouth daily.  Dispense: 90 tablet; Refill: 1  2. Hyperlipidemia, unspecified hyperlipidemia type  Chronic.  Controlled.  Stable.  Patient has not been taking his atorvastatin explained to him the importance given his underlying cerebrovascular disease.  Patient will resume his atorvastatin 80 mg once a day. - atorvastatin (LIPITOR) 80 MG tablet; Take 1 tablet (80 mg total) by mouth daily.  Dispense: 90 tablet; Refill: 1  3. Aortic atherosclerosis (HCC) As noted above we are controlling with control of blood pressure, lipid control, and smoking cessation.  4. Primary hypertension Chronic.  Controlled.  Stable.  Upon discontinuance of his hydrochlorothiazide which I think is probably contributing to the rash returns today notes that the blood pressure is 124/80 and will continue with control of limiting sodium intake.

## 2021-04-11 NOTE — Patient Instructions (Signed)
Low-Sodium Eating Plan Sodium, which is an element that makes up salt, helps you maintain a healthy balance of fluids in your body. Too much sodium can increase your blood pressure and cause fluid and waste to be held in your body. Your health care provider or dietitian may recommend following this plan if you have high blood pressure (hypertension), kidney disease, liver disease, or heart failure. Eating less sodium can help lower your blood pressure, reduce swelling, and protect your heart, liver, and kidneys. What are tips for following this plan? Reading food labels  The Nutrition Facts label lists the amount of sodium in one serving of the food. If you eat more than one serving, you must multiply the listed amount of sodium by the number of servings.  Choose foods with less than 140 mg of sodium per serving.  Avoid foods with 300 mg of sodium or more per serving. Shopping  Look for lower-sodium products, often labeled as "low-sodium" or "no salt added."  Always check the sodium content, even if foods are labeled as "unsalted" or "no salt added."  Buy fresh foods. ? Avoid canned foods and pre-made or frozen meals. ? Avoid canned, cured, or processed meats.  Buy breads that have less than 80 mg of sodium per slice.   Cooking  Eat more home-cooked food and less restaurant, buffet, and fast food.  Avoid adding salt when cooking. Use salt-free seasonings or herbs instead of table salt or sea salt. Check with your health care provider or pharmacist before using salt substitutes.  Cook with plant-based oils, such as canola, sunflower, or olive oil.   Meal planning  When eating at a restaurant, ask that your food be prepared with less salt or no salt, if possible. Avoid dishes labeled as brined, pickled, cured, smoked, or made with soy sauce, miso, or teriyaki sauce.  Avoid foods that contain MSG (monosodium glutamate). MSG is sometimes added to Chinese food, bouillon, and some canned  foods.  Make meals that can be grilled, baked, poached, roasted, or steamed. These are generally made with less sodium. General information Most people on this plan should limit their sodium intake to 1,500-2,000 mg (milligrams) of sodium each day. What foods should I eat? Fruits Fresh, frozen, or canned fruit. Fruit juice. Vegetables Fresh or frozen vegetables. "No salt added" canned vegetables. "No salt added" tomato sauce and paste. Low-sodium or reduced-sodium tomato and vegetable juice. Grains Low-sodium cereals, including oats, puffed wheat and rice, and shredded wheat. Low-sodium crackers. Unsalted rice. Unsalted pasta. Low-sodium bread. Whole-grain breads and whole-grain pasta. Meats and other proteins Fresh or frozen (no salt added) meat, poultry, seafood, and fish. Low-sodium canned tuna and salmon. Unsalted nuts. Dried peas, beans, and lentils without added salt. Unsalted canned beans. Eggs. Unsalted nut butters. Dairy Milk. Soy milk. Cheese that is naturally low in sodium, such as ricotta cheese, fresh mozzarella, or Swiss cheese. Low-sodium or reduced-sodium cheese. Cream cheese. Yogurt. Seasonings and condiments Fresh and dried herbs and spices. Salt-free seasonings. Low-sodium mustard and ketchup. Sodium-free salad dressing. Sodium-free light mayonnaise. Fresh or refrigerated horseradish. Lemon juice. Vinegar. Other foods Homemade, reduced-sodium, or low-sodium soups. Unsalted popcorn and pretzels. Low-salt or salt-free chips. The items listed above may not be a complete list of foods and beverages you can eat. Contact a dietitian for more information. What foods should I avoid? Vegetables Sauerkraut, pickled vegetables, and relishes. Olives. French fries. Onion rings. Regular canned vegetables (not low-sodium or reduced-sodium). Regular canned tomato sauce and paste (not low-sodium   or reduced-sodium). Regular tomato and vegetable juice (not low-sodium or reduced-sodium). Frozen  vegetables in sauces. Grains Instant hot cereals. Bread stuffing, pancake, and biscuit mixes. Croutons. Seasoned rice or pasta mixes. Noodle soup cups. Boxed or frozen macaroni and cheese. Regular salted crackers. Self-rising flour. Meats and other proteins Meat or fish that is salted, canned, smoked, spiced, or pickled. Precooked or cured meat, such as sausages or meat loaves. Bacon. Ham. Pepperoni. Hot dogs. Corned beef. Chipped beef. Salt pork. Jerky. Pickled herring. Anchovies and sardines. Regular canned tuna. Salted nuts. Dairy Processed cheese and cheese spreads. Hard cheeses. Cheese curds. Blue cheese. Feta cheese. String cheese. Regular cottage cheese. Buttermilk. Canned milk. Fats and oils Salted butter. Regular margarine. Ghee. Bacon fat. Seasonings and condiments Onion salt, garlic salt, seasoned salt, table salt, and sea salt. Canned and packaged gravies. Worcestershire sauce. Tartar sauce. Barbecue sauce. Teriyaki sauce. Soy sauce, including reduced-sodium. Steak sauce. Fish sauce. Oyster sauce. Cocktail sauce. Horseradish that you find on the shelf. Regular ketchup and mustard. Meat flavorings and tenderizers. Bouillon cubes. Hot sauce. Pre-made or packaged marinades. Pre-made or packaged taco seasonings. Relishes. Regular salad dressings. Salsa. Other foods Salted popcorn and pretzels. Corn chips and puffs. Potato and tortilla chips. Canned or dried soups. Pizza. Frozen entrees and pot pies. The items listed above may not be a complete list of foods and beverages you should avoid. Contact a dietitian for more information. Summary  Eating less sodium can help lower your blood pressure, reduce swelling, and protect your heart, liver, and kidneys.  Most people on this plan should limit their sodium intake to 1,500-2,000 mg (milligrams) of sodium each day.  Canned, boxed, and frozen foods are high in sodium. Restaurant foods, fast foods, and pizza are also very high in sodium. You  also get sodium by adding salt to food.  Try to cook at home, eat more fresh fruits and vegetables, and eat less fast food and canned, processed, or prepared foods. This information is not intended to replace advice given to you by your health care provider. Make sure you discuss any questions you have with your health care provider. Document Revised: 12/09/2019 Document Reviewed: 10/05/2019 Elsevier Patient Education  2021 [employer]  

## 2021-04-16 DIAGNOSIS — J441 Chronic obstructive pulmonary disease with (acute) exacerbation: Secondary | ICD-10-CM | POA: Diagnosis not present

## 2021-04-16 DIAGNOSIS — R0600 Dyspnea, unspecified: Secondary | ICD-10-CM | POA: Diagnosis not present

## 2021-04-18 ENCOUNTER — Telehealth: Payer: Self-pay | Admitting: Family Medicine

## 2021-04-18 NOTE — Telephone Encounter (Signed)
Pt is calling because he has a cramp in his leg. Pt declined an appt. Wanted to ask Delice Bison is this of importance. [phone]

## 2021-04-19 NOTE — Telephone Encounter (Signed)
Called and pt has not been drinking fluids. Told him to start drinking water or gatorade and if that doesn't improve, pick up otc magnesium and potassium supplement. If still no improvement by Monday, need to see

## 2021-04-26 DIAGNOSIS — J449 Chronic obstructive pulmonary disease, unspecified: Secondary | ICD-10-CM | POA: Diagnosis not present

## 2021-05-13 ENCOUNTER — Ambulatory Visit: Payer: Medicare HMO | Admitting: Urology

## 2021-05-26 DIAGNOSIS — J449 Chronic obstructive pulmonary disease, unspecified: Secondary | ICD-10-CM | POA: Diagnosis not present

## 2021-05-29 DIAGNOSIS — J209 Acute bronchitis, unspecified: Secondary | ICD-10-CM | POA: Diagnosis not present

## 2021-05-29 DIAGNOSIS — J441 Chronic obstructive pulmonary disease with (acute) exacerbation: Secondary | ICD-10-CM | POA: Diagnosis not present

## 2021-05-29 DIAGNOSIS — Z03818 Encounter for observation for suspected exposure to other biological agents ruled out: Secondary | ICD-10-CM | POA: Diagnosis not present

## 2021-06-12 DIAGNOSIS — J449 Chronic obstructive pulmonary disease, unspecified: Secondary | ICD-10-CM | POA: Diagnosis not present

## 2021-06-12 DIAGNOSIS — R0609 Other forms of dyspnea: Secondary | ICD-10-CM | POA: Diagnosis not present

## 2021-06-26 DIAGNOSIS — J449 Chronic obstructive pulmonary disease, unspecified: Secondary | ICD-10-CM | POA: Diagnosis not present

## 2021-07-01 ENCOUNTER — Ambulatory Visit (INDEPENDENT_AMBULATORY_CARE_PROVIDER_SITE_OTHER): Payer: Medicare HMO

## 2021-07-01 DIAGNOSIS — Z122 Encounter for screening for malignant neoplasm of respiratory organs: Secondary | ICD-10-CM

## 2021-07-01 DIAGNOSIS — Z0000 Encounter for general adult medical examination without abnormal findings: Secondary | ICD-10-CM

## 2021-07-01 DIAGNOSIS — Z87891 Personal history of nicotine dependence: Secondary | ICD-10-CM

## 2021-07-01 NOTE — Progress Notes (Signed)
Subjective:   Nathaniel Garcia is a 75 y.o. male who presents for Medicare Annual/Subsequent preventive examination.  Virtual Visit via Telephone Note  I connected with  Nathaniel Garcia on 07/01/21 at 10:00 AM EDT by telephone and verified that I am speaking with the correct person using two identifiers.  Location: Patient: home Provider: [facility] Persons participating in the virtual visit: patient/Nurse Health Advisor   I discussed the limitations, risks, security and privacy concerns of performing an evaluation and management service by telephone and the availability of in person appointments. The patient expressed understanding and agreed to proceed.  Interactive audio and video telecommunications were attempted between this nurse and patient, however failed, due to patient having technical difficulties OR patient did not have access to video capability.  We continued and completed visit with audio only.  Some vital signs may be absent or patient reported.   Reather Littler, LPN   Review of Systems     Cardiac Risk Factors include: advanced age (>47men, >46 women);dyslipidemia;male gender     Objective:    There were no vitals filed for this visit. There is no height or weight on file to calculate BMI.  Advanced Directives 07/01/2021 10/30/2020 10/29/2020 06/27/2020 06/20/2019 06/16/2018 07/18/2015  Does Patient Have a Medical Advance Directive? No No No No No No No  Would patient like information on creating a medical advance directive? Yes (MAU/Ambulatory/Procedural Areas - Information given) Yes (Inpatient - patient requests chaplain consult to create a medical advance directive) - Yes (MAU/Ambulatory/Procedural Areas - Information given) No - Patient declined Yes (MAU/Ambulatory/Procedural Areas - Information given) No - patient declined information    Current Medications (verified) Outpatient Encounter Medications as of 07/01/2021  Medication Sig   albuterol  (VENTOLIN HFA) 108 (90 Base) MCG/ACT inhaler Inhale 2 puffs into the lungs every 6 (six) hours as needed for wheezing or shortness of breath.   tamsulosin (FLOMAX) 0.4 MG CAPS capsule TAKE 1 CAPSULE(0.4 MG) BY MOUTH DAILY   atorvastatin (LIPITOR) 80 MG tablet Take 1 tablet (80 mg total) by mouth daily. (Patient not taking: Reported on 07/01/2021)   clopidogrel (PLAVIX) 75 MG tablet TAKE 1 TABLET(75 MG) BY MOUTH DAILY (Patient not taking: Reported on 07/01/2021)   montelukast (SINGULAIR) 10 MG tablet Take 1 tablet (10 mg total) by mouth at bedtime. (Patient not taking: Reported on 07/01/2021)   [DISCONTINUED] clotrimazole-betamethasone (LOTRISONE) cream Apply 1 application topically 2 (two) times daily.   No facility-administered encounter medications on file as of 07/01/2021.    Allergies (verified) Hctz [hydrochlorothiazide]   History: Past Medical History:  Diagnosis Date   COPD (chronic obstructive pulmonary disease) (HCC)    Hematuria    Hyperlipidemia    Stroke [facility])    Past Surgical History:  Procedure Laterality Date   COLONOSCOPY  2011   normal- MD docs   Family History  Problem Relation Age of Onset   Prostate cancer Neg Hx    Social History   Socioeconomic History   Marital status: Legally Separated    Spouse name: Not on file   Number of children: 3   Years of education: some college   Highest education level: 12th grade  Occupational History   Occupation: Retired  Tobacco Use   Smoking status: Former    Packs/day: 0.02    Years: 60.00    Pack years: 1.20    Types: Cigarettes    Quit date: 10/26/2020    Years since quitting: 0.6   Smokeless tobacco: Never  Tobacco comments:    [place] smoking cessation information provided  Vaping Use   Vaping Use: Never used  Substance and Sexual Activity   Alcohol use: Yes    Alcohol/week: 2.0 standard drinks    Types: 2 Cans of beer per week    Comment: weekly   Drug use: Not Currently    Comment: marijuana    Sexual activity: Never  Other Topics Concern   Not on file  Social History Narrative   Pt lives alone.   Social Determinants of Health   Financial Resource Strain: Low Risk    Difficulty of Paying Living Expenses: Not hard at all  Food Insecurity: No Food Insecurity   Worried About Programme researcher, broadcasting/film/videounning Out of Food in the Last Year: Never true   Ran Out of Food in the Last Year: Never true  Transportation Needs: No Transportation Needs   Lack of Transportation (Medical): No   Lack of Transportation (Non-Medical): No  Physical Activity: Insufficiently Active   Days of Exercise per Week: 4 days   Minutes of Exercise per Session: 10 min  Stress: No Stress Concern Present   Feeling of Stress : Only a little  Social Connections: Socially Isolated   Frequency of Communication with Friends and Family: More than three times a week   Frequency of Social Gatherings with Friends and Family: Three times a week   Attends Religious Services: Never   Active Member of Clubs or Organizations: No   Attends BankerClub or Organization Meetings: Never   Marital Status: Separated    Tobacco Counseling Counseling given: Not Answered Tobacco comments: [place] smoking cessation information provided   Clinical Intake:  Pre-visit preparation completed: Yes  Pain : No/denies pain     Nutritional Risks: None Diabetes: No  How often do you need to have someone help you when you read instructions, pamphlets, or other written materials from your doctor or pharmacy?: 1 - Never    Interpreter Needed?: No  Information entered by :: Reather LittlerKasey Moxie Kalil LPN   Activities of Daily Living In your present state of health, do you have any difficulty performing the following activities: 07/01/2021 10/30/2020  Hearing? N N  Vision? N N  Difficulty concentrating or making decisions? N N  Walking or climbing stairs? N N  Dressing or bathing? N N  Doing errands, shopping? N N  Preparing Food and eating ? N -  Using the  Toilet? N -  In the past six months, have you accidently leaked urine? N -  Do you have problems with loss of bowel control? N -  Managing your Medications? N -  Managing your Finances? N -  Housekeeping or managing your Housekeeping? N -  Some recent data might be hidden    Patient Care Team: Duanne LimerickJones, Deanna C, MD as PCP - General (Family Medicine) Hulan FrayMcGowan, Shannon A, PA-C as Physician Assistant (Urology) Mertie MooresFleming, Herbon E, MD as Consulting Physician (Specialist)  Indicate any recent Medical Services you may have received from other than [facility] providers in the past year (date may be approximate).     Assessment:   This is a routine wellness examination for Nathaniel MinkFrederick.  Hearing/Vision screen Hearing Screening - Comments:: Pt denies hearing difficulty Vision Screening - Comments:: Annual vision screenings at eye provider in [place]  Dietary issues and exercise activities discussed: Current Exercise Habits: Home exercise routine, Type of exercise: strength training/weights, Time (Minutes): 10, Frequency (Times/Week): 4, Weekly Exercise (Minutes/Week): 40, Intensity: Mild, Exercise limited by: respiratory conditions(s)  Goals Addressed             This Visit's Progress    DIET - INCREASE WATER INTAKE   On track    Recommend to drink at least 6-8 8oz glasses of water per day.       Depression Screen PHQ 2/9 Scores 07/01/2021 03/15/2021 02/28/2021 12/31/2020 11/05/2020 06/27/2020 05/31/2020  PHQ - 2 Score 0 0 0 0 0 0 0  PHQ- 9 Score - 0 4 0 2 - 0    Fall Risk Fall Risk  07/01/2021 02/28/2021 12/31/2020 06/27/2020 05/31/2020  Falls in the past year? 0 0 0 0 0  Number falls in past yr: 0 - - 0 -  Injury with Fall? 0 - - 0 -  Risk for fall due to : No Fall Risks - - No Fall Risks -  Risk for fall due to: Comment - - - - -  Follow up Falls prevention discussed Falls evaluation completed Falls evaluation completed Falls prevention discussed Falls evaluation completed    FALL RISK  PREVENTION PERTAINING TO THE HOME:  Any stairs in or around the home? Yes  If so, are there any without handrails? No  Home free of loose throw rugs in walkways, pet beds, electrical cords, etc? Yes  Adequate lighting in your home to reduce risk of falls? Yes   ASSISTIVE DEVICES UTILIZED TO PREVENT FALLS:  Life alert? No  Use of a cane, walker or w/c? No  Grab bars in the bathroom? Yes  Shower chair or bench in shower? No  Elevated toilet seat or a handicapped toilet? No   TIMED UP AND GO:  Was the test performed? No . Telephonic visit.   Cognitive Function: Normal cognitive status assessed by direct observation by this Nurse Health Advisor. No abnormalities found.       6CIT Screen 02/28/2021 06/27/2020 06/20/2019 06/16/2018  What Year? 0 points 0 points 0 points 0 points  What month? 0 points 0 points 0 points 0 points  What time? 0 points 0 points 0 points 0 points  Count back from 20 0 points 0 points 0 points 0 points  Months in reverse 0 points 0 points 2 points 0 points  Repeat phrase 0 points 0 points 0 points 0 points  Total Score 0 0 2 0    Immunizations Immunization History  Administered Date(s) Administered   PFIZER(Purple Top)SARS-COV-2 Vaccination 12/29/2019, 01/23/2020    TDAP status: Due, Education has been provided regarding the importance of this vaccine. Advised may receive this vaccine at local pharmacy or Health Dept. Aware to provide a copy of the vaccination record if obtained from local pharmacy or Health Dept. Verbalized acceptance and understanding.  Flu Vaccine status: Declined, Education has been provided regarding the importance of this vaccine but patient still declined. Advised may receive this vaccine at local pharmacy or Health Dept. Aware to provide a copy of the vaccination record if obtained from local pharmacy or Health Dept. Verbalized acceptance and understanding.  Pneumococcal vaccine status: Declined,  Education has been provided  regarding the importance of this vaccine but patient still declined. Advised may receive this vaccine at local pharmacy or Health Dept. Aware to provide a copy of the vaccination record if obtained from local pharmacy or Health Dept. Verbalized acceptance and understanding.   Covid-19 vaccine status: Completed vaccines  Qualifies for Shingles Vaccine? Yes   Zostavax completed No   Shingrix Completed?: No.    Education has been provided regarding the importance  of this vaccine. Patient has been advised to call insurance company to determine out of pocket expense if they have not yet received this vaccine. Advised may also receive vaccine at local pharmacy or Health Dept. Verbalized acceptance and understanding.  Screening Tests Health Maintenance  Topic Date Due   Zoster Vaccines- Shingrix (1 of 2) Never done   COVID-19 Vaccine (3 - Pfizer risk series) 02/20/2020   INFLUENZA VACCINE  06/17/2021   PNA vac Low Risk Adult (1 of 2 - PCV13) 07/17/2021 (Originally 06/21/2010)   TETANUS/TDAP  12/31/2021 (Originally 06/21/1965)   Hepatitis C Screening  12/31/2021 (Originally 06/22/1964)   HPV VACCINES  Aged Out    Health Maintenance  Health Maintenance Due  Topic Date Due   Zoster Vaccines- Shingrix (1 of 2) Never done   COVID-19 Vaccine (3 - Pfizer risk series) 02/20/2020   INFLUENZA VACCINE  06/17/2021    Colorectal cancer screening: No longer required.   Lung Cancer Screening: (Low Dose CT Chest recommended if Age 62-80 years, 30 pack-year currently smoking OR have quit w/in 15years.) does qualify.   Lung Cancer Screening Referral: sent today  Additional Screening:  Hepatitis C Screening: does qualify; postponed  Vision Screening: Recommended annual ophthalmology exams for early detection of glaucoma and other disorders of the eye. Is the patient up to date with their annual eye exam?  Yes  Who is the provider or what is the name of the office in which the patient attends annual eye  exams? Eye provider in [place].   Dental Screening: Recommended annual dental exams for proper oral hygiene  Community Resource Referral / Chronic Care Management: CRR required this visit?  No   CCM required this visit?  No      Plan:     I have personally reviewed and noted the following in the patient's chart:   Medical and social history Use of alcohol, tobacco or illicit drugs  Current medications and supplements including opioid prescriptions. Patient is not currently taking opioid prescriptions. Functional ability and status Nutritional status Physical activity Advanced directives List of other physicians Hospitalizations, surgeries, and ER visits in previous 12 months Vitals Screenings to include cognitive, depression, and falls Referrals and appointments  In addition, I have reviewed and discussed with patient certain preventive protocols, quality metrics, and best practice recommendations. A written personalized care plan for preventive services as well as general preventive health recommendations were provided to patient.     Reather Littler, LPN   4/48/1856   Nurse Notes: patient advised to bring all medications; including nebulizers to appt on 07/12/21 for medication reconciliation. He stated he needs to call [facility] to refill plavix and has not been taking lipitor for "no particular reason" pt also not taking singulair that was prescribed in hospital and advised patient to contact Dr. Meredeth Ide.

## 2021-07-01 NOTE — Patient Instructions (Signed)
Nathaniel Garcia , Thank you for taking time to come for your Medicare Wellness Visit. I appreciate your ongoing commitment to your health goals. Please review the following plan we discussed and let me know if I can assist you in the future.   Screening recommendations/referrals: Colonoscopy: no longer required Recommended yearly ophthalmology/optometry visit for glaucoma screening and checkup Recommended yearly dental visit for hygiene and checkup  Vaccinations: Influenza vaccine: declined Pneumococcal vaccine: declined Tdap vaccine: due Shingles vaccine: Shingrix discussed. Please contact your pharmacy for coverage information.  Covid-19: done 12/29/19 & 01/23/20  Advanced directives: Advance directive discussed with you today. I have provided a copy for you to complete at home and have notarized. Once this is complete please bring a copy in to our office so we can scan it into your chart.   Conditions/risks identified: Recommend increasing physical activity as tolerated.   Next appointment: Follow up in one year for your annual wellness visit.   Preventive Care 76 Years and Older, Male Preventive care refers to lifestyle choices and visits with your health care provider that can promote health and wellness. What does preventive care include? A yearly physical exam. This is also called an annual well check. Dental exams once or twice a year. Routine eye exams. Ask your health care provider how often you should have your eyes checked. Personal lifestyle choices, including: Daily care of your teeth and gums. Regular physical activity. Eating a healthy diet. Avoiding tobacco and drug use. Limiting alcohol use. Practicing safe sex. Taking low doses of aspirin every day. Taking vitamin and mineral supplements as recommended by your health care provider. What happens during an annual well check? The services and screenings done by your health care provider during your annual well check will  depend on your age, overall health, lifestyle risk factors, and family history of disease. Counseling  Your health care provider may ask you questions about your: Alcohol use. Tobacco use. Drug use. Emotional well-being. Home and relationship well-being. Sexual activity. Eating habits. History of falls. Memory and ability to understand (cognition). Work and work Astronomer. Screening  You may have the following tests or measurements: Height, weight, and BMI. Blood pressure. Lipid and cholesterol levels. These may be checked every 5 years, or more frequently if you are over 76 years old. Skin check. Lung cancer screening. You may have this screening every year starting at age 76 if you have a 30-pack-year history of smoking and currently smoke or have quit within the past 15 years. Fecal occult blood test (FOBT) of the stool. You may have this test every year starting at age 76. Flexible sigmoidoscopy or colonoscopy. You may have a sigmoidoscopy every 5 years or a colonoscopy every 10 years starting at age 76. Prostate cancer screening. Recommendations will vary depending on your family history and other risks. Hepatitis C blood test. Hepatitis B blood test. Sexually transmitted disease (STD) testing. Diabetes screening. This is done by checking your blood sugar (glucose) after you have not eaten for a while (fasting). You may have this done every 1-3 years. Abdominal aortic aneurysm (AAA) screening. You may need this if you are a current or former smoker. Osteoporosis. You may be screened starting at age 76 if you are at high risk. Talk with your health care provider about your test results, treatment options, and if necessary, the need for more tests. Vaccines  Your health care provider may recommend certain vaccines, such as: Influenza vaccine. This is recommended every year. Tetanus, diphtheria, and  acellular pertussis (Tdap, Td) vaccine. You may need a Td booster every 10  years. Zoster vaccine. You may need this after age 30. Pneumococcal 13-valent conjugate (PCV13) vaccine. One dose is recommended after age 76. Pneumococcal polysaccharide (PPSV23) vaccine. One dose is recommended after age 76. Talk to your health care provider about which screenings and vaccines you need and how often you need them. This information is not intended to replace advice given to you by your health care provider. Make sure you discuss any questions you have with your health care provider. Document Released: 11/30/2015 Document Revised: 07/23/2016 Document Reviewed: 09/04/2015 Elsevier Interactive Patient Education  2017 [employer] Prevention in the Home Falls can cause injuries. They can happen to people of all ages. There are many things you can do to make your home safe and to help prevent falls. What can I do on the outside of my home? Regularly fix the edges of walkways and driveways and fix any cracks. Remove anything that might make you trip as you walk through a door, such as a raised step or threshold. Trim any bushes or trees on the path to your home. Use bright outdoor lighting. Clear any walking paths of anything that might make someone trip, such as rocks or tools. Regularly check to see if handrails are loose or broken. Make sure that both sides of any steps have handrails. Any raised decks and porches should have guardrails on the edges. Have any leaves, snow, or ice cleared regularly. Use sand or salt on walking paths during winter. Clean up any spills in your garage right away. This includes oil or grease spills. What can I do in the bathroom? Use night lights. Install grab bars by the toilet and in the tub and shower. Do not use towel bars as grab bars. Use non-skid mats or decals in the tub or shower. If you need to sit down in the shower, use a plastic, non-slip stool. Keep the floor dry. Clean up any water that spills on the floor as soon as it  happens. Remove soap buildup in the tub or shower regularly. Attach bath mats securely with double-sided non-slip rug tape. Do not have throw rugs and other things on the floor that can make you trip. What can I do in the bedroom? Use night lights. Make sure that you have a light by your bed that is easy to reach. Do not use any sheets or blankets that are too big for your bed. They should not hang down onto the floor. Have a firm chair that has side arms. You can use this for support while you get dressed. Do not have throw rugs and other things on the floor that can make you trip. What can I do in the kitchen? Clean up any spills right away. Avoid walking on wet floors. Keep items that you use a lot in easy-to-reach places. If you need to reach something above you, use a strong step stool that has a grab bar. Keep electrical cords out of the way. Do not use floor polish or wax that makes floors slippery. If you must use wax, use non-skid floor wax. Do not have throw rugs and other things on the floor that can make you trip. What can I do with my stairs? Do not leave any items on the stairs. Make sure that there are handrails on both sides of the stairs and use them. Fix handrails that are broken or loose. Make sure that  handrails are as long as the stairways. Check any carpeting to make sure that it is firmly attached to the stairs. Fix any carpet that is loose or worn. Avoid having throw rugs at the top or bottom of the stairs. If you do have throw rugs, attach them to the floor with carpet tape. Make sure that you have a light switch at the top of the stairs and the bottom of the stairs. If you do not have them, ask someone to add them for you. What else can I do to help prevent falls? Wear shoes that: Do not have high heels. Have rubber bottoms. Are comfortable and fit you well. Are closed at the toe. Do not wear sandals. If you use a stepladder: Make sure that it is fully opened.  Do not climb a closed stepladder. Make sure that both sides of the stepladder are locked into place. Ask someone to hold it for you, if possible. Clearly mark and make sure that you can see: Any grab bars or handrails. First and last steps. Where the edge of each step is. Use tools that help you move around (mobility aids) if they are needed. These include: Canes. Walkers. Scooters. Crutches. Turn on the lights when you go into a dark area. Replace any light bulbs as soon as they burn out. Set up your furniture so you have a clear path. Avoid moving your furniture around. If any of your floors are uneven, fix them. If there are any pets around you, be aware of where they are. Review your medicines with your doctor. Some medicines can make you feel dizzy. This can increase your chance of falling. Ask your doctor what other things that you can do to help prevent falls. This information is not intended to replace advice given to you by your health care provider. Make sure you discuss any questions you have with your health care provider. Document Released: 08/30/2009 Document Revised: 04/10/2016 Document Reviewed: 12/08/2014 Elsevier Interactive Patient Education  2017 [employer].

## 2021-07-12 ENCOUNTER — Encounter: Payer: Self-pay | Admitting: Family Medicine

## 2021-07-12 ENCOUNTER — Other Ambulatory Visit: Payer: Self-pay

## 2021-07-12 ENCOUNTER — Ambulatory Visit (INDEPENDENT_AMBULATORY_CARE_PROVIDER_SITE_OTHER): Payer: Medicare HMO | Admitting: Family Medicine

## 2021-07-12 VITALS — BP 102/60 | HR 80 | Ht 72.0 in | Wt 133.0 lb

## 2021-07-12 DIAGNOSIS — R69 Illness, unspecified: Secondary | ICD-10-CM | POA: Diagnosis not present

## 2021-07-12 DIAGNOSIS — E785 Hyperlipidemia, unspecified: Secondary | ICD-10-CM | POA: Diagnosis not present

## 2021-07-12 DIAGNOSIS — I679 Cerebrovascular disease, unspecified: Secondary | ICD-10-CM

## 2021-07-12 DIAGNOSIS — I700 Atherosclerosis of aorta: Secondary | ICD-10-CM

## 2021-07-12 DIAGNOSIS — J4520 Mild intermittent asthma, uncomplicated: Secondary | ICD-10-CM

## 2021-07-12 DIAGNOSIS — F419 Anxiety disorder, unspecified: Secondary | ICD-10-CM

## 2021-07-12 MED ORDER — ATORVASTATIN CALCIUM 80 MG PO TABS
80.0000 mg | ORAL_TABLET | Freq: Every day | ORAL | 1 refills | Status: DC
Start: 2021-07-12 — End: 2021-10-09

## 2021-07-12 MED ORDER — SERTRALINE HCL 25 MG PO TABS: 25.0000 mg | ORAL_TABLET | Freq: Every day | ORAL | 3 refills | Status: DC

## 2021-07-12 MED ORDER — CLOPIDOGREL BISULFATE 75 MG PO TABS
ORAL_TABLET | ORAL | 1 refills | Status: DC
Start: 2021-07-12 — End: 2021-10-09

## 2021-07-12 MED ORDER — MONTELUKAST SODIUM 10 MG PO TABS: 10.0000 mg | ORAL_TABLET | Freq: Every day | ORAL | 1 refills | Status: DC

## 2021-07-12 NOTE — Progress Notes (Signed)
Date:  07/12/2021   Name:  Nathaniel Garcia   DOB:  1946/10/10   MRN:  [id number]   Chief Complaint: Hyperlipidemia, cerebal vascular disease, Anxiety, and Asthma (montelukast)  Hyperlipidemia This is a chronic problem. The current episode started more than 1 year ago. The problem is controlled. Recent lipid tests were reviewed and are normal. He has no history of chronic renal disease, diabetes, hypothyroidism, liver disease, obesity or nephrotic syndrome. There are no known factors aggravating his hyperlipidemia. Pertinent negatives include no chest pain, focal sensory loss, focal weakness, leg pain, myalgias or shortness of breath. Current antihyperlipidemic treatment includes statins. The current treatment provides moderate improvement of lipids. There are no compliance problems.  Risk factors for coronary artery disease include dyslipidemia and hypertension.  Anxiety Presents for follow-up visit. Symptoms include excessive worry, insomnia and nervous/anxious behavior. Patient reports no chest pain, compulsions, confusion, decreased concentration, depressed mood, dizziness, dry mouth, feeling of choking, hyperventilation, impotence, irritability, malaise, muscle tension, nausea, obsessions, palpitations, panic, restlessness, shortness of breath or suicidal ideas. Primary symptoms comment: agoraphobia. The severity of symptoms is mild. The quality of sleep is good.   His past medical history is significant for asthma.  Asthma He complains of wheezing. There is no chest tightness, cough, difficulty breathing, frequent throat clearing, hemoptysis, hoarse voice, shortness of breath or sputum production. This is a chronic problem. The problem occurs intermittently. Pertinent negatives include no chest pain, ear pain, fever, headaches, myalgias or sore throat. His past medical history is significant for asthma.   Lab Results  Component Value Date   CREATININE 1.02 01/04/2021   BUN 13  01/04/2021   NA 136 01/04/2021   K 4.4 01/04/2021   CL 104 01/04/2021   CO2 21 (L) 01/04/2021   Lab Results  Component Value Date   CHOL 177 06/29/2018   HDL 54 06/29/2018   LDLCALC 110 (H) 06/29/2018   TRIG 64 06/29/2018   CHOLHDL 3.3 06/29/2018   Lab Results  Component Value Date   TSH 1.650 05/30/2019   No results found for: HGBA1C Lab Results  Component Value Date   WBC 5.0 01/04/2021   HGB 12.6 (L) 01/04/2021   HCT 39.8 01/04/2021   MCV 105.0 (H) 01/04/2021   PLT 194 01/04/2021   Lab Results  Component Value Date   ALT 9 10/29/2020   AST 21 10/29/2020   ALKPHOS 60 10/29/2020   BILITOT 1.4 (H) 10/29/2020     Review of Systems  Constitutional:  Negative for chills, fever and irritability.  HENT:  Negative for drooling, ear discharge, ear pain, hoarse voice and sore throat.   Respiratory:  Positive for wheezing. Negative for cough, hemoptysis, sputum production and shortness of breath.   Cardiovascular:  Negative for chest pain, palpitations and leg swelling.  Gastrointestinal:  Negative for abdominal pain, blood in stool, constipation, diarrhea and nausea.  Endocrine: Negative for polydipsia.  Genitourinary:  Negative for dysuria, frequency, hematuria, impotence and urgency.  Musculoskeletal:  Negative for back pain, myalgias and neck pain.  Skin:  Negative for rash.  Allergic/Immunologic: Negative for environmental allergies.  Neurological:  Negative for dizziness, focal weakness and headaches.  Hematological:  Does not bruise/bleed easily.  Psychiatric/Behavioral:  Negative for confusion, decreased concentration and suicidal ideas. The patient is nervous/anxious and has insomnia.    Patient Active Problem List   Diagnosis Date Noted   COPD with acute exacerbation (HCC) 01/03/2021   Bullous emphysema (HCC) 01/03/2021   Acute exacerbation of chronic  obstructive pulmonary disease (COPD) (HCC) 10/31/2020   COPD exacerbation (HCC) 10/29/2020   Acute  respiratory failure with hypoxia (HCC) 10/29/2020   History of stroke 10/29/2020   Aortic atherosclerosis (HCC) 12/30/2018   Benign hematuria 09/18/2016   Weakness of left hand 07/02/2016   Lipid screening 01/03/2016   Cerebral vascular disease 09/14/2015   Carotid artery narrowing 08/15/2015   Bilateral carotid artery stenosis 08/15/2015   Bradycardia 08/01/2015   H/O transient cerebral ischemia 08/01/2015   Combined fat and carbohydrate induced hyperlipemia 08/01/2015   Breathlessness on exertion 07/30/2015   Absolute anemia 04/03/2015   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   Cerebral vascular accident (HCC) 04/03/2015   HLD (hyperlipidemia) 04/03/2015   Blood glucose elevated 04/03/2015   Lung nodule, solitary 04/03/2015   Non compliance with medical treatment 04/03/2015   Lung nodule, multiple 04/03/2015   Cerebral infarction (HCC) 04/03/2015   Abnormal lung field 04/03/2015    Allergies  Allergen Reactions   Hctz [Hydrochlorothiazide] Rash    Past Surgical History:  Procedure Laterality Date   COLONOSCOPY  2011   normal- [facility]    Social History   Tobacco Use   Smoking status: Former    Packs/day: 0.02    Years: 60.00    Pack years: 1.20    Types: Cigarettes    Quit date: 10/26/2020    Years since quitting: 0.7   Smokeless tobacco: Never   Tobacco comments:    [place] smoking cessation information provided  Vaping Use   Vaping Use: Never used  Substance Use Topics   Alcohol use: Yes    Alcohol/week: 2.0 standard drinks    Types: 2 Cans of beer per week    Comment: weekly   Drug use: Not Currently    Comment: marijuana     Medication list has been reviewed and updated.  Current Meds  Medication Sig   albuterol (VENTOLIN HFA) 108 (90 Base) MCG/ACT inhaler Inhale 2 puffs into the lungs every 6 (six) hours as needed for wheezing or shortness of breath.   atorvastatin (LIPITOR) 80 MG tablet Take 1 tablet (80 mg total) by mouth daily.    clopidogrel (PLAVIX) 75 MG tablet TAKE 1 TABLET(75 MG) BY MOUTH DAILY   montelukast (SINGULAIR) 10 MG tablet Take 1 tablet (10 mg total) by mouth at bedtime.   predniSONE (DELTASONE) 10 MG tablet Take 10 mg by mouth daily with breakfast. Dr Nathaniel Garcia   tamsulosin [facility]) 0.4 MG CAPS capsule TAKE 1 CAPSULE(0.4 MG) BY MOUTH DAILY    PHQ 2/9 Scores 07/12/2021 07/01/2021 03/15/2021 02/28/2021  PHQ - 2 Score 0 0 0 0  PHQ- 9 Score 2 - 0 4    GAD 7 : Generalized Anxiety Score 07/12/2021 07/12/2021 03/15/2021 02/28/2021  Nervous, Anxious, on Edge 1 0 0 0  Control/stop worrying 0 0 0 0  Worry too much - different things 0 0 0 0  Trouble relaxing 1 0 0 0  Restless 1 0 0 0  Easily annoyed or irritable 0 0 0 0  Afraid - awful might happen 0 0 0 0  Total GAD 7 Score 3 0 0 0  Anxiety Difficulty Somewhat difficult - - -    BP Readings from Last 3 Encounters:  07/12/21 102/60  04/11/21 124/80  03/15/21 130/70    Physical Exam Vitals and nursing note reviewed.  HENT:     Head: Normocephalic.     Right Ear: External ear normal.     Left Ear: External ear  normal.     Nose: Nose normal.  Eyes:     General: No scleral icterus.       Right eye: No discharge.        Left eye: No discharge.     Conjunctiva/sclera: Conjunctivae normal.     Pupils: Pupils are equal, round, and reactive to light.  Neck:     Thyroid: No thyromegaly.     Vascular: No JVD.     Trachea: No tracheal deviation.  Cardiovascular:     Rate and Rhythm: Normal rate and regular rhythm.     Heart sounds: Normal heart sounds. No murmur heard.   No friction rub. No gallop.  Pulmonary:     Effort: No respiratory distress.     Breath sounds: Normal breath sounds. No wheezing or rales.  Abdominal:     General: Bowel sounds are normal.     Palpations: Abdomen is soft. There is no mass.     Tenderness: There is no abdominal tenderness. There is no guarding or rebound.  Musculoskeletal:        General: No tenderness. Normal range  of motion.     Cervical back: Normal range of motion and neck supple.  Lymphadenopathy:     Cervical: No cervical adenopathy.  Skin:    General: Skin is warm.     Findings: No rash.  Neurological:     Mental Status: He is alert and oriented to person, place, and time.     Cranial Nerves: No cranial nerve deficit.     Deep Tendon Reflexes: Reflexes are normal and symmetric.    Wt Readings from Last 3 Encounters:  07/12/21 133 lb (60.3 kg)  04/11/21 135 lb 12.8 oz (61.6 kg)  03/15/21 134 lb (60.8 kg)    BP 102/60   Pulse 80   Ht 6' (1.829 m)   Wt 133 lb (60.3 kg)   BMI 18.04 kg/m   Assessment and Plan:  1. Cerebral vascular disease Chronic.  Controlled.  Stable.  Reduce risk of cerebrovascular disease by control of lipid management with Lipitor and continuance of clopidogrel 75 mg once a day. - atorvastatin (LIPITOR) 80 MG tablet; Take 1 tablet (80 mg total) by mouth daily.  Dispense: 90 tablet; Refill: 1 - clopidogrel (PLAVIX) 75 MG tablet; TAKE 1 TABLET(75 MG) BY MOUTH DAILY  Dispense: 90 tablet; Refill: 1  2. Hyperlipidemia, unspecified hyperlipidemia type Chronic.  Controlled.  Stable.  Continue atorvastatin 80 mg once a day. - atorvastatin (LIPITOR) 80 MG tablet; Take 1 tablet (80 mg total) by mouth daily.  Dispense: 90 tablet; Refill: 1  3. Aortic atherosclerosis (HCC) Patient with history of aortic atherosclerosis well-controlled with lipid management, blood pressure management, and continuance of Plavix. - clopidogrel (PLAVIX) 75 MG tablet; TAKE 1 TABLET(75 MG) BY MOUTH DAILY  Dispense: 90 tablet; Refill: 1  4. Anxiety New onset.  Persistent.  Episodic.  With insomnia.  Will treat with sertraline 25 mg nightly we will recheck in 6 to 8 weeks. - sertraline (ZOLOFT) 25 MG tablet; Take 1 tablet (25 mg total) by mouth at bedtime.  Dispense: 30 tablet; Refill: 3  5. Mild intermittent reactive airway disease without complication Chronic.  Controlled.  Stable.  We will  control with Singulair 10 mg once a day. - montelukast (SINGULAIR) 10 MG tablet; Take 1 tablet (10 mg total) by mouth at bedtime.  Dispense: 90 tablet; Refill: 1

## 2021-07-27 DIAGNOSIS — J449 Chronic obstructive pulmonary disease, unspecified: Secondary | ICD-10-CM | POA: Diagnosis not present

## 2021-07-31 ENCOUNTER — Ambulatory Visit: Payer: Self-pay | Admitting: Urology

## 2021-07-31 ENCOUNTER — Ambulatory Visit: Payer: Self-pay

## 2021-07-31 NOTE — Telephone Encounter (Signed)
Noted  KP 

## 2021-07-31 NOTE — Telephone Encounter (Signed)
Reason for Disposition  [1] Weakness of arm / hand, or leg / foot AND [2] is a chronic symptom (recurrent or ongoing AND present > 4 weeks)  Answer Assessment - Initial Assessment Questions 1. SYMPTOM: "What is the main symptom you are concerned about?" (e.g., weakness, numbness)     Tremors 2. ONSET: "When did this start?" (minutes, hours, days; while sleeping)     2 days ago 3. LAST NORMAL: "When was the last time you (the patient) were normal (no symptoms)?"     This week 4. PATTERN "Does this come and go, or has it been constant since it started?"  "Is it present now?"     Comes and goes 5. CARDIAC SYMPTOMS: "Have you had any of the following symptoms: chest pain, difficulty breathing, palpitations?"     No 6. NEUROLOGIC SYMPTOMS: "Have you had any of the following symptoms: headache, dizziness, vision loss, double vision, changes in speech, unsteady on your feet?"     No 7. OTHER SYMPTOMS: "Do you have any other symptoms?"     No 8. PREGNANCY: "Is there any chance you are pregnant?" "When was your last menstrual period?"     N/A  Protocols used: Neurologic Deficit-A-AH

## 2021-07-31 NOTE — Telephone Encounter (Signed)
Pt. Reports he has noticed tremors in his left hand that come and go x 1 week. No weakness of numbness. No other symptoms. Appointment made for tomorrow.

## 2021-08-01 ENCOUNTER — Ambulatory Visit (INDEPENDENT_AMBULATORY_CARE_PROVIDER_SITE_OTHER): Payer: Medicare HMO | Admitting: Family Medicine

## 2021-08-01 ENCOUNTER — Other Ambulatory Visit: Payer: Self-pay

## 2021-08-01 ENCOUNTER — Encounter: Payer: Self-pay | Admitting: Family Medicine

## 2021-08-01 VITALS — BP 110/60 | HR 68 | Ht 72.0 in | Wt 128.0 lb

## 2021-08-01 DIAGNOSIS — D539 Nutritional anemia, unspecified: Secondary | ICD-10-CM

## 2021-08-01 DIAGNOSIS — G250 Essential tremor: Secondary | ICD-10-CM

## 2021-08-01 DIAGNOSIS — J432 Centrilobular emphysema: Secondary | ICD-10-CM

## 2021-08-01 MED ORDER — PROPRANOLOL HCL 10 MG PO TABS
10.0000 mg | ORAL_TABLET | Freq: Every morning | ORAL | 2 refills | Status: DC
Start: 2021-08-01 — End: 2021-10-09

## 2021-08-01 NOTE — Patient Instructions (Signed)
Essential Tremor A tremor is trembling or shaking that a person cannot control. Most tremors affect the hands or arms. Tremors can also affect the head, vocal cords, legs, and other parts of the body. Essential tremor is a tremor without a known cause. Usually, it occurs while a person is trying to perform an action. Ittends to get worse gradually as a person ages. What are the causes? The cause of this condition is not known. What increases the risk? You are more likely to develop this condition if: You have a family member with essential tremor. You are age 40 or older. You take certain medicines. What are the signs or symptoms? The main sign of a tremor is a rhythmic shaking of certain parts of your body that is uncontrolled and unintentional. You may: Have difficulty eating with a spoon or fork. Have difficulty writing. Nod your head up and down or side to side. Have a quivering voice. The shaking may: Get worse over time. Come and go. Be more noticeable on one side of your body. Get worse due to stress, fatigue, caffeine, and extreme heat or cold. How is this diagnosed? This condition may be diagnosed based on: Your symptoms and medical history. A physical exam. There is no single test to diagnose an essential tremor. However, your health care provider may order tests to rule out other causes of your condition. These may include: Blood and urine tests. Imaging studies of your brain, such as CT scan and MRI. A test that measures involuntary muscle movement (electromyogram). How is this treated? Treatment for essential tremor depends on the severity of the condition. Some tremors may go away without treatment. Mild tremors may not need treatment if they do not affect your day-to-day life. Severe tremors may need to be treated using one or more of the following options: Medicines. Lifestyle changes. Occupational or physical therapy. Follow these instructions at  home: Lifestyle  Do not use any products that contain nicotine or tobacco, such as cigarettes and e-cigarettes. If you need help quitting, ask your health care provider. Limit your caffeine intake as told by your health care provider. Try to get 8 hours of sleep each night. Find ways to manage your stress that fits your lifestyle and personality. Consider trying meditation or yoga. Try to anticipate stressful situations and allow extra time to manage them. If you are struggling emotionally with the effects of your tremor, consider working with a mental health provider.  General instructions Take over-the-counter and prescription medicines only as told by your health care provider. Avoid extreme heat and extreme cold. Keep all follow-up visits as told by your health care provider. This is important. Visits may include physical therapy visits. Contact a health care provider if: You experience any changes in the location or intensity of your tremors. You start having a tremor after starting a new medicine. You have tremor with other symptoms, such as: Numbness. Tingling. Pain. Weakness. Your tremor gets worse. Your tremor interferes with your daily life. You feel down, blue, or sad for at least 2 weeks in a row. Worrying about your tremor and what other people think about you interferes with your everyday life functions, including relationships, work, or school. Summary Essential tremor is a tremor without a known cause. Usually, it occurs when you are trying to perform an action. You are more likely to develop this condition if you have a family member with essential tremor. The main sign of a tremor is a rhythmic shaking of   certain parts of your body that is uncontrolled and unintentional. Treatment for essential tremor depends on the severity of the condition. This information is not intended to replace advice given to you by your health care provider. Make sure you discuss any  questions you have with your healthcare provider. Document Revised: 07/27/2020 Document Reviewed: 07/27/2020 Elsevier Patient Education  2022 [employer]  

## 2021-08-01 NOTE — Progress Notes (Addendum)
Date:  08/01/2021   Name:  Nathaniel Garcia   DOB:  07-15-1946   MRN:  [id number]   Chief Complaint: No chief complaint on file.  Neurologic Problem The patient's pertinent negatives include no altered mental status, focal sensory loss, focal weakness, loss of balance, memory loss, slurred speech or weakness. Primary symptoms comment: fine tremor. This is a new problem. The current episode started 1 to 4 weeks ago. The neurological problem developed suddenly. The problem has been waxing and waning since onset. There was upper extremity focality noted. Pertinent negatives include no abdominal pain, back pain, chest pain, dizziness, fever, headaches, nausea, neck pain, palpitations or shortness of breath.   Lab Results  Component Value Date   CREATININE 1.02 01/04/2021   BUN 13 01/04/2021   NA 136 01/04/2021   K 4.4 01/04/2021   CL 104 01/04/2021   CO2 21 (L) 01/04/2021   Lab Results  Component Value Date   CHOL 177 06/29/2018   HDL 54 06/29/2018   LDLCALC 110 (H) 06/29/2018   TRIG 64 06/29/2018   CHOLHDL 3.3 06/29/2018   Lab Results  Component Value Date   TSH 1.650 05/30/2019   No results found for: HGBA1C Lab Results  Component Value Date   WBC 5.0 01/04/2021   HGB 12.6 (L) 01/04/2021   HCT 39.8 01/04/2021   MCV 105.0 (H) 01/04/2021   PLT 194 01/04/2021   Lab Results  Component Value Date   ALT 9 10/29/2020   AST 21 10/29/2020   ALKPHOS 60 10/29/2020   BILITOT 1.4 (H) 10/29/2020     Review of Systems  Constitutional:  Negative for chills and fever.  HENT:  Negative for drooling, ear discharge, ear pain and sore throat.   Respiratory:  Negative for cough, shortness of breath and wheezing.   Cardiovascular:  Negative for chest pain, palpitations and leg swelling.  Gastrointestinal:  Positive for diarrhea. Negative for abdominal pain, blood in stool, constipation and nausea.  Endocrine: Negative for polydipsia.  Genitourinary:  Negative for dysuria,  frequency, hematuria and urgency.  Musculoskeletal:  Negative for back pain, myalgias and neck pain.  Skin:  Negative for rash.  Allergic/Immunologic: Negative for environmental allergies.  Neurological:  Positive for tremors. Negative for dizziness, focal weakness, weakness, headaches and loss of balance.  Hematological:  Does not bruise/bleed easily.  Psychiatric/Behavioral:  Negative for memory loss and suicidal ideas. The patient is not nervous/anxious.    Patient Active Problem List   Diagnosis Date Noted   COPD with acute exacerbation (HCC) 01/03/2021   Bullous emphysema (HCC) 01/03/2021   Acute exacerbation of chronic obstructive pulmonary disease (COPD) (HCC) 10/31/2020   COPD exacerbation (HCC) 10/29/2020   Acute respiratory failure with hypoxia (HCC) 10/29/2020   History of stroke 10/29/2020   Aortic atherosclerosis (HCC) 12/30/2018   Benign hematuria 09/18/2016   Weakness of left hand 07/02/2016   Lipid screening 01/03/2016   Cerebral vascular disease 09/14/2015   Carotid artery narrowing 08/15/2015   Bilateral carotid artery stenosis 08/15/2015   Bradycardia 08/01/2015   H/O transient cerebral ischemia 08/01/2015   Combined fat and carbohydrate induced hyperlipemia 08/01/2015   Breathlessness on exertion 07/30/2015   Absolute anemia 04/03/2015   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   Cerebral vascular accident (HCC) 04/03/2015   HLD (hyperlipidemia) 04/03/2015   Blood glucose elevated 04/03/2015   Lung nodule, solitary 04/03/2015   Non compliance with medical treatment 04/03/2015   Lung nodule, multiple 04/03/2015   Cerebral infarction (HCC) 04/03/2015  Abnormal lung field 04/03/2015    Allergies  Allergen Reactions   Hctz [Hydrochlorothiazide] Rash    Past Surgical History:  Procedure Laterality Date   COLONOSCOPY  2011   normal- [facility] docs    Social History   Tobacco Use   Smoking status: Former    Packs/day: 0.02    Years: 60.00    Pack years:  1.20    Types: Cigarettes    Quit date: 10/26/2020    Years since quitting: 0.7   Smokeless tobacco: Never   Tobacco comments:    [place] smoking cessation information provided  Vaping Use   Vaping Use: Never used  Substance Use Topics   Alcohol use: Yes    Alcohol/week: 2.0 standard drinks    Types: 2 Cans of beer per week    Comment: weekly   Drug use: Not Currently    Comment: marijuana     Medication list has been reviewed and updated.  Current Meds  Medication Sig   albuterol (VENTOLIN HFA) 108 (90 Base) MCG/ACT inhaler Inhale 2 puffs into the lungs every 6 (six) hours as needed for wheezing or shortness of breath.   atorvastatin (LIPITOR) 80 MG tablet Take 1 tablet (80 mg total) by mouth daily.   clopidogrel (PLAVIX) 75 MG tablet TAKE 1 TABLET(75 MG) BY MOUTH DAILY   montelukast (SINGULAIR) 10 MG tablet Take 1 tablet (10 mg total) by mouth at bedtime.   sertraline (ZOLOFT) 25 MG tablet Take 1 tablet (25 mg total) by mouth at bedtime.   tamsulosin (FLOMAX) 0.4 MG CAPS capsule TAKE 1 CAPSULE(0.4 MG) BY MOUTH DAILY    PHQ 2/9 Scores 08/01/2021 07/12/2021 07/01/2021 03/15/2021  PHQ - 2 Score 0 0 0 0  PHQ- 9 Score 0 2 - 0    GAD 7 : Generalized Anxiety Score 08/01/2021 07/12/2021 07/12/2021 03/15/2021  Nervous, Anxious, on Edge 0 1 0 0  Control/stop worrying 0 0 0 0  Worry too much - different things 0 0 0 0  Trouble relaxing 0 1 0 0  Restless 0 1 0 0  Easily annoyed or irritable 0 0 0 0  Afraid - awful might happen 0 0 0 0  Total GAD 7 Score 0 3 0 0  Anxiety Difficulty - Somewhat difficult - -    BP Readings from Last 3 Encounters:  07/12/21 102/60  04/11/21 124/80  03/15/21 130/70    Physical Exam Vitals and nursing note reviewed.  HENT:     Head: Normocephalic.  Eyes:     General: No scleral icterus.       Right eye: No discharge.        Left eye: No discharge.     Conjunctiva/sclera: Conjunctivae normal.     Pupils: Pupils are equal, round, and reactive  to light.  Neck:     Thyroid: No thyromegaly.     Vascular: No JVD.     Trachea: No tracheal deviation.  Cardiovascular:     Rate and Rhythm: Normal rate and regular rhythm.     Heart sounds: Normal heart sounds. No murmur heard.   No friction rub. No gallop.  Pulmonary:     Effort: No respiratory distress.     Breath sounds: Normal breath sounds. No wheezing, rhonchi or rales.  Abdominal:     General: Bowel sounds are normal.     Palpations: Abdomen is soft.     Tenderness: There is no guarding or rebound.  Musculoskeletal:  General: No tenderness.  Lymphadenopathy:     Cervical: No cervical adenopathy.  Skin:    General: Skin is warm.     Findings: No rash.  Neurological:     Mental Status: He is alert and oriented to person, place, and time.     Cranial Nerves: No cranial nerve deficit.     Deep Tendon Reflexes: Reflexes are normal and symmetric.    Wt Readings from Last 3 Encounters:  08/01/21 128 lb (58.1 kg)  07/12/21 133 lb (60.3 kg)  04/11/21 135 lb 12.8 oz (61.6 kg)    Ht 6' (1.829 m)   Wt 128 lb (58.1 kg)   BMI 17.36 kg/m   Assessment and Plan: 1. Benign essential tremor New onset.  Persistent.  Patient has a fine tremor which is not causing issues of holding objects.  Exam is consistent with a fine tremor of essential nature.  We will give a trial of propranolol 10 mg daily a.m. just to see if this may affect the tremor in a positive way. - propranolol (INDERAL) 10 MG tablet; Take 1 tablet (10 mg total) by mouth every morning.  Dispense: 30 tablet; Refill: 2  2. Centrilobular emphysema (HCC) Chronic.  Uncontrolled.  Patient quit in December 2021 smoking.  Patient currently continues to lose weight but Cologuard is been negative in the past and there has been no significant concerns except centrilobular emphysema which may be contributing to weight loss.  3. Macrocytic anemia Review of labs notes that the patient had anemia with a decreased hemoglobin  hematocrit and a macrocytic indices in the past.  We will check recheck his CBC to see if there has been further progression and check a B12 and folic acid at this time. - CBC with Differential/Platelet - B12 and Folate Panel

## 2021-08-02 LAB — CBC WITH DIFFERENTIAL/PLATELET
Basophils Absolute: 0 10*3/uL (ref 0.0–0.2)
Basos: 0 %
EOS (ABSOLUTE): 0.1 10*3/uL (ref 0.0–0.4)
Eos: 2 %
Hematocrit: 40.8 % (ref 37.5–51.0)
Hemoglobin: 14 g/dL (ref 13.0–17.7)
Immature Grans (Abs): 0 10*3/uL (ref 0.0–0.1)
Immature Granulocytes: 0 %
Lymphocytes Absolute: 0.9 10*3/uL (ref 0.7–3.1)
Lymphs: 14 %
MCH: 33.3 pg — ABNORMAL HIGH (ref 26.6–33.0)
MCHC: 34.3 g/dL (ref 31.5–35.7)
MCV: 97 fL (ref 79–97)
Monocytes Absolute: 0.6 10*3/uL (ref 0.1–0.9)
Monocytes: 9 %
Neutrophils Absolute: 5.1 10*3/uL (ref 1.4–7.0)
Neutrophils: 75 %
Platelets: 158 10*3/uL (ref 150–450)
RBC: 4.2 x10E6/uL (ref 4.14–5.80)
RDW: 11.8 % (ref 11.6–15.4)
WBC: 6.8 10*3/uL (ref 3.4–10.8)

## 2021-08-02 LAB — B12 AND FOLATE PANEL
Folate: 10.4 ng/mL (ref >3.0)
Vitamin B-12: 479 pg/mL (ref 232–1245)

## 2021-08-05 ENCOUNTER — Ambulatory Visit (INDEPENDENT_AMBULATORY_CARE_PROVIDER_SITE_OTHER): Payer: Medicare HMO

## 2021-08-05 ENCOUNTER — Other Ambulatory Visit: Payer: Self-pay

## 2021-08-05 ENCOUNTER — Encounter (HOSPITAL_COMMUNITY): Payer: Self-pay

## 2021-08-05 ENCOUNTER — Other Ambulatory Visit: Payer: Self-pay | Admitting: *Deleted

## 2021-08-05 ENCOUNTER — Ambulatory Visit (HOSPITAL_COMMUNITY)
Admission: EM | Admit: 2021-08-05 | Discharge: 2021-08-05 | Disposition: A | Discharge status: Home / Self Care | Payer: Medicare HMO | Attending: Student | Admitting: Student

## 2021-08-05 DIAGNOSIS — Z87891 Personal history of nicotine dependence: Secondary | ICD-10-CM

## 2021-08-05 DIAGNOSIS — R0602 Shortness of breath: Secondary | ICD-10-CM

## 2021-08-05 DIAGNOSIS — R911 Solitary pulmonary nodule: Secondary | ICD-10-CM

## 2021-08-05 DIAGNOSIS — J439 Emphysema, unspecified: Secondary | ICD-10-CM | POA: Diagnosis not present

## 2021-08-05 DIAGNOSIS — F17210 Nicotine dependence, cigarettes, uncomplicated: Secondary | ICD-10-CM

## 2021-08-05 DIAGNOSIS — J441 Chronic obstructive pulmonary disease with (acute) exacerbation: Secondary | ICD-10-CM | POA: Diagnosis not present

## 2021-08-05 MED ORDER — DEXAMETHASONE 1 MG/ML PO CONC
10.0000 mg | Freq: Once | ORAL | Status: AC
  Administered 2021-08-05: 10 mg via ORAL

## 2021-08-05 MED ORDER — DEXAMETHASONE 10 MG/ML FOR PEDIATRIC ORAL USE
INTRAMUSCULAR | Status: AC
  Filled 2021-08-05: qty 1

## 2021-08-05 MED ORDER — PREDNISONE 10 MG (21) PO TBPK: ORAL_TABLET | Freq: Every day | ORAL | 0 refills | Status: DC

## 2021-08-05 NOTE — ED Provider Notes (Signed)
[facility]    CSN: [id number] Arrival date & time: 08/05/21  3086      History   Chief Complaint Chief Complaint  Patient presents with   Shortness of Breath    HPI Nathaniel Garcia is a 75 y.o. male presenting with shortness of breath for 3 days.  Medical history of COPD, acute respiratory failure, emphysema, stroke, smoking.  This patient is followed annually by pulmonology and has annual CT scan given history, he is overdue for this.  States that current symptoms began after he ran out of his daily prednisone, it appears that he takes 10 mg of prednisone chronically.  Ran out of this medication 2 weeks ago, symptoms began 3 days ago.  Endorses shortness of breath, worse with exertion.  Denies cough, fevers, recent URI, chest pain, dizziness, weakness.  Symptoms improved with few puffs of albuterol inhaler, but he requires this every hour.  He does not take other inhalers.  HPI  Past Medical History:  Diagnosis Date   COPD (chronic obstructive pulmonary disease) (HCC)    Hematuria    Hyperlipidemia    Stroke [facility])     Patient Active Problem List   Diagnosis Date Noted   COPD with acute exacerbation (HCC) 01/03/2021   Bullous emphysema (HCC) 01/03/2021   Acute exacerbation of chronic obstructive pulmonary disease (COPD) (HCC) 10/31/2020   COPD exacerbation (HCC) 10/29/2020   Acute respiratory failure with hypoxia (HCC) 10/29/2020   History of stroke 10/29/2020   Aortic atherosclerosis (HCC) 12/30/2018   Benign hematuria 09/18/2016   Weakness of left hand 07/02/2016   Lipid screening 01/03/2016   Cerebral vascular disease 09/14/2015   Carotid artery narrowing 08/15/2015   Bilateral carotid artery stenosis 08/15/2015   Bradycardia 08/01/2015   H/O transient cerebral ischemia 08/01/2015   Combined fat and carbohydrate induced hyperlipemia 08/01/2015   Breathlessness on exertion 07/30/2015   Absolute anemia 04/03/2015   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HCC)  04/03/2015   Cerebral vascular accident (HCC) 04/03/2015   HLD (hyperlipidemia) 04/03/2015   Blood glucose elevated 04/03/2015   Lung nodule, solitary 04/03/2015   Non compliance with medical treatment 04/03/2015   Lung nodule, multiple 04/03/2015   Cerebral infarction (HCC) 04/03/2015   Abnormal lung field 04/03/2015    Past Surgical History:  Procedure Laterality Date   COLONOSCOPY  2011   normal- MD docs       Home Medications    Prior to Admission medications   Medication Sig Start Date End Date Taking? Authorizing Provider  predniSONE (STERAPRED UNI-PAK 21 TAB) 10 MG (21) TBPK tablet Take by mouth daily. Take 6 tabs by mouth daily  for 2 days, then 5 tabs for 2 days, then 4 tabs for 2 days, then 3 tabs for 2 days, 2 tabs for 2 days, then 1 tab by mouth daily for 2 days 08/05/21  Yes Cheree Ditto, Lyman Speller, PA-C  albuterol (VENTOLIN HFA) 108 (90 Base) MCG/ACT inhaler Inhale 2 puffs into the lungs every 6 (six) hours as needed for wheezing or shortness of breath. 05/31/20   Duanne Limerick, MD  atorvastatin (LIPITOR) 80 MG tablet Take 1 tablet (80 mg total) by mouth daily. 07/12/21   Duanne Limerick, MD  clopidogrel (PLAVIX) 75 MG tablet TAKE 1 TABLET(75 MG) BY MOUTH DAILY 07/12/21   Duanne Limerick, MD  montelukast (SINGULAIR) 10 MG tablet Take 1 tablet (10 mg total) by mouth at bedtime. 07/12/21   Duanne Limerick, MD  propranolol (INDERAL) 10 MG tablet  Take 1 tablet (10 mg total) by mouth every morning. 08/01/21   Duanne Limerick, MD  sertraline (ZOLOFT) 25 MG tablet Take 1 tablet (25 mg total) by mouth at bedtime. 07/12/21   Duanne Limerick, MD  tamsulosin (FLOMAX) 0.4 MG CAPS capsule TAKE 1 CAPSULE(0.4 MG) BY MOUTH DAILY 01/14/21   Harle Battiest, PA-C    Family History Family History  Problem Relation Age of Onset   Prostate cancer Neg Hx     Social History Social History   Tobacco Use   Smoking status: Former    Packs/day: 0.02    Years: 60.00    Pack years: 1.20     Types: Cigarettes    Quit date: 10/26/2020    Years since quitting: 0.7   Smokeless tobacco: Never   Tobacco comments:    [place] smoking cessation information provided  Vaping Use   Vaping Use: Never used  Substance Use Topics   Alcohol use: Yes    Alcohol/week: 2.0 standard drinks    Types: 2 Cans of beer per week    Comment: weekly   Drug use: Not Currently    Comment: marijuana     Allergies   Hctz [hydrochlorothiazide]   Review of Systems Review of Systems  Respiratory:  Positive for shortness of breath.   All other systems reviewed and are negative.   Physical Exam Triage Vital Signs ED Triage Vitals  Enc Vitals Group     BP 08/05/21 0921 138/65     Pulse Rate 08/05/21 0921 77     Resp 08/05/21 0921 (!) 28     Temp 08/05/21 0921 97.8 F (36.6 C)     Temp Source 08/05/21 0921 Oral     SpO2 08/05/21 0921 95 %     Weight --      Height --      Head Circumference --      Peak Flow --      Pain Score 08/05/21 0925 0     Pain Loc --      Pain Edu? --      Excl. in GC? --    No data found.  Updated Vital Signs BP 138/65 (BP Location: Right Arm)   Pulse 77   Temp 97.8 F (36.6 C) (Oral)   Resp (!) 24   SpO2 93%   Visual Acuity Right Eye Distance:   Left Eye Distance:   Bilateral Distance:    Right Eye Near:   Left Eye Near:    Bilateral Near:     Physical Exam Vitals reviewed.  Constitutional:      General: He is not in acute distress.    Appearance: Normal appearance. He is not ill-appearing or diaphoretic.  HENT:     Head: Normocephalic and atraumatic.  Cardiovascular:     Rate and Rhythm: Normal rate and regular rhythm.     Heart sounds: Normal heart sounds.  Pulmonary:     Effort: Pulmonary effort is normal. Tachypnea present. No bradypnea, accessory muscle usage, prolonged expiration, respiratory distress or retractions.     Breath sounds: Wheezing and rhonchi present. No decreased breath sounds or rales.     Comments: Wheezes and  rhonchi throughout. Patient breathing comfortably on room air. Skin:    General: Skin is warm.  Neurological:     General: No focal deficit present.     Mental Status: He is alert and oriented to person, place, and time.  Psychiatric:  Mood and Affect: Mood normal.        Behavior: Behavior normal.        Thought Content: Thought content normal.        Judgment: Judgment normal.     UC Treatments / Results  Labs (all labs ordered are listed, but only abnormal results are displayed) Labs Reviewed - No data to display  EKG   Radiology DG Chest 2 View  Result Date: 08/05/2021 CLINICAL DATA:  Shortness of breath. EXAM: CHEST - 2 VIEW COMPARISON:  January 03, 2021. October 29, 2020. July 05, 2020. FINDINGS: The heart size and mediastinal contours are within normal limits. Hyperexpansion of the lungs is noted. Emphysematous disease is noted bilaterally. No pneumothorax or pleural effusion is noted. There is noted slightly increased density seen in right upper lobe compared to prior exam. The visualized skeletal structures are unremarkable. IMPRESSION: Hyperexpansion of the lungs. Slightly increased density is seen in right upper lobe laterally; CT scan of the chest is recommended to rule out pulmonary nodule. Emphysema (ICD10-J43.9). Electronically Signed   By: Lupita Raider M.D.   On: 08/05/2021 09:59    Procedures Procedures (including critical care time)  Medications Ordered in UC Medications  dexamethasone (DECADRON) 1 MG/ML solution 10 mg (10 mg Oral Given 08/05/21 0954)    Initial Impression / Assessment and Plan / UC Course  I have reviewed the triage vital signs and the nursing notes.  Pertinent labs & imaging results that were available during my care of the patient were reviewed by me and considered in my medical decision making (see chart for details).     This patient is a very pleasant 75 y.o. year old male presenting with COPD exacerbation. Afebrile,  nontachy. No recent URI. This patient takes 10 mg of prednisone daily for his chronic COPD, he ran out of this about 2 weeks ago.  Today with 3 days of progressively worsening shortness of breath, temporarily controlled on albuterol inhaler. He is not on home oxygen at baseline.  He adamantly declines Solu-Medrol IM in favor of oral medication, Decadron administered during visit.  CXR Hyperexpansion of the lungs. Slightly increased density is seen in right upper lobe laterally; CT scan of the chest is recommended to rule out pulmonary nodule. Emphysema (ICD10-J43.9).  This patient does have annual CT scans for his history of smoking, he is overdue for this.  Continue albuterol. Prednisone taper as below. F/u with PCP for recheck in 3 days, or ED if symptoms worsen before then.  Could also call pulmonologist to discuss findings.  ED return precautions discussed. Patient verbalizes understanding and agreement.   Coding Level 4 for acute illness with systemic symptoms, and prescription drug management   Final Clinical Impressions(s) / UC Diagnoses   Final diagnoses:  COPD exacerbation (HCC)  Solitary pulmonary nodule  Former smoker     Discharge Instructions      -Prednisone taper for cough/bronchitis. I recommend taking this in the morning as it could give you energy.  Avoid NSAIDs like ibuprofen and alleve while taking this medication as they can increase your risk of stomach upset and even GI bleeding when in combination with a steroid. You can continue tylenol (acetaminophen) up to 1000mg  3x daily. -Albuterol inhaler -There is a small change on your x-ray that we need to get a CT scan to evaluate.  Please follow-up with your primary care in 3 days for a recheck and to schedule this. -If symptoms get worse in the next  3 days, like shortness of breath, new fever/chills, new chest pain-head straight to the emergency department or call 911.     ED Prescriptions     Medication Sig  Dispense Auth. Provider   predniSONE (STERAPRED UNI-PAK 21 TAB) 10 MG (21) TBPK tablet Take by mouth daily. Take 6 tabs by mouth daily  for 2 days, then 5 tabs for 2 days, then 4 tabs for 2 days, then 3 tabs for 2 days, 2 tabs for 2 days, then 1 tab by mouth daily for 2 days 42 tablet Rhys Martini, PA-C      PDMP not reviewed this encounter.   Rhys Martini, PA-C 08/05/21 1028

## 2021-08-05 NOTE — ED Triage Notes (Signed)
Pt reports shortness of breath x 2 weeks. Reports he using 2-3 puff of albuterol inhaler every 1-2 hrs as he can not feel he get enough air in his lungs. States "I'm on my way out"

## 2021-08-05 NOTE — Discharge Instructions (Addendum)
-  Prednisone taper for cough/bronchitis. I recommend taking this in the morning as it could give you energy.  Avoid NSAIDs like ibuprofen and alleve while taking this medication as they can increase your risk of stomach upset and even GI bleeding when in combination with a steroid. You can continue tylenol (acetaminophen) up to 1000mg  3x daily. -Albuterol inhaler -There is a small change on your x-ray that we need to get a CT scan to evaluate.  Please follow-up with your primary care in 3 days for a recheck and to schedule this. -If symptoms get worse in the next 3 days, like shortness of breath, new fever/chills, new chest pain-head straight to the emergency department or call 911.

## 2021-08-07 ENCOUNTER — Ambulatory Visit
Admission: RE | Admit: 2021-08-07 | Discharge: 2021-08-07 | Disposition: A | Discharge status: Home / Self Care | Payer: Medicare HMO | Source: Ambulatory Visit | Attending: Acute Care | Admitting: Acute Care

## 2021-08-07 ENCOUNTER — Other Ambulatory Visit: Payer: Self-pay

## 2021-08-07 DIAGNOSIS — F17210 Nicotine dependence, cigarettes, uncomplicated: Secondary | ICD-10-CM | POA: Diagnosis not present

## 2021-08-07 DIAGNOSIS — J432 Centrilobular emphysema: Secondary | ICD-10-CM | POA: Diagnosis not present

## 2021-08-07 DIAGNOSIS — Z87891 Personal history of nicotine dependence: Secondary | ICD-10-CM | POA: Insufficient documentation

## 2021-08-07 DIAGNOSIS — R69 Illness, unspecified: Secondary | ICD-10-CM | POA: Diagnosis not present

## 2021-08-07 DIAGNOSIS — R918 Other nonspecific abnormal finding of lung field: Secondary | ICD-10-CM | POA: Diagnosis not present

## 2021-08-07 DIAGNOSIS — R0609 Other forms of dyspnea: Secondary | ICD-10-CM | POA: Diagnosis not present

## 2021-08-09 NOTE — Progress Notes (Signed)
08/12/2021 8:39 AM   Nathaniel Garcia 26-Sep-1946 [id number]  Referring provider: Duanne Limerick, MD [address] [place],  [place] [address]  Chief Complaint  Patient presents with   Follow-up    1 year follow-up    Urological history: 1. High risk hematuria -smoker -CTU 09/2016 revealed adrenal glands are unremarkable. No urinary stones. Ureters are decompressed. No filling defects in the intrarenal collecting systems, ureters or bladder.Prostate is enlarged and slightly indents the bladder base. -cystoscopy in January 2018 and no worrisome findings were discovered.  -RUS on 08/12/2018 revealed right kidney is rather small with slight increase in renal echogenicity. Question renal artery stenosis as a potential etiology for these findings. In this regard, question whether patient is hypertensive. Note that similar changes of this nature are not noted on the left.  No obstructing focus in either kidney.  No renal mass evident.  Mildly prominent prostate with mild inhomogeneous echotexture within the prostate.   -Cystoscopy with Dr. Richardo Hanks 07/2019 was NED. -no reports of gross heme -UA 6-10 RBC's  2. BPH with LU TS -PSA 1.9 in 2020 -aged out of screening -I PSS 7/1 -managed with tamsulosin 0.4 mg daily  HPI: Nathaniel Garcia is a 75 y.o. male who presents today for yearly follow up.  No urinary complaints.  Patient denies any modifying or aggravating factors.  Patient denies any gross hematuria, dysuria or suprapubic/flank pain.  Patient denies any fevers, chills, nausea or vomiting.      IPSS     Row Name 08/12/21 0800         International Prostate Symptom Score   How often have you had the sensation of not emptying your bladder? Less than 1 in 5     How often have you had to urinate less than every two hours? Less than half the time     How often have you found you stopped and started again several times when you urinated? Less than 1 in 5  times     How often have you found it difficult to postpone urination? Not at All     How often have you had a weak urinary stream? Less than 1 in 5 times     How often have you had to strain to start urination? Not at All     How many times did you typically get up at night to urinate? 2 Times     Total IPSS Score 7           Quality of Life due to urinary symptoms   If you were to spend the rest of your life with your urinary condition just the way it is now how would you feel about that? Pleased               Score:  1-7 Mild 8-19 Moderate 20-35 Severe   PMH: Past Medical History:  Diagnosis Date   COPD (chronic obstructive pulmonary disease) (HCC)    Hematuria    Hyperlipidemia    Stroke [facility])     Surgical History: Past Surgical History:  Procedure Laterality Date   COLONOSCOPY  2011   normal- MD docs    Home Medications:  Allergies as of 08/12/2021       Reactions   Hctz [hydrochlorothiazide] Rash        Medication List        Accurate as of August 12, 2021  8:39 AM. If you have any questions, ask  your nurse or doctor.          albuterol 108 (90 Base) MCG/ACT inhaler Commonly known as: VENTOLIN HFA Inhale 2 puffs into the lungs every 6 (six) hours as needed for wheezing or shortness of breath.   atorvastatin 80 MG tablet Commonly known as: LIPITOR Take 1 tablet (80 mg total) by mouth daily.   clopidogrel 75 MG tablet Commonly known as: PLAVIX TAKE 1 TABLET(75 MG) BY MOUTH DAILY   montelukast 10 MG tablet Commonly known as: SINGULAIR Take 1 tablet (10 mg total) by mouth at bedtime.   predniSONE 5 MG tablet Commonly known as: DELTASONE Take 5 mg by mouth daily. What changed: Another medication with the same name was removed. Continue taking this medication, and follow the directions you see here. Changed by: Michiel Cowboy, PA-C   propranolol 10 MG tablet Commonly known as: INDERAL Take 1 tablet (10 mg total) by mouth every  morning.   sertraline 25 MG tablet Commonly known as: ZOLOFT Take 1 tablet (25 mg total) by mouth at bedtime.   tamsulosin 0.4 MG Caps capsule Commonly known as: FLOMAX TAKE 1 CAPSULE(0.4 MG) BY MOUTH DAILY        Allergies:  Allergies  Allergen Reactions   Hctz [Hydrochlorothiazide] Rash    Family History: Family History  Problem Relation Age of Onset   Prostate cancer Neg Hx     Social History:  reports that he quit smoking about 9 months ago. His smoking use included cigarettes. He has a 1.20 pack-year smoking history. He has never used smokeless tobacco. He reports current alcohol use of about 2.0 standard drinks per week. He reports that he does not currently use drugs.  ROS: For pertinent review of systems please refer to history of present illness  Physical Exam: BP (!) 159/61   Pulse 86   Ht 6\' 1"  (1.854 m)   Wt 134 lb (60.8 kg)   BMI 17.68 kg/m   Constitutional:  Well nourished. Alert and oriented, No acute distress. HEENT: [place] AT, mask in place.  Trachea midline Cardiovascular: No clubbing, cyanosis, or edema. Respiratory: Normal respiratory effort, no increased work of breathing. Neurologic: Grossly intact, no focal deficits, moving all 4 extremities. Psychiatric: Normal mood and affect.   Laboratory Data: Component     Latest Ref Rng & Units 08/01/2021          WBC     3.4 - 10.8 x10E3/uL 6.8  RBC     4.14 - 5.80 x10E6/uL 4.20  Hemoglobin     13.0 - 17.7 g/dL 08/03/2021  HCT     24.0 - 97.3 % 40.8  MCV     79 - 97 fL 97  MCH     26.6 - 33.0 pg 33.3 (H)  MCHC     31.5 - 35.7 g/dL 53.2  RDW     99.2 - 42.6 % 11.8  Platelets     150 - 450 x10E3/uL 158  Neutrophils     Not Estab. % 75  Lymphs     Not Estab. % 14  Monocytes     Not Estab. % 9  Eos     Not Estab. % 2  Basos     Not Estab. % 0  NEUT#     1.4 - 7.0 x10E3/uL 5.1  Lymphocyte #     0.7 - 3.1 x10E3/uL 0.9  Monocytes Absolute     0.1 - 0.9 x10E3/uL 0.6  EOS (ABSOLUTE)  0.0 - 0.4 x10E3/uL 0.1  Basophils Absolute     0.0 - 0.2 x10E3/uL 0.0  Immature Granulocytes     Not Estab. % 0  Immature Grans (Abs)     0.0 - 0.1 x10E3/uL 0.0  pH, Arterial     7.350 - 7.450   pCO2 arterial     32.0 - 48.0 mmHg   pO2, Arterial     83.0 - 108.0 mmHg   Bicarbonate     20.0 - 28.0 mmol/L   TCO2     22 - 32 mmol/L   O2 Saturation     %   Acid-base deficit     0.0 - 2.0 mmol/L   Sodium     135 - 145 mmol/L   Potassium     3.5 - 5.1 mmol/L   Calcium Ionized     1.15 - 1.40 mmol/L   Patient temperature        Collection site        Drawn by        Sample type        nRBC     0.0 - 0.2 %   WBC, UA     0 - 5 /hpf   Epithelial Cells (non renal)     0 - 10 /hpf   Bacteria, UA     None seen/Few      Lab Results  Component Value Date   CREATININE 1.02 01/04/2021       Component Value Date/Time   CHOL 177 06/29/2018 1043   HDL 54 06/29/2018 1043   CHOLHDL 3.3 06/29/2018 1043   LDLCALC 110 (H) 06/29/2018 1043   Urinalysis Component     Latest Ref Rng & Units 08/12/2021  Color, Urine     YELLOW YELLOW  Appearance     CLEAR CLEAR  Specific Gravity, Urine     1.005 - 1.030 1.025  pH     5.0 - 8.0 5.5  Glucose, UA     NEGATIVE mg/dL NEGATIVE  Hgb urine dipstick     NEGATIVE TRACE (A)  Bilirubin Urine     NEGATIVE NEGATIVE  Ketones, ur     NEGATIVE mg/dL TRACE (A)  Protein     NEGATIVE mg/dL NEGATIVE  Nitrite     NEGATIVE NEGATIVE  Leukocytes,Ua     NEGATIVE NEGATIVE  Squamous Epithelial / LPF     0 - 5 0-5  Non Squamous Epithelial     NONE SEEN PRESENT (A)  WBC, UA     0 - 5 WBC/hpf 0-5  RBC / HPF     0 - 5 RBC/hpf 6-10  Bacteria, UA     NONE SEEN FEW (A)  I have reviewed the labs  Pertinent Imaging N/A   Assessment & Plan:    1. BPH with LUTS -UA 6-10 RBC's -continue conservative management, avoiding bladder irritants and timed voiding's -continue tamsulosin 0.4 mg daily-refills given   2. High risk  hematuria/Microscopic hematuria -Hematuria work up completed in 07/2019 - findings positive for BPH -No report of gross hematuria  -UA today positive for micro heme - will send for culture - if negative, pursue CT urogram -if positive, treat UTI and recheck UA for micro heme -Patient to report any gross hematuria in the interim              Return in about 1 year (around 08/12/2022) for I PSS and UA .  These notes generated with voice recognition software.  I apologize for typographical errors.  Michiel Cowboy, PA-C  [facility] [address] [place], [place] [address] [phone]

## 2021-08-12 ENCOUNTER — Other Ambulatory Visit
Admission: RE | Admit: 2021-08-12 | Discharge: 2021-08-12 | Disposition: A | Discharge status: Home / Self Care | Payer: Medicare HMO | Attending: Urology | Admitting: Urology

## 2021-08-12 ENCOUNTER — Other Ambulatory Visit: Payer: Self-pay | Admitting: *Deleted

## 2021-08-12 ENCOUNTER — Encounter: Payer: Self-pay | Admitting: Urology

## 2021-08-12 ENCOUNTER — Ambulatory Visit (INDEPENDENT_AMBULATORY_CARE_PROVIDER_SITE_OTHER): Payer: Medicare HMO | Admitting: Urology

## 2021-08-12 ENCOUNTER — Other Ambulatory Visit: Payer: Self-pay

## 2021-08-12 VITALS — BP 159/61 | HR 86 | Ht 73.0 in | Wt 134.0 lb

## 2021-08-12 DIAGNOSIS — N401 Enlarged prostate with lower urinary tract symptoms: Secondary | ICD-10-CM

## 2021-08-12 DIAGNOSIS — R319 Hematuria, unspecified: Secondary | ICD-10-CM | POA: Insufficient documentation

## 2021-08-12 DIAGNOSIS — N138 Other obstructive and reflux uropathy: Secondary | ICD-10-CM

## 2021-08-12 LAB — URINALYSIS, COMPLETE (UACMP) WITH MICROSCOPIC
Bilirubin Urine: NEGATIVE
Glucose, UA: NEGATIVE mg/dL
Leukocytes,Ua: NEGATIVE
Nitrite: NEGATIVE
Protein, ur: NEGATIVE mg/dL
Specific Gravity, Urine: 1.025 (ref 1.005–1.030)
pH: 5.5 (ref 5.0–8.0)

## 2021-08-12 MED ORDER — TAMSULOSIN HCL 0.4 MG PO CAPS: ORAL_CAPSULE | ORAL | 3 refills | Status: DC

## 2021-08-13 ENCOUNTER — Other Ambulatory Visit: Payer: Self-pay | Admitting: Acute Care

## 2021-08-13 DIAGNOSIS — Z87891 Personal history of nicotine dependence: Secondary | ICD-10-CM

## 2021-08-13 LAB — URINE CULTURE: Culture: <10000 — AB

## 2021-08-26 DIAGNOSIS — J449 Chronic obstructive pulmonary disease, unspecified: Secondary | ICD-10-CM | POA: Diagnosis not present

## 2021-08-27 ENCOUNTER — Ambulatory Visit: Payer: Medicare HMO | Admitting: Family Medicine

## 2021-08-27 ENCOUNTER — Emergency Department (HOSPITAL_COMMUNITY): Payer: Medicare HMO

## 2021-08-27 ENCOUNTER — Other Ambulatory Visit: Payer: Self-pay

## 2021-08-27 ENCOUNTER — Inpatient Hospital Stay (HOSPITAL_COMMUNITY)
Admission: EM | Admit: 2021-08-27 | Discharge: 2021-09-04 | DRG: 190 | Disposition: A | Discharge status: Home Health | Payer: Medicare HMO | Attending: Internal Medicine | Admitting: Internal Medicine

## 2021-08-27 ENCOUNTER — Encounter (HOSPITAL_COMMUNITY): Payer: Self-pay

## 2021-08-27 DIAGNOSIS — T380X5A Adverse effect of glucocorticoids and synthetic analogues, initial encounter: Secondary | ICD-10-CM | POA: Diagnosis not present

## 2021-08-27 DIAGNOSIS — J9601 Acute respiratory failure with hypoxia: Secondary | ICD-10-CM | POA: Diagnosis present

## 2021-08-27 DIAGNOSIS — Z5320 Procedure and treatment not carried out because of patient's decision for unspecified reasons: Secondary | ICD-10-CM | POA: Diagnosis not present

## 2021-08-27 DIAGNOSIS — J439 Emphysema, unspecified: Secondary | ICD-10-CM | POA: Diagnosis not present

## 2021-08-27 DIAGNOSIS — N400 Enlarged prostate without lower urinary tract symptoms: Secondary | ICD-10-CM | POA: Diagnosis present

## 2021-08-27 DIAGNOSIS — J449 Chronic obstructive pulmonary disease, unspecified: Secondary | ICD-10-CM | POA: Diagnosis not present

## 2021-08-27 DIAGNOSIS — I10 Essential (primary) hypertension: Secondary | ICD-10-CM | POA: Diagnosis not present

## 2021-08-27 DIAGNOSIS — R911 Solitary pulmonary nodule: Secondary | ICD-10-CM | POA: Diagnosis not present

## 2021-08-27 DIAGNOSIS — R451 Restlessness and agitation: Secondary | ICD-10-CM | POA: Diagnosis not present

## 2021-08-27 DIAGNOSIS — Z888 Allergy status to other drugs, medicaments and biological substances status: Secondary | ICD-10-CM | POA: Diagnosis not present

## 2021-08-27 DIAGNOSIS — Z8673 Personal history of transient ischemic attack (TIA), and cerebral infarction without residual deficits: Secondary | ICD-10-CM | POA: Diagnosis not present

## 2021-08-27 DIAGNOSIS — R197 Diarrhea, unspecified: Secondary | ICD-10-CM | POA: Diagnosis not present

## 2021-08-27 DIAGNOSIS — R410 Disorientation, unspecified: Secondary | ICD-10-CM | POA: Diagnosis not present

## 2021-08-27 DIAGNOSIS — R0603 Acute respiratory distress: Secondary | ICD-10-CM | POA: Diagnosis not present

## 2021-08-27 DIAGNOSIS — R0689 Other abnormalities of breathing: Secondary | ICD-10-CM | POA: Diagnosis not present

## 2021-08-27 DIAGNOSIS — R69 Illness, unspecified: Secondary | ICD-10-CM | POA: Diagnosis not present

## 2021-08-27 DIAGNOSIS — Z87891 Personal history of nicotine dependence: Secondary | ICD-10-CM | POA: Diagnosis not present

## 2021-08-27 DIAGNOSIS — Z20822 Contact with and (suspected) exposure to covid-19: Secondary | ICD-10-CM | POA: Diagnosis not present

## 2021-08-27 DIAGNOSIS — J441 Chronic obstructive pulmonary disease with (acute) exacerbation: Principal | ICD-10-CM

## 2021-08-27 DIAGNOSIS — Z7952 Long term (current) use of systemic steroids: Secondary | ICD-10-CM

## 2021-08-27 DIAGNOSIS — I700 Atherosclerosis of aorta: Secondary | ICD-10-CM | POA: Diagnosis present

## 2021-08-27 DIAGNOSIS — Z743 Need for continuous supervision: Secondary | ICD-10-CM | POA: Diagnosis not present

## 2021-08-27 DIAGNOSIS — Z79899 Other long term (current) drug therapy: Secondary | ICD-10-CM

## 2021-08-27 DIAGNOSIS — E785 Hyperlipidemia, unspecified: Secondary | ICD-10-CM | POA: Diagnosis present

## 2021-08-27 DIAGNOSIS — Z7902 Long term (current) use of antithrombotics/antiplatelets: Secondary | ICD-10-CM

## 2021-08-27 DIAGNOSIS — R0600 Dyspnea, unspecified: Secondary | ICD-10-CM | POA: Diagnosis not present

## 2021-08-27 DIAGNOSIS — R0902 Hypoxemia: Secondary | ICD-10-CM | POA: Diagnosis not present

## 2021-08-27 LAB — I-STAT ARTERIAL BLOOD GAS, ED
Acid-Base Excess: 1 mmol/L (ref 0.0–2.0)
Bicarbonate: 26.6 mmol/L (ref 20.0–28.0)
Calcium, Ion: 1.19 mmol/L (ref 1.15–1.40)
HCT: 38 % — ABNORMAL LOW (ref 39.0–52.0)
Hemoglobin: 12.9 g/dL — ABNORMAL LOW (ref 13.0–17.0)
O2 Saturation: 100 %
Patient temperature: 98.6
Potassium: 4.1 mmol/L (ref 3.5–5.1)
Sodium: 138 mmol/L (ref 135–145)
TCO2: 28 mmol/L (ref 22–32)
pCO2 arterial: 46.9 mmHg (ref 32.0–48.0)
pH, Arterial: 7.362 (ref 7.350–7.450)
pO2, Arterial: 193 mmHg — ABNORMAL HIGH (ref 83.0–108.0)

## 2021-08-27 LAB — COMPREHENSIVE METABOLIC PANEL
ALT: 16 U/L (ref 0–44)
AST: 16 U/L (ref 15–41)
Albumin: 3.4 g/dL — ABNORMAL LOW (ref 3.5–5.0)
Alkaline Phosphatase: 47 U/L (ref 38–126)
Anion gap: 7 (ref 5–15)
BUN: 14 mg/dL (ref 8–23)
CO2: 24 mmol/L (ref 22–32)
Calcium: 8.7 mg/dL — ABNORMAL LOW (ref 8.9–10.3)
Chloride: 108 mmol/L (ref 98–111)
Creatinine, Ser: 0.97 mg/dL (ref 0.61–1.24)
GFR, Estimated: >60 mL/min (ref >60)
Glucose, Bld: 113 mg/dL — ABNORMAL HIGH (ref 70–99)
Potassium: 4.3 mmol/L (ref 3.5–5.1)
Sodium: 139 mmol/L (ref 135–145)
Total Bilirubin: 0.2 mg/dL — ABNORMAL LOW (ref 0.3–1.2)
Total Protein: 6.1 g/dL — ABNORMAL LOW (ref 6.5–8.1)

## 2021-08-27 LAB — CBC WITH DIFFERENTIAL/PLATELET
Abs Immature Granulocytes: 0.02 10*3/uL (ref 0.00–0.07)
Basophils Absolute: 0 10*3/uL (ref 0.0–0.1)
Basophils Relative: 0 %
Eosinophils Absolute: 0.3 10*3/uL (ref 0.0–0.5)
Eosinophils Relative: 3 %
HCT: 43.1 % (ref 39.0–52.0)
Hemoglobin: 14 g/dL (ref 13.0–17.0)
Immature Granulocytes: 0 %
Lymphocytes Relative: 23 %
Lymphs Abs: 2.4 10*3/uL (ref 0.7–4.0)
MCH: 34.1 pg — ABNORMAL HIGH (ref 26.0–34.0)
MCHC: 32.5 g/dL (ref 30.0–36.0)
MCV: 105.1 fL — ABNORMAL HIGH (ref 80.0–100.0)
Monocytes Absolute: 0.8 10*3/uL (ref 0.1–1.0)
Monocytes Relative: 8 %
Neutro Abs: 6.9 10*3/uL (ref 1.7–7.7)
Neutrophils Relative %: 66 %
Platelets: 182 10*3/uL (ref 150–400)
RBC: 4.1 MIL/uL — ABNORMAL LOW (ref 4.22–5.81)
RDW: 13.3 % (ref 11.5–15.5)
WBC: 10.5 10*3/uL (ref 4.0–10.5)
nRBC: 0 % (ref 0.0–0.2)

## 2021-08-27 LAB — TROPONIN I (HIGH SENSITIVITY)
Troponin I (High Sensitivity): 15 ng/L (ref <18)
Troponin I (High Sensitivity): 25 ng/L — ABNORMAL HIGH (ref <18)

## 2021-08-27 LAB — RESP PANEL BY RT-PCR (FLU A&B, COVID) ARPGX2
Influenza A by PCR: NEGATIVE
Influenza B by PCR: NEGATIVE
SARS Coronavirus 2 by RT PCR: NEGATIVE

## 2021-08-27 LAB — D-DIMER, QUANTITATIVE: D-Dimer, Quant: 0.37 ug/mL-FEU (ref 0.00–0.50)

## 2021-08-27 LAB — BRAIN NATRIURETIC PEPTIDE: B Natriuretic Peptide: 38.8 pg/mL (ref 0.0–100.0)

## 2021-08-27 MED ORDER — TRAZODONE HCL 50 MG PO TABS
50.0000 mg | ORAL_TABLET | Freq: Every evening | ORAL | Status: AC | PRN
  Administered 2021-08-27 – 2021-08-28 (×2): 50 mg via ORAL
  Filled 2021-08-27 (×2): qty 1

## 2021-08-27 MED ORDER — ONDANSETRON HCL 4 MG/2ML IJ SOLN: 4.0000 mg | Freq: Four times a day (QID) | INTRAMUSCULAR | Status: DC | PRN

## 2021-08-27 MED ORDER — ONDANSETRON HCL 4 MG PO TABS: 4.0000 mg | ORAL_TABLET | Freq: Four times a day (QID) | ORAL | Status: DC | PRN

## 2021-08-27 MED ORDER — NICOTINE 14 MG/24HR TD PT24
14.0000 mg | MEDICATED_PATCH | Freq: Every day | TRANSDERMAL | Status: DC
  Administered 2021-08-27 – 2021-08-28 (×2): 14 mg via TRANSDERMAL
  Filled 2021-08-27 (×2): qty 1

## 2021-08-27 MED ORDER — MONTELUKAST SODIUM 10 MG PO TABS
10.0000 mg | ORAL_TABLET | Freq: Every day | ORAL | Status: DC
  Administered 2021-08-27 – 2021-09-03 (×7): 10 mg via ORAL
  Filled 2021-08-27 (×9): qty 1

## 2021-08-27 MED ORDER — ENOXAPARIN SODIUM 40 MG/0.4ML IJ SOSY
40.0000 mg | PREFILLED_SYRINGE | INTRAMUSCULAR | Status: DC
  Administered 2021-08-27 – 2021-09-03 (×7): 40 mg via SUBCUTANEOUS
  Filled 2021-08-27 (×7): qty 0.4

## 2021-08-27 MED ORDER — IPRATROPIUM-ALBUTEROL 0.5-2.5 (3) MG/3ML IN SOLN
3.0000 mL | RESPIRATORY_TRACT | Status: DC | PRN
  Administered 2021-08-27 – 2021-08-31 (×7): 3 mL via RESPIRATORY_TRACT
  Filled 2021-08-27 (×8): qty 3

## 2021-08-27 MED ORDER — TAMSULOSIN HCL 0.4 MG PO CAPS
0.4000 mg | ORAL_CAPSULE | Freq: Every day | ORAL | Status: DC
  Administered 2021-08-27 – 2021-09-04 (×8): 0.4 mg via ORAL
  Filled 2021-08-27 (×8): qty 1

## 2021-08-27 MED ORDER — PROPRANOLOL HCL 10 MG PO TABS
10.0000 mg | ORAL_TABLET | Freq: Every morning | ORAL | Status: DC
  Administered 2021-08-27 – 2021-09-04 (×7): 10 mg via ORAL
  Filled 2021-08-27 (×10): qty 1

## 2021-08-27 MED ORDER — METHYLPREDNISOLONE SODIUM SUCC 125 MG IJ SOLR: 125.0000 mg | Freq: Once | INTRAMUSCULAR | Status: DC

## 2021-08-27 MED ORDER — IPRATROPIUM BROMIDE 0.02 % IN SOLN
RESPIRATORY_TRACT | Status: AC
  Administered 2021-08-27: 0.5 mg
  Filled 2021-08-27: qty 2.5

## 2021-08-27 MED ORDER — METHYLPREDNISOLONE SODIUM SUCC 125 MG IJ SOLR
60.0000 mg | Freq: Two times a day (BID) | INTRAMUSCULAR | Status: DC
  Administered 2021-08-27 – 2021-08-28 (×3): 60 mg via INTRAVENOUS
  Filled 2021-08-27 (×3): qty 2

## 2021-08-27 MED ORDER — ATORVASTATIN CALCIUM 80 MG PO TABS
80.0000 mg | ORAL_TABLET | Freq: Every day | ORAL | Status: DC
  Administered 2021-08-27 – 2021-09-04 (×8): 80 mg via ORAL
  Filled 2021-08-27: qty 1
  Filled 2021-08-27: qty 2
  Filled 2021-08-27 (×6): qty 1

## 2021-08-27 MED ORDER — SERTRALINE HCL 25 MG PO TABS
25.0000 mg | ORAL_TABLET | Freq: Every day | ORAL | Status: DC
  Administered 2021-08-27 – 2021-09-03 (×7): 25 mg via ORAL
  Filled 2021-08-27 (×8): qty 1

## 2021-08-27 MED ORDER — LACTATED RINGERS IV SOLN: INTRAVENOUS | Status: AC

## 2021-08-27 MED ORDER — ACETAMINOPHEN 325 MG PO TABS
650.0000 mg | ORAL_TABLET | Freq: Four times a day (QID) | ORAL | Status: DC | PRN
  Administered 2021-08-28 – 2021-08-30 (×2): 650 mg via ORAL
  Filled 2021-08-27 (×2): qty 2

## 2021-08-27 MED ORDER — ACETAMINOPHEN 650 MG RE SUPP: 650.0000 mg | Freq: Four times a day (QID) | RECTAL | Status: DC | PRN

## 2021-08-27 MED ORDER — CLOPIDOGREL BISULFATE 75 MG PO TABS
75.0000 mg | ORAL_TABLET | Freq: Every day | ORAL | Status: DC
  Administered 2021-08-27 – 2021-09-04 (×8): 75 mg via ORAL
  Filled 2021-08-27 (×8): qty 1

## 2021-08-27 MED ORDER — LORAZEPAM 2 MG/ML IJ SOLN
0.5000 mg | Freq: Four times a day (QID) | INTRAMUSCULAR | Status: DC | PRN
  Administered 2021-08-27 – 2021-09-01 (×7): 0.5 mg via INTRAVENOUS
  Filled 2021-08-27 (×7): qty 1

## 2021-08-27 MED ORDER — ALBUTEROL SULFATE (2.5 MG/3ML) 0.083% IN NEBU
10.0000 mg/h | INHALATION_SOLUTION | Freq: Once | RESPIRATORY_TRACT | Status: AC
  Administered 2021-08-27: 10 mg/h via RESPIRATORY_TRACT

## 2021-08-27 MED ORDER — IPRATROPIUM-ALBUTEROL 0.5-2.5 (3) MG/3ML IN SOLN
3.0000 mL | Freq: Four times a day (QID) | RESPIRATORY_TRACT | Status: DC
  Administered 2021-08-27 (×4): 3 mL via RESPIRATORY_TRACT
  Filled 2021-08-27 (×4): qty 3

## 2021-08-27 MED ORDER — IPRATROPIUM BROMIDE 0.02 % IN SOLN: 1.0000 mg | Freq: Once | RESPIRATORY_TRACT | Status: AC

## 2021-08-27 MED ORDER — POLYETHYLENE GLYCOL 3350 17 G PO PACK: 17.0000 g | PACK | Freq: Every day | ORAL | Status: DC | PRN

## 2021-08-27 NOTE — ED Notes (Signed)
Provider at bedside

## 2021-08-27 NOTE — ED Notes (Signed)
RT at bedside.

## 2021-08-27 NOTE — ED Provider Notes (Signed)
[facility] EMERGENCY DEPARTMENT Provider Note   CSN: [id number] Arrival date & time: 08/27/21  7782     History Chief Complaint  Patient presents with   Shortness of Breath    Giovonnie Trettel is a 75 y.o. male.  75 year old male with history of COPD and stroke who presents emerged from today with shortness of breath.  Patient states that this started a few hours ago earlier in the day.  Patient states is progressively worsened and nothing he is doing at home including inhalers is helping.  He states he does have a history of COPD.  EMS brought him in.  Did not receive report from them.  Patient denies fever, cough, productive cough, chest pain.   Shortness of Breath     Past Medical History:  Diagnosis Date   COPD (chronic obstructive pulmonary disease) (HCC)    Hematuria    Hyperlipidemia    Stroke [facility])     Patient Active Problem List   Diagnosis Date Noted   COPD with acute exacerbation (HCC) 01/03/2021   Bullous emphysema (HCC) 01/03/2021   Acute exacerbation of chronic obstructive pulmonary disease (COPD) (HCC) 10/31/2020   COPD exacerbation (HCC) 10/29/2020   Acute respiratory failure with hypoxia (HCC) 10/29/2020   History of stroke 10/29/2020   Aortic atherosclerosis (HCC) 12/30/2018   Benign hematuria 09/18/2016   Weakness of left hand 07/02/2016   Lipid screening 01/03/2016   Cerebral vascular disease 09/14/2015   Carotid artery narrowing 08/15/2015   Bilateral carotid artery stenosis 08/15/2015   Bradycardia 08/01/2015   H/O transient cerebral ischemia 08/01/2015   Combined fat and carbohydrate induced hyperlipemia 08/01/2015   Breathlessness on exertion 07/30/2015   Absolute anemia 04/03/2015   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   Cerebral vascular accident (HCC) 04/03/2015   HLD (hyperlipidemia) 04/03/2015   Blood glucose elevated 04/03/2015   Lung nodule, solitary 04/03/2015   Non compliance with medical treatment  04/03/2015   Lung nodule, multiple 04/03/2015   Cerebral infarction (HCC) 04/03/2015   Abnormal lung field 04/03/2015    Past Surgical History:  Procedure Laterality Date   COLONOSCOPY  2011   normal- MD docs       Family History  Problem Relation Age of Onset   Prostate cancer Neg Hx     Social History   Tobacco Use   Smoking status: Former    Packs/day: 0.02    Years: 60.00    Pack years: 1.20    Types: Cigarettes    Quit date: 10/26/2020    Years since quitting: 0.8   Smokeless tobacco: Never   Tobacco comments:     smoking cessation information provided  Vaping Use   Vaping Use: Never used  Substance Use Topics   Alcohol use: Yes    Alcohol/week: 2.0 standard drinks    Types: 2 Cans of beer per week    Comment: weekly   Drug use: Not Currently    Comment: marijuana    Home Medications Prior to Admission medications   Medication Sig Start Date End Date Taking? Authorizing Provider  albuterol (VENTOLIN HFA) 108 (90 Base) MCG/ACT inhaler Inhale 2 puffs into the lungs every 6 (six) hours as needed for wheezing or shortness of breath. 05/31/20   Duanne Limerick, MD  atorvastatin (LIPITOR) 80 MG tablet Take 1 tablet (80 mg total) by mouth daily. 07/12/21   Duanne Limerick, MD  clopidogrel (PLAVIX) 75 MG tablet TAKE 1 TABLET(75 MG) BY MOUTH DAILY 07/12/21  Duanne Limerick, MD  montelukast (SINGULAIR) 10 MG tablet Take 1 tablet (10 mg total) by mouth at bedtime. 07/12/21   Duanne Limerick, MD  predniSONE (DELTASONE) 5 MG tablet Take 5 mg by mouth daily. 08/07/21   [provider]  propranolol (INDERAL) 10 MG tablet Take 1 tablet (10 mg total) by mouth every morning. 08/01/21   Duanne Limerick, MD  sertraline (ZOLOFT) 25 MG tablet Take 1 tablet (25 mg total) by mouth at bedtime. 07/12/21   Duanne Limerick, MD  tamsulosin (FLOMAX) 0.4 MG CAPS capsule TAKE 1 CAPSULE(0.4 MG) BY MOUTH DAILY 08/12/21   Michiel Cowboy A, PA-C    Allergies    Hctz  [hydrochlorothiazide]  Review of Systems   Review of Systems  Respiratory:  Positive for shortness of breath.   All other systems reviewed and are negative.  Physical Exam Updated Vital Signs BP (!) 146/65   Pulse 68   Temp 97.6 F (36.4 C) (Axillary)   Resp (!) 37   Ht 6\' 1"  (1.854 m)   Wt 60.8 kg   SpO2 100%   BMI 17.68 kg/m   Physical Exam Vitals and nursing note reviewed.  Constitutional:      Appearance: He is well-developed.  HENT:     Head: Normocephalic and atraumatic.     Mouth/Throat:     Mouth: Mucous membranes are moist.     Pharynx: Oropharynx is clear.  Eyes:     Pupils: Pupils are equal, round, and reactive to light.  Cardiovascular:     Rate and Rhythm: Normal rate.  Pulmonary:     Effort: Tachypnea, accessory muscle usage and respiratory distress present.     Breath sounds: Decreased breath sounds and wheezing present.  Abdominal:     General: There is no distension.  Musculoskeletal:        General: Normal range of motion.     Cervical back: Normal range of motion.  Neurological:     General: No focal deficit present.     Mental Status: He is alert.    ED Results / Procedures / Treatments   Labs (all labs ordered are listed, but only abnormal results are displayed) Labs Reviewed  CBC WITH DIFFERENTIAL/PLATELET - Abnormal; Notable for the following components:      Result Value   RBC 4.10 (*)    MCV 105.1 (*)    MCH 34.1 (*)    All other components within normal limits  RESP PANEL BY RT-PCR (FLU A&B, COVID) ARPGX2  COMPREHENSIVE METABOLIC PANEL  BRAIN NATRIURETIC PEPTIDE  TROPONIN I (HIGH SENSITIVITY)    EKG None  Radiology DG Chest Portable 1 View  Result Date: 08/27/2021 CLINICAL DATA:  Dyspnea.  COPD, emphysema. EXAM: PORTABLE CHEST 1 VIEW COMPARISON:  08/05/2021 FINDINGS: The lungs are symmetrically hyperinflated in keeping with changes of underlying COPD, similar to prior examination. No superimposed focal pulmonary  infiltrate. Benign calcified granuloma within the left mid lung zone. No pneumothorax or pleural effusion. Cardiac size within normal limits. Pulmonary vascularity is normal. No acute bone abnormality. IMPRESSION: No active disease.  COPD. Electronically Signed   By: 08/07/2021 M.D.   On: 08/27/2021 03:27    Procedures .Critical Care E&M Performed by: 10/27/2021, MD  Critical care provider statement:    Critical care time (minutes):  35   Critical care time was exclusive of:  Separately billable procedures and treating other patients and teaching time   Critical care was time spent personally  by me on the following activities:  Development of treatment plan with patient or surrogate, evaluation of patient's response to treatment, examination of patient, obtaining history from patient or surrogate, review of old charts, re-evaluation of patient's condition, pulse oximetry, ordering and review of radiographic studies, ordering and review of laboratory studies and ordering and performing treatments and interventions   I assumed direction of critical care for this patient from another provider in my specialty: no   After initial E/M assessment, critical care services were subsequently performed that were exclusive of separately billable procedures or treatment.     Medications Ordered in ED Medications  albuterol (PROVENTIL) (2.5 MG/3ML) 0.083% nebulizer solution (10 mg/hr Nebulization Given 08/27/21 0329)  ipratropium (ATROVENT) nebulizer solution 1 mg (1 mg Nebulization Not Given 08/27/21 0330)    ED Course  I have reviewed the triage vital signs and the nursing notes.  Pertinent labs & imaging results that were available during my care of the patient were reviewed by me and considered in my medical decision making (see chart for details).    MDM Rules/Calculators/A&P                         Likely COPD exacerbation.  Patient is in significant respiratory distress.  Patient is  straight upright, tachypneic, prolonged expiratory phase, accessory muscle usage and JVD.  No obvious lower extremity edema to suggest that he has CHF.  Chest x-ray without any obvious findings to suggest pneumonia, heart failure or other causes.  Patient started on BiPAP on initial evaluation and has significant improvement with that.  We will go ahead and continue doing albuterol, Atrovent as needed, steroids.  Had steroids via EMS. Better on BiPaP, will admit.   Final Clinical Impression(s) / ED Diagnoses Final diagnoses:  None    Rx / DC Orders ED Discharge Orders     None        Tory Septer, Barbara Cower, MD 08/27/21 0600

## 2021-08-27 NOTE — ED Notes (Signed)
RT at bedside, patient placed on [place] 4 liters after duoneb.

## 2021-08-27 NOTE — H&P (Signed)
History and Physical    [name] XLK:[id number] DOB: Jul 12, 1946 DOA: 08/27/2021  PCP: Duanne Limerick, MD  Patient coming from: Home  I have personally briefly reviewed patient's old medical records available.   Chief Complaint: Shortness of breath and wheezing  HPI: Nathaniel Garcia is a 75 y.o. male with medical history significant of COPD not on home oxygen but on maintenance prednisone 5 mg daily, history of stroke, hyperlipidemia presented to the emergency room with 1 day of shortness of breath and wheezing.  Patient reports good symptom control at home with his nebulizers before this attack.  No aggravating factors.  Patient tells me that few hours ago he started feeling short of breath and very tight chest, used his nebulizer at home that did not help so called EMS with respiratory distress.  EMS had given him a dose of his steroids and nebulizer on the way to the ER.  No fever chills.  Denies any cough congestion or sick contacts.  Chest tightness present but denies any chest pain.  Denies any nausea vomiting abdominal pain.  Denies any leg swelling, orthopnea.  Denies any change in bowel or bladder habits. ED Course: Initially in respiratory distress.  Given heavy dose of albuterol and started on BiPAP overnight with improvement of symptoms.  Chest x-ray is essentially normal with emphysematous changes.  Electrolytes are normal. On my interview, he was taken off the BiPAP and started on 4 L of oxygen and looks fairly comfortable but still with persistent wheezing.  Review of Systems: all systems are reviewed and pertinent positive as per HPI otherwise rest are negative.    Past Medical History:  Diagnosis Date   COPD (chronic obstructive pulmonary disease) (HCC)    Hematuria    Hyperlipidemia    Stroke [facility])     Past Surgical History:  Procedure Laterality Date   COLONOSCOPY  2011   normal- MD docs    Social history   reports that he quit smoking about  10 months ago. His smoking use included cigarettes. He has a 1.20 pack-year smoking history. He has never used smokeless tobacco. He reports current alcohol use of about 2.0 standard drinks per week. He reports that he does not currently use drugs.  Allergies  Allergen Reactions   Hctz [Hydrochlorothiazide] Rash    Family History  Problem Relation Age of Onset   Prostate cancer Neg Hx      Prior to Admission medications   Medication Sig Start Date End Date Taking? Authorizing Provider  albuterol (VENTOLIN HFA) 108 (90 Base) MCG/ACT inhaler Inhale 2 puffs into the lungs every 6 (six) hours as needed for wheezing or shortness of breath. 05/31/20  Yes Duanne Limerick, MD  atorvastatin (LIPITOR) 80 MG tablet Take 1 tablet (80 mg total) by mouth daily. 07/12/21  Yes Duanne Limerick, MD  clopidogrel (PLAVIX) 75 MG tablet TAKE 1 TABLET(75 MG) BY MOUTH DAILY Patient taking differently: Take 75 mg by mouth daily. 07/12/21  Yes Duanne Limerick, MD  montelukast (SINGULAIR) 10 MG tablet Take 1 tablet (10 mg total) by mouth at bedtime. 07/12/21  Yes Duanne Limerick, MD  predniSONE (DELTASONE) 5 MG tablet Take 5 mg by mouth daily. 08/07/21  Yes [provider]  sertraline (ZOLOFT) 25 MG tablet Take 1 tablet (25 mg total) by mouth at bedtime. 07/12/21  Yes Duanne Limerick, MD  tamsulosin (FLOMAX) 0.4 MG CAPS capsule TAKE 1 CAPSULE(0.4 MG) BY MOUTH DAILY Patient taking differently: Take  0.4 mg by mouth daily. 08/12/21  Yes McGowan, Carollee Herter A, PA-C  propranolol (INDERAL) 10 MG tablet Take 1 tablet (10 mg total) by mouth every morning. 08/01/21   Duanne Limerick, MD    Physical Exam: Vitals:   08/27/21 0530 08/27/21 0600 08/27/21 0755 08/27/21 0800  BP: (!) 159/143 (!) 125/59 139/60 139/60  Pulse: 67 (!) 58 68 66  Resp: (!) 30 19 (!) 28 20  Temp:    97.9 F (36.6 C)  TempSrc:    Oral  SpO2: 100% 100% 100% 100%  Weight:      Height:        Constitutional: NAD, calm, comfortable on 4 L oxygen.   In mild distress on talking. Vitals:   08/27/21 0530 08/27/21 0600 08/27/21 0755 08/27/21 0800  BP: (!) 159/143 (!) 125/59 139/60 139/60  Pulse: 67 (!) 58 68 66  Resp: (!) 30 19 (!) 28 20  Temp:    97.9 F (36.6 C)  TempSrc:    Oral  SpO2: 100% 100% 100% 100%  Weight:      Height:       Eyes: PERRL, lids and conjunctivae normal ENMT: Mucous membranes are moist. Posterior pharynx clear of any exudate or lesions.Normal dentition.  Neck: normal, supple, no masses, no thyromegaly Respiratory: Fair bilateral air entry with expiratory wheezes all over the lung fields.  No crackles. Cardiovascular: Regular rate and rhythm, no murmurs / rubs / gallops. No extremity edema. 2+ pedal pulses. No carotid bruits.  Abdomen: no tenderness, no masses palpated. No hepatosplenomegaly. Bowel sounds positive.  Musculoskeletal: no clubbing / cyanosis. No joint deformity upper and lower extremities. Good ROM, no contractures. Normal muscle tone.  Skin: no rashes, lesions, ulcers. No induration Neurologic: CN 2-12 grossly intact. Sensation intact, DTR normal. Strength 5/5 in all 4.  Psychiatric: Normal judgment and insight. Alert and oriented x 3. Normal mood.     Labs on Admission: I have personally reviewed following labs and imaging studies  CBC: Recent Labs  Lab 08/27/21 0230 08/27/21 0539  WBC 10.5  --   NEUTROABS 6.9  --   HGB 14.0 12.9*  HCT 43.1 38.0*  MCV 105.1*  --   PLT 182  --    Basic Metabolic Panel: Recent Labs  Lab 08/27/21 0230 08/27/21 0539  NA 139 138  K 4.3 4.1  CL 108  --   CO2 24  --   GLUCOSE 113*  --   BUN 14  --   CREATININE 0.97  --   CALCIUM 8.7*  --    GFR: Estimated Creatinine Clearance: 55.7 mL/min (by C-G formula based on SCr of 0.97 mg/dL). Liver Function Tests: Recent Labs  Lab 08/27/21 0230  AST 16  ALT 16  ALKPHOS 47  BILITOT 0.2*  PROT 6.1*  ALBUMIN 3.4*   No results for input(s): LIPASE, AMYLASE in the last 168 hours. No results for  input(s): AMMONIA in the last 168 hours. Coagulation Profile: No results for input(s): INR, PROTIME in the last 168 hours. Cardiac Enzymes: No results for input(s): CKTOTAL, CKMB, CKMBINDEX, TROPONINI in the last 168 hours. BNP (last 3 results) No results for input(s): PROBNP in the last 8760 hours. HbA1C: No results for input(s): HGBA1C in the last 72 hours. CBG: No results for input(s): GLUCAP in the last 168 hours. Lipid Profile: No results for input(s): CHOL, HDL, LDLCALC, TRIG, CHOLHDL, LDLDIRECT in the last 72 hours. Thyroid Function Tests: No results for input(s): TSH, T4TOTAL, FREET4, T3FREE,  THYROIDAB in the last 72 hours. Anemia Panel: No results for input(s): VITAMINB12, FOLATE, FERRITIN, TIBC, IRON, RETICCTPCT in the last 72 hours. Urine analysis:    Component Value Date/Time   COLORURINE YELLOW 08/12/2021 0826   APPEARANCEUR CLEAR 08/12/2021 0826   APPEARANCEUR Clear 07/28/2019 1558   LABSPEC 1.025 08/12/2021 0826   PHURINE 5.5 08/12/2021 0826   GLUCOSEU NEGATIVE 08/12/2021 0826   HGBUR TRACE (A) 08/12/2021 0826   BILIRUBINUR NEGATIVE 08/12/2021 0826   BILIRUBINUR Negative 07/28/2019 1558   KETONESUR TRACE (A) 08/12/2021 0826   PROTEINUR NEGATIVE 08/12/2021 0826   UROBILINOGEN 0.2 09/15/2016 1008   NITRITE NEGATIVE 08/12/2021 0826   LEUKOCYTESUR NEGATIVE 08/12/2021 0826    Radiological Exams on Admission: DG Chest Portable 1 View  Result Date: 08/27/2021 CLINICAL DATA:  Dyspnea.  COPD, emphysema. EXAM: PORTABLE CHEST 1 VIEW COMPARISON:  08/05/2021 FINDINGS: The lungs are symmetrically hyperinflated in keeping with changes of underlying COPD, similar to prior examination. No superimposed focal pulmonary infiltrate. Benign calcified granuloma within the left mid lung zone. No pneumothorax or pleural effusion. Cardiac size within normal limits. Pulmonary vascularity is normal. No acute bone abnormality. IMPRESSION: No active disease.  COPD. Electronically Signed    By: Helyn Numbers M.D.   On: 08/27/2021 03:27    EKG: Independently reviewed.  Sinus tachycardia.  No acute changes.  Assessment/Plan Principal Problem:   COPD exacerbation (HCC)     1.  COPD with acute exacerbation: Acute hypoxemic respiratory failure due to COPD exacerbation. Agree with admission to monitored unit because of severity of symptoms. Aggressive bronchodilator therapy, IV steroids, inhalational steroids, scheduled and as needed bronchodilators, deep breathing exercises, incentive spirometry, chest physiotherapy and respiratory therapy consult. Supplemental oxygen to keep saturations more than 92%. Start mobilizing with PT OT.  2.  History of stroke: Stable.  No new neurological deficit.  He is on Plavix and a statin.  3.  BPH: On Flomax.  Continued.   DVT prophylaxis: Lovenox subcu Code Status: Full code Family Communication: None Disposition Plan: Home Consults called: None Admission status: Medical telemetry.   Dorcas Carrow MD [facility] Pager [phone]

## 2021-08-27 NOTE — ED Notes (Signed)
RT at bedside, remains on BIPAP, A/Ox4, denies discomfort. Relaxed.

## 2021-08-27 NOTE — Progress Notes (Signed)
Patient was taken off BIPAP and placed on a 4L [place]. Patient is tolerating [place] well at this time & denies any shortness of breath. BIPAP is in the patient's room on standby. RN aware.

## 2021-08-27 NOTE — Plan of Care (Signed)
  Problem: Health Behavior/Discharge Planning: Goal: Ability to manage health-related needs will improve Outcome: Progressing   

## 2021-08-27 NOTE — ED Triage Notes (Signed)
Patient presents via [facility] with [facility] x2 hours. Pt tried home albuterol nebs and inhalers with no relief; h/x COPD and emphysema

## 2021-08-27 NOTE — Progress Notes (Signed)
Patient was placed back on BIPAP due to shortness of breath at rest. Patient is tolerating BIPAP well at this time.

## 2021-08-28 DIAGNOSIS — J441 Chronic obstructive pulmonary disease with (acute) exacerbation: Secondary | ICD-10-CM | POA: Diagnosis not present

## 2021-08-28 LAB — COMPREHENSIVE METABOLIC PANEL
ALT: 17 U/L (ref 0–44)
AST: 15 U/L (ref 15–41)
Albumin: 2.9 g/dL — ABNORMAL LOW (ref 3.5–5.0)
Alkaline Phosphatase: 45 U/L (ref 38–126)
Anion gap: 6 (ref 5–15)
BUN: 15 mg/dL (ref 8–23)
CO2: 27 mmol/L (ref 22–32)
Calcium: 8.8 mg/dL — ABNORMAL LOW (ref 8.9–10.3)
Chloride: 103 mmol/L (ref 98–111)
Creatinine, Ser: 1.02 mg/dL (ref 0.61–1.24)
GFR, Estimated: >60 mL/min (ref >60)
Glucose, Bld: 140 mg/dL — ABNORMAL HIGH (ref 70–99)
Potassium: 4.7 mmol/L (ref 3.5–5.1)
Sodium: 136 mmol/L (ref 135–145)
Total Bilirubin: 0.6 mg/dL (ref 0.3–1.2)
Total Protein: 5.8 g/dL — ABNORMAL LOW (ref 6.5–8.1)

## 2021-08-28 LAB — CBC WITH DIFFERENTIAL/PLATELET
Abs Immature Granulocytes: 0.04 10*3/uL (ref 0.00–0.07)
Basophils Absolute: 0 10*3/uL (ref 0.0–0.1)
Basophils Relative: 0 %
Eosinophils Absolute: 0 10*3/uL (ref 0.0–0.5)
Eosinophils Relative: 0 %
HCT: 37.8 % — ABNORMAL LOW (ref 39.0–52.0)
Hemoglobin: 12.2 g/dL — ABNORMAL LOW (ref 13.0–17.0)
Immature Granulocytes: 0 %
Lymphocytes Relative: 7 %
Lymphs Abs: 0.7 10*3/uL (ref 0.7–4.0)
MCH: 33.3 pg (ref 26.0–34.0)
MCHC: 32.3 g/dL (ref 30.0–36.0)
MCV: 103.3 fL — ABNORMAL HIGH (ref 80.0–100.0)
Monocytes Absolute: 0.3 10*3/uL (ref 0.1–1.0)
Monocytes Relative: 3 %
Neutro Abs: 9 10*3/uL — ABNORMAL HIGH (ref 1.7–7.7)
Neutrophils Relative %: 90 %
Platelets: 188 10*3/uL (ref 150–400)
RBC: 3.66 MIL/uL — ABNORMAL LOW (ref 4.22–5.81)
RDW: 13.2 % (ref 11.5–15.5)
WBC: 10.1 10*3/uL (ref 4.0–10.5)
nRBC: 0 % (ref 0.0–0.2)

## 2021-08-28 LAB — MAGNESIUM: Magnesium: 2 mg/dL (ref 1.7–2.4)

## 2021-08-28 MED ORDER — LORAZEPAM 2 MG/ML IJ SOLN
1.0000 mg | Freq: Once | INTRAMUSCULAR | Status: AC
  Administered 2021-08-28: 1 mg via INTRAVENOUS
  Filled 2021-08-28: qty 1

## 2021-08-28 MED ORDER — HALOPERIDOL 1 MG PO TABS: 5.0000 mg | ORAL_TABLET | Freq: Once | ORAL | Status: AC

## 2021-08-28 MED ORDER — HALOPERIDOL LACTATE 5 MG/ML IJ SOLN
5.0000 mg | Freq: Once | INTRAMUSCULAR | Status: AC
  Administered 2021-08-28: 5 mg via INTRAMUSCULAR
  Filled 2021-08-28: qty 1

## 2021-08-28 MED ORDER — NICOTINE 21 MG/24HR TD PT24
21.0000 mg | MEDICATED_PATCH | Freq: Every day | TRANSDERMAL | Status: DC
  Administered 2021-08-30 – 2021-09-01 (×3): 21 mg via TRANSDERMAL
  Filled 2021-08-28 (×6): qty 1

## 2021-08-28 MED ORDER — IPRATROPIUM-ALBUTEROL 0.5-2.5 (3) MG/3ML IN SOLN
3.0000 mL | Freq: Four times a day (QID) | RESPIRATORY_TRACT | Status: DC
  Administered 2021-08-28: 3 mL via RESPIRATORY_TRACT
  Filled 2021-08-28: qty 3

## 2021-08-28 MED ORDER — METHYLPREDNISOLONE SODIUM SUCC 125 MG IJ SOLR
120.0000 mg | Freq: Two times a day (BID) | INTRAMUSCULAR | Status: DC
  Administered 2021-08-28 – 2021-08-29 (×2): 120 mg via INTRAVENOUS
  Filled 2021-08-28 (×2): qty 2

## 2021-08-28 NOTE — Progress Notes (Addendum)
Pt had another fall found on the floor beside bed, all safety measures were in place. MD paged- will follow up with meds and monitor.

## 2021-08-28 NOTE — Progress Notes (Signed)
Pt was found down on knees in front of bsc, helped to chair and returned to bed. Confusion present and baseline. MD notified, VSS and no new injury apparent. No new order from MD. Will continue to monitor.

## 2021-08-28 NOTE — Progress Notes (Signed)
[facility] notified via chat that patient is agitated and making multiple attempts to get OOB and removing his BiPAP he has been given 0.5 mg IV ativan / 650 Tylenol and 50 mg of Trazodone with no effect

## 2021-08-28 NOTE — Progress Notes (Signed)
Patient daughter Ms. Suzie Portela called for an update on her father I explained that family should get together to develop a password to share information because of HIPPA and patient is asleep now and not able to confirm identity and authorize sharing of information. She verbalized understanding and her number was added in front of chart. MD please call with update Nathaniel Garcia [phone].

## 2021-08-28 NOTE — Progress Notes (Signed)
[facility]  PROGRESS NOTE  Nathaniel Garcia HGD:[id number] DOB: [birth year] DOA: 08/27/2021 PCP: Duanne Limerick, MD   Brief HPI:   75 year old male with history of COPD not on home oxygen, on chronic prednisone therapy 5 mg daily, history of stroke, hyperlipidemia came to ED with complaints of shortness of breath and wheezing.  In the ED patient received Solu-Medrol and albuterol and started on BiPAP overnight with improvement of symptoms.  Chest x-ray was normal with emphysematous changes.    Subjective   Patient seen and examined, still has bilateral wheezing.   Assessment/Plan:     COPD exacerbation -We will change Solu-Medrol to 60 mg IV every 6 hours -Continue DuoNebs every 6 hours -Continue oxygen, keep O2 sats more than 92%  History of stroke -Continue Plavix, statin  BPH -Continue Flomax  Delirium -Patient became confused after he received IV Solu-Medrol -Given 1 dose of Haldol 5 mg IM x1 -Continue one-to-one sitter for safety       Scheduled medications:    atorvastatin  80 mg Oral Daily   clopidogrel  75 mg Oral Daily   enoxaparin (LOVENOX) injection  40 mg Subcutaneous Q24H   ipratropium-albuterol  3 mL Nebulization Q6H   methylPREDNISolone (SOLU-MEDROL) injection  120 mg Intravenous Q12H   montelukast  10 mg Oral QHS   nicotine  21 mg Transdermal Daily   propranolol  10 mg Oral q morning   sertraline  25 mg Oral QHS   tamsulosin  0.4 mg Oral Daily     Data Reviewed:   CBG:  No results for input(s): GLUCAP in the last 168 hours.  SpO2: 100 % O2 Flow Rate (L/min): 3 L/min FiO2 (%): 40 %    Vitals:   08/28/21 1243 08/28/21 1500 08/28/21 1700 08/28/21 1800  BP:  131/84    Pulse: (!) 56 74    Resp: 17 (!) 38 (!) 32 (!) 30  Temp:      TempSrc:      SpO2: 98% 100%    Weight:      Height:         Intake/Output Summary (Last 24 hours) at 08/28/2021 1921 Last data filed at 08/28/2021 0600 Gross per 24 hour  Intake 523.48  ml  Output 950 ml  Net -426.52 ml    10/11 0701 - 10/12 1900 In: 523.5 [I.V.:523.5] Out: 950 [Urine:950]  Filed Weights   08/27/21 0235  Weight: 60.8 kg    Data Reviewed: Basic Metabolic Panel: Recent Labs  Lab 08/27/21 0230 08/27/21 0539 08/28/21 0240  NA 139 138 136  K 4.3 4.1 4.7  CL 108  --  103  CO2 24  --  27  GLUCOSE 113*  --  140*  BUN 14  --  15  CREATININE 0.97  --  1.02  CALCIUM 8.7*  --  8.8*  MG  --   --  2.0   Liver Function Tests: Recent Labs  Lab 08/27/21 0230 08/28/21 0240  AST 16 15  ALT 16 17  ALKPHOS 47 45  BILITOT 0.2* 0.6  PROT 6.1* 5.8*  ALBUMIN 3.4* 2.9*   No results for input(s): LIPASE, AMYLASE in the last 168 hours. No results for input(s): AMMONIA in the last 168 hours. CBC: Recent Labs  Lab 08/27/21 0230 08/27/21 0539 08/28/21 0240  WBC 10.5  --  10.1  NEUTROABS 6.9  --  9.0*  HGB 14.0 12.9* 12.2*  HCT 43.1 38.0* 37.8*  MCV 105.1*  --  103.3*  PLT 182  --  188   Cardiac Enzymes: No results for input(s): CKTOTAL, CKMB, CKMBINDEX, TROPONINI in the last 168 hours. BNP (last 3 results) Recent Labs    10/29/20 2052 08/27/21 0230  BNP 84.2 38.8    ProBNP (last 3 results) No results for input(s): PROBNP in the last 8760 hours.  CBG: No results for input(s): GLUCAP in the last 168 hours.     Radiology Reports  DG Chest Portable 1 View  Result Date: 08/27/2021 CLINICAL DATA:  Dyspnea.  COPD, emphysema. EXAM: PORTABLE CHEST 1 VIEW COMPARISON:  08/05/2021 FINDINGS: The lungs are symmetrically hyperinflated in keeping with changes of underlying COPD, similar to prior examination. No superimposed focal pulmonary infiltrate. Benign calcified granuloma within the left mid lung zone. No pneumothorax or pleural effusion. Cardiac size within normal limits. Pulmonary vascularity is normal. No acute bone abnormality. IMPRESSION: No active disease.  COPD. Electronically Signed   By: Helyn Numbers M.D.   On: 08/27/2021 03:27        Antibiotics: Anti-infectives (From admission, onward)    None         DVT prophylaxis: Lovenox  Code Status: Full code  Family Communication: No family at bedside   Consultants:   Procedures:     Objective    Physical Examination:   General-appears in no acute distress Heart-S1-S2, regular, no murmur auscultated Lungs-bilateral wheezing auscultated Abdomen-soft, nontender, no organomegaly Extremities-no edema in the lower extremities Neuro-alert, oriented x3, no focal deficit noted  Status is: Inpatient  Dispo: The patient is from: Home              Anticipated d/c is to: Home              Anticipated d/c date is: 08/31/2021              Patient currently not stable for discharge  Barrier to discharge-ongoing COPD exacerbation  COVID-19 Labs  Recent Labs    08/27/21 2203  DDIMER 0.37    Lab Results  Component Value Date   SARSCOV2NAA NEGATIVE 08/27/2021   SARSCOV2NAA NEGATIVE 10/29/2020   SARSCOV2NAA Not Detected 10/02/2020            Recent Results (from the past 240 hour(s))  Resp Panel by RT-PCR (Flu A&B, Covid) Nasopharyngeal Swab     Status: None   Collection Time: 08/27/21  3:03 AM   Specimen: Nasopharyngeal Swab; Nasopharyngeal(NP) swabs in vial transport medium  Result Value Ref Range Status   SARS Coronavirus 2 by RT PCR NEGATIVE NEGATIVE Final    Comment: (NOTE) SARS-CoV-2 target nucleic acids are NOT DETECTED.  The SARS-CoV-2 RNA is generally detectable in upper respiratory specimens during the acute phase of infection. The lowest concentration of SARS-CoV-2 viral copies this assay can detect is 138 copies/mL. A negative result does not preclude SARS-Cov-2 infection and should not be used as the sole basis for treatment or other patient management decisions. A negative result may occur with  improper specimen collection/handling, submission of specimen other than nasopharyngeal swab, presence of viral  mutation(s) within the areas targeted by this assay, and inadequate number of viral copies(<138 copies/mL). A negative result must be combined with clinical observations, patient history, and epidemiological information. The expected result is Negative.  Fact Sheet for Patients:  BloggerCourse.com  Fact Sheet for Healthcare Providers:  SeriousBroker.it  This test is no t yet approved or cleared by the [place] FDA and  has been authorized for detection and/or diagnosis of SARS-CoV-2  by FDA under an Emergency Use Authorization (EUA). This EUA will remain  in effect (meaning this test can be used) for the duration of the COVID-19 declaration under Section 564(b)(1) of the Act, 21 U.S.C.section 360bbb-3(b)(1), unless the authorization is terminated  or revoked sooner.       Influenza A by PCR NEGATIVE NEGATIVE Final   Influenza B by PCR NEGATIVE NEGATIVE Final    Comment: (NOTE) The Xpert Xpress SARS-CoV-2/FLU/RSV plus assay is intended as an aid in the diagnosis of influenza from Nasopharyngeal swab specimens and should not be used as a sole basis for treatment. Nasal washings and aspirates are unacceptable for Xpert Xpress SARS-CoV-2/FLU/RSV testing.  Fact Sheet for Patients: BloggerCourse.com  Fact Sheet for Healthcare Providers: SeriousBroker.it  This test is not yet approved or cleared by the [place] FDA and has been authorized for detection and/or diagnosis of SARS-CoV-2 by FDA under an Emergency Use Authorization (EUA). This EUA will remain in effect (meaning this test can be used) for the duration of the COVID-19 declaration under Section 564(b)(1) of the Act, 21 U.S.C. section 360bbb-3(b)(1), unless the authorization is terminated or revoked.  Performed at [facility], [address], [place], [place] [address]     Meredeth Ide   [facility] If 7PM-7AM, please contact night-coverage at [email or web address], Office  [phone]   08/28/2021, 7:21 PM  LOS: 1 day

## 2021-08-28 NOTE — Progress Notes (Signed)
   08/28/21 2314  BiPAP/CPAP/SIPAP  BiPAP/CPAP/SIPAP Pt Type Adult  Mask Type Full face mask  Mask Size Medium  Set Rate 12 breaths/min  Respiratory Rate 20 breaths/min  IPAP 12 cmH20  EPAP 6 cmH2O  Oxygen Percent 40 %  Minute Ventilation 7.5  Leak 1  Peak Inspiratory Pressure (PIP) 12  Tidal Volume (Vt) 375  BiPAP/CPAP/SIPAP BiPAP  Patient Home Equipment No  Auto Titrate No  Press High Alarm 25 cmH2O  Press Low Alarm 5 cmH2O  BiPAP/CPAP /SiPAP Vitals  Pulse Rate 65  Resp 18  SpO2 100 %  MEWS Score/Color  MEWS Score 0  MEWS Score Color Green  Placed pt. On bipap due to altered mental status

## 2021-08-29 DIAGNOSIS — J441 Chronic obstructive pulmonary disease with (acute) exacerbation: Secondary | ICD-10-CM | POA: Diagnosis not present

## 2021-08-29 LAB — BLOOD GAS, ARTERIAL
Acid-Base Excess: 2 mmol/L (ref 0.0–2.0)
Bicarbonate: 26.7 mmol/L (ref 20.0–28.0)
FIO2: 40
O2 Saturation: 99.4 %
Patient temperature: 36.5
pCO2 arterial: 45.2 mmHg (ref 32.0–48.0)
pH, Arterial: 7.386 (ref 7.350–7.450)
pO2, Arterial: 161 mmHg — ABNORMAL HIGH (ref 83.0–108.0)

## 2021-08-29 LAB — CBC
HCT: 41.1 % (ref 39.0–52.0)
Hemoglobin: 13.7 g/dL (ref 13.0–17.0)
MCH: 34.3 pg — ABNORMAL HIGH (ref 26.0–34.0)
MCHC: 33.3 g/dL (ref 30.0–36.0)
MCV: 102.8 fL — ABNORMAL HIGH (ref 80.0–100.0)
Platelets: 195 10*3/uL (ref 150–400)
RBC: 4 MIL/uL — ABNORMAL LOW (ref 4.22–5.81)
RDW: 13.2 % (ref 11.5–15.5)
WBC: 12.4 10*3/uL — ABNORMAL HIGH (ref 4.0–10.5)
nRBC: 0 % (ref 0.0–0.2)

## 2021-08-29 LAB — BASIC METABOLIC PANEL
Anion gap: 12 (ref 5–15)
BUN: 20 mg/dL (ref 8–23)
CO2: 21 mmol/L — ABNORMAL LOW (ref 22–32)
Calcium: 8.9 mg/dL (ref 8.9–10.3)
Chloride: 102 mmol/L (ref 98–111)
Creatinine, Ser: 1.17 mg/dL (ref 0.61–1.24)
GFR, Estimated: >60 mL/min (ref >60)
Glucose, Bld: 116 mg/dL — ABNORMAL HIGH (ref 70–99)
Potassium: 4.9 mmol/L (ref 3.5–5.1)
Sodium: 135 mmol/L (ref 135–145)

## 2021-08-29 MED ORDER — HALOPERIDOL LACTATE 5 MG/ML IJ SOLN
5.0000 mg | Freq: Once | INTRAMUSCULAR | Status: AC | PRN
  Administered 2021-08-29: 5 mg via INTRAVENOUS
  Filled 2021-08-29: qty 1

## 2021-08-29 MED ORDER — METHYLPREDNISOLONE SODIUM SUCC 125 MG IJ SOLR
120.0000 mg | INTRAMUSCULAR | Status: DC
  Administered 2021-08-30 – 2021-08-31 (×2): 120 mg via INTRAVENOUS
  Filled 2021-08-29 (×2): qty 2

## 2021-08-29 MED ORDER — IPRATROPIUM-ALBUTEROL 0.5-2.5 (3) MG/3ML IN SOLN
3.0000 mL | Freq: Three times a day (TID) | RESPIRATORY_TRACT | Status: DC
  Administered 2021-08-29 – 2021-08-31 (×8): 3 mL via RESPIRATORY_TRACT
  Filled 2021-08-29 (×10): qty 3

## 2021-08-29 NOTE — Progress Notes (Signed)
[facility]  PROGRESS NOTE  Nathaniel Garcia IWL:[id number] DOB: 30-May-1946 DOA: 08/27/2021 PCP: Duanne Limerick, MD   Brief HPI:   75 year old male with history of COPD not on home oxygen, on chronic prednisone therapy 5 mg daily, history of stroke, hyperlipidemia came to ED with complaints of shortness of breath and wheezing.  In the ED patient received Solu-Medrol and albuterol and started on BiPAP overnight with improvement of symptoms.  Chest x-ray was normal with emphysematous changes.    Subjective   Patient seen and examined, became confused yesterday after starting IV Solu-Medrol.  Was given Haldol for agitation.  Required BiPAP last night.  This morning patient is on BiPAP, somnolent.   Assessment/Plan:     COPD exacerbation -Significantly improved after starting IV Solu-Medrol -We will cut down the dose of Solu-Medrol to 60 mg IV every 12 hours due to worsening agitation -Continue DuoNebs every 6 hours -Continue oxygen, keep O2 sats more than 92%  History of stroke -Continue Plavix, statin  BPH -Continue Flomax  Delirium -Patient became confused after starting IV Solu-Medrol -Required Haldol 5 mg x 2 doses yesterday -We will cut down the dose of Solu-Medrol to 60 mg IV every 12 hours as above -Environmental education officer at bedside       Scheduled medications:    atorvastatin  80 mg Oral Daily   clopidogrel  75 mg Oral Daily   enoxaparin (LOVENOX) injection  40 mg Subcutaneous Q24H   ipratropium-albuterol  3 mL Nebulization TID   methylPREDNISolone (SOLU-MEDROL) injection  120 mg Intravenous Q12H   montelukast  10 mg Oral QHS   nicotine  21 mg Transdermal Daily   propranolol  10 mg Oral q morning   sertraline  25 mg Oral QHS   tamsulosin  0.4 mg Oral Daily     Data Reviewed:   CBG:  No results for input(s): GLUCAP in the last 168 hours.  SpO2: 100 % O2 Flow Rate (L/min): 3 L/min FiO2 (%): 40 %    Vitals:   08/29/21 0741 08/29/21  0750 08/29/21 1210 08/29/21 1343  BP:      Pulse: (!) 51  (!) 54   Resp: 17  17   Temp:      TempSrc:      SpO2: 100% 100% 100% 100%  Weight:      Height:         Intake/Output Summary (Last 24 hours) at 08/29/2021 1502 Last data filed at 08/29/2021 1200 Gross per 24 hour  Intake 0 ml  Output 200 ml  Net -200 ml    10/11 1901 - 10/13 0700 In: 523.5 [I.V.:523.5] Out: 1150 [Urine:1150]  Filed Weights   08/27/21 0235  Weight: 60.8 kg    Data Reviewed: Basic Metabolic Panel: Recent Labs  Lab 08/27/21 0230 08/27/21 0539 08/28/21 0240 08/29/21 0244  NA 139 138 136 135  K 4.3 4.1 4.7 4.9  CL 108  --  103 102  CO2 24  --  27 21*  GLUCOSE 113*  --  140* 116*  BUN 14  --  15 20  CREATININE 0.97  --  1.02 1.17  CALCIUM 8.7*  --  8.8* 8.9  MG  --   --  2.0  --    Liver Function Tests: Recent Labs  Lab 08/27/21 0230 08/28/21 0240  AST 16 15  ALT 16 17  ALKPHOS 47 45  BILITOT 0.2* 0.6  PROT 6.1* 5.8*  ALBUMIN 3.4* 2.9*   No  results for input(s): LIPASE, AMYLASE in the last 168 hours. No results for input(s): AMMONIA in the last 168 hours. CBC: Recent Labs  Lab 08/27/21 0230 08/27/21 0539 08/28/21 0240 08/29/21 0244  WBC 10.5  --  10.1 12.4*  NEUTROABS 6.9  --  9.0*  --   HGB 14.0 12.9* 12.2* 13.7  HCT 43.1 38.0* 37.8* 41.1  MCV 105.1*  --  103.3* 102.8*  PLT 182  --  188 195   Cardiac Enzymes: No results for input(s): CKTOTAL, CKMB, CKMBINDEX, TROPONINI in the last 168 hours. BNP (last 3 results) Recent Labs    10/29/20 2052 08/27/21 0230  BNP 84.2 38.8    ProBNP (last 3 results) No results for input(s): PROBNP in the last 8760 hours.  CBG: No results for input(s): GLUCAP in the last 168 hours.     Radiology Reports  No results found.     Antibiotics: Anti-infectives (From admission, onward)    None         DVT prophylaxis: Lovenox  Code Status: Full code  Family Communication: No family at  bedside   Consultants:   Procedures:     Objective    Physical Examination:   General-somnolent on BiPAP Heart-S1-S2, regular, no murmur auscultated Lungs-clear to auscultation bilaterally, no wheezing or crackles auscultated Abdomen-soft, nontender, no organomegaly Extremities-no edema in the lower extremities Neuro-somnolent , not oriented x3   Status is: Inpatient  Dispo: The patient is from: Home              Anticipated d/c is to: Home              Anticipated d/c date is: 08/31/2021              Patient currently not stable for discharge  Barrier to discharge-ongoing COPD exacerbation  COVID-19 Labs  Recent Labs    08/27/21 2203  DDIMER 0.37    Lab Results  Component Value Date   SARSCOV2NAA NEGATIVE 08/27/2021   SARSCOV2NAA NEGATIVE 10/29/2020   SARSCOV2NAA Not Detected 10/02/2020            Recent Results (from the past 240 hour(s))  Resp Panel by RT-PCR (Flu A&B, Covid) Nasopharyngeal Swab     Status: None   Collection Time: 08/27/21  3:03 AM   Specimen: Nasopharyngeal Swab; Nasopharyngeal(NP) swabs in vial transport medium  Result Value Ref Range Status   SARS Coronavirus 2 by RT PCR NEGATIVE NEGATIVE Final    Comment: (NOTE) SARS-CoV-2 target nucleic acids are NOT DETECTED.  The SARS-CoV-2 RNA is generally detectable in upper respiratory specimens during the acute phase of infection. The lowest concentration of SARS-CoV-2 viral copies this assay can detect is 138 copies/mL. A negative result does not preclude SARS-Cov-2 infection and should not be used as the sole basis for treatment or other patient management decisions. A negative result may occur with  improper specimen collection/handling, submission of specimen other than nasopharyngeal swab, presence of viral mutation(s) within the areas targeted by this assay, and inadequate number of viral copies(<138 copies/mL). A negative result must be combined with clinical observations,  patient history, and epidemiological information. The expected result is Negative.  Fact Sheet for Patients:  BloggerCourse.com  Fact Sheet for Healthcare Providers:  SeriousBroker.it  This test is no t yet approved or cleared by the [place] FDA and  has been authorized for detection and/or diagnosis of SARS-CoV-2 by FDA under an Emergency Use Authorization (EUA). This EUA will remain  in effect (meaning  this test can be used) for the duration of the COVID-19 declaration under Section 564(b)(1) of the Act, 21 U.S.C.section 360bbb-3(b)(1), unless the authorization is terminated  or revoked sooner.       Influenza A by PCR NEGATIVE NEGATIVE Final   Influenza B by PCR NEGATIVE NEGATIVE Final    Comment: (NOTE) The Xpert Xpress SARS-CoV-2/FLU/RSV plus assay is intended as an aid in the diagnosis of influenza from Nasopharyngeal swab specimens and should not be used as a sole basis for treatment. Nasal washings and aspirates are unacceptable for Xpert Xpress SARS-CoV-2/FLU/RSV testing.  Fact Sheet for Patients: BloggerCourse.com  Fact Sheet for Healthcare Providers: SeriousBroker.it  This test is not yet approved or cleared by the [place] FDA and has been authorized for detection and/or diagnosis of SARS-CoV-2 by FDA under an Emergency Use Authorization (EUA). This EUA will remain in effect (meaning this test can be used) for the duration of the COVID-19 declaration under Section 564(b)(1) of the Act, 21 U.S.C. section 360bbb-3(b)(1), unless the authorization is terminated or revoked.  Performed at [facility], [address] [place], [place] [address]     Meredeth Ide   [facility] If 7PM-7AM, please contact night-coverage at [email or web address], Office  [phone]   08/29/2021, 3:02 PM  LOS: 2 days

## 2021-08-29 NOTE — Plan of Care (Signed)
  Problem: Health Behavior/Discharge Planning: Goal: Ability to manage health-related needs will improve Outcome: Progressing   

## 2021-08-30 DIAGNOSIS — J441 Chronic obstructive pulmonary disease with (acute) exacerbation: Secondary | ICD-10-CM | POA: Diagnosis not present

## 2021-08-30 LAB — BASIC METABOLIC PANEL
Anion gap: 7 (ref 5–15)
BUN: 22 mg/dL (ref 8–23)
CO2: 28 mmol/L (ref 22–32)
Calcium: 8.7 mg/dL — ABNORMAL LOW (ref 8.9–10.3)
Chloride: 102 mmol/L (ref 98–111)
Creatinine, Ser: 1.12 mg/dL (ref 0.61–1.24)
GFR, Estimated: >60 mL/min (ref >60)
Glucose, Bld: 85 mg/dL (ref 70–99)
Potassium: 4 mmol/L (ref 3.5–5.1)
Sodium: 137 mmol/L (ref 135–145)

## 2021-08-30 LAB — CBC
HCT: 38.5 % — ABNORMAL LOW (ref 39.0–52.0)
Hemoglobin: 12.8 g/dL — ABNORMAL LOW (ref 13.0–17.0)
MCH: 34.1 pg — ABNORMAL HIGH (ref 26.0–34.0)
MCHC: 33.2 g/dL (ref 30.0–36.0)
MCV: 102.7 fL — ABNORMAL HIGH (ref 80.0–100.0)
Platelets: 163 10*3/uL (ref 150–400)
RBC: 3.75 MIL/uL — ABNORMAL LOW (ref 4.22–5.81)
RDW: 13.2 % (ref 11.5–15.5)
WBC: 8.5 10*3/uL (ref 4.0–10.5)
nRBC: 0 % (ref 0.0–0.2)

## 2021-08-30 NOTE — Progress Notes (Signed)
Pt removed from BIPAP and placed on 3L [place]. Pt is tolerating well at this time.  

## 2021-08-30 NOTE — Evaluation (Signed)
Physical Therapy Evaluation Patient Details Name: Nathaniel Garcia MRN: [id number] DOB: 26-Aug-1946 Today's Date: 08/30/2021  History of Present Illness  75 yo admitted 10/11 with SOB and COPD exacerbation with AMS after SoluMedrol. PMHx: COPD, CVA, HLD  Clinical Impression  PT with flat affect and noted wheezing throughout session with SPO2 91% on RA. Pt with decreased strength, balance and transfers with assist to rise and for gait with pt only able to walk 80' limited by fatigue. Pt lives alone in a 2 story home with bed/bath upstairs and reports brother may be able to offer some assist at home. Pt with impaired cognition, safety, gait and transfers with flight of stairs in home who needs to achieve supervision level to be able to return home and until that time recommend ST-SNF. Pt also with incontinent stool with assist for pericare and linen change.        Recommendations for follow up therapy are one component of a multi-disciplinary discharge planning process, led by the attending physician.  Recommendations may be updated based on patient status, additional functional criteria and insurance authorization.  Follow Up Recommendations SNF;Supervision for mobility/OOB    Equipment Recommendations  Rolling walker with 5" wheels;3in1 (PT)    Recommendations for Other Services       Precautions / Restrictions Precautions Precautions: Fall Restrictions Weight Bearing Restrictions: No      Mobility  Bed Mobility Overal bed mobility: Needs Assistance Bed Mobility: Supine to Sit     Supine to sit: Min guard;HOB elevated     General bed mobility comments: HOB 15 degrees with increased time and cues to initiate transfer    Transfers Overall transfer level: Needs assistance   Transfers: Sit to/from Stand Sit to Stand: Min guard         General transfer comment: cues for hand placement with use of rail at toilet, 3 attempts prior to able to rise from  bed  Ambulation/Gait Ambulation/Gait assistance: Min assist Gait Distance (Feet): 80 Feet Assistive device: Rolling walker (2 wheeled) Gait Pattern/deviations: Step-through pattern;Decreased stride length;Trunk flexed   Gait velocity interpretation: <1.8 ft/sec, indicate of risk for recurrent falls General Gait Details: pt with flexed trunk, self too posterior to RW with assist to control RW and mod cues for balance and position in RW  Stairs            Wheelchair Mobility    Modified Rankin (Stroke Patients Only)       Balance Overall balance assessment: Needs assistance   Sitting balance-Leahy Scale: Fair Sitting balance - Comments: toilet and EOB without assist   Standing balance support: Bilateral upper extremity supported Standing balance-Leahy Scale: Poor Standing balance comment: bil UE support on RW in standing                             Pertinent Vitals/Pain Pain Assessment: No/denies pain    Home Living Family/patient expects to be discharged to:: Private residence Living Arrangements: Alone Available Help at Discharge: Available PRN/intermittently Type of Home: Apartment Home Access: Level entry     Home Layout: Two level;Bed/bath upstairs Home Equipment: None      Prior Function Level of Independence: Independent         Comments: 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[employer]; drives     Hand Dominance        Extremity/Trunk Assessment   Upper Extremity Assessment Upper Extremity Assessment: Generalized weakness    Lower Extremity Assessment Lower Extremity  Assessment: Generalized weakness    Cervical / Trunk Assessment Cervical / Trunk Assessment: Kyphotic  Communication   Communication: No difficulties  Cognition Arousal/Alertness: Awake/alert Behavior During Therapy: Flat affect Overall Cognitive Status: Impaired/Different from baseline Area of Impairment: Safety/judgement                         Safety/Judgement: Decreased  awareness of deficits;Decreased awareness of safety     General Comments: pt with decreased awareness of deficits. Unable to don socks, incontinent stool, significant struggle with transfers and gait      General Comments      Exercises     Assessment/Plan    PT Assessment Patient needs continued PT services  PT Problem List Decreased strength;Decreased mobility;Decreased safety awareness;Decreased activity tolerance;Decreased cognition;Decreased balance;Decreased knowledge of use of DME       PT Treatment Interventions Gait training;Therapeutic exercise;Patient/family education;Stair training;Functional mobility training;Balance training;DME instruction;Therapeutic activities;Neuromuscular re-education;Cognitive remediation    PT Goals (Current goals can be found in the Care Plan section)  Acute Rehab PT Goals Patient Stated Goal: return home PT Goal Formulation: With patient Time For Goal Achievement: 09/13/21 Potential to Achieve Goals: Fair    Frequency Min 3X/week   Barriers to discharge Decreased caregiver support      Co-evaluation               AM-PAC PT "6 Clicks" Mobility  Outcome Measure Help needed turning from your back to your side while in a flat bed without using bedrails?: A Little Help needed moving from lying on your back to sitting on the side of a flat bed without using bedrails?: A Little Help needed moving to and from a bed to a chair (including a wheelchair)?: A Little Help needed standing up from a chair using your arms (e.g., wheelchair or bedside chair)?: A Little Help needed to walk in hospital room?: A Little Help needed climbing 3-5 steps with a railing? : A Lot 6 Click Score: 17    End of Session Equipment Utilized During Treatment: Gait belt Activity Tolerance: Patient tolerated treatment well Patient left: in chair;with call bell/phone within reach;with chair alarm set;with nursing/sitter in room Nurse Communication: Mobility  status PT Visit Diagnosis: Other abnormalities of gait and mobility (R26.89);History of falling (Z91.81)    Time: 5183-3582 PT Time Calculation (min) (ACUTE ONLY): 31 min   Charges:   PT Evaluation $PT Eval Moderate Complexity: 1 Mod PT Treatments $Gait Training: 8-22 mins        Kasidee Voisin P, PT Acute Rehabilitation Services Pager: [phone] Office: [phone]   Enedina Finner Phiona Ramnauth 08/30/2021, 12:31 PM

## 2021-08-30 NOTE — Progress Notes (Signed)
[facility]  PROGRESS NOTE  Nathaniel Garcia WIO:[id number] DOB: 10/14/1946 DOA: 08/27/2021 PCP: Duanne Limerick, MD   Brief HPI:   75 year old male with history of COPD not on home oxygen, on chronic prednisone therapy 5 mg daily, history of stroke, hyperlipidemia came to ED with complaints of shortness of breath and wheezing.  In the ED patient received Solu-Medrol and albuterol and started on BiPAP overnight with improvement of symptoms.  Chest x-ray was normal with emphysematous changes.    Subjective   Patient seen and examined, mental status is significantly improved.  Breathing better.  Currently on oxygen 3 L/min via [place].   Assessment/Plan:     COPD exacerbation -Significantly improved after starting IV Solu-Medrol -Solu-Medrol dose was cut down to 60 mg IV every 12 hours due to agitation -Continue DuoNebs every 6 hours -Continue oxygen, keep O2 sats more than 92%  History of stroke -Continue Plavix, statin  BPH -Continue Flomax  Delirium -Patient became confused after starting IV Solu-Medrol -Required Haldol 5 mg x 2 doses yesterday -Delirium has resolved after dose of Solu-Medrol was cut down to 60 mg IV every 12 hours  -       Scheduled medications:    atorvastatin  80 mg Oral Daily   clopidogrel  75 mg Oral Daily   enoxaparin (LOVENOX) injection  40 mg Subcutaneous Q24H   ipratropium-albuterol  3 mL Nebulization TID   methylPREDNISolone (SOLU-MEDROL) injection  120 mg Intravenous Q24H   montelukast  10 mg Oral QHS   nicotine  21 mg Transdermal Daily   propranolol  10 mg Oral q morning   sertraline  25 mg Oral QHS   tamsulosin  0.4 mg Oral Daily     Data Reviewed:   CBG:  No results for input(s): GLUCAP in the last 168 hours.  SpO2: 97 % O2 Flow Rate (L/min): 2 L/min FiO2 (%): 40 %    Vitals:   08/30/21 0740 08/30/21 1226 08/30/21 1347 08/30/21 1405  BP:   (!) 151/62   Pulse:   85   Resp:   16   Temp:   97.9 F (36.6 C)    TempSrc:   Oral   SpO2: 99% 91% 100% 97%  Weight:      Height:         Intake/Output Summary (Last 24 hours) at 08/30/2021 1523 Last data filed at 08/30/2021 0834 Gross per 24 hour  Intake 120 ml  Output --  Net 120 ml    10/12 1901 - 10/14 0700 In: 0  Out: 200 [Urine:200]  Filed Weights   08/27/21 0235  Weight: 60.8 kg    Data Reviewed: Basic Metabolic Panel: Recent Labs  Lab 08/27/21 0230 08/27/21 0539 08/28/21 0240 08/29/21 0244 08/30/21 0433  NA 139 138 136 135 137  K 4.3 4.1 4.7 4.9 4.0  CL 108  --  103 102 102  CO2 24  --  27 21* 28  GLUCOSE 113*  --  140* 116* 85  BUN 14  --  15 20 22   CREATININE 0.97  --  1.02 1.17 1.12  CALCIUM 8.7*  --  8.8* 8.9 8.7*  MG  --   --  2.0  --   --    Liver Function Tests: Recent Labs  Lab 08/27/21 0230 08/28/21 0240  AST 16 15  ALT 16 17  ALKPHOS 47 45  BILITOT 0.2* 0.6  PROT 6.1* 5.8*  ALBUMIN 3.4* 2.9*   No results for input(s):  LIPASE, AMYLASE in the last 168 hours. No results for input(s): AMMONIA in the last 168 hours. CBC: Recent Labs  Lab 08/27/21 0230 08/27/21 0539 08/28/21 0240 08/29/21 0244 08/30/21 0433  WBC 10.5  --  10.1 12.4* 8.5  NEUTROABS 6.9  --  9.0*  --   --   HGB 14.0 12.9* 12.2* 13.7 12.8*  HCT 43.1 38.0* 37.8* 41.1 38.5*  MCV 105.1*  --  103.3* 102.8* 102.7*  PLT 182  --  188 195 163   Cardiac Enzymes: No results for input(s): CKTOTAL, CKMB, CKMBINDEX, TROPONINI in the last 168 hours. BNP (last 3 results) Recent Labs    10/29/20 2052 08/27/21 0230  BNP 84.2 38.8    ProBNP (last 3 results) No results for input(s): PROBNP in the last 8760 hours.  CBG: No results for input(s): GLUCAP in the last 168 hours.     Radiology Reports  No results found.     Antibiotics: Anti-infectives (From admission, onward)    None         DVT prophylaxis: Lovenox  Code Status: Full code  Family Communication: No family at  bedside   Consultants:   Procedures:     Objective    Physical Examination:   General-appears in no acute distress Heart-S1-S2, regular, no murmur auscultated Lungs-clear to auscultation bilaterally, no wheezing or crackles auscultated Abdomen-soft, nontender, no organomegaly Extremities-no edema in the lower extremities Neuro-alert, oriented x3, no focal deficit noted  Status is: Inpatient  Dispo: The patient is from: Home              Anticipated d/c is to: Skilled nursing facility              Anticipated d/c date is: 08/31/2021              Patient currently not stable for discharge  Barrier to discharge-ongoing COPD exacerbation  COVID-19 Labs  Recent Labs    08/27/21 2203  DDIMER 0.37    Lab Results  Component Value Date   SARSCOV2NAA NEGATIVE 08/27/2021   SARSCOV2NAA NEGATIVE 10/29/2020   SARSCOV2NAA Not Detected 10/02/2020            Recent Results (from the past 240 hour(s))  Resp Panel by RT-PCR (Flu A&B, Covid) Nasopharyngeal Swab     Status: None   Collection Time: 08/27/21  3:03 AM   Specimen: Nasopharyngeal Swab; Nasopharyngeal(NP) swabs in vial transport medium  Result Value Ref Range Status   SARS Coronavirus 2 by RT PCR NEGATIVE NEGATIVE Final    Comment: (NOTE) SARS-CoV-2 target nucleic acids are NOT DETECTED.  The SARS-CoV-2 RNA is generally detectable in upper respiratory specimens during the acute phase of infection. The lowest concentration of SARS-CoV-2 viral copies this assay can detect is 138 copies/mL. A negative result does not preclude SARS-Cov-2 infection and should not be used as the sole basis for treatment or other patient management decisions. A negative result may occur with  improper specimen collection/handling, submission of specimen other than nasopharyngeal swab, presence of viral mutation(s) within the areas targeted by this assay, and inadequate number of viral copies(<138 copies/mL). A negative result  must be combined with clinical observations, patient history, and epidemiological information. The expected result is Negative.  Fact Sheet for Patients:  BloggerCourse.com  Fact Sheet for Healthcare Providers:  SeriousBroker.it  This test is no t yet approved or cleared by the [place] FDA and  has been authorized for detection and/or diagnosis of SARS-CoV-2 by FDA under an  Emergency Use Authorization (EUA). This EUA will remain  in effect (meaning this test can be used) for the duration of the COVID-19 declaration under Section 564(b)(1) of the Act, 21 U.S.C.section 360bbb-3(b)(1), unless the authorization is terminated  or revoked sooner.       Influenza A by PCR NEGATIVE NEGATIVE Final   Influenza B by PCR NEGATIVE NEGATIVE Final    Comment: (NOTE) The Xpert Xpress SARS-CoV-2/FLU/RSV plus assay is intended as an aid in the diagnosis of influenza from Nasopharyngeal swab specimens and should not be used as a sole basis for treatment. Nasal washings and aspirates are unacceptable for Xpert Xpress SARS-CoV-2/FLU/RSV testing.  Fact Sheet for Patients: BloggerCourse.com  Fact Sheet for Healthcare Providers: SeriousBroker.it  This test is not yet approved or cleared by the [place] FDA and has been authorized for detection and/or diagnosis of SARS-CoV-2 by FDA under an Emergency Use Authorization (EUA). This EUA will remain in effect (meaning this test can be used) for the duration of the COVID-19 declaration under Section 564(b)(1) of the Act, 21 U.S.C. section 360bbb-3(b)(1), unless the authorization is terminated or revoked.  Performed at [facility], [address], [place], [place] [address]     Meredeth Ide   [facility] If 7PM-7AM, please contact night-coverage at [email or web address], Office  [phone]   08/30/2021, 3:23 PM  LOS: 3 days

## 2021-08-31 DIAGNOSIS — J441 Chronic obstructive pulmonary disease with (acute) exacerbation: Secondary | ICD-10-CM | POA: Diagnosis not present

## 2021-08-31 MED ORDER — PREDNISONE 20 MG PO TABS
40.0000 mg | ORAL_TABLET | Freq: Every day | ORAL | Status: DC
  Administered 2021-09-01 – 2021-09-04 (×4): 40 mg via ORAL
  Filled 2021-08-31 (×4): qty 2

## 2021-08-31 NOTE — Progress Notes (Signed)
[facility]  PROGRESS NOTE  Nathaniel Garcia JJO:[id number] DOB: [birth year] DOA: 08/27/2021 PCP: Duanne Limerick, MD   Brief HPI:   75 year old male with history of COPD not on home oxygen, on chronic prednisone therapy 5 mg daily, history of stroke, hyperlipidemia came to ED with complaints of shortness of breath and wheezing.  In the ED patient received Solu-Medrol and albuterol and started on BiPAP overnight with improvement of symptoms.  Chest x-ray was normal with emphysematous changes.    Subjective   Developed diarrhea yesterday, only 2 small loose stools today.  No fever, no abdominal pain.   Assessment/Plan:     COPD exacerbation -Significantly improved after starting IV Solu-Medrol -Solu-Medrol dose was cut down to 60 mg IV every 12 hours due to agitation -We will discontinue Solu-Medrol today and start prednisone 40 mg daily from tomorrow morning -Continue DuoNebs every 6 hours -Continue oxygen, keep O2 sats more than 92%  History of stroke -Continue Plavix, statin  Diarrhea -Improving, -Afebrile, no abdominal pain, no leukocytosis -Low likelihood for C. difficile  infection -We will monitor  BPH -Continue Flomax  Delirium -Patient became confused after starting IV Solu-Medrol -Required Haldol 5 mg x 2 doses yesterday -Delirium has resolved after dose of Solu-Medrol was cut down to 60 mg IV every 12 hours         Scheduled medications:    atorvastatin  80 mg Oral Daily   clopidogrel  75 mg Oral Daily   enoxaparin (LOVENOX) injection  40 mg Subcutaneous Q24H   ipratropium-albuterol  3 mL Nebulization TID   methylPREDNISolone (SOLU-MEDROL) injection  120 mg Intravenous Q24H   montelukast  10 mg Oral QHS   nicotine  21 mg Transdermal Daily   propranolol  10 mg Oral q morning   sertraline  25 mg Oral QHS   tamsulosin  0.4 mg Oral Daily     Data Reviewed:   CBG:  No results for input(s): GLUCAP in the last 168 hours.  SpO2: 99  % O2 Flow Rate (L/min): 2 L/min FiO2 (%): 40 %    Vitals:   08/30/21 1935 08/30/21 2209 08/31/21 0748 08/31/21 1412  BP:  (!) 153/71  127/66  Pulse:  69  71  Resp:  18  18  Temp:  97.7 F (36.5 C)  98.4 F (36.9 C)  TempSrc:  Oral    SpO2: 97% 99% 97% 99%  Weight:      Height:         Intake/Output Summary (Last 24 hours) at 08/31/2021 1555 Last data filed at 08/30/2021 1923 Gross per 24 hour  Intake --  Output 1 ml  Net -1 ml    10/13 1901 - 10/15 0700 In: 120 [P.O.:120] Out: 1   Filed Weights   08/27/21 0235  Weight: 60.8 kg    Data Reviewed: Basic Metabolic Panel: Recent Labs  Lab 08/27/21 0230 08/27/21 0539 08/28/21 0240 08/29/21 0244 08/30/21 0433  NA 139 138 136 135 137  K 4.3 4.1 4.7 4.9 4.0  CL 108  --  103 102 102  CO2 24  --  27 21* 28  GLUCOSE 113*  --  140* 116* 85  BUN 14  --  15 20 22   CREATININE 0.97  --  1.02 1.17 1.12  CALCIUM 8.7*  --  8.8* 8.9 8.7*  MG  --   --  2.0  --   --    Liver Function Tests: Recent Labs  Lab 08/27/21 0230 08/28/21  0240  AST 16 15  ALT 16 17  ALKPHOS 47 45  BILITOT 0.2* 0.6  PROT 6.1* 5.8*  ALBUMIN 3.4* 2.9*   No results for input(s): LIPASE, AMYLASE in the last 168 hours. No results for input(s): AMMONIA in the last 168 hours. CBC: Recent Labs  Lab 08/27/21 0230 08/27/21 0539 08/28/21 0240 08/29/21 0244 08/30/21 0433  WBC 10.5  --  10.1 12.4* 8.5  NEUTROABS 6.9  --  9.0*  --   --   HGB 14.0 12.9* 12.2* 13.7 12.8*  HCT 43.1 38.0* 37.8* 41.1 38.5*  MCV 105.1*  --  103.3* 102.8* 102.7*  PLT 182  --  188 195 163   Cardiac Enzymes: No results for input(s): CKTOTAL, CKMB, CKMBINDEX, TROPONINI in the last 168 hours. BNP (last 3 results) Recent Labs    10/29/20 2052 08/27/21 0230  BNP 84.2 38.8    ProBNP (last 3 results) No results for input(s): PROBNP in the last 8760 hours.  CBG: No results for input(s): GLUCAP in the last 168 hours.     Radiology Reports  No results  found.     Antibiotics: Anti-infectives (From admission, onward)    None         DVT prophylaxis: Lovenox  Code Status: Full code  Family Communication: No family at bedside   Consultants:   Procedures:     Objective    Physical Examination:   General-appears in no acute distress Heart-S1-S2, regular, no murmur auscultated Lungs-clear to auscultation bilaterally, no wheezing or crackles auscultated Abdomen-soft, nontender, no organomegaly Extremities-no edema in the lower extremities Neuro-alert, oriented x3, no focal deficit noted  Status is: Inpatient  Dispo: The patient is from: Home              Anticipated d/c is to: Skilled nursing facility              Anticipated d/c date is: 09/02/2021              Patient currently not stable for discharge  Barrier to discharge-ongoing COPD exacerbation  COVID-19 Labs  No results for input(s): DDIMER, FERRITIN, LDH, CRP in the last 72 hours.   Lab Results  Component Value Date   SARSCOV2NAA NEGATIVE 08/27/2021   SARSCOV2NAA NEGATIVE 10/29/2020   SARSCOV2NAA Not Detected 10/02/2020            Recent Results (from the past 240 hour(s))  Resp Panel by RT-PCR (Flu A&B, Covid) Nasopharyngeal Swab     Status: None   Collection Time: 08/27/21  3:03 AM   Specimen: Nasopharyngeal Swab; Nasopharyngeal(NP) swabs in vial transport medium  Result Value Ref Range Status   SARS Coronavirus 2 by RT PCR NEGATIVE NEGATIVE Final    Comment: (NOTE) SARS-CoV-2 target nucleic acids are NOT DETECTED.  The SARS-CoV-2 RNA is generally detectable in upper respiratory specimens during the acute phase of infection. The lowest concentration of SARS-CoV-2 viral copies this assay can detect is 138 copies/mL. A negative result does not preclude SARS-Cov-2 infection and should not be used as the sole basis for treatment or other patient management decisions. A negative result may occur with  improper specimen  collection/handling, submission of specimen other than nasopharyngeal swab, presence of viral mutation(s) within the areas targeted by this assay, and inadequate number of viral copies(<138 copies/mL). A negative result must be combined with clinical observations, patient history, and epidemiological information. The expected result is Negative.  Fact Sheet for Patients:  BloggerCourse.com  Fact Sheet for Healthcare  Providers:  SeriousBroker.it  This test is no t yet approved or cleared by the [place] and  has been authorized for detection and/or diagnosis of SARS-CoV-2 by FDA under an Emergency Use Authorization (EUA). This EUA will remain  in effect (meaning this test can be used) for the duration of the COVID-19 declaration under Section 564(b)(1) of the Act, 21 U.S.C.section 360bbb-3(b)(1), unless the authorization is terminated  or revoked sooner.       Influenza A by PCR NEGATIVE NEGATIVE Final   Influenza B by PCR NEGATIVE NEGATIVE Final    Comment: (NOTE) The Xpert Xpress SARS-CoV-2/FLU/RSV plus assay is intended as an aid in the diagnosis of influenza from Nasopharyngeal swab specimens and should not be used as a sole basis for treatment. Nasal washings and aspirates are unacceptable for Xpert Xpress SARS-CoV-2/FLU/RSV testing.  Fact Sheet for Patients: BloggerCourse.com  Fact Sheet for Healthcare Providers: SeriousBroker.it  This test is not yet approved or cleared by the [place] FDA and has been authorized for detection and/or diagnosis of SARS-CoV-2 by FDA under an Emergency Use Authorization (EUA). This EUA will remain in effect (meaning this test can be used) for the duration of the COVID-19 declaration under Section 564(b)(1) of the Act, 21 U.S.C. section 360bbb-3(b)(1), unless the authorization is terminated or revoked.  Performed at [facility], [address], [place], [place] [address]     Meredeth Ide   [facility] If 7PM-7AM, please contact night-coverage at [email or web address], Office  [phone]   08/31/2021, 3:55 PM  LOS: 4 days

## 2021-08-31 NOTE — TOC Initial Note (Signed)
Transition of Care [facility]) - Initial/Assessment Note    Patient Details  Name: Nathaniel Garcia MRN: [id number] Date of Birth: 31-May-1946  [facility]) CM/SW Contact:    Ralene Bathe, LCSWA Phone Number: 08/31/2021, 9:33 AM  Clinical Narrative:                 CSW received consult for possible SNF placement at time of discharge. CSW spoke with patient. Patient reported that he lives alone and does not have anyone to assist if he was to return home. Patient expressed understanding of PT recommendation and is partially agreeable to SNF placement at time of discharge. The patient is still considering home with home health.  Patient did not express an agency preference.  CSW discussed insurance authorization process and will provide Medicare SNF ratings list. Patient has received 2 COVID vaccines and is not willing to receive a booster. Patient expressed being hopeful for rehab and to feel better soon. No further questions reported at this time.   Skilled Nursing Rehab Facilities-   [employer]uk Ratings out of 5 possible    Name Address  Phone # Quality Care Staffing Health Inspection Overall  [facility] [address] [place] [phone] 5 1 4 4   [facility]  [address], [place] [phone] 3 1 5 4   [facility] [address] [place], [address] [place] 3 1 1 1   [facility] [address] 2 2 4 4   [facility] [address] [phone] 3 1 2 1   [facility] [address], [phone] 3 2 4 4   [facility] [address] [place] 5 1 2 2   [facility] [address], [facility] [place] 5 2 2 3   [facility] [address] [facility] 5 1 2 2   [facility] [phone], [phone] 5 1 2 2   [facility] [address],  [facility] [phone] 5 1 2 2   [facility]. [address] 3 1 1 1      Expected Discharge Plan: Skilled Nursing Facility Barriers to Discharge: Insurance Authorization, SNF Pending bed offer   Patient Goals and CMS Choice   CMS Medicare.gov Compare Post Acute Care list provided to:: Patient Choice offered to / list presented to : Patient  Expected Discharge Plan and Services Expected Discharge Plan: Skilled Nursing Facility       Living arrangements for the past 2 months: Apartment                                      Prior Living Arrangements/Services Living arrangements for the past 2 months: Apartment Lives with:: Self Patient language and need for interpreter reviewed:: Yes Do you feel safe going back to the place where you live?: Yes      Need for Family Participation in Patient Care: Yes (Comment) Care giver support system in place?: No (comment)   Criminal Activity/Legal Involvement Pertinent to Current Situation/Hospitalization: No - Comment as needed  Activities of Daily Living Home Assistive Devices/Equipment: None ADL Screening (condition at time of admission) Patient's cognitive ability adequate to safely complete daily activities?: Yes Is the patient deaf or have difficulty hearing?: No Does the patient have difficulty seeing, even when wearing glasses/contacts?: No Does the patient have difficulty concentrating, remembering, or making decisions?:  No Patient able to express need for assistance with ADLs?: No Does the patient have difficulty dressing or bathing?: No Independently performs ADLs?: Yes (appropriate for developmental age) Does the patient have difficulty walking or climbing stairs?: Yes Weakness of Legs: None Weakness of Arms/Hands: None  Permission Sought/Granted   Permission granted to share information with : Yes, Verbal Permission Granted     Permission granted to share info w AGENCY:  SNF        Emotional Assessment Appearance:: Appears older than stated age Attitude/Demeanor/Rapport: Flirtatious (Comment), Engaged Affect (typically observed): Accepting Orientation: : Oriented to Situation, Oriented to  Time, Oriented to Place, Oriented to Self Alcohol / Substance Use: Not Applicable Psych Involvement: No (comment)  Admission diagnosis:  Respiratory distress [R06.03] COPD exacerbation (HCC) [J44.1] COPD with acute exacerbation (HCC) [J44.1] Patient Active Problem List   Diagnosis Date Noted   COPD with acute exacerbation (HCC) 01/03/2021   Bullous emphysema (HCC) 01/03/2021   Acute exacerbation of chronic obstructive pulmonary disease (COPD) (HCC) 10/31/2020   COPD exacerbation (HCC) 10/29/2020   Acute respiratory failure with hypoxia (HCC) 10/29/2020   History of stroke 10/29/2020   Aortic atherosclerosis (HCC) 12/30/2018   Benign hematuria 09/18/2016   Weakness of left hand 07/02/2016   Lipid screening 01/03/2016   Cerebral vascular disease 09/14/2015   Carotid artery narrowing 08/15/2015   Bilateral carotid artery stenosis 08/15/2015   Bradycardia 08/01/2015   H/O transient cerebral ischemia 08/01/2015   Combined fat and carbohydrate induced hyperlipemia 08/01/2015   Breathlessness on exertion 07/30/2015   Absolute anemia 04/03/2015   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   Cerebral vascular accident (HCC) 04/03/2015   HLD (hyperlipidemia) 04/03/2015   Blood glucose elevated 04/03/2015   Lung nodule, solitary 04/03/2015   Non compliance with medical treatment 04/03/2015   Lung nodule, multiple 04/03/2015   Cerebral infarction (HCC) 04/03/2015   Abnormal lung field 04/03/2015   PCP:  Duanne Limerick, MD Pharmacy:   [facility] [phone] - [place], [place] - [address] AT [facility] [address] [place] [address] Phone: [phone] Fax: [phone]     Social Determinants of Health (SDOH)  Interventions    Readmission Risk Interventions No flowsheet data found.

## 2021-08-31 NOTE — Progress Notes (Signed)
Pt refused bipap tonight. Bipap stand by. Advised to let RN know if he changes his mind. No resp distress noted at this time.

## 2021-08-31 NOTE — [place] FL2 (Signed)
[place] MEDICAID FL2 LEVEL OF CARE SCREENING TOOL     IDENTIFICATION  Patient Name: Nathaniel Garcia Birthdate: 05/21/1946 Sex: male Admission Date (Current Location): 08/27/2021  [facility] and [place] Number:  Producer, television/film/video and Address:         Provider Number: [id number]  Attending Physician Name and Address:  Meredeth Ide, MD  Relative Name and Phone Number:  Krystian, [place] (Spouse)   [phone]    Current Level of Care: Hospital Recommended Level of Care: Skilled Nursing Facility Prior Approval Number:    Date Approved/Denied:   PASRR Number:    Discharge Plan: SNF    Current Diagnoses: Patient Active Problem List   Diagnosis Date Noted   COPD with acute exacerbation (HCC) 01/03/2021   Bullous emphysema (HCC) 01/03/2021   Acute exacerbation of chronic obstructive pulmonary disease (COPD) (HCC) 10/31/2020   COPD exacerbation (HCC) 10/29/2020   Acute respiratory failure with hypoxia (HCC) 10/29/2020   History of stroke 10/29/2020   Aortic atherosclerosis (HCC) 12/30/2018   Benign hematuria 09/18/2016   Weakness of left hand 07/02/2016   Lipid screening 01/03/2016   Cerebral vascular disease 09/14/2015   Carotid artery narrowing 08/15/2015   Bilateral carotid artery stenosis 08/15/2015   Bradycardia 08/01/2015   H/O transient cerebral ischemia 08/01/2015   Combined fat and carbohydrate induced hyperlipemia 08/01/2015   Breathlessness on exertion 07/30/2015   Absolute anemia 04/03/2015   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   Cerebral vascular accident (HCC) 04/03/2015   HLD (hyperlipidemia) 04/03/2015   Blood glucose elevated 04/03/2015   Lung nodule, solitary 04/03/2015   Non compliance with medical treatment 04/03/2015   Lung nodule, multiple 04/03/2015   Cerebral infarction (HCC) 04/03/2015   Abnormal lung field 04/03/2015    Orientation RESPIRATION BLADDER Height & Weight     Self, Situation, Time, Place  Normal Incontinent  Weight: 134 lb (60.8 kg) Height:  6\' 1"  (185.4 cm)  BEHAVIORAL SYMPTOMS/MOOD NEUROLOGICAL BOWEL NUTRITION STATUS      Continent Diet (see d/c summary)  AMBULATORY STATUS COMMUNICATION OF NEEDS Skin   Limited Assist Verbally Normal                       Personal Care Assistance Level of Assistance  Feeding, Dressing, Bathing Bathing Assistance: Limited assistance   Dressing Assistance: Limited assistance     Functional Limitations Info  Speech, Sight, Hearing Sight Info: Adequate Hearing Info: Adequate Speech Info: Adequate    SPECIAL CARE FACTORS FREQUENCY  PT (By licensed PT), OT (By licensed OT)     PT Frequency: 5x/ week OT Frequency: 5x/ week            Contractures Contractures Info: Not present    Additional Factors Info  Code Status, Allergies Code Status Info: FULL Allergies Info: Hctz (hydrochlorothiazide)           Current Medications (08/31/2021):  This is the current hospital active medication list Current Facility-Administered Medications  Medication Dose Route Frequency Provider Last Rate Last Admin   acetaminophen (TYLENOL) tablet 650 mg  650 mg Oral Q6H PRN 09/02/2021, MD   650 mg at 08/30/21 1552   Or   acetaminophen (TYLENOL) suppository 650 mg  650 mg Rectal Q6H PRN Shalhoub, 09/01/21, MD       atorvastatin (LIPITOR) tablet 80 mg  80 mg Oral Daily Deno Lunger, MD   80 mg at 08/31/21 09/02/21   clopidogrel (PLAVIX) tablet 75 mg  75  mg Oral Daily Marinda Elk, MD   75 mg at 08/31/21 0821   enoxaparin (LOVENOX) injection 40 mg  40 mg Subcutaneous Q24H Marinda Elk, MD   40 mg at 08/30/21 1553   ipratropium-albuterol (DUONEB) 0.5-2.5 (3) MG/3ML nebulizer solution 3 mL  3 mL Nebulization Q2H PRN Marinda Elk, MD   3 mL at 08/28/21 2236   ipratropium-albuterol (DUONEB) 0.5-2.5 (3) MG/3ML nebulizer solution 3 mL  3 mL Nebulization TID Meredeth Ide, MD   3 mL at 08/31/21 0748   LORazepam (ATIVAN) injection 0.5 mg  0.5  mg Intravenous Q6H PRN Dorcas Carrow, MD   0.5 mg at 08/29/21 0408   methylPREDNISolone sodium succinate (SOLU-MEDROL) 125 mg/2 mL injection 120 mg  120 mg Intravenous Q24H Meredeth Ide, MD   120 mg at 08/31/21 0818   montelukast (SINGULAIR) tablet 10 mg  10 mg Oral QHS Marinda Elk, MD   10 mg at 08/30/21 2205   nicotine (NICODERM CQ - dosed in mg/24 hours) patch 21 mg  21 mg Transdermal Daily Meredeth Ide, MD   21 mg at 08/31/21 0822   ondansetron (ZOFRAN) tablet 4 mg  4 mg Oral Q6H PRN Marinda Elk, MD       Or   ondansetron [facility]) injection 4 mg  4 mg Intravenous Q6H PRN Shalhoub, Deno Lunger, MD       propranolol (INDERAL) tablet 10 mg  10 mg Oral q morning Shalhoub, Deno Lunger, MD   10 mg at 08/31/21 0830   sertraline (ZOLOFT) tablet 25 mg  25 mg Oral QHS Marinda Elk, MD   25 mg at 08/30/21 2205   tamsulosin (FLOMAX) capsule 0.4 mg  0.4 mg Oral Daily Shalhoub, Deno Lunger, MD   0.4 mg at 08/31/21 3532     Discharge Medications: Please see discharge summary for a list of discharge medications.  Relevant Imaging Results:  Relevant Lab Results:   Additional Information SSN:  [id number]; Pfizer COVID-19 Vaccine 01/23/2020 , 12/29/2019; (6'1" 134lbs)  [employer] Reeta Kuk, LCSWA

## 2021-09-01 DIAGNOSIS — J441 Chronic obstructive pulmonary disease with (acute) exacerbation: Secondary | ICD-10-CM | POA: Diagnosis not present

## 2021-09-01 MED ORDER — IPRATROPIUM-ALBUTEROL 0.5-2.5 (3) MG/3ML IN SOLN
3.0000 mL | Freq: Four times a day (QID) | RESPIRATORY_TRACT | Status: DC | PRN
  Administered 2021-09-01: 3 mL via RESPIRATORY_TRACT
  Filled 2021-09-01: qty 3

## 2021-09-01 NOTE — Progress Notes (Signed)
[facility]  PROGRESS NOTE  Nathaniel Garcia OZD:[id number] DOB: [birth year] DOA: 08/27/2021 PCP: Duanne Limerick, MD   Brief HPI:   75 year old male with history of COPD not on home oxygen, on chronic prednisone therapy 5 mg daily, history of stroke, hyperlipidemia came to ED with complaints of shortness of breath and wheezing.  In the ED patient received Solu-Medrol and albuterol and started on BiPAP overnight with improvement of symptoms.  Chest x-ray was normal with emphysematous changes.    Subjective   Patient seen and examined, denies shortness of breath.  Currently not requiring oxygen.  He refused BiPAP last night.  O2 sats 97% on room air   Assessment/Plan:     COPD exacerbation -Significantly improved after starting IV Solu-Medrol -Solu-Medrol dose was cut down to 60 mg IV every 12 hours due to agitation -IV Solu-Medrol was discontinued and patient started on prednisone 40 mg daily from today  -Continue DuoNebs every 6 hours -Continue oxygen, keep O2 sats more than 92% -Continue montelukast  History of stroke -Continue Plavix, statin  Diarrhea -Resolved -He developed loose stools 2 days ago, currently resolved -Afebrile, no abdominal pain, no leukocytosis -Low likelihood for C. difficile  infection -We will monitor  BPH -Continue Flomax  Delirium -Patient became confused after starting IV Solu-Medrol -Required Haldol 5 mg x 2 doses yesterday -Delirium has resolved after dose of Solu-Medrol was cut down to 60 mg IV every 12 hours         Scheduled medications:    atorvastatin  80 mg Oral Daily   clopidogrel  75 mg Oral Daily   enoxaparin (LOVENOX) injection  40 mg Subcutaneous Q24H   montelukast  10 mg Oral QHS   nicotine  21 mg Transdermal Daily   predniSONE  40 mg Oral Q breakfast   propranolol  10 mg Oral q morning   sertraline  25 mg Oral QHS   tamsulosin  0.4 mg Oral Daily     Data Reviewed:   CBG:  No results for input(s):  GLUCAP in the last 168 hours.  SpO2: 97 % O2 Flow Rate (L/min): 2 L/min FiO2 (%): 40 %    Vitals:   08/31/21 1952 08/31/21 2100 09/01/21 0341 09/01/21 0801  BP:  (!) 155/75 (!) 164/85 123/60  Pulse:  72 88 75  Resp:  16 18   Temp:  97.7 F (36.5 C) 97.9 F (36.6 C) 97.9 F (36.6 C)  TempSrc:  Oral Oral Oral  SpO2: 98%  99% 97%  Weight:      Height:         Intake/Output Summary (Last 24 hours) at 09/01/2021 1121 Last data filed at 08/31/2021 2100 Gross per 24 hour  Intake 120 ml  Output --  Net 120 ml    10/14 1901 - 10/16 0700 In: 120 [P.O.:120] Out: 1   Filed Weights   08/27/21 0235  Weight: 60.8 kg    Data Reviewed: Basic Metabolic Panel: Recent Labs  Lab 08/27/21 0230 08/27/21 0539 08/28/21 0240 08/29/21 0244 08/30/21 0433  NA 139 138 136 135 137  K 4.3 4.1 4.7 4.9 4.0  CL 108  --  103 102 102  CO2 24  --  27 21* 28  GLUCOSE 113*  --  140* 116* 85  BUN 14  --  15 20 22   CREATININE 0.97  --  1.02 1.17 1.12  CALCIUM 8.7*  --  8.8* 8.9 8.7*  MG  --   --  2.0  --   --  Liver Function Tests: Recent Labs  Lab 08/27/21 0230 08/28/21 0240  AST 16 15  ALT 16 17  ALKPHOS 47 45  BILITOT 0.2* 0.6  PROT 6.1* 5.8*  ALBUMIN 3.4* 2.9*   No results for input(s): LIPASE, AMYLASE in the last 168 hours. No results for input(s): AMMONIA in the last 168 hours. CBC: Recent Labs  Lab 08/27/21 0230 08/27/21 0539 08/28/21 0240 08/29/21 0244 08/30/21 0433  WBC 10.5  --  10.1 12.4* 8.5  NEUTROABS 6.9  --  9.0*  --   --   HGB 14.0 12.9* 12.2* 13.7 12.8*  HCT 43.1 38.0* 37.8* 41.1 38.5*  MCV 105.1*  --  103.3* 102.8* 102.7*  PLT 182  --  188 195 163   Cardiac Enzymes: No results for input(s): CKTOTAL, CKMB, CKMBINDEX, TROPONINI in the last 168 hours. BNP (last 3 results) Recent Labs    10/29/20 2052 08/27/21 0230  BNP 84.2 38.8    ProBNP (last 3 results) No results for input(s): PROBNP in the last 8760 hours.  CBG: No results for input(s):  GLUCAP in the last 168 hours.     Radiology Reports  No results found.     Antibiotics: Anti-infectives (From admission, onward)    None         DVT prophylaxis: Lovenox  Code Status: Full code  Family Communication: No family at bedside   Consultants:   Procedures:     Objective    Physical Examination:   General-appears in no acute distress Heart-S1-S2, regular, no murmur auscultated Lungs-clear to auscultation bilaterally, no wheezing or crackles auscultated Abdomen-soft, nontender, no organomegaly Extremities-no edema in the lower extremities Neuro-alert, oriented x3, no focal deficit noted  Status is: Inpatient  Dispo: The patient is from: Home              Anticipated d/c is to: Skilled nursing facility              Anticipated d/c date is: 09/02/2021              Patient currently not stable for discharge  Barrier to discharge-ongoing COPD exacerbation  COVID-19 Labs  No results for input(s): DDIMER, FERRITIN, LDH, CRP in the last 72 hours.   Lab Results  Component Value Date   SARSCOV2NAA NEGATIVE 08/27/2021   SARSCOV2NAA NEGATIVE 10/29/2020   SARSCOV2NAA Not Detected 10/02/2020            Recent Results (from the past 240 hour(s))  Resp Panel by RT-PCR (Flu A&B, Covid) Nasopharyngeal Swab     Status: None   Collection Time: 08/27/21  3:03 AM   Specimen: Nasopharyngeal Swab; Nasopharyngeal(NP) swabs in vial transport medium  Result Value Ref Range Status   SARS Coronavirus 2 by RT PCR NEGATIVE NEGATIVE Final    Comment: (NOTE) SARS-CoV-2 target nucleic acids are NOT DETECTED.  The SARS-CoV-2 RNA is generally detectable in upper respiratory specimens during the acute phase of infection. The lowest concentration of SARS-CoV-2 viral copies this assay can detect is 138 copies/mL. A negative result does not preclude SARS-Cov-2 infection and should not be used as the sole basis for treatment or other patient management  decisions. A negative result may occur with  improper specimen collection/handling, submission of specimen other than nasopharyngeal swab, presence of viral mutation(s) within the areas targeted by this assay, and inadequate number of viral copies(<138 copies/mL). A negative result must be combined with clinical observations, patient history, and epidemiological information. The expected result is Negative.  Fact  Sheet for Patients:  BloggerCourse.com  Fact Sheet for Healthcare Providers:  SeriousBroker.it  This test is no t yet approved or cleared by the [place] FDA and  has been authorized for detection and/or diagnosis of SARS-CoV-2 by FDA under an Emergency Use Authorization (EUA). This EUA will remain  in effect (meaning this test can be used) for the duration of the COVID-19 declaration under Section 564(b)(1) of the Act, 21 U.S.C.section 360bbb-3(b)(1), unless the authorization is terminated  or revoked sooner.       Influenza A by PCR NEGATIVE NEGATIVE Final   Influenza B by PCR NEGATIVE NEGATIVE Final    Comment: (NOTE) The Xpert Xpress SARS-CoV-2/FLU/RSV plus assay is intended as an aid in the diagnosis of influenza from Nasopharyngeal swab specimens and should not be used as a sole basis for treatment. Nasal washings and aspirates are unacceptable for Xpert Xpress SARS-CoV-2/FLU/RSV testing.  Fact Sheet for Patients: BloggerCourse.com  Fact Sheet for Healthcare Providers: SeriousBroker.it  This test is not yet approved or cleared by the [place] FDA and has been authorized for detection and/or diagnosis of SARS-CoV-2 by FDA under an Emergency Use Authorization (EUA). This EUA will remain in effect (meaning this test can be used) for the duration of the COVID-19 declaration under Section 564(b)(1) of the Act, 21 U.S.C. section 360bbb-3(b)(1), unless the  authorization is terminated or revoked.  Performed at [facility], [address], [place], [place] [address]     Meredeth Ide   [facility] If 7PM-7AM, please contact night-coverage at [email or web address], Office  [phone]   09/01/2021, 11:21 AM  LOS: 5 days

## 2021-09-02 DIAGNOSIS — R0603 Acute respiratory distress: Secondary | ICD-10-CM

## 2021-09-02 DIAGNOSIS — J441 Chronic obstructive pulmonary disease with (acute) exacerbation: Secondary | ICD-10-CM | POA: Diagnosis not present

## 2021-09-02 NOTE — Progress Notes (Signed)
[facility]  PROGRESS NOTE  Nathaniel Garcia LNL:[id number] DOB: 1946/09/24 DOA: 08/27/2021 PCP: Duanne Limerick, MD   Brief HPI:   75 year old male with history of COPD not on home oxygen, on chronic prednisone therapy 5 mg daily, history of stroke, hyperlipidemia came to ED with complaints of shortness of breath and wheezing.  In the ED patient received Solu-Medrol and albuterol and started on BiPAP overnight with improvement of symptoms.  Chest x-ray was normal with emphysematous changes.    Subjective   Patient seen and examined, no new complaints   Assessment/Plan:     COPD exacerbation -Significantly improved after starting IV Solu-Medrol -Solu-Medrol dose was cut down to 60 mg IV every 12 hours due to agitation -IV Solu-Medrol was discontinued and patient started on prednisone 40 mg daily from today  -Continue DuoNebs every 6 hours -Continue oxygen, keep O2 sats more than 92% -Continue montelukast  History of stroke -Continue Plavix, statin  Diarrhea -Resolved -He developed loose stools 2 days ago, currently resolved -Afebrile, no abdominal pain, no leukocytosis -Low likelihood for C. difficile  infection -We will monitor  BPH -Continue Flomax  Delirium -Patient became confused after starting IV Solu-Medrol -Required Haldol 5 mg x 2 doses yesterday -Delirium has resolved after dose of Solu-Medrol was cut down to 60 mg IV every 12 hours         Scheduled medications:    atorvastatin  80 mg Oral Daily   clopidogrel  75 mg Oral Daily   enoxaparin (LOVENOX) injection  40 mg Subcutaneous Q24H   montelukast  10 mg Oral QHS   nicotine  21 mg Transdermal Daily   predniSONE  40 mg Oral Q breakfast   propranolol  10 mg Oral q morning   sertraline  25 mg Oral QHS   tamsulosin  0.4 mg Oral Daily     Data Reviewed:   CBG:  No results for input(s): GLUCAP in the last 168 hours.  SpO2: 96 % O2 Flow Rate (L/min): 2 L/min FiO2 (%): 40 %     Vitals:   09/01/21 1513 09/01/21 2100 09/02/21 0321 09/02/21 1359  BP: 130/76 (!) 120/56 127/61 (!) 120/57  Pulse: 66 72 73 (!) 141  Resp: 20 17 16    Temp: 98.7 F (37.1 C) 97.9 F (36.6 C) 98.1 F (36.7 C) 98.8 F (37.1 C)  TempSrc: Oral Oral Oral   SpO2: 97% 99% 97% 96%  Weight:      Height:         Intake/Output Summary (Last 24 hours) at 09/02/2021 1652 Last data filed at 09/02/2021 09/04/2021 Gross per 24 hour  Intake 480 ml  Output --  Net 480 ml    10/15 1901 - 10/17 0700 In: 600 [P.O.:600] Out: -   Filed Weights   08/27/21 0235  Weight: 60.8 kg    Data Reviewed: Basic Metabolic Panel: Recent Labs  Lab 08/27/21 0230 08/27/21 0539 08/28/21 0240 08/29/21 0244 08/30/21 0433  NA 139 138 136 135 137  K 4.3 4.1 4.7 4.9 4.0  CL 108  --  103 102 102  CO2 24  --  27 21* 28  GLUCOSE 113*  --  140* 116* 85  BUN 14  --  15 20 22   CREATININE 0.97  --  1.02 1.17 1.12  CALCIUM 8.7*  --  8.8* 8.9 8.7*  MG  --   --  2.0  --   --    Liver Function Tests: Recent Labs  Lab  08/27/21 0230 08/28/21 0240  AST 16 15  ALT 16 17  ALKPHOS 47 45  BILITOT 0.2* 0.6  PROT 6.1* 5.8*  ALBUMIN 3.4* 2.9*   No results for input(s): LIPASE, AMYLASE in the last 168 hours. No results for input(s): AMMONIA in the last 168 hours. CBC: Recent Labs  Lab 08/27/21 0230 08/27/21 0539 08/28/21 0240 08/29/21 0244 08/30/21 0433  WBC 10.5  --  10.1 12.4* 8.5  NEUTROABS 6.9  --  9.0*  --   --   HGB 14.0 12.9* 12.2* 13.7 12.8*  HCT 43.1 38.0* 37.8* 41.1 38.5*  MCV 105.1*  --  103.3* 102.8* 102.7*  PLT 182  --  188 195 163   Cardiac Enzymes: No results for input(s): CKTOTAL, CKMB, CKMBINDEX, TROPONINI in the last 168 hours. BNP (last 3 results) Recent Labs    10/29/20 2052 08/27/21 0230  BNP 84.2 38.8    ProBNP (last 3 results) No results for input(s): PROBNP in the last 8760 hours.  CBG: No results for input(s): GLUCAP in the last 168 hours.     Radiology  Reports  No results found.     Antibiotics: Anti-infectives (From admission, onward)    None         DVT prophylaxis: Lovenox  Code Status: Full code  Family Communication: No family at bedside   Consultants:   Procedures:     Objective    Physical Examination:  General-appears in no acute distress Heart-S1-S2, regular, no murmur auscultated Lungs-clear to auscultation bilaterally, no wheezing or crackles auscultated Abdomen-soft, nontender, no organomegaly Extremities-no edema in the lower extremities Neuro-alert, oriented x3, no focal deficit noted  Status is: Inpatient  Dispo: The patient is from: Home              Anticipated d/c is to: Skilled nursing facility              Anticipated d/c date is: 09/03/2021              Patient currently not stable for discharge  Barrier to discharge-ongoing COPD exacerbation  COVID-19 Labs  No results for input(s): DDIMER, FERRITIN, LDH, CRP in the last 72 hours.   Lab Results  Component Value Date   SARSCOV2NAA NEGATIVE 08/27/2021   SARSCOV2NAA NEGATIVE 10/29/2020   SARSCOV2NAA Not Detected 10/02/2020            Recent Results (from the past 240 hour(s))  Resp Panel by RT-PCR (Flu A&B, Covid) Nasopharyngeal Swab     Status: None   Collection Time: 08/27/21  3:03 AM   Specimen: Nasopharyngeal Swab; Nasopharyngeal(NP) swabs in vial transport medium  Result Value Ref Range Status   SARS Coronavirus 2 by RT PCR NEGATIVE NEGATIVE Final    Comment: (NOTE) SARS-CoV-2 target nucleic acids are NOT DETECTED.  The SARS-CoV-2 RNA is generally detectable in upper respiratory specimens during the acute phase of infection. The lowest concentration of SARS-CoV-2 viral copies this assay can detect is 138 copies/mL. A negative result does not preclude SARS-Cov-2 infection and should not be used as the sole basis for treatment or other patient management decisions. A negative result may occur with  improper  specimen collection/handling, submission of specimen other than nasopharyngeal swab, presence of viral mutation(s) within the areas targeted by this assay, and inadequate number of viral copies(<138 copies/mL). A negative result must be combined with clinical observations, patient history, and epidemiological information. The expected result is Negative.  Fact Sheet for Patients:  BloggerCourse.com  Fact Sheet  for Healthcare Providers:  SeriousBroker.it  This test is no t yet approved or cleared by the [place] and  has been authorized for detection and/or diagnosis of SARS-CoV-2 by FDA under an Emergency Use Authorization (EUA). This EUA will remain  in effect (meaning this test can be used) for the duration of the COVID-19 declaration under Section 564(b)(1) of the Act, 21 U.S.C.section 360bbb-3(b)(1), unless the authorization is terminated  or revoked sooner.       Influenza A by PCR NEGATIVE NEGATIVE Final   Influenza B by PCR NEGATIVE NEGATIVE Final    Comment: (NOTE) The Xpert Xpress SARS-CoV-2/FLU/RSV plus assay is intended as an aid in the diagnosis of influenza from Nasopharyngeal swab specimens and should not be used as a sole basis for treatment. Nasal washings and aspirates are unacceptable for Xpert Xpress SARS-CoV-2/FLU/RSV testing.  Fact Sheet for Patients: BloggerCourse.com  Fact Sheet for Healthcare Providers: SeriousBroker.it  This test is not yet approved or cleared by the [place] FDA and has been authorized for detection and/or diagnosis of SARS-CoV-2 by FDA under an Emergency Use Authorization (EUA). This EUA will remain in effect (meaning this test can be used) for the duration of the COVID-19 declaration under Section 564(b)(1) of the Act, 21 U.S.C. section 360bbb-3(b)(1), unless the authorization is terminated or revoked.  Performed at  [facility], [address], [place], [place] [address]     Meredeth Ide   [facility] If 7PM-7AM, please contact night-coverage at [email or web address], Office  [phone]   09/02/2021, 4:52 PM  LOS: 6 days

## 2021-09-02 NOTE — Progress Notes (Signed)
Pt stated MD told pt he did not need to wear Bipap.  Pt refusing to used it tonight.

## 2021-09-02 NOTE — Progress Notes (Signed)
Physical Therapy Treatment Patient Details Name: Nathaniel Garcia MRN: [id number] DOB: Feb 22, 1946 Today's Date: 09/02/2021   History of Present Illness 75 yo admitted 10/11 with SOB and COPD exacerbation with AMS after SoluMedrol. PMHx: COPD, CVA, HLD    PT Comments    Pt sitting EOB looking at menu upon PT arrival to room, sister at bedside. Pt agreeable to OOB therapy. Pt ambulatory in hallway with RW and close guard for safety, PT providing mod form and safety cues but pt rarely applies feedback. Pt with + dyspnea on exertion but VSS (SpO2 98% RA, HR 90s during gait). Given pt safety awareness and mobility deficits, continuing to recommend SNF post-acutely.    Recommendations for follow up therapy are one component of a multi-disciplinary discharge planning process, led by the attending physician.  Recommendations may be updated based on patient status, additional functional criteria and insurance authorization.  Follow Up Recommendations  SNF;Supervision for mobility/OOB     Equipment Recommendations  Rolling walker with 5" wheels;3in1 (PT)    Recommendations for Other Services       Precautions / Restrictions Precautions Precautions: Fall Restrictions Weight Bearing Restrictions: No     Mobility  Bed Mobility Overal bed mobility: Needs Assistance             General bed mobility comments: sitting EOB    Transfers Overall transfer level: Needs assistance Equipment used: Rolling walker (2 wheeled);None Transfers: Sit to/from Stand Sit to Stand: Min guard         General transfer comment: close guard for safety, verbal cuing for hand placement when rising/sitting. STS x3, from EOB x1 with RW x1 without and x1 from recliner  Ambulation/Gait Ambulation/Gait assistance: Min guard Gait Distance (Feet): 90 Feet Assistive device: Rolling walker (2 wheeled) Gait Pattern/deviations: Step-through pattern;Decreased stride length;Trunk flexed Gait velocity:  decr   General Gait Details: close guard for safety, verbal cuing for upright posture and placement within RW multiple times, not followed well.   Stairs             Wheelchair Mobility    Modified Rankin (Stroke Patients Only)       Balance Overall balance assessment: Needs assistance   Sitting balance-Leahy Scale: Fair Sitting balance - Comments: toilet and EOB without assist   Standing balance support: No upper extremity supported Standing balance-Leahy Scale: Fair Standing balance comment: reliant on at least SL support (reaches for environment or PT hand otherwise)                            Cognition Arousal/Alertness: Awake/alert Behavior During Therapy: Flat affect Overall Cognitive Status: Impaired/Different from baseline Area of Impairment: Safety/judgement                         Safety/Judgement: Decreased awareness of deficits;Decreased awareness of safety     General Comments: Pt joking throughout session, demonstrates poor safety awareness and consistent carryover of PT cues      Exercises      General Comments General comments (skin integrity, edema, etc.): HR 90 bpm, SPO2 98% on RA during gait      Pertinent Vitals/Pain Pain Assessment: No/denies pain    Home Living                      Prior Function            PT Goals (current goals can  now be found in the care plan section) Acute Rehab PT Goals PT Goal Formulation: With patient Time For Goal Achievement: 09/13/21 Potential to Achieve Goals: Fair Progress towards PT goals: Progressing toward goals    Frequency           PT Plan Current plan remains appropriate    Co-evaluation              AM-PAC PT "6 Clicks" Mobility   Outcome Measure  Help needed turning from your back to your side while in a flat bed without using bedrails?: A Little Help needed moving from lying on your back to sitting on the side of a flat bed without using  bedrails?: A Little Help needed moving to and from a bed to a chair (including a wheelchair)?: A Little Help needed standing up from a chair using your arms (e.g., wheelchair or bedside chair)?: A Little Help needed to walk in hospital room?: A Little Help needed climbing 3-5 steps with a railing? : A Lot 6 Click Score: 17    End of Session   Activity Tolerance: Patient tolerated treatment well Patient left: in chair;with call bell/phone within reach;with chair alarm set;with family/visitor present Nurse Communication: Mobility status PT Visit Diagnosis: Other abnormalities of gait and mobility (R26.89);History of falling (Z91.81)     Time: 1542-1600 PT Time Calculation (min) (ACUTE ONLY): 18 min  Charges:  $Gait Training: 8-22 mins                     Nathaniel Garcia, PT DPT Acute Rehabilitation Services Pager [phone]  Office [phone]    Nathaniel Garcia 09/02/2021, 4:21 PM

## 2021-09-02 NOTE — TOC Progression Note (Addendum)
Transition of Care [facility]) - Progression Note    Patient Details  Name: Nathaniel Garcia MRN: [id number] Date of Birth: 09/26/1946  [facility]) CM/SW Contact  Nathaniel Chars, LCSW Phone Number: 09/02/2021, 10:29 AM  Clinical Narrative:   CSW met with pt to discuss bed offers at [facility] and [facility].  Choice document provided.  Pt daughter Nathaniel Garcia on speaker phone along with pt ex wife Nathaniel Garcia.  CSW discussed offers, they are asking that CSW check with [facility] and [facility].  Pt confirms this.  Referrals sent to other [facility] facilities, [facility] specifically to ask them to review referrals.    CSW called daughter Nathaniel Garcia back, discussed limited network for [employer], neither [facility] or [facility] can take [employer].  Now also have offer from [facility].  CSW also messaged [facility].  Daughter lives in [place] and is communicating with her aunt in [place] about these facilities--will call back.    1215: [facility] makes bed offer.  They need pt to take covid booster shot, which pt was agreeable to.  They will initiate insurance auth but will need new PT note to do so.  Last PT note 10/14.  CSW spoke with rehab office, they will attempt to get pt seen today but may be tomorrow.  CSW confirmed with pt and daughter Nathaniel Garcia that they want to accept [facility] offer.      Expected Discharge Plan: Skilled Nursing Facility Barriers to Discharge: Ship broker, SNF Pending bed offer  Expected Discharge Plan and Services Expected Discharge Plan: [facility] arrangements for the past 2 months: Apartment                                       Social Determinants of Health (SDOH) Interventions    Readmission Risk Interventions No flowsheet data found.

## 2021-09-03 DIAGNOSIS — J441 Chronic obstructive pulmonary disease with (acute) exacerbation: Secondary | ICD-10-CM | POA: Diagnosis not present

## 2021-09-03 MED ORDER — MELATONIN 5 MG PO TABS
5.0000 mg | ORAL_TABLET | Freq: Once | ORAL | Status: AC | PRN
  Administered 2021-09-03: 5 mg via ORAL
  Filled 2021-09-03: qty 1

## 2021-09-03 NOTE — Progress Notes (Signed)
Pt refused bipap for the night.

## 2021-09-03 NOTE — Progress Notes (Signed)
Pt reports feeling short of breath, "difficult to get the air out". O2 sat reading 100% on room air, lung sounds are clear, and patient does not appear to be in any distress. Assisted with transfer to chair to help posture and will continue to monitor.

## 2021-09-03 NOTE — Progress Notes (Signed)
[facility]  PROGRESS NOTE  Nathaniel Garcia MGQ:[id number] DOB: 05-03-1946 DOA: 08/27/2021 PCP: Duanne Limerick, MD   Brief HPI:   74 year old male with history of COPD not on home oxygen, on chronic prednisone therapy 5 mg daily, history of stroke, hyperlipidemia came to ED with complaints of shortness of breath and wheezing.  In the ED patient received Solu-Medrol and albuterol and started on BiPAP overnight with improvement of symptoms.  Chest x-ray was normal with emphysematous changes.    Subjective   Patient seen and examined, denies shortness of breath.  Not requiring oxygen.   Assessment/Plan:     COPD exacerbation -Significantly improved after starting IV Solu-Medrol -Solu-Medrol dose was cut down to 60 mg IV every 12 hours due to agitation -IV Solu-Medrol was discontinued and patient started on prednisone 40 mg daily  -We will discharge him on prednisone taper  -Continue DuoNebs every 6 hours -Continue oxygen, keep O2 sats more than 92% -Continue montelukast  History of stroke -Continue Plavix, statin  Diarrhea -Resolved -He developed loose stools 2 days ago, currently resolved -Afebrile, no abdominal pain, no leukocytosis -Low likelihood for C. difficile  infection -We will monitor  BPH -Continue Flomax  Delirium -Patient became confused after starting IV Solu-Medrol -Required Haldol 5 mg x 2 doses yesterday -Delirium has resolved after dose of Solu-Medrol was discontinued  Hypertension -Continue propranolol     Scheduled medications:    atorvastatin  80 mg Oral Daily   clopidogrel  75 mg Oral Daily   enoxaparin (LOVENOX) injection  40 mg Subcutaneous Q24H   montelukast  10 mg Oral QHS   nicotine  21 mg Transdermal Daily   predniSONE  40 mg Oral Q breakfast   propranolol  10 mg Oral q morning   sertraline  25 mg Oral QHS   tamsulosin  0.4 mg Oral Daily     Data Reviewed:   CBG:  No results for input(s): GLUCAP in the last  168 hours.  SpO2: 98 % O2 Flow Rate (L/min): 2 L/min FiO2 (%): 40 %    Vitals:   09/02/21 1400 09/02/21 2000 09/03/21 0500 09/03/21 1410  BP:  124/63 127/67 134/66  Pulse: 100 76 66 71  Resp:  16 16 16   Temp:  98.3 F (36.8 C) 97.7 F (36.5 C) 97.7 F (36.5 C)  TempSrc:  Oral Oral   SpO2:  99% 100% 98%  Weight:      Height:        No intake or output data in the 24 hours ending 09/03/21 1623   10/16 1901 - 10/18 0700 In: 480 [P.O.:480] Out: -   Filed Weights   08/27/21 0235  Weight: 60.8 kg    Data Reviewed: Basic Metabolic Panel: Recent Labs  Lab 08/28/21 0240 08/29/21 0244 08/30/21 0433  NA 136 135 137  K 4.7 4.9 4.0  CL 103 102 102  CO2 27 21* 28  GLUCOSE 140* 116* 85  BUN 15 20 22   CREATININE 1.02 1.17 1.12  CALCIUM 8.8* 8.9 8.7*  MG 2.0  --   --    Liver Function Tests: Recent Labs  Lab 08/28/21 0240  AST 15  ALT 17  ALKPHOS 45  BILITOT 0.6  PROT 5.8*  ALBUMIN 2.9*   No results for input(s): LIPASE, AMYLASE in the last 168 hours. No results for input(s): AMMONIA in the last 168 hours. CBC: Recent Labs  Lab 08/28/21 0240 08/29/21 0244 08/30/21 0433  WBC 10.1 12.4* 8.5  NEUTROABS 9.0*  --   --   HGB 12.2* 13.7 12.8*  HCT 37.8* 41.1 38.5*  MCV 103.3* 102.8* 102.7*  PLT 188 195 163   Cardiac Enzymes: No results for input(s): CKTOTAL, CKMB, CKMBINDEX, TROPONINI in the last 168 hours. BNP (last 3 results) Recent Labs    10/29/20 2052 08/27/21 0230  BNP 84.2 38.8    ProBNP (last 3 results) No results for input(s): PROBNP in the last 8760 hours.  CBG: No results for input(s): GLUCAP in the last 168 hours.     Radiology Reports  No results found.     Antibiotics: Anti-infectives (From admission, onward)    None         DVT prophylaxis: Lovenox  Code Status: Full code  Family Communication: No family at bedside   Consultants:   Procedures:     Objective    Physical  Examination:  General-appears in no acute distress Heart-S1-S2, regular, no murmur auscultated Lungs-clear to auscultation bilaterally, no wheezing or crackles auscultated Abdomen-soft, nontender, no organomegaly Extremities-no edema in the lower extremities Neuro-alert, oriented x3, no focal deficit noted  Status is: Inpatient  Dispo: The patient is from: Home              Anticipated d/c is to: Skilled nursing facility              Anticipated d/c date is: 09/04/2021              Patient currently stable for discharge  Barrier to discharge-awaiting bed at skilled nursing facility  COVID-19 Labs  No results for input(s): DDIMER, FERRITIN, LDH, CRP in the last 72 hours.   Lab Results  Component Value Date   SARSCOV2NAA NEGATIVE 08/27/2021   SARSCOV2NAA NEGATIVE 10/29/2020   SARSCOV2NAA Not Detected 10/02/2020            Recent Results (from the past 240 hour(s))  Resp Panel by RT-PCR (Flu A&B, Covid) Nasopharyngeal Swab     Status: None   Collection Time: 08/27/21  3:03 AM   Specimen: Nasopharyngeal Swab; Nasopharyngeal(NP) swabs in vial transport medium  Result Value Ref Range Status   SARS Coronavirus 2 by RT PCR NEGATIVE NEGATIVE Final    Comment: (NOTE) SARS-CoV-2 target nucleic acids are NOT DETECTED.  The SARS-CoV-2 RNA is generally detectable in upper respiratory specimens during the acute phase of infection. The lowest concentration of SARS-CoV-2 viral copies this assay can detect is 138 copies/mL. A negative result does not preclude SARS-Cov-2 infection and should not be used as the sole basis for treatment or other patient management decisions. A negative result may occur with  improper specimen collection/handling, submission of specimen other than nasopharyngeal swab, presence of viral mutation(s) within the areas targeted by this assay, and inadequate number of viral copies(<138 copies/mL). A negative result must be combined with clinical  observations, patient history, and epidemiological information. The expected result is Negative.  Fact Sheet for Patients:  BloggerCourse.com  Fact Sheet for Healthcare Providers:  SeriousBroker.it  This test is no t yet approved or cleared by the [place] FDA and  has been authorized for detection and/or diagnosis of SARS-CoV-2 by FDA under an Emergency Use Authorization (EUA). This EUA will remain  in effect (meaning this test can be used) for the duration of the COVID-19 declaration under Section 564(b)(1) of the Act, 21 U.S.C.section 360bbb-3(b)(1), unless the authorization is terminated  or revoked sooner.       Influenza A by PCR NEGATIVE NEGATIVE Final  Influenza B by PCR NEGATIVE NEGATIVE Final    Comment: (NOTE) The Xpert Xpress SARS-CoV-2/FLU/RSV plus assay is intended as an aid in the diagnosis of influenza from Nasopharyngeal swab specimens and should not be used as a sole basis for treatment. Nasal washings and aspirates are unacceptable for Xpert Xpress SARS-CoV-2/FLU/RSV testing.  Fact Sheet for Patients: BloggerCourse.com  Fact Sheet for Healthcare Providers: SeriousBroker.it  This test is not yet approved or cleared by the [place] FDA and has been authorized for detection and/or diagnosis of SARS-CoV-2 by FDA under an Emergency Use Authorization (EUA). This EUA will remain in effect (meaning this test can be used) for the duration of the COVID-19 declaration under Section 564(b)(1) of the Act, 21 U.S.C. section 360bbb-3(b)(1), unless the authorization is terminated or revoked.  Performed at [facility], [address], [place], [place] [address]     Meredeth Ide   [facility] If 7PM-7AM, please contact night-coverage at [email or web address], Office  [phone]   09/03/2021, 4:23 PM  LOS: 7 days

## 2021-09-04 DIAGNOSIS — R531 Weakness: Secondary | ICD-10-CM | POA: Diagnosis not present

## 2021-09-04 DIAGNOSIS — J439 Emphysema, unspecified: Secondary | ICD-10-CM | POA: Diagnosis not present

## 2021-09-04 DIAGNOSIS — J441 Chronic obstructive pulmonary disease with (acute) exacerbation: Secondary | ICD-10-CM | POA: Diagnosis not present

## 2021-09-04 MED ORDER — NICOTINE 14 MG/24HR TD PT24: 14.0000 mg | MEDICATED_PATCH | Freq: Every day | TRANSDERMAL | 0 refills | Status: DC

## 2021-09-04 MED ORDER — BACITRACIN-NEOMYCIN-POLYMYXIN OINTMENT TUBE
TOPICAL_OINTMENT | CUTANEOUS | Status: DC | PRN
  Administered 2021-09-04: 1 via TOPICAL
  Filled 2021-09-04: qty 1
  Filled 2021-09-04: qty 14

## 2021-09-04 NOTE — Plan of Care (Signed)
  Problem: Health Behavior/Discharge Planning: Goal: Ability to manage health-related needs will improve Outcome: Adequate for Discharge   

## 2021-09-04 NOTE — Progress Notes (Signed)
Physical Therapy Treatment Patient Details Name: Nathaniel Garcia MRN: [id number] DOB: Feb 20, 1946 Today's Date: 09/04/2021   History of Present Illness 75 yo admitted 10/11 with SOB and COPD exacerbation with AMS after SoluMedrol. PMHx: COPD, CVA, HLD    PT Comments    Pt tolerates treatment well, ambulating for household distances with use of walker. Pt demonstrates good awareness, utilizing walker for all out of bed mobility, even with short distance of ambulation to bathroom. Pt negotiates stairs without assistance. Pt with difficulty performing dual-task cognitive assessment subtracting by 3s, but demonstrates good ability to name animals letters A-F without change in gait quality or increased time for cognitive task. Pt will benefit from continued acute PT services to aide in a return to independence. PT recommends discharge home with HHPT.  Recommendations for follow up therapy are one component of a multi-disciplinary discharge planning process, led by the attending physician.  Recommendations may be updated based on patient status, additional functional criteria and insurance authorization.  Follow Up Recommendations  Home health PT;Supervision - Intermittent     Equipment Recommendations  Rolling walker with 5" wheels    Recommendations for Other Services       Precautions / Restrictions Precautions Precautions: Fall Restrictions Weight Bearing Restrictions: No     Mobility  Bed Mobility Overal bed mobility: Independent                  Transfers Overall transfer level: Independent                  Ambulation/Gait Ambulation/Gait assistance: Modified independent (Device/Increase time) Gait Distance (Feet): 150 Feet Assistive device: Rolling walker (2 wheeled) Gait Pattern/deviations: Step-through pattern Gait velocity: functional Gait velocity interpretation: 1.31 - 2.62 ft/sec, indicative of limited community ambulator General Gait Details:  steady step-through gait with RW, pt performing dual-task cognitive tasks without change in gait speed however with errors when subtracting by 3's, able to name animals for letters A-F quickly   Stairs Stairs: Yes Stairs assistance: Supervision Stair Management: Two rails;Alternating pattern;Forwards Number of Stairs: 15     Wheelchair Mobility    Modified Rankin (Stroke Patients Only)       Balance Overall balance assessment: Needs assistance Sitting-balance support: Feet supported;No upper extremity supported Sitting balance-Leahy Scale: Good     Standing balance support: No upper extremity supported;During functional activity Standing balance-Leahy Scale: Good                              Cognition Arousal/Alertness: Awake/alert Behavior During Therapy: WFL for tasks assessed/performed Overall Cognitive Status: Within Functional Limits for tasks assessed                                        Exercises      General Comments General comments (skin integrity, edema, etc.): pt requiring 2 seated rest breaks during session, desats to 88% at time of 2nd break however recovers quickly with cues for pursed lip breathing      Pertinent Vitals/Pain Pain Assessment: No/denies pain    Home Living                      Prior Function            PT Goals (current goals can now be found in the care plan section) Acute Rehab  PT Goals Patient Stated Goal: return home Progress towards PT goals: Progressing toward goals    Frequency    Min 3X/week      PT Plan Current plan remains appropriate    Co-evaluation              AM-PAC PT "6 Clicks" Mobility   Outcome Measure  Help needed turning from your back to your side while in a flat bed without using bedrails?: None Help needed moving from lying on your back to sitting on the side of a flat bed without using bedrails?: None Help needed moving to and from a bed to a  chair (including a wheelchair)?: None Help needed standing up from a chair using your arms (e.g., wheelchair or bedside chair)?: None Help needed to walk in hospital room?: None Help needed climbing 3-5 steps with a railing? : A Little 6 Click Score: 23    End of Session   Activity Tolerance: Patient tolerated treatment well Patient left: in bed;with call bell/phone within reach Nurse Communication: Mobility status PT Visit Diagnosis: Other abnormalities of gait and mobility (R26.89);History of falling (Z91.81)     Time: 5320-2334 PT Time Calculation (min) (ACUTE ONLY): 23 min  Charges:  $Gait Training: 8-22 mins $Therapeutic Activity: 8-22 mins                     Arlyss Gandy, PT, DPT Acute Rehabilitation Pager: [phone] Office [phone]    Arlyss Gandy 09/04/2021, 11:05 AM

## 2021-09-04 NOTE — TOC Transition Note (Signed)
Transition of Care [facility]) - CM/SW Discharge Note   Patient Details  Name: Marrio Scribner MRN: [id number] Date of Birth: Feb 13, 1946  Transition of Care [facility]) CM/SW Contact:  Lorri Jamarious, LCSW Phone Number: 09/04/2021, 1:39 PM   Clinical Narrative:   Pt discharging home with [facility].  [employer] provides rollator.  Pt Brother will transport him home.  No other needs identified.      Final next level of care: Home w Home Health Services Barriers to Discharge: Barriers Resolved   Patient Goals and CMS Choice   CMS Medicare.gov Compare Post Acute Care list provided to:: Patient Choice offered to / list presented to : Patient  Discharge Placement                  Name of family member notified: daughter Donnald Garre Patient and family notified of of transfer: 09/04/21  Discharge Plan and Services                DME Arranged: Dan Humphreys rolling with seat DME Agency: [employer] Date DME Agency Contacted: 09/04/21 Time DME Agency Contacted: 1247 Representative spoke with at DME Agency: Velna Hatchet HH Arranged: PT, Nurse's Aide HH Agency: [facility] Date [facility] Agency Contacted: 09/04/21 Time HH Agency Contacted: 1339 Representative spoke with at [facility] Agency: Angie  Social Determinants of Health (SDOH) Interventions     Readmission Risk Interventions No flowsheet data found.

## 2021-09-04 NOTE — Discharge Summary (Signed)
Physician Discharge Summary  Nathaniel Garcia RFF:[id number] DOB: 1946-07-18 DOA: 08/27/2021  PCP: Duanne Limerick, MD  Admit date: 08/27/2021 Discharge date: 09/04/2021  Admitted From: home Discharge disposition: home   Recommendations for Outpatient Follow-Up:   Smoking cessation Home health PT/aide  Discharge Diagnosis:   Principal Problem:   COPD exacerbation (HCC) Active Problems:   COPD with acute exacerbation (HCC)    Discharge Condition: Improved.  Diet recommendation: Low sodium, heart healthy.  Wound care: None.  Code status: Full.   History of Present Illness:   Nathaniel Garcia is a 75 y.o. male with medical history significant of COPD not on home oxygen but on maintenance prednisone 5 mg daily, history of stroke, hyperlipidemia presented to the emergency room with 1 day of shortness of breath and wheezing.  Patient reports good symptom control at home with his nebulizers before this attack.  No aggravating factors.  Patient tells me that few hours ago he started feeling short of breath and very tight chest, used his nebulizer at home that did not help so called EMS with respiratory distress.  EMS had given him a dose of his steroids and nebulizer on the way to the ER.  No fever chills.  Denies any cough congestion or sick contacts.  Chest tightness present but denies any chest pain.  Denies any nausea vomiting abdominal pain.  Denies any leg swelling, orthopnea.  Denies any change in bowel or bladder habits.   Hospital Course by Problem:   COPD exacerbation -Significantly improved after starting IV Solu-Medrol -Solu-Medrol dose was cut down to 60 mg IV every 12 hours due to agitation -IV Solu-Medrol was discontinued and patient started on prednisone 40 mg daily x 5 days -resume home prednisone -off O2   History of stroke -Continue Plavix, statin   Diarrhea -Resolved   BPH -Continue Flomax   Delirium -Patient became confused  after starting IV Solu-Medrol -resolved- appropriate    Hypertension -Continue propranolol          Medical Consultants:   none   Discharge Exam:   Vitals:   09/03/21 2000 09/04/21 0500  BP: 122/70 124/67  Pulse: 64 60  Resp: 20 18  Temp: 98.4 F (36.9 C) 98.1 F (36.7 C)  SpO2: 99% 98%   Vitals:   09/03/21 0500 09/03/21 1410 09/03/21 2000 09/04/21 0500  BP: 127/67 134/66 122/70 124/67  Pulse: 66 71 64 60  Resp: 16 16 20 18   Temp: 97.7 F (36.5 C) 97.7 F (36.5 C) 98.4 F (36.9 C) 98.1 F (36.7 C)  TempSrc: Oral  Oral Oral  SpO2: 100% 98% 99% 98%  Weight:      Height:        General exam: Appears calm and comfortable.   The results of significant diagnostics from this hospitalization (including imaging, microbiology, ancillary and laboratory) are listed below for reference.     Procedures and Diagnostic Studies:   DG Chest Portable 1 View  Result Date: 08/27/2021 CLINICAL DATA:  Dyspnea.  COPD, emphysema. EXAM: PORTABLE CHEST 1 VIEW COMPARISON:  08/05/2021 FINDINGS: The lungs are symmetrically hyperinflated in keeping with changes of underlying COPD, similar to prior examination. No superimposed focal pulmonary infiltrate. Benign calcified granuloma within the left mid lung zone. No pneumothorax or pleural effusion. Cardiac size within normal limits. Pulmonary vascularity is normal. No acute bone abnormality. IMPRESSION: No active disease.  COPD. Electronically Signed   By: 08/07/2021 M.D.   On: 08/27/2021 03:27  Labs:   Basic Metabolic Panel: Recent Labs  Lab 08/29/21 0244 08/30/21 0433  NA 135 137  K 4.9 4.0  CL 102 102  CO2 21* 28  GLUCOSE 116* 85  BUN 20 22  CREATININE 1.17 1.12  CALCIUM 8.9 8.7*   GFR Estimated Creatinine Clearance: 48.3 mL/min (by C-G formula based on SCr of 1.12 mg/dL). Liver Function Tests: No results for input(s): AST, ALT, ALKPHOS, BILITOT, PROT, ALBUMIN in the last 168 hours. No results for input(s):  LIPASE, AMYLASE in the last 168 hours. No results for input(s): AMMONIA in the last 168 hours. Coagulation profile No results for input(s): INR, PROTIME in the last 168 hours.  CBC: Recent Labs  Lab 08/29/21 0244 08/30/21 0433  WBC 12.4* 8.5  HGB 13.7 12.8*  HCT 41.1 38.5*  MCV 102.8* 102.7*  PLT 195 163   Cardiac Enzymes: No results for input(s): CKTOTAL, CKMB, CKMBINDEX, TROPONINI in the last 168 hours. BNP: Invalid input(s): POCBNP CBG: No results for input(s): GLUCAP in the last 168 hours. D-Dimer No results for input(s): DDIMER in the last 72 hours. Hgb A1c No results for input(s): HGBA1C in the last 72 hours. Lipid Profile No results for input(s): CHOL, HDL, LDLCALC, TRIG, CHOLHDL, LDLDIRECT in the last 72 hours. Thyroid function studies No results for input(s): TSH, T4TOTAL, T3FREE, THYROIDAB in the last 72 hours.  Invalid input(s): FREET3 Anemia work up No results for input(s): VITAMINB12, FOLATE, FERRITIN, TIBC, IRON, RETICCTPCT in the last 72 hours. Microbiology Recent Results (from the past 240 hour(s))  Resp Panel by RT-PCR (Flu A&B, Covid) Nasopharyngeal Swab     Status: None   Collection Time: 08/27/21  3:03 AM   Specimen: Nasopharyngeal Swab; Nasopharyngeal(NP) swabs in vial transport medium  Result Value Ref Range Status   SARS Coronavirus 2 by RT PCR NEGATIVE NEGATIVE Final    Comment: (NOTE) SARS-CoV-2 target nucleic acids are NOT DETECTED.  The SARS-CoV-2 RNA is generally detectable in upper respiratory specimens during the acute phase of infection. The lowest concentration of SARS-CoV-2 viral copies this assay can detect is 138 copies/mL. A negative result does not preclude SARS-Cov-2 infection and should not be used as the sole basis for treatment or other patient management decisions. A negative result may occur with  improper specimen collection/handling, submission of specimen other than nasopharyngeal swab, presence of viral mutation(s)  within the areas targeted by this assay, and inadequate number of viral copies(<138 copies/mL). A negative result must be combined with clinical observations, patient history, and epidemiological information. The expected result is Negative.  Fact Sheet for Patients:  BloggerCourse.com  Fact Sheet for Healthcare Providers:  SeriousBroker.it  This test is no t yet approved or cleared by the [place] FDA and  has been authorized for detection and/or diagnosis of SARS-CoV-2 by FDA under an Emergency Use Authorization (EUA). This EUA will remain  in effect (meaning this test can be used) for the duration of the COVID-19 declaration under Section 564(b)(1) of the Act, 21 U.S.C.section 360bbb-3(b)(1), unless the authorization is terminated  or revoked sooner.       Influenza A by PCR NEGATIVE NEGATIVE Final   Influenza B by PCR NEGATIVE NEGATIVE Final    Comment: (NOTE) The Xpert Xpress SARS-CoV-2/FLU/RSV plus assay is intended as an aid in the diagnosis of influenza from Nasopharyngeal swab specimens and should not be used as a sole basis for treatment. Nasal washings and aspirates are unacceptable for Xpert Xpress SARS-CoV-2/FLU/RSV testing.  Fact Sheet for Patients: BloggerCourse.com  Fact Sheet for Healthcare Providers: SeriousBroker.it  This test is not yet approved or cleared by the [place] FDA and has been authorized for detection and/or diagnosis of SARS-CoV-2 by FDA under an Emergency Use Authorization (EUA). This EUA will remain in effect (meaning this test can be used) for the duration of the COVID-19 declaration under Section 564(b)(1) of the Act, 21 U.S.C. section 360bbb-3(b)(1), unless the authorization is terminated or revoked.  Performed at [facility], [address], [place], [place] [address]      Discharge Instructions:   Discharge  Instructions     Diet - low sodium heart healthy   Complete by: As directed    Increase activity slowly   Complete by: As directed       Allergies as of 09/04/2021       Reactions   Hctz [hydrochlorothiazide] Rash        Medication List     TAKE these medications    albuterol 108 (90 Base) MCG/ACT inhaler Commonly known as: VENTOLIN HFA Inhale 2 puffs into the lungs every 6 (six) hours as needed for wheezing or shortness of breath.   atorvastatin 80 MG tablet Commonly known as: LIPITOR Take 1 tablet (80 mg total) by mouth daily.   clopidogrel 75 MG tablet Commonly known as: PLAVIX TAKE 1 TABLET(75 MG) BY MOUTH DAILY What changed:  how much to take how to take this when to take this additional instructions   montelukast 10 MG tablet Commonly known as: SINGULAIR Take 1 tablet (10 mg total) by mouth at bedtime.   nicotine 14 mg/24hr patch Commonly known as: NICODERM CQ - dosed in mg/24 hours Place 1 patch (14 mg total) onto the skin daily. Start taking on: September 05, 2021   predniSONE 5 MG tablet Commonly known as: DELTASONE Take 5 mg by mouth daily.   propranolol 10 MG tablet Commonly known as: INDERAL Take 1 tablet (10 mg total) by mouth every morning.   sertraline 25 MG tablet Commonly known as: ZOLOFT Take 1 tablet (25 mg total) by mouth at bedtime.   tamsulosin 0.4 MG Caps capsule Commonly known as: FLOMAX TAKE 1 CAPSULE(0.4 MG) BY MOUTH DAILY What changed:  how much to take how to take this when to take this additional instructions               Durable Medical Equipment  (From admission, onward)           Start     Ordered   09/04/21 1123  For home use only DME 4 wheeled rolling walker with seat  Once       Question Answer Comment  Patient needs a walker to treat with the following condition Weakness   Patient needs a walker to treat with the following condition COPD (chronic obstructive pulmonary disease) (HCC)       09/04/21 1122            Follow-up Information     Duanne Limerick, MD Follow up in 1 week(s).   Specialty: Family Medicine Contact information: [address] [place] [address] [phone]                  Time coordinating discharge: 35 min  Signed:  Joseph Art DO  [facility] 09/04/2021, 11:27 AM

## 2021-09-05 ENCOUNTER — Telehealth: Payer: Self-pay

## 2021-09-05 ENCOUNTER — Telehealth: Payer: Self-pay | Admitting: Family Medicine

## 2021-09-05 NOTE — Telephone Encounter (Signed)
Home Health Verbal Orders - Caller/Agency: Marcelino Duster with [facility]  Callback Number: [id number]  They need approval for starting home health with patient before Monday..   The referral was from [facility].  This will be for PT

## 2021-09-05 NOTE — Telephone Encounter (Signed)
Tried to call [facility]- they are closed today. Will try again in the morning

## 2021-09-05 NOTE — Telephone Encounter (Signed)
Transition Care Management Follow-up Telephone Call Date of discharge and from where: 09/04/21 [facility] How have you been since you were released from the hospital? Pt states he is doing okay; feeling better Any questions or concerns? No  Items Reviewed: Did the pt receive and understand the discharge instructions provided? Yes  Medications obtained and verified? Yes  Other? No  Any new allergies since your discharge? No  Dietary orders reviewed? Yes Do you have support at home? Yes   Home Care and Equipment/Supplies: Were home health services ordered? yes If so, what is the name of the agency? [facility] Has the agency set up a time to come to the patient's home? yes Were any new equipment or medical supplies ordered?  Yes - walker/rollator What is the name of the medical supply agency? [employer] Were you able to get the supplies/equipment? yes Do you have any questions related to the use of the equipment or supplies? No  Functional Questionnaire: (I = Independent and D = Dependent) ADLs: I   Bathing/Dressing- D  Meal Prep- I  Eating- I  Maintaining continence- I  Transferring/Ambulation- I with assistance  Managing Meds- I  Follow up appointments reviewed:  PCP Hospital f/u appt confirmed? Yes  Scheduled to see Dr. Yetta Barre on 09/12/21 @ 10:40. Specialist Hospital f/u appt confirmed? No   Are transportation arrangements needed? No  If their condition worsens, is the pt aware to call PCP or go to the Emergency Dept.? Yes Was the patient provided with contact information for the PCP's office or ED? Yes Was to pt encouraged to call back with questions or concerns? Yes

## 2021-09-09 DIAGNOSIS — R001 Bradycardia, unspecified: Secondary | ICD-10-CM | POA: Diagnosis not present

## 2021-09-09 DIAGNOSIS — D649 Anemia, unspecified: Secondary | ICD-10-CM | POA: Diagnosis not present

## 2021-09-09 DIAGNOSIS — J441 Chronic obstructive pulmonary disease with (acute) exacerbation: Secondary | ICD-10-CM | POA: Diagnosis not present

## 2021-09-09 DIAGNOSIS — I700 Atherosclerosis of aorta: Secondary | ICD-10-CM | POA: Diagnosis not present

## 2021-09-09 DIAGNOSIS — J432 Centrilobular emphysema: Secondary | ICD-10-CM | POA: Diagnosis not present

## 2021-09-09 DIAGNOSIS — J9621 Acute and chronic respiratory failure with hypoxia: Secondary | ICD-10-CM | POA: Diagnosis not present

## 2021-09-09 DIAGNOSIS — J9601 Acute respiratory failure with hypoxia: Secondary | ICD-10-CM | POA: Diagnosis not present

## 2021-09-09 DIAGNOSIS — R350 Frequency of micturition: Secondary | ICD-10-CM | POA: Diagnosis not present

## 2021-09-09 DIAGNOSIS — G250 Essential tremor: Secondary | ICD-10-CM | POA: Diagnosis not present

## 2021-09-09 DIAGNOSIS — Z8673 Personal history of transient ischemic attack (TIA), and cerebral infarction without residual deficits: Secondary | ICD-10-CM | POA: Diagnosis not present

## 2021-09-09 DIAGNOSIS — N401 Enlarged prostate with lower urinary tract symptoms: Secondary | ICD-10-CM | POA: Diagnosis not present

## 2021-09-09 DIAGNOSIS — N400 Enlarged prostate without lower urinary tract symptoms: Secondary | ICD-10-CM | POA: Diagnosis not present

## 2021-09-09 DIAGNOSIS — I6523 Occlusion and stenosis of bilateral carotid arteries: Secondary | ICD-10-CM | POA: Diagnosis not present

## 2021-09-09 DIAGNOSIS — E782 Mixed hyperlipidemia: Secondary | ICD-10-CM | POA: Diagnosis not present

## 2021-09-09 DIAGNOSIS — R69 Illness, unspecified: Secondary | ICD-10-CM | POA: Diagnosis not present

## 2021-09-12 ENCOUNTER — Encounter: Payer: Self-pay | Admitting: Family Medicine

## 2021-09-12 ENCOUNTER — Other Ambulatory Visit: Payer: Self-pay

## 2021-09-12 ENCOUNTER — Ambulatory Visit (INDEPENDENT_AMBULATORY_CARE_PROVIDER_SITE_OTHER): Payer: Medicare HMO | Admitting: Family Medicine

## 2021-09-12 VITALS — BP 114/62 | HR 71 | Ht 73.0 in | Wt 126.9 lb

## 2021-09-12 DIAGNOSIS — Z09 Encounter for follow-up examination after completed treatment for conditions other than malignant neoplasm: Secondary | ICD-10-CM

## 2021-09-12 DIAGNOSIS — S300XXA Contusion of lower back and pelvis, initial encounter: Secondary | ICD-10-CM | POA: Diagnosis not present

## 2021-09-12 NOTE — Progress Notes (Signed)
Date:  09/12/2021   Name:  Nathaniel Garcia   DOB:  1946/08/15   MRN:  [id number]   Chief Complaint: Hospitalization Follow-up Larey Seat in the hospital and hit middle buttocks hurts.)  HPI  Lab Results  Component Value Date   CREATININE 1.12 08/30/2021   BUN 22 08/30/2021   NA 137 08/30/2021   K 4.0 08/30/2021   CL 102 08/30/2021   CO2 28 08/30/2021   Lab Results  Component Value Date   CHOL 177 06/29/2018   HDL 54 06/29/2018   LDLCALC 110 (H) 06/29/2018   TRIG 64 06/29/2018   CHOLHDL 3.3 06/29/2018   Lab Results  Component Value Date   TSH 1.650 05/30/2019   No results found for: HGBA1C Lab Results  Component Value Date   WBC 8.5 08/30/2021   HGB 12.8 (L) 08/30/2021   HCT 38.5 (L) 08/30/2021   MCV 102.7 (H) 08/30/2021   PLT 163 08/30/2021   Lab Results  Component Value Date   ALT 17 08/28/2021   AST 15 08/28/2021   ALKPHOS 45 08/28/2021   BILITOT 0.6 08/28/2021     Review of Systems  Constitutional:  Negative for chills and fever.  HENT:  Negative for drooling, ear discharge, ear pain and sore throat.   Respiratory:  Negative for cough, shortness of breath and wheezing.   Cardiovascular:  Negative for chest pain, palpitations and leg swelling.  Gastrointestinal:  Negative for abdominal pain, blood in stool, constipation, diarrhea and nausea.  Endocrine: Negative for polydipsia.  Genitourinary:  Negative for dysuria, frequency, hematuria and urgency.  Musculoskeletal:  Positive for back pain. Negative for myalgias and neck pain.       Coccyx pain  Skin:  Negative for rash.  Allergic/Immunologic: Negative for environmental allergies.  Neurological:  Negative for dizziness and headaches.  Hematological:  Does not bruise/bleed easily.  Psychiatric/Behavioral:  Negative for suicidal ideas. The patient is not nervous/anxious.    Patient Active Problem List   Diagnosis Date Noted   COPD with acute exacerbation (HCC) 01/03/2021   Bullous emphysema  (HCC) 01/03/2021   Acute exacerbation of chronic obstructive pulmonary disease (COPD) (HCC) 10/31/2020   COPD exacerbation (HCC) 10/29/2020   Acute respiratory failure with hypoxia (HCC) 10/29/2020   History of stroke 10/29/2020   Aortic atherosclerosis (HCC) 12/30/2018   Benign hematuria 09/18/2016   Weakness of left hand 07/02/2016   Lipid screening 01/03/2016   Cerebral vascular disease 09/14/2015   Carotid artery narrowing 08/15/2015   Bilateral carotid artery stenosis 08/15/2015   Bradycardia 08/01/2015   H/O transient cerebral ischemia 08/01/2015   Combined fat and carbohydrate induced hyperlipemia 08/01/2015   Breathlessness on exertion 07/30/2015   Absolute anemia 04/03/2015   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   Cerebral vascular accident (HCC) 04/03/2015   HLD (hyperlipidemia) 04/03/2015   Blood glucose elevated 04/03/2015   Lung nodule, solitary 04/03/2015   Non compliance with medical treatment 04/03/2015   Lung nodule, multiple 04/03/2015   Cerebral infarction (HCC) 04/03/2015   Abnormal lung field 04/03/2015    Allergies  Allergen Reactions   Hctz [Hydrochlorothiazide] Rash    Past Surgical History:  Procedure Laterality Date   COLONOSCOPY  2011   normal- [facility] docs    Social History   Tobacco Use   Smoking status: Former    Packs/day: 0.02    Years: 60.00    Pack years: 1.20    Types: Cigarettes    Quit date: 10/26/2020    Years since  quitting: 0.8   Smokeless tobacco: Never   Tobacco comments:    [place] smoking cessation information provided  Vaping Use   Vaping Use: Never used  Substance Use Topics   Alcohol use: Yes    Alcohol/week: 2.0 standard drinks    Types: 2 Cans of beer per week    Comment: weekly   Drug use: Not Currently    Comment: marijuana     Medication list has been reviewed and updated.  Current Meds  Medication Sig   albuterol (VENTOLIN HFA) 108 (90 Base) MCG/ACT inhaler Inhale 2 puffs into the lungs every 6  (six) hours as needed for wheezing or shortness of breath.   atorvastatin (LIPITOR) 80 MG tablet Take 1 tablet (80 mg total) by mouth daily.   clopidogrel (PLAVIX) 75 MG tablet TAKE 1 TABLET(75 MG) BY MOUTH DAILY (Patient taking differently: Take 75 mg by mouth daily.)   montelukast (SINGULAIR) 10 MG tablet Take 1 tablet (10 mg total) by mouth at bedtime.   nicotine (NICODERM CQ - DOSED IN MG/24 HOURS) 14 mg/24hr patch Place 1 patch (14 mg total) onto the skin daily.   predniSONE (DELTASONE) 5 MG tablet Take 5 mg by mouth daily.   propranolol (INDERAL) 10 MG tablet Take 1 tablet (10 mg total) by mouth every morning.   sertraline (ZOLOFT) 25 MG tablet Take 1 tablet (25 mg total) by mouth at bedtime.   tamsulosin (FLOMAX) 0.4 MG CAPS capsule TAKE 1 CAPSULE(0.4 MG) BY MOUTH DAILY (Patient taking differently: Take 0.4 mg by mouth daily.)    PHQ 2/9 Scores 08/01/2021 07/12/2021 07/01/2021 03/15/2021  PHQ - 2 Score 0 0 0 0  PHQ- 9 Score 0 2 - 0    GAD 7 : Generalized Anxiety Score 08/01/2021 07/12/2021 07/12/2021 03/15/2021  Nervous, Anxious, on Edge 0 1 0 0  Control/stop worrying 0 0 0 0  Worry too much - different things 0 0 0 0  Trouble relaxing 0 1 0 0  Restless 0 1 0 0  Easily annoyed or irritable 0 0 0 0  Afraid - awful might happen 0 0 0 0  Total GAD 7 Score 0 3 0 0  Anxiety Difficulty - Somewhat difficult - -    BP Readings from Last 3 Encounters:  09/12/21 114/62  09/04/21 107/61  08/12/21 (!) 159/61    Physical Exam Vitals and nursing note reviewed.  HENT:     Head: Normocephalic.     Right Ear: Tympanic membrane, ear canal and external ear normal.     Left Ear: Tympanic membrane, ear canal and external ear normal.     Nose: Nose normal. No congestion or rhinorrhea.  Eyes:     General: No scleral icterus.       Right eye: No discharge.        Left eye: No discharge.     Conjunctiva/sclera: Conjunctivae normal.     Pupils: Pupils are equal, round, and reactive to light.   Neck:     Thyroid: No thyromegaly.     Vascular: No JVD.     Trachea: No tracheal deviation.  Cardiovascular:     Rate and Rhythm: Normal rate and regular rhythm.     Pulses: Normal pulses.     Heart sounds: Normal heart sounds, S1 normal and S2 normal. No murmur heard. No systolic murmur is present.  No diastolic murmur is present.    No friction rub. No gallop. No S3 or S4 sounds.  Pulmonary:  Effort: No respiratory distress.     Breath sounds: Normal breath sounds. No wheezing, rhonchi or rales.  Abdominal:     General: Bowel sounds are normal.     Palpations: Abdomen is soft. There is no hepatomegaly, splenomegaly or mass.     Tenderness: There is no abdominal tenderness. There is no guarding or rebound.     Hernia: There is no hernia in the umbilical area or ventral area.  Musculoskeletal:        General: No tenderness. Normal range of motion.     Cervical back: Normal range of motion and neck supple.     Comments: Coccyx tender  Lymphadenopathy:     Cervical: No cervical adenopathy.  Skin:    General: Skin is warm.     Findings: No rash.  Neurological:     Mental Status: He is alert and oriented to person, place, and time.     Cranial Nerves: No cranial nerve deficit.     Deep Tendon Reflexes: Reflexes are normal and symmetric.    Wt Readings from Last 3 Encounters:  09/12/21 126 lb 14.4 oz (57.6 kg)  08/27/21 134 lb (60.8 kg)  08/12/21 134 lb (60.8 kg)    BP 114/62   Pulse 71   Ht 6\' 1"  (1.854 m)   Wt 126 lb 14.4 oz (57.6 kg)   SpO2 96%   BMI 16.74 kg/m   Assessment and Plan:  1. Hospital discharge follow-up Patient is hospital discharge follow-up for COPD exacerbation.  Patient was admitted on the 11th and discharged 8 days later.  Medications at discharge include albuterol, prednisone, review of chest x-ray from 10 11 June showing hyperinflation consistent with underlying COPD there was no focal pulmonary infiltrate.  The calcified granuloma in the mid  left lung zone was unchanged.  Patient has continued to do well since discharge.    2. Coccyx contusion, initial encounter New onset.  Persistent.  Relatively stable.  But does have pain at the coccyx area pending level of activity.  During hospitalization patient states that he had a fall while trying going to the bathroom when he became impatient that there was no response to his signal.  Patient said that he had to urinate like a "13 June race horse "and got entangled in some wires and fell on his buttock area patient has had some discomfort of the but area with tenderness of the coccyx.  I have explained to him the concept of coccydynia we do not really set these and that is going to take time to see what the resolution of pain is going to be.  Patient has been invited to return if pain continues

## 2021-09-13 ENCOUNTER — Other Ambulatory Visit: Payer: Self-pay | Admitting: Urology

## 2021-09-13 DIAGNOSIS — R3129 Other microscopic hematuria: Secondary | ICD-10-CM

## 2021-09-13 NOTE — Progress Notes (Signed)
Patient notified and did not want to schedule the follow up for CT results. He states he is going out of town. He states he will call back.

## 2021-09-13 NOTE — Progress Notes (Signed)
Mailbox is full.

## 2021-09-13 NOTE — Progress Notes (Signed)
Please let Mr. Norkus know that I have placed an order for his CT scan.  They should be calling him to get this scheduled.  We need to get him scheduled for a return appointment to go over the results.

## 2021-09-16 DIAGNOSIS — J432 Centrilobular emphysema: Secondary | ICD-10-CM | POA: Diagnosis not present

## 2021-09-16 DIAGNOSIS — D649 Anemia, unspecified: Secondary | ICD-10-CM | POA: Diagnosis not present

## 2021-09-16 DIAGNOSIS — R001 Bradycardia, unspecified: Secondary | ICD-10-CM | POA: Diagnosis not present

## 2021-09-16 DIAGNOSIS — J9601 Acute respiratory failure with hypoxia: Secondary | ICD-10-CM | POA: Diagnosis not present

## 2021-09-16 DIAGNOSIS — R69 Illness, unspecified: Secondary | ICD-10-CM | POA: Diagnosis not present

## 2021-09-16 DIAGNOSIS — E782 Mixed hyperlipidemia: Secondary | ICD-10-CM | POA: Diagnosis not present

## 2021-09-16 DIAGNOSIS — N400 Enlarged prostate without lower urinary tract symptoms: Secondary | ICD-10-CM | POA: Diagnosis not present

## 2021-09-16 DIAGNOSIS — I700 Atherosclerosis of aorta: Secondary | ICD-10-CM | POA: Diagnosis not present

## 2021-09-16 DIAGNOSIS — I6523 Occlusion and stenosis of bilateral carotid arteries: Secondary | ICD-10-CM | POA: Diagnosis not present

## 2021-09-16 DIAGNOSIS — Z8673 Personal history of transient ischemic attack (TIA), and cerebral infarction without residual deficits: Secondary | ICD-10-CM | POA: Diagnosis not present

## 2021-09-17 ENCOUNTER — Other Ambulatory Visit: Payer: Self-pay | Admitting: Family Medicine

## 2021-09-17 DIAGNOSIS — J9601 Acute respiratory failure with hypoxia: Secondary | ICD-10-CM | POA: Diagnosis not present

## 2021-09-17 DIAGNOSIS — N400 Enlarged prostate without lower urinary tract symptoms: Secondary | ICD-10-CM | POA: Diagnosis not present

## 2021-09-17 DIAGNOSIS — I6523 Occlusion and stenosis of bilateral carotid arteries: Secondary | ICD-10-CM | POA: Diagnosis not present

## 2021-09-17 DIAGNOSIS — J432 Centrilobular emphysema: Secondary | ICD-10-CM | POA: Diagnosis not present

## 2021-09-17 DIAGNOSIS — E782 Mixed hyperlipidemia: Secondary | ICD-10-CM | POA: Diagnosis not present

## 2021-09-17 DIAGNOSIS — R001 Bradycardia, unspecified: Secondary | ICD-10-CM | POA: Diagnosis not present

## 2021-09-17 DIAGNOSIS — D649 Anemia, unspecified: Secondary | ICD-10-CM | POA: Diagnosis not present

## 2021-09-17 DIAGNOSIS — R69 Illness, unspecified: Secondary | ICD-10-CM | POA: Diagnosis not present

## 2021-09-17 DIAGNOSIS — J209 Acute bronchitis, unspecified: Secondary | ICD-10-CM

## 2021-09-17 DIAGNOSIS — I700 Atherosclerosis of aorta: Secondary | ICD-10-CM | POA: Diagnosis not present

## 2021-09-17 DIAGNOSIS — Z8673 Personal history of transient ischemic attack (TIA), and cerebral infarction without residual deficits: Secondary | ICD-10-CM | POA: Diagnosis not present

## 2021-09-18 DIAGNOSIS — J449 Chronic obstructive pulmonary disease, unspecified: Secondary | ICD-10-CM | POA: Diagnosis not present

## 2021-09-18 DIAGNOSIS — R0609 Other forms of dyspnea: Secondary | ICD-10-CM | POA: Diagnosis not present

## 2021-09-18 DIAGNOSIS — R918 Other nonspecific abnormal finding of lung field: Secondary | ICD-10-CM | POA: Diagnosis not present

## 2021-09-20 DIAGNOSIS — I6523 Occlusion and stenosis of bilateral carotid arteries: Secondary | ICD-10-CM | POA: Diagnosis not present

## 2021-09-20 DIAGNOSIS — E782 Mixed hyperlipidemia: Secondary | ICD-10-CM | POA: Diagnosis not present

## 2021-09-20 DIAGNOSIS — R001 Bradycardia, unspecified: Secondary | ICD-10-CM | POA: Diagnosis not present

## 2021-09-20 DIAGNOSIS — Z8673 Personal history of transient ischemic attack (TIA), and cerebral infarction without residual deficits: Secondary | ICD-10-CM | POA: Diagnosis not present

## 2021-09-20 DIAGNOSIS — I700 Atherosclerosis of aorta: Secondary | ICD-10-CM | POA: Diagnosis not present

## 2021-09-20 DIAGNOSIS — J9601 Acute respiratory failure with hypoxia: Secondary | ICD-10-CM | POA: Diagnosis not present

## 2021-09-20 DIAGNOSIS — J432 Centrilobular emphysema: Secondary | ICD-10-CM | POA: Diagnosis not present

## 2021-09-20 DIAGNOSIS — D649 Anemia, unspecified: Secondary | ICD-10-CM | POA: Diagnosis not present

## 2021-09-20 DIAGNOSIS — R69 Illness, unspecified: Secondary | ICD-10-CM | POA: Diagnosis not present

## 2021-09-20 DIAGNOSIS — N400 Enlarged prostate without lower urinary tract symptoms: Secondary | ICD-10-CM | POA: Diagnosis not present

## 2021-09-25 DIAGNOSIS — Z8673 Personal history of transient ischemic attack (TIA), and cerebral infarction without residual deficits: Secondary | ICD-10-CM | POA: Diagnosis not present

## 2021-09-25 DIAGNOSIS — J441 Chronic obstructive pulmonary disease with (acute) exacerbation: Secondary | ICD-10-CM | POA: Diagnosis not present

## 2021-09-25 DIAGNOSIS — N400 Enlarged prostate without lower urinary tract symptoms: Secondary | ICD-10-CM | POA: Diagnosis not present

## 2021-09-25 DIAGNOSIS — J449 Chronic obstructive pulmonary disease, unspecified: Secondary | ICD-10-CM | POA: Diagnosis not present

## 2021-09-25 DIAGNOSIS — J9601 Acute respiratory failure with hypoxia: Secondary | ICD-10-CM | POA: Diagnosis not present

## 2021-09-25 DIAGNOSIS — E782 Mixed hyperlipidemia: Secondary | ICD-10-CM | POA: Diagnosis not present

## 2021-09-25 DIAGNOSIS — D649 Anemia, unspecified: Secondary | ICD-10-CM | POA: Diagnosis not present

## 2021-09-25 DIAGNOSIS — G4733 Obstructive sleep apnea (adult) (pediatric): Secondary | ICD-10-CM | POA: Diagnosis not present

## 2021-09-25 DIAGNOSIS — J432 Centrilobular emphysema: Secondary | ICD-10-CM | POA: Diagnosis not present

## 2021-09-25 DIAGNOSIS — R001 Bradycardia, unspecified: Secondary | ICD-10-CM | POA: Diagnosis not present

## 2021-09-25 DIAGNOSIS — J439 Emphysema, unspecified: Secondary | ICD-10-CM | POA: Diagnosis not present

## 2021-09-25 DIAGNOSIS — R69 Illness, unspecified: Secondary | ICD-10-CM | POA: Diagnosis not present

## 2021-09-25 DIAGNOSIS — I6523 Occlusion and stenosis of bilateral carotid arteries: Secondary | ICD-10-CM | POA: Diagnosis not present

## 2021-09-25 DIAGNOSIS — I700 Atherosclerosis of aorta: Secondary | ICD-10-CM | POA: Diagnosis not present

## 2021-09-26 DIAGNOSIS — R0609 Other forms of dyspnea: Secondary | ICD-10-CM | POA: Diagnosis not present

## 2021-09-26 DIAGNOSIS — Z9981 Dependence on supplemental oxygen: Secondary | ICD-10-CM | POA: Diagnosis not present

## 2021-09-26 DIAGNOSIS — R251 Tremor, unspecified: Secondary | ICD-10-CM | POA: Diagnosis not present

## 2021-09-26 DIAGNOSIS — J449 Chronic obstructive pulmonary disease, unspecified: Secondary | ICD-10-CM | POA: Diagnosis not present

## 2021-10-02 DIAGNOSIS — N400 Enlarged prostate without lower urinary tract symptoms: Secondary | ICD-10-CM | POA: Diagnosis not present

## 2021-10-02 DIAGNOSIS — R001 Bradycardia, unspecified: Secondary | ICD-10-CM | POA: Diagnosis not present

## 2021-10-02 DIAGNOSIS — R69 Illness, unspecified: Secondary | ICD-10-CM | POA: Diagnosis not present

## 2021-10-02 DIAGNOSIS — Z8673 Personal history of transient ischemic attack (TIA), and cerebral infarction without residual deficits: Secondary | ICD-10-CM | POA: Diagnosis not present

## 2021-10-02 DIAGNOSIS — E782 Mixed hyperlipidemia: Secondary | ICD-10-CM | POA: Diagnosis not present

## 2021-10-02 DIAGNOSIS — I6523 Occlusion and stenosis of bilateral carotid arteries: Secondary | ICD-10-CM | POA: Diagnosis not present

## 2021-10-02 DIAGNOSIS — J432 Centrilobular emphysema: Secondary | ICD-10-CM | POA: Diagnosis not present

## 2021-10-02 DIAGNOSIS — D649 Anemia, unspecified: Secondary | ICD-10-CM | POA: Diagnosis not present

## 2021-10-02 DIAGNOSIS — I700 Atherosclerosis of aorta: Secondary | ICD-10-CM | POA: Diagnosis not present

## 2021-10-02 DIAGNOSIS — J9601 Acute respiratory failure with hypoxia: Secondary | ICD-10-CM | POA: Diagnosis not present

## 2021-10-08 DIAGNOSIS — E782 Mixed hyperlipidemia: Secondary | ICD-10-CM | POA: Diagnosis not present

## 2021-10-08 DIAGNOSIS — I700 Atherosclerosis of aorta: Secondary | ICD-10-CM | POA: Diagnosis not present

## 2021-10-08 DIAGNOSIS — N400 Enlarged prostate without lower urinary tract symptoms: Secondary | ICD-10-CM | POA: Diagnosis not present

## 2021-10-08 DIAGNOSIS — D649 Anemia, unspecified: Secondary | ICD-10-CM | POA: Diagnosis not present

## 2021-10-08 DIAGNOSIS — J432 Centrilobular emphysema: Secondary | ICD-10-CM | POA: Diagnosis not present

## 2021-10-08 DIAGNOSIS — R001 Bradycardia, unspecified: Secondary | ICD-10-CM | POA: Diagnosis not present

## 2021-10-08 DIAGNOSIS — J9601 Acute respiratory failure with hypoxia: Secondary | ICD-10-CM | POA: Diagnosis not present

## 2021-10-08 DIAGNOSIS — Z8673 Personal history of transient ischemic attack (TIA), and cerebral infarction without residual deficits: Secondary | ICD-10-CM | POA: Diagnosis not present

## 2021-10-08 DIAGNOSIS — R69 Illness, unspecified: Secondary | ICD-10-CM | POA: Diagnosis not present

## 2021-10-08 DIAGNOSIS — I6523 Occlusion and stenosis of bilateral carotid arteries: Secondary | ICD-10-CM | POA: Diagnosis not present

## 2021-10-09 ENCOUNTER — Other Ambulatory Visit: Payer: Self-pay

## 2021-10-09 ENCOUNTER — Ambulatory Visit (INDEPENDENT_AMBULATORY_CARE_PROVIDER_SITE_OTHER): Payer: Medicare HMO | Admitting: Family Medicine

## 2021-10-09 VITALS — BP 114/74 | HR 63 | Ht 73.0 in | Wt 132.0 lb

## 2021-10-09 DIAGNOSIS — J440 Chronic obstructive pulmonary disease with acute lower respiratory infection: Secondary | ICD-10-CM | POA: Diagnosis not present

## 2021-10-09 DIAGNOSIS — I679 Cerebrovascular disease, unspecified: Secondary | ICD-10-CM | POA: Diagnosis not present

## 2021-10-09 DIAGNOSIS — I700 Atherosclerosis of aorta: Secondary | ICD-10-CM

## 2021-10-09 DIAGNOSIS — J4520 Mild intermittent asthma, uncomplicated: Secondary | ICD-10-CM

## 2021-10-09 DIAGNOSIS — F419 Anxiety disorder, unspecified: Secondary | ICD-10-CM

## 2021-10-09 DIAGNOSIS — R69 Illness, unspecified: Secondary | ICD-10-CM | POA: Diagnosis not present

## 2021-10-09 DIAGNOSIS — J209 Acute bronchitis, unspecified: Secondary | ICD-10-CM | POA: Diagnosis not present

## 2021-10-09 DIAGNOSIS — E785 Hyperlipidemia, unspecified: Secondary | ICD-10-CM

## 2021-10-09 DIAGNOSIS — G250 Essential tremor: Secondary | ICD-10-CM

## 2021-10-09 DIAGNOSIS — J432 Centrilobular emphysema: Secondary | ICD-10-CM

## 2021-10-09 MED ORDER — CLOPIDOGREL BISULFATE 75 MG PO TABS: ORAL_TABLET | ORAL | 1 refills | Status: DC

## 2021-10-09 MED ORDER — SERTRALINE HCL 25 MG PO TABS: 25.0000 mg | ORAL_TABLET | Freq: Every day | ORAL | 1 refills | Status: DC

## 2021-10-09 MED ORDER — ALBUTEROL SULFATE HFA 108 (90 BASE) MCG/ACT IN AERS
2.0000 | INHALATION_SPRAY | Freq: Four times a day (QID) | RESPIRATORY_TRACT | 11 refills | Status: AC | PRN
Start: 2021-10-09 — End: ?

## 2021-10-09 MED ORDER — MONTELUKAST SODIUM 10 MG PO TABS: 10.0000 mg | ORAL_TABLET | Freq: Every day | ORAL | 1 refills | Status: DC

## 2021-10-09 MED ORDER — ATORVASTATIN CALCIUM 80 MG PO TABS: 80.0000 mg | ORAL_TABLET | Freq: Every day | ORAL | 1 refills | Status: DC

## 2021-10-09 MED ORDER — PROPRANOLOL HCL 10 MG PO TABS: 10.0000 mg | ORAL_TABLET | Freq: Every morning | ORAL | 1 refills | Status: DC

## 2021-10-09 NOTE — Progress Notes (Signed)
Date:  10/09/2021   Name:  Nathaniel Garcia   DOB:  09/06/1946   MRN:  [id number]   Chief Complaint: Follow-up  Patient is a 75 year old male who presents for a comprehensive physical exam. The patient reports the following problems: copd/stage 4. Health maintenance has been reviewed up to date.    Neurologic Problem The patient's pertinent negatives include no altered mental status, clumsiness, focal sensory loss, focal weakness, loss of balance, memory loss, near-syncope, slurred speech, syncope, visual change or weakness. Primary symptoms comment: for tremor. This is a chronic problem. The problem has been gradually worsening since onset. Pertinent negatives include no fatigue or headaches. The treatment provided mild relief.  Hyperlipidemia This is a chronic problem. The current episode started more than 1 year ago. The problem is controlled. Recent lipid tests were reviewed and are normal. Pertinent negatives include no focal sensory loss, focal weakness or myalgias. Current antihyperlipidemic treatment includes statins. The current treatment provides mild improvement of lipids. There are no compliance problems.   Depression      (for tremor)  This is a chronic problem.  The current episode started more than 1 year ago.   Associated symptoms include no decreased concentration, no fatigue, no helplessness, no hopelessness, does not have insomnia, not irritable, no restlessness, no decreased interest, no appetite change, no body aches, no myalgias, no headaches, no indigestion, not sad and no suicidal ideas.  Past treatments include SSRIs - Selective serotonin reuptake inhibitors.  Lab Results  Component Value Date   CREATININE 1.12 08/30/2021   BUN 22 08/30/2021   NA 137 08/30/2021   K 4.0 08/30/2021   CL 102 08/30/2021   CO2 28 08/30/2021   Lab Results  Component Value Date   CHOL 177 06/29/2018   HDL 54 06/29/2018   LDLCALC 110 (H) 06/29/2018   TRIG 64 06/29/2018    CHOLHDL 3.3 06/29/2018   Lab Results  Component Value Date   TSH 1.650 05/30/2019   No results found for: HGBA1C Lab Results  Component Value Date   WBC 8.5 08/30/2021   HGB 12.8 (L) 08/30/2021   HCT 38.5 (L) 08/30/2021   MCV 102.7 (H) 08/30/2021   PLT 163 08/30/2021   Lab Results  Component Value Date   ALT 17 08/28/2021   AST 15 08/28/2021   ALKPHOS 45 08/28/2021   BILITOT 0.6 08/28/2021   No components found for: VITD  Review of Systems  Constitutional:  Negative for appetite change and fatigue.  Cardiovascular:  Negative for near-syncope.  Musculoskeletal:  Negative for myalgias.  Neurological:  Negative for focal weakness, syncope, weakness, headaches and loss of balance.  Psychiatric/Behavioral:  Positive for depression. Negative for decreased concentration, memory loss and suicidal ideas. The patient does not have insomnia.    Patient Active Problem List   Diagnosis Date Noted   COPD with acute exacerbation (HCC) 01/03/2021   Bullous emphysema (HCC) 01/03/2021   Acute exacerbation of chronic obstructive pulmonary disease (COPD) (HCC) 10/31/2020   COPD exacerbation (HCC) 10/29/2020   Acute respiratory failure with hypoxia (HCC) 10/29/2020   History of stroke 10/29/2020   Aortic atherosclerosis (HCC) 12/30/2018   Benign hematuria 09/18/2016   Weakness of left hand 07/02/2016   Lipid screening 01/03/2016   Cerebral vascular disease 09/14/2015   Carotid artery narrowing 08/15/2015   Bilateral carotid artery stenosis 08/15/2015   Bradycardia 08/01/2015   H/O transient cerebral ischemia 08/01/2015   Combined fat and carbohydrate induced hyperlipemia 08/01/2015  Breathlessness on exertion 07/30/2015   Absolute anemia 04/03/2015   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[place]) 04/03/2015   Cerebral vascular accident ([place]) 04/03/2015   HLD (hyperlipidemia) 04/03/2015   Blood glucose elevated 04/03/2015   Lung nodule, solitary 04/03/2015   Non compliance with medical treatment  04/03/2015   Lung nodule, multiple 04/03/2015   Cerebral infarction ([place]) 04/03/2015   Abnormal lung field 04/03/2015    Allergies  Allergen Reactions   Hctz [Hydrochlorothiazide] Rash    Past Surgical History:  Procedure Laterality Date   COLONOSCOPY  2011   normal- MD docs    Social History   Tobacco Use   Smoking status: Former    Packs/day: 0.02    Years: 60.00    Pack years: 1.20    Types: Cigarettes    Quit date: 10/26/2020    Years since quitting: 0.9   Smokeless tobacco: Never   Tobacco comments:    [place]  smoking cessation information provided  Vaping Use   Vaping Use: Never used  Substance Use Topics   Alcohol use: Yes    Alcohol/week: 2.0 standard drinks    Types: 2 Cans of beer per week    Comment: weekly   Drug use: Not Currently    Comment: marijuana     Medication list has been reviewed and updated.  Current Meds  Medication Sig   albuterol (VENTOLIN HFA) 108 (90 Base) MCG/ACT inhaler INHALE 2 PUFFS INTO THE LUNGS EVERY 6 HOURS AS NEEDED FOR WHEEZING OR SHORTNESS OF BREATH   atorvastatin (LIPITOR) 80 MG tablet Take 1 tablet (80 mg total) by mouth daily.   budesonide (PULMICORT) 0.5 MG/2ML nebulizer solution Take 0.5 mg by nebulization daily as needed.   clopidogrel (PLAVIX) 75 MG tablet TAKE 1 TABLET(75 MG) BY MOUTH DAILY   ipratropium-albuterol (DUONEB) 0.5-2.5 (3) MG/3ML SOLN Take 3 mLs by nebulization 4 (four) times daily.   montelukast (SINGULAIR) 10 MG tablet Take 1 tablet (10 mg total) by mouth at bedtime.   predniSONE (DELTASONE) 5 MG tablet Take 5 mg by mouth daily.   sertraline (ZOLOFT) 25 MG tablet Take 1 tablet (25 mg total) by mouth at bedtime.   tamsulosin (FLOMAX) 0.4 MG CAPS capsule TAKE 1 CAPSULE(0.4 MG) BY MOUTH DAILY    PHQ 2/9 Scores 10/09/2021 08/01/2021 07/12/2021 07/01/2021  PHQ - 2 Score 0 0 0 0  PHQ- 9 Score 0 0 2 -    GAD 7 : Generalized Anxiety Score 10/09/2021 08/01/2021 07/12/2021 07/12/2021  Nervous, Anxious, on  Edge 0 0 1 0  Control/stop worrying 0 0 0 0  Worry too much - different things 0 0 0 0  Trouble relaxing 0 0 1 0  Restless 0 0 1 0  Easily annoyed or irritable 0 0 0 0  Afraid - awful might happen 0 0 0 0  Total GAD 7 Score 0 0 3 0  Anxiety Difficulty Not difficult at all - Somewhat difficult -    BP Readings from Last 3 Encounters:  10/09/21 114/74  09/12/21 114/62  09/04/21 107/61    Physical Exam Constitutional:      General: He is not irritable.   Wt Readings from Last 3 Encounters:  10/09/21 132 lb (59.9 kg)  09/12/21 126 lb 14.4 oz (57.6 kg)  08/27/21 134 lb (60.8 kg)    BP 114/74 (BP Location: Left Arm, Patient Position: Sitting, Cuff Size: Normal)   Pulse 63   Ht 6\' 1"  (1.854 m)   Wt 132 lb (59.9 kg)  SpO2 98%   BMI 17.42 kg/m   Assessment and Plan:  1. Mild intermittent reactive airway disease without complication Chronic.  Controlled.  Stable.  Continue Singulair 10 mg once a day. - montelukast (SINGULAIR) 10 MG tablet; Take 1 tablet (10 mg total) by mouth at bedtime.  Dispense: 90 tablet; Refill: 1  2. Cerebral vascular disease Chronic.  Controlled.  Stable.  With history of stroke we will continue with Plavix 75 mg once a day and control of hyperlipidemia with atorvastatin 80 mg once a day. - clopidogrel (PLAVIX) 75 MG tablet; TAKE 1 TABLET(75 MG) BY MOUTH DAILY  Dispense: 90 tablet; Refill: 1 - atorvastatin (LIPITOR) 80 MG tablet; Take 1 tablet (80 mg total) by mouth daily.  Dispense: 90 tablet; Refill: 1  3. Aortic atherosclerosis ([place]) Patient noted with aortic atherosclerosis and control with lipid management and Plavix 75 mg once a day. - clopidogrel (PLAVIX) 75 MG tablet; TAKE 1 TABLET(75 MG) BY MOUTH DAILY  Dispense: 90 tablet; Refill: 1  4. Hyperlipidemia, unspecified hyperlipidemia type As noted - atorvastatin (LIPITOR) 80 MG tablet; Take 1 tablet (80 mg total) by mouth daily.  Dispense: 90 tablet; Refill: 1  5. Benign essential  tremor Chronic.  Episodic.  Mild.  Not debilitating.  Currently is taking propranolol 10 mg once a day for control of symptoms. - propranolol (INDERAL) 10 MG tablet; Take 1 tablet (10 mg total) by mouth every morning.  Dispense: 90 tablet; Refill: 1  6. Anxiety Chronic.  Controlled.  Stable.  Gad score 0.  Continue sertraline 25 mg once a day. - sertraline (ZOLOFT) 25 MG tablet; Take 1 tablet (25 mg total) by mouth at bedtime.  Dispense: 90 tablet; Refill: 1  7. 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HCC) Chronic.  Controlled.  Stable.  Continue albuterol 2 puffs every 6 hours as needed wheezing - albuterol (VENTOLIN HFA) 108 (90 Base) MCG/ACT inhaler; Inhale 2 puffs into the lungs every 6 (six) hours as needed for wheezing or shortness of breath.  Dispense: 6.7 g; Refill: 11  8. Acute bronchitis with chronic obstructive pulmonary disease (COPD) (HCC) As noted above - albuterol (VENTOLIN HFA) 108 (90 Base) MCG/ACT inhaler; Inhale 2 puffs into the lungs every 6 (six) hours as needed for wheezing or shortness of breath.  Dispense: 6.7 g; Refill: 11

## 2021-10-14 ENCOUNTER — Telehealth: Payer: Self-pay

## 2021-10-14 NOTE — Telephone Encounter (Signed)
Pt LM on triage line questioning if his CT scan will require contrast as he is worried about long term effects. Spoke with [place], PA who reviewed pt's recent kidney function and states that pt is fine to proceed with CT. Called pt informed him of that information. Pt gave verbal understanding.

## 2021-10-16 ENCOUNTER — Telehealth: Payer: Self-pay | Admitting: Family Medicine

## 2021-10-16 DIAGNOSIS — R001 Bradycardia, unspecified: Secondary | ICD-10-CM | POA: Diagnosis not present

## 2021-10-16 DIAGNOSIS — R69 Illness, unspecified: Secondary | ICD-10-CM | POA: Diagnosis not present

## 2021-10-16 DIAGNOSIS — I700 Atherosclerosis of aorta: Secondary | ICD-10-CM | POA: Diagnosis not present

## 2021-10-16 DIAGNOSIS — J9601 Acute respiratory failure with hypoxia: Secondary | ICD-10-CM | POA: Diagnosis not present

## 2021-10-16 DIAGNOSIS — Z8673 Personal history of transient ischemic attack (TIA), and cerebral infarction without residual deficits: Secondary | ICD-10-CM | POA: Diagnosis not present

## 2021-10-16 DIAGNOSIS — E782 Mixed hyperlipidemia: Secondary | ICD-10-CM | POA: Diagnosis not present

## 2021-10-16 DIAGNOSIS — J432 Centrilobular emphysema: Secondary | ICD-10-CM | POA: Diagnosis not present

## 2021-10-16 DIAGNOSIS — I6523 Occlusion and stenosis of bilateral carotid arteries: Secondary | ICD-10-CM | POA: Diagnosis not present

## 2021-10-16 DIAGNOSIS — N400 Enlarged prostate without lower urinary tract symptoms: Secondary | ICD-10-CM | POA: Diagnosis not present

## 2021-10-16 DIAGNOSIS — D649 Anemia, unspecified: Secondary | ICD-10-CM | POA: Diagnosis not present

## 2021-10-16 NOTE — Telephone Encounter (Signed)
Pt states he has a wheelchair that he does not need.  Pt states he did not use, and did not order.  Now the company that brought it out does not want to take it back because it has been over 30 days. Pt says he does not want to pay for something he doesn't need. Would like Nathaniel Garcia to call him and help out.

## 2021-10-21 ENCOUNTER — Ambulatory Visit: Payer: Self-pay

## 2021-10-21 NOTE — Telephone Encounter (Signed)
Pt c/o intermittent pain located under his left shoulder blade and pain to the top f his left shoulder and left wrist. Denies pain at the shoulder blade during call.  Pt had SOB this am and took all of his respiratory medication and stated it helped with his breathing. Pt c/o difficulty swallowing last 2 days. Pt also c/o cramps" to both calves when he stretches. Pt has h/o CVA x 3 and stated he does not feel like he did before his strokes. Pt denied weakness or numbness to face, arms or legs.  Denied pain with a deep breath. Pt stated he has been sleeping on the couch and feels that may be a factor.  Pt denied redness or warmth to the calf's. Denies cramping during call.  Pt wears O2 at 3 LPM.  Discussed concern of heart related issue. With his hx advised to go to ED. Pt reluctant. Pt stated he will call 911 if he has anymore issues.    Reason for Disposition  [1] Age > 40 AND [2] no obvious cause AND [3] pain even when not moving the arm  (Exception: pain is clearly made worse by moving arm or bending neck)  Passed out (i.e., lost consciousness, collapsed and was not responding)  Answer Assessment - Initial Assessment Questions 1. ONSET: "When did the pain start?"     Today 0300 2. LOCATION: "Where is the pain located?"     Under left shoulder blade 3. PAIN: "How bad is the pain?" (Scale 1-10; or mild, moderate, severe)   - MILD (1-3): doesn't interfere with normal activities   - MODERATE (4-7): interferes with normal activities (e.g., work or school) or awakens from sleep   - SEVERE (8-10): excruciating pain, unable to do any normal activities, unable to move arm at all due to pain     none O2  wears 3 LPM comes- no pain- sharp 10/10  4. WORK OR EXERCISE: "Has there been any recent work or exercise that involved this part of the body?"     no 5. CAUSE: "What do you think is causing the shoulder pain?"     Unknown - sleeping on cough 6. OTHER SYMPTOMS: "Do you have any other symptoms?"  (e.g., neck pain, swelling, rash, fever, numbness, weakness)     SOB this am-did asthma meds h/o COPD felt like it was an attack.- hard to swallow last 2 days 7. PREGNANCY: "Is there any chance you are pregnant?" "When was your last menstrual period?"     N/a  Answer Assessment - Initial Assessment Questions 1. LOCATION: "Where does it hurt?"       Top of left shoulder and under shoulder blade 2. RADIATION: "Does the pain go anywhere else?" (e.g., into neck, jaw, arms, back)     Left wrist only not entire left arm 3. ONSET: "When did the chest pain begin?" (Minutes, hours or days)      Shoulder blade pain 0300 4. PATTERN "Does the pain come and go, or has it been constant since it started?"  "Does it get worse with exertion?"      Comes and goes 5. DURATION: "How long does it last" (e.g., seconds, minutes, hours)     *No Answer* 6. SEVERITY: "How bad is the pain?"  (e.g., Scale 1-10; mild, moderate, or severe)    - MILD (1-3): doesn't interfere with normal activities     - MODERATE (4-7): interferes with normal activities or awakens from sleep    - SEVERE (  8-10): excruciating pain, unable to do any normal activities       No pain at this time 7. CARDIAC RISK FACTORS: "Do you have any history of heart problems or risk factors for heart disease?" (e.g., angina, prior heart attack; diabetes, high blood pressure, high cholesterol, smoker, or strong family history of heart disease)    HTN 8. PULMONARY RISK FACTORS: "Do you have any history of lung disease?"  (e.g., blood clots in lung, asthma, emphysema, birth control pills)     COPD 9. CAUSE: "What do you think is causing the chest pain?"     Thinks it coud be due to sleeping on couch 10. OTHER SYMPTOMS: "Do you have any other symptoms?" (e.g., dizziness, nausea, vomiting, sweating, fever, difficulty breathing, cough)       SOB  this am and used meds 11. PREGNANCY: "Is there any chance you are pregnant?" "When was your last menstrual  period?"       N/a  Protocols used: Shoulder Pain-A-AH, Chest Pain-A-AH

## 2021-10-21 NOTE — Telephone Encounter (Signed)
Called pt to advise him to go to ED. Pt stated that he feels fine at the moment. Told pt to go to ER if he starts feeling bad again. Pt verbalized understanding.  KP

## 2021-10-23 ENCOUNTER — Other Ambulatory Visit: Payer: Self-pay

## 2021-10-23 ENCOUNTER — Ambulatory Visit
Admission: RE | Admit: 2021-10-23 | Discharge: 2021-10-23 | Disposition: A | Discharge status: Home / Self Care | Payer: Medicare HMO | Source: Ambulatory Visit | Attending: Urology | Admitting: Urology

## 2021-10-23 DIAGNOSIS — N3289 Other specified disorders of bladder: Secondary | ICD-10-CM | POA: Diagnosis not present

## 2021-10-23 DIAGNOSIS — R3129 Other microscopic hematuria: Secondary | ICD-10-CM | POA: Insufficient documentation

## 2021-10-23 DIAGNOSIS — J432 Centrilobular emphysema: Secondary | ICD-10-CM | POA: Diagnosis not present

## 2021-10-23 DIAGNOSIS — Z9981 Dependence on supplemental oxygen: Secondary | ICD-10-CM | POA: Diagnosis not present

## 2021-10-23 DIAGNOSIS — K5730 Diverticulosis of large intestine without perforation or abscess without bleeding: Secondary | ICD-10-CM | POA: Diagnosis not present

## 2021-10-23 DIAGNOSIS — K3189 Other diseases of stomach and duodenum: Secondary | ICD-10-CM | POA: Diagnosis not present

## 2021-10-23 DIAGNOSIS — R0609 Other forms of dyspnea: Secondary | ICD-10-CM | POA: Diagnosis not present

## 2021-10-23 LAB — POCT I-STAT CREATININE: Creatinine, Ser: 1.2 mg/dL (ref 0.61–1.24)

## 2021-10-23 MED ORDER — IOHEXOL 350 MG/ML SOLN
100.0000 mL | Freq: Once | INTRAVENOUS | Status: AC | PRN
  Administered 2021-10-23: 100 mL via INTRAVENOUS

## 2021-10-24 DIAGNOSIS — E782 Mixed hyperlipidemia: Secondary | ICD-10-CM | POA: Diagnosis not present

## 2021-10-24 DIAGNOSIS — J432 Centrilobular emphysema: Secondary | ICD-10-CM | POA: Diagnosis not present

## 2021-10-24 DIAGNOSIS — Z8673 Personal history of transient ischemic attack (TIA), and cerebral infarction without residual deficits: Secondary | ICD-10-CM | POA: Diagnosis not present

## 2021-10-24 DIAGNOSIS — R001 Bradycardia, unspecified: Secondary | ICD-10-CM | POA: Diagnosis not present

## 2021-10-24 DIAGNOSIS — R69 Illness, unspecified: Secondary | ICD-10-CM | POA: Diagnosis not present

## 2021-10-24 DIAGNOSIS — J9601 Acute respiratory failure with hypoxia: Secondary | ICD-10-CM | POA: Diagnosis not present

## 2021-10-24 DIAGNOSIS — I700 Atherosclerosis of aorta: Secondary | ICD-10-CM | POA: Diagnosis not present

## 2021-10-24 DIAGNOSIS — I6523 Occlusion and stenosis of bilateral carotid arteries: Secondary | ICD-10-CM | POA: Diagnosis not present

## 2021-10-24 DIAGNOSIS — D649 Anemia, unspecified: Secondary | ICD-10-CM | POA: Diagnosis not present

## 2021-10-24 DIAGNOSIS — N400 Enlarged prostate without lower urinary tract symptoms: Secondary | ICD-10-CM | POA: Diagnosis not present

## 2021-10-25 DIAGNOSIS — J441 Chronic obstructive pulmonary disease with (acute) exacerbation: Secondary | ICD-10-CM | POA: Diagnosis not present

## 2021-10-25 DIAGNOSIS — G4733 Obstructive sleep apnea (adult) (pediatric): Secondary | ICD-10-CM | POA: Diagnosis not present

## 2021-10-25 DIAGNOSIS — J449 Chronic obstructive pulmonary disease, unspecified: Secondary | ICD-10-CM | POA: Diagnosis not present

## 2021-10-25 DIAGNOSIS — J439 Emphysema, unspecified: Secondary | ICD-10-CM | POA: Diagnosis not present

## 2021-10-26 DIAGNOSIS — J449 Chronic obstructive pulmonary disease, unspecified: Secondary | ICD-10-CM | POA: Diagnosis not present

## 2021-10-29 ENCOUNTER — Other Ambulatory Visit: Payer: Self-pay

## 2021-10-29 ENCOUNTER — Telehealth: Payer: Self-pay

## 2021-10-29 ENCOUNTER — Encounter (HOSPITAL_COMMUNITY): Payer: Self-pay | Admitting: Emergency Medicine

## 2021-10-29 ENCOUNTER — Ambulatory Visit: Payer: Self-pay | Admitting: *Deleted

## 2021-10-29 ENCOUNTER — Inpatient Hospital Stay (HOSPITAL_COMMUNITY)
Admission: EM | Admit: 2021-10-29 | Discharge: 2021-11-01 | DRG: 190 | Disposition: A | Discharge status: Home Health | Payer: Medicare HMO | Attending: Family Medicine | Admitting: Family Medicine

## 2021-10-29 ENCOUNTER — Emergency Department (HOSPITAL_COMMUNITY): Payer: Medicare HMO

## 2021-10-29 ENCOUNTER — Ambulatory Visit (HOSPITAL_COMMUNITY)
Admission: EM | Admit: 2021-10-29 | Discharge: 2021-10-29 | Disposition: A | Discharge status: Home / Self Care | Payer: Medicare HMO | Attending: Family Medicine | Admitting: Family Medicine

## 2021-10-29 DIAGNOSIS — Z8673 Personal history of transient ischemic attack (TIA), and cerebral infarction without residual deficits: Secondary | ICD-10-CM

## 2021-10-29 DIAGNOSIS — I679 Cerebrovascular disease, unspecified: Secondary | ICD-10-CM

## 2021-10-29 DIAGNOSIS — I700 Atherosclerosis of aorta: Secondary | ICD-10-CM | POA: Diagnosis present

## 2021-10-29 DIAGNOSIS — Z79899 Other long term (current) drug therapy: Secondary | ICD-10-CM

## 2021-10-29 DIAGNOSIS — Z9981 Dependence on supplemental oxygen: Secondary | ICD-10-CM

## 2021-10-29 DIAGNOSIS — J441 Chronic obstructive pulmonary disease with (acute) exacerbation: Secondary | ICD-10-CM | POA: Diagnosis present

## 2021-10-29 DIAGNOSIS — R0603 Acute respiratory distress: Secondary | ICD-10-CM

## 2021-10-29 DIAGNOSIS — I491 Atrial premature depolarization: Secondary | ICD-10-CM | POA: Diagnosis present

## 2021-10-29 DIAGNOSIS — R9431 Abnormal electrocardiogram [ECG] [EKG]: Secondary | ICD-10-CM

## 2021-10-29 DIAGNOSIS — Z7952 Long term (current) use of systemic steroids: Secondary | ICD-10-CM

## 2021-10-29 DIAGNOSIS — F419 Anxiety disorder, unspecified: Secondary | ICD-10-CM | POA: Diagnosis present

## 2021-10-29 DIAGNOSIS — J439 Emphysema, unspecified: Secondary | ICD-10-CM | POA: Diagnosis not present

## 2021-10-29 DIAGNOSIS — R350 Frequency of micturition: Secondary | ICD-10-CM | POA: Diagnosis present

## 2021-10-29 DIAGNOSIS — Z7902 Long term (current) use of antithrombotics/antiplatelets: Secondary | ICD-10-CM

## 2021-10-29 DIAGNOSIS — R0602 Shortness of breath: Secondary | ICD-10-CM

## 2021-10-29 DIAGNOSIS — Z20822 Contact with and (suspected) exposure to covid-19: Secondary | ICD-10-CM | POA: Diagnosis present

## 2021-10-29 DIAGNOSIS — E785 Hyperlipidemia, unspecified: Secondary | ICD-10-CM | POA: Diagnosis present

## 2021-10-29 DIAGNOSIS — Z7951 Long term (current) use of inhaled steroids: Secondary | ICD-10-CM

## 2021-10-29 DIAGNOSIS — R062 Wheezing: Secondary | ICD-10-CM

## 2021-10-29 DIAGNOSIS — J9601 Acute respiratory failure with hypoxia: Secondary | ICD-10-CM

## 2021-10-29 DIAGNOSIS — I4949 Other premature depolarization: Secondary | ICD-10-CM

## 2021-10-29 DIAGNOSIS — R0600 Dyspnea, unspecified: Secondary | ICD-10-CM | POA: Diagnosis not present

## 2021-10-29 DIAGNOSIS — I493 Ventricular premature depolarization: Secondary | ICD-10-CM | POA: Diagnosis present

## 2021-10-29 DIAGNOSIS — Z91199 Patient's noncompliance with other medical treatment and regimen due to unspecified reason: Secondary | ICD-10-CM

## 2021-10-29 DIAGNOSIS — J9621 Acute and chronic respiratory failure with hypoxia: Secondary | ICD-10-CM | POA: Diagnosis present

## 2021-10-29 DIAGNOSIS — G250 Essential tremor: Secondary | ICD-10-CM | POA: Diagnosis present

## 2021-10-29 DIAGNOSIS — N401 Enlarged prostate with lower urinary tract symptoms: Secondary | ICD-10-CM | POA: Diagnosis present

## 2021-10-29 DIAGNOSIS — E871 Hypo-osmolality and hyponatremia: Secondary | ICD-10-CM | POA: Diagnosis not present

## 2021-10-29 LAB — CBC WITH DIFFERENTIAL/PLATELET
Abs Immature Granulocytes: 0.03 10*3/uL (ref 0.00–0.07)
Basophils Absolute: 0.1 10*3/uL (ref 0.0–0.1)
Basophils Relative: 1 %
Eosinophils Absolute: 0.3 10*3/uL (ref 0.0–0.5)
Eosinophils Relative: 4 %
HCT: 39.4 % (ref 39.0–52.0)
Hemoglobin: 13.1 g/dL (ref 13.0–17.0)
Immature Granulocytes: 0 %
Lymphocytes Relative: 19 %
Lymphs Abs: 1.6 10*3/uL (ref 0.7–4.0)
MCH: 34.4 pg — ABNORMAL HIGH (ref 26.0–34.0)
MCHC: 33.2 g/dL (ref 30.0–36.0)
MCV: 103.4 fL — ABNORMAL HIGH (ref 80.0–100.0)
Monocytes Absolute: 0.9 10*3/uL (ref 0.1–1.0)
Monocytes Relative: 11 %
Neutro Abs: 5.5 10*3/uL (ref 1.7–7.7)
Neutrophils Relative %: 65 %
Platelets: 229 10*3/uL (ref 150–400)
RBC: 3.81 MIL/uL — ABNORMAL LOW (ref 4.22–5.81)
RDW: 12.7 % (ref 11.5–15.5)
WBC: 8.3 10*3/uL (ref 4.0–10.5)
nRBC: 0 % (ref 0.0–0.2)

## 2021-10-29 LAB — BASIC METABOLIC PANEL
Anion gap: 7 (ref 5–15)
BUN: 12 mg/dL (ref 8–23)
CO2: 27 mmol/L (ref 22–32)
Calcium: 8.7 mg/dL — ABNORMAL LOW (ref 8.9–10.3)
Chloride: 106 mmol/L (ref 98–111)
Creatinine, Ser: 1.2 mg/dL (ref 0.61–1.24)
GFR, Estimated: >60 mL/min (ref >60)
Glucose, Bld: 98 mg/dL (ref 70–99)
Potassium: 3.7 mmol/L (ref 3.5–5.1)
Sodium: 140 mmol/L (ref 135–145)

## 2021-10-29 LAB — RESP PANEL BY RT-PCR (FLU A&B, COVID) ARPGX2
Influenza A by PCR: NEGATIVE
Influenza B by PCR: NEGATIVE
SARS Coronavirus 2 by RT PCR: NEGATIVE

## 2021-10-29 MED ORDER — IPRATROPIUM-ALBUTEROL 0.5-2.5 (3) MG/3ML IN SOLN: 3.0000 mL | Freq: Once | RESPIRATORY_TRACT | Status: DC

## 2021-10-29 NOTE — Telephone Encounter (Signed)
Spoke to pt and he is having SOB, going through inhalers every 3 days. Needs further evaluation by urgent care per Dr Reita Cliche office as well.

## 2021-10-29 NOTE — ED Provider Notes (Signed)
[facility]    CSN: [id number] Arrival date & time: 10/29/21  1637      History   Chief Complaint Chief Complaint  Patient presents with   Shortness of Breath    HPI Nathaniel Garcia is a 75 y.o. male.   HPI Patient presents today with a medical history of COPD, history of stroke,  And also has a history of acute respiratory failure.  He has contacted his primary care doctor a couple times a day complaining of over 3 days of progressively worsening shortness of breath.  He reports the shortness of breath is exacerbated exertion and he has used albuterol persistently over the last 3 days without any relief.  On arrival patient is diffusely wheezing with visible accessory muscle use with breathing.  He is denying any chest pain or left shoulder pain however this was noted as a symptom when he contacted his primary care office.  His oxygen level has fluctuated between 92 and 96%.  His breathing has fluctuated between 18-22 breaths/min.  He is prescribed home oxygen at 3 liters continuously however arrived here without oxygen.  He denies any associated URI symptoms such as cough, congestion, or fever.  Past Medical History:  Diagnosis Date   COPD (chronic obstructive pulmonary disease) (HCC)    Hematuria    Hyperlipidemia    Stroke [facility])     Patient Active Problem List   Diagnosis Date Noted   COPD with acute exacerbation (HCC) 01/03/2021   Bullous emphysema (HCC) 01/03/2021   Acute exacerbation of chronic obstructive pulmonary disease (COPD) (HCC) 10/31/2020   COPD exacerbation (HCC) 10/29/2020   Acute respiratory failure with hypoxia (HCC) 10/29/2020   History of stroke 10/29/2020   Aortic atherosclerosis (HCC) 12/30/2018   Benign hematuria 09/18/2016   Weakness of left hand 07/02/2016   Lipid screening 01/03/2016   Cerebral vascular disease 09/14/2015   Carotid artery narrowing 08/15/2015   Bilateral carotid artery stenosis 08/15/2015   Bradycardia  08/01/2015   H/O transient cerebral ischemia 08/01/2015   Combined fat and carbohydrate induced hyperlipemia 08/01/2015   Breathlessness on exertion 07/30/2015   Absolute anemia 04/03/2015   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   Cerebral vascular accident (HCC) 04/03/2015   HLD (hyperlipidemia) 04/03/2015   Blood glucose elevated 04/03/2015   Lung nodule, solitary 04/03/2015   Non compliance with medical treatment 04/03/2015   Lung nodule, multiple 04/03/2015   Cerebral infarction (HCC) 04/03/2015   Abnormal lung field 04/03/2015    Past Surgical History:  Procedure Laterality Date   COLONOSCOPY  2011   normal- MD docs       Home Medications    Prior to Admission medications   Medication Sig Start Date End Date Taking? Authorizing Provider  albuterol (VENTOLIN HFA) 108 (90 Base) MCG/ACT inhaler Inhale 2 puffs into the lungs every 6 (six) hours as needed for wheezing or shortness of breath. 10/09/21   Duanne Limerick, MD  atorvastatin (LIPITOR) 80 MG tablet Take 1 tablet (80 mg total) by mouth daily. 10/09/21   Duanne Limerick, MD  budesonide (PULMICORT) 0.5 MG/2ML nebulizer solution Take 0.5 mg by nebulization daily as needed. 09/18/21   [provider]  clopidogrel (PLAVIX) 75 MG tablet TAKE 1 TABLET(75 MG) BY MOUTH DAILY 10/09/21   Duanne Limerick, MD  ipratropium-albuterol (DUONEB) 0.5-2.5 (3) MG/3ML SOLN Take 3 mLs by nebulization 4 (four) times daily. 09/18/21   [provider]  montelukast (SINGULAIR) 10 MG tablet Take 1 tablet (10 mg  total) by mouth at bedtime. 10/09/21   Juline Patch, MD  nicotine (NICODERM CQ - DOSED IN MG/24 HOURS) 14 mg/24hr patch Place 1 patch (14 mg total) onto the skin daily. Patient not taking: Reported on 10/09/2021 09/05/21   Geradine Girt, DO  predniSONE (DELTASONE) 5 MG tablet Take 5 mg by mouth daily. 08/07/21   [provider]  propranolol (INDERAL) 10 MG tablet Take 1 tablet (10 mg total) by mouth every  morning. 10/09/21   Juline Patch, MD  sertraline (ZOLOFT) 25 MG tablet Take 1 tablet (25 mg total) by mouth at bedtime. 10/09/21   Juline Patch, MD  tamsulosin (FLOMAX) 0.4 MG CAPS capsule TAKE 1 CAPSULE(0.4 MG) BY MOUTH DAILY 08/12/21   McGowan, Hunt Oris, PA-C    Family History Family History  Problem Relation Age of Onset   Prostate cancer Neg Hx     Social History Social History   Tobacco Use   Smoking status: Former    Packs/day: 0.02    Years: 60.00    Pack years: 1.20    Types: Cigarettes    Quit date: 10/26/2020    Years since quitting: 1.0   Smokeless tobacco: Never   Tobacco comments:    [place] smoking cessation information provided  Vaping Use   Vaping Use: Never used  Substance Use Topics   Alcohol use: Yes    Alcohol/week: 2.0 standard drinks    Types: 2 Cans of beer per week    Comment: weekly   Drug use: Not Currently    Comment: marijuana     Allergies   Hctz [hydrochlorothiazide]   Review of Systems Review of Systems Pertinent negatives listed in HPI   Physical Exam Triage Vital Signs ED Triage Vitals  Enc Vitals Group     BP 10/29/21 1653 (!) 162/70     Pulse Rate 10/29/21 1653 (!) 47     Resp 10/29/21 1653 19     Temp 10/29/21 1653 97.8 F (36.6 C)     Temp Source 10/29/21 1653 Oral     SpO2 10/29/21 1653 96 %     Weight --      Height --      Head Circumference --      Peak Flow --      Pain Score 10/29/21 1650 0     Pain Loc --      Pain Edu? --      Excl. in [place]? --    No data found.  Updated Vital Signs BP (!) 162/70 (BP Location: Right Arm)    Pulse (!) 47    Temp 97.8 F (36.6 C) (Oral)    Resp 19    SpO2 96%   Visual Acuity Right Eye Distance:   Left Eye Distance:   Bilateral Distance:    Right Eye Near:   Left Eye Near:    Bilateral Near:     Physical Exam Constitutional:      General: He is in acute distress.  HENT:     Head: Normocephalic.  Eyes:     Extraocular Movements: Extraocular  movements intact.     Pupils: Pupils are equal, round, and reactive to light.  Neck:     Vascular: No JVD.  Cardiovascular:     Rate and Rhythm: Normal rate and regular rhythm.  Pulmonary:     Effort: Tachypnea, accessory muscle usage and respiratory distress present.     Breath sounds: Examination of the right-upper  field reveals wheezing. Examination of the left-upper field reveals wheezing. Examination of the right-middle field reveals wheezing. Examination of the left-middle field reveals wheezing. Examination of the right-lower field reveals wheezing. Examination of the left-lower field reveals wheezing. Wheezing present.  Musculoskeletal:     Cervical back: Normal range of motion and neck supple.  Skin:    General: Skin is warm and dry.     Capillary Refill: Capillary refill takes less than 2 seconds.  Neurological:     General: No focal deficit present.     Mental Status: He is alert.  Psychiatric:        Mood and Affect: Mood normal.     UC Treatments / Results  Labs (all labs ordered are listed, but only abnormal results are displayed) Labs Reviewed - No data to display  EKG Sinus Rhythm   Radiology No results found.  Procedures Procedures (including critical care time)  Medications Ordered in UC Medications - No data to display  Initial Impression / Assessment and Plan / UC Course  I have reviewed the triage vital signs and the nursing notes.  Pertinent labs & imaging results that were available during my care of the patient were reviewed by me and considered in my medical decision making (see chart for details).  Patient presents today in acute respiratory distress following 3 days of progressively worsening shortness of breath despite home oxygen and use of rescue inhalers.  Patient contacted his primary care doctor several times today and was advised to go to the emergency department because at that time he also reported some left shoulder pain associated with  the shortness of breath.  He presented here at the urgent care and is visibly showing some distress with breathing.  EKG obtained shows sinus rhythm with frequent PACs and occasional PVCs along with some nonspecific ST changes which is a change from prior EKG readings.  [facility] to [facility] emergency department for further evaluation and management.   Final Clinical Impressions(s) / UC Diagnoses   Final diagnoses:  SOB (shortness of breath)  Wheezing  COPD exacerbation (HCC)  Acute respiratory distress  Abnormal ECG   Discharge Instructions   None    ED Prescriptions   None    PDMP not reviewed this encounter.   Scot Jun, FNP 10/29/21 1726

## 2021-10-29 NOTE — ED Triage Notes (Signed)
Pt to triage via [facility] from [facility].  Reports SOB with exertion x 3 days.  Using inhaler.    Hx of COPD.  Denies pain.  99% on 2 liters.  Wears 2 liters at home.

## 2021-10-29 NOTE — ED Triage Notes (Signed)
Pt presents with sob. States has been using inhaler an entire inhaler every three days.

## 2021-10-29 NOTE — ED Provider Notes (Signed)
Emergency Medicine Provider Triage Evaluation Note  Nathaniel Garcia , a 75 y.o. male  was evaluated in triage.  Pt complains of shortness of breath with exertion x 2-3 days.  Patient reports he has stage IV emphysema and has home oxygen which he wears throughout the day except for when he sleeps.  He uses 3 L at home.  He was evaluated at urgent care and referred to emergency room because of increased work of breathing.  Pulse ox showing rates of 40s however with palpation and EKG is rate seems to be in mid 80s and grouped beating.  Denies lightheadedness, chest pain or palpitations.  Review of Systems  Positive: As above Negative: As above  Physical Exam  BP (!) 161/69 (BP Location: Right Arm)    Pulse (!) 43    Temp 97.8 F (36.6 C) (Oral)    Resp (!) 22    SpO2 100%  Gen:   Awake, no distress   Resp:  Normal effort  MSK:   Moves extremities without difficulty  Other:    Medical Decision Making  Medically screening exam initiated at 7:58 PM.  Appropriate orders placed.  Nathaniel Garcia was informed that the remainder of the evaluation will be completed by another provider, this initial triage assessment does not replace that evaluation, and the importance of remaining in the ED until their evaluation is complete.     Nathaniel [place], PA-C 10/29/21 2000    Nathaniel Nick, MD 11/01/21 [phone]

## 2021-10-29 NOTE — Telephone Encounter (Signed)
°  Chief Complaint: shortness of breath with exertion and pain in left shoulder with exertion, using albuterol inhaler more than prescribed Symptoms: shortness of breath with exertion going up stairs and c/o left shoulder pain , dizziness and lightheadedness Frequency: with exertion going up stairs  Pertinent Negatives: Patient denies chest pain, jaw pain, fever.  Disposition: [x] ED /[x] Urgent Care (no appt availability in office) / [] Appointment(In office/virtual)/ []  [facility]/ [] Home Care/ [] Refused Recommended Disposition  Additional Notes:  C/o SOB with exertion going up stairs, "can't catch breath" and uses albuterol inhaler every time going up stairs to use bathroom . C/o dizziness, lightheadedness, left shoulder pain x 2 weeks now. Instructed to go to ED . Called clinic to allow patient to talk with nurse and recommended UC / ED for symptoms. Patient agreed.

## 2021-10-29 NOTE — Telephone Encounter (Signed)
Reason for Disposition  [1] MILD difficulty breathing (e.g., minimal/no SOB at rest, SOB with walking, pulse <100) AND [2] NEW-onset or WORSE than normal  Answer Assessment - Initial Assessment Questions 1. RESPIRATORY STATUS: "Describe your breathing?" (e.g., wheezing, shortness of breath, unable to speak, severe coughing)      Breathing fine now. On O2 at 3 L.min [place] . With exertion c/o shortness of breath and can't catch breath.  2. ONSET: "When did this breathing problem begin?"      2 weeks ago  3. PATTERN "Does the difficult breathing come and go, or has it been constant since it started?"      Comes and goes with exertion 4. SEVERITY: "How bad is your breathing?" (e.g., mild, moderate, severe)    - MILD: No SOB at rest, mild SOB with walking, speaks normally in sentences, can lie down, no retractions, pulse < 100.    - MODERATE: SOB at rest, SOB with minimal exertion and prefers to sit, cannot lie down flat, speaks in phrases, mild retractions, audible wheezing, pulse 100-120.    - SEVERE: Very SOB at rest, speaks in single words, struggling to breathe, sitting hunched forward, retractions, pulse > 120      Moderate to severe with exertion walking up stairs and uses albuterol inhaler more than prescribed  5. RECURRENT SYMPTOM: "Have you had difficulty breathing before?" If Yes, ask: "When was the last time?" and "What happened that time?"      Yes . Uses albuterol inhaler more than prescribed every 3 days needs another refill 6. CARDIAC HISTORY: "Do you have any history of heart disease?" (e.g., heart attack, angina, bypass surgery, angioplasty)      See hx  7. LUNG HISTORY: "Do you have any history of lung disease?"  (e.g., pulmonary embolus, asthma, emphysema)     Hx COPD 8. CAUSE: "What do you think is causing the breathing problem?"      Not sure  9. OTHER SYMPTOMS: "Do you have any other symptoms? (e.g., dizziness, runny nose, cough, chest pain, fever)     None now , dizziness,  with exertion 10. O2 SATURATION MONITOR:  "Do you use an oxygen saturation monitor (pulse oximeter) at home?" If Yes, "What is your reading (oxygen level) today?" "What is your usual oxygen saturation reading?" (e.g., 95%)       O 2 at 3 L/ min [place] 11. PREGNANCY: "Is there any chance you are pregnant?" "When was your last menstrual period?"       na 12. TRAVEL: "Have you traveled out of the country in the last month?" (e.g., travel history, exposures)       na  Protocols used: Breathing Difficulty-A-AH

## 2021-10-29 NOTE — ED Notes (Signed)
[facility] contacted for transport.   Tried contacted [facility] Charge for report. No answer

## 2021-10-30 DIAGNOSIS — I4949 Other premature depolarization: Secondary | ICD-10-CM | POA: Diagnosis not present

## 2021-10-30 DIAGNOSIS — Z91199 Patient's noncompliance with other medical treatment and regimen due to unspecified reason: Secondary | ICD-10-CM

## 2021-10-30 DIAGNOSIS — J441 Chronic obstructive pulmonary disease with (acute) exacerbation: Secondary | ICD-10-CM | POA: Diagnosis not present

## 2021-10-30 DIAGNOSIS — R9431 Abnormal electrocardiogram [ECG] [EKG]: Secondary | ICD-10-CM

## 2021-10-30 DIAGNOSIS — J9621 Acute and chronic respiratory failure with hypoxia: Secondary | ICD-10-CM

## 2021-10-30 LAB — I-STAT VENOUS BLOOD GAS, ED
Acid-Base Excess: 2 mmol/L (ref 0.0–2.0)
Acid-Base Excess: 4 mmol/L — ABNORMAL HIGH (ref 0.0–2.0)
Bicarbonate: 28.9 mmol/L — ABNORMAL HIGH (ref 20.0–28.0)
Bicarbonate: 30.9 mmol/L — ABNORMAL HIGH (ref 20.0–28.0)
Calcium, Ion: 1.22 mmol/L (ref 1.15–1.40)
Calcium, Ion: 1.23 mmol/L (ref 1.15–1.40)
HCT: 40 % (ref 39.0–52.0)
HCT: 38 % — ABNORMAL LOW (ref 39.0–52.0)
Hemoglobin: 13.6 g/dL (ref 13.0–17.0)
Hemoglobin: 12.9 g/dL — ABNORMAL LOW (ref 13.0–17.0)
O2 Saturation: 41 %
O2 Saturation: 64 %
Potassium: 4.2 mmol/L (ref 3.5–5.1)
Potassium: 5.5 mmol/L — ABNORMAL HIGH (ref 3.5–5.1)
Sodium: 143 mmol/L (ref 135–145)
Sodium: 140 mmol/L (ref 135–145)
TCO2: 31 mmol/L (ref 22–32)
TCO2: 33 mmol/L — ABNORMAL HIGH (ref 22–32)
pCO2, Ven: 54.5 mmHg (ref 44.0–60.0)
pCO2, Ven: 55.4 mmHg (ref 44.0–60.0)
pH, Ven: 7.332 (ref 7.250–7.430)
pH, Ven: 7.354 (ref 7.250–7.430)
pO2, Ven: 26 mmHg — CL (ref 32.0–45.0)
pO2, Ven: 35 mmHg (ref 32.0–45.0)

## 2021-10-30 LAB — MAGNESIUM: Magnesium: 1.8 mg/dL (ref 1.7–2.4)

## 2021-10-30 LAB — TROPONIN I (HIGH SENSITIVITY)
Troponin I (High Sensitivity): 7 ng/L (ref <18)
Troponin I (High Sensitivity): 7 ng/L (ref <18)

## 2021-10-30 LAB — HIV ANTIBODY (ROUTINE TESTING W REFLEX): HIV Screen 4th Generation wRfx: NONREACTIVE

## 2021-10-30 MED ORDER — ALBUTEROL SULFATE (2.5 MG/3ML) 0.083% IN NEBU
2.5000 mg | INHALATION_SOLUTION | RESPIRATORY_TRACT | Status: DC
  Administered 2021-10-30 – 2021-10-31 (×3): 2.5 mg via RESPIRATORY_TRACT
  Filled 2021-10-30 (×3): qty 3

## 2021-10-30 MED ORDER — IPRATROPIUM-ALBUTEROL 0.5-2.5 (3) MG/3ML IN SOLN
9.0000 mL | Freq: Once | RESPIRATORY_TRACT | Status: AC
  Administered 2021-10-30: 10:00:00 9 mL via RESPIRATORY_TRACT
  Filled 2021-10-30: qty 3

## 2021-10-30 MED ORDER — ALBUTEROL SULFATE (2.5 MG/3ML) 0.083% IN NEBU
2.5000 mg | INHALATION_SOLUTION | RESPIRATORY_TRACT | Status: DC
Start: 2021-10-30 — End: 2021-10-30
  Administered 2021-10-30: 15:00:00 2.5 mg via RESPIRATORY_TRACT
  Filled 2021-10-30: qty 3

## 2021-10-30 MED ORDER — SERTRALINE HCL 25 MG PO TABS
25.0000 mg | ORAL_TABLET | Freq: Every day | ORAL | Status: DC
  Administered 2021-10-30 – 2021-10-31 (×2): 25 mg via ORAL
  Filled 2021-10-30 (×3): qty 1

## 2021-10-30 MED ORDER — METHYLPREDNISOLONE SODIUM SUCC 125 MG IJ SOLR
125.0000 mg | Freq: Once | INTRAMUSCULAR | Status: AC
  Administered 2021-10-30: 11:00:00 125 mg via INTRAVENOUS
  Filled 2021-10-30: qty 2

## 2021-10-30 MED ORDER — IPRATROPIUM-ALBUTEROL 0.5-2.5 (3) MG/3ML IN SOLN: 9.0000 mL | Freq: Once | RESPIRATORY_TRACT | Status: DC

## 2021-10-30 MED ORDER — TAMSULOSIN HCL 0.4 MG PO CAPS
0.4000 mg | ORAL_CAPSULE | Freq: Every day | ORAL | Status: DC
  Administered 2021-10-30 – 2021-11-01 (×3): 0.4 mg via ORAL
  Filled 2021-10-30 (×3): qty 1

## 2021-10-30 MED ORDER — FLUTICASONE FUROATE-VILANTEROL 100-25 MCG/ACT IN AEPB
1.0000 | INHALATION_SPRAY | Freq: Every day | RESPIRATORY_TRACT | Status: DC
  Administered 2021-10-30: 15:00:00 1 via RESPIRATORY_TRACT
  Filled 2021-10-30: qty 28

## 2021-10-30 MED ORDER — PROPRANOLOL HCL 10 MG PO TABS
10.0000 mg | ORAL_TABLET | Freq: Every morning | ORAL | Status: DC
  Administered 2021-10-30: 15:00:00 10 mg via ORAL
  Filled 2021-10-30: qty 1

## 2021-10-30 MED ORDER — ALBUTEROL SULFATE (2.5 MG/3ML) 0.083% IN NEBU
10.0000 mg/h | INHALATION_SOLUTION | RESPIRATORY_TRACT | Status: DC
  Administered 2021-10-30: 18:00:00 10 mg/h via RESPIRATORY_TRACT
  Filled 2021-10-30: qty 12

## 2021-10-30 MED ORDER — ATORVASTATIN CALCIUM 80 MG PO TABS
80.0000 mg | ORAL_TABLET | Freq: Every day | ORAL | Status: DC
  Administered 2021-10-30 – 2021-11-01 (×3): 80 mg via ORAL
  Filled 2021-10-30: qty 2
  Filled 2021-10-30 (×2): qty 1

## 2021-10-30 MED ORDER — ENOXAPARIN SODIUM 40 MG/0.4ML IJ SOSY
40.0000 mg | PREFILLED_SYRINGE | INTRAMUSCULAR | Status: DC
  Administered 2021-10-30 – 2021-10-31 (×2): 40 mg via SUBCUTANEOUS
  Filled 2021-10-30 (×2): qty 0.4

## 2021-10-30 MED ORDER — PREDNISONE 20 MG PO TABS: 40.0000 mg | ORAL_TABLET | Freq: Every day | ORAL | Status: DC

## 2021-10-30 MED ORDER — PREDNISONE 20 MG PO TABS: 60.0000 mg | ORAL_TABLET | Freq: Once | ORAL | Status: DC

## 2021-10-30 MED ORDER — MONTELUKAST SODIUM 10 MG PO TABS
10.0000 mg | ORAL_TABLET | Freq: Every day | ORAL | Status: DC
  Administered 2021-10-30 – 2021-10-31 (×2): 10 mg via ORAL
  Filled 2021-10-30 (×3): qty 1

## 2021-10-30 MED ORDER — ALBUTEROL SULFATE (2.5 MG/3ML) 0.083% IN NEBU
5.0000 mg | INHALATION_SOLUTION | Freq: Once | RESPIRATORY_TRACT | Status: AC
  Administered 2021-10-30: 12:00:00 5 mg via RESPIRATORY_TRACT
  Filled 2021-10-30: qty 6

## 2021-10-30 MED ORDER — CLOPIDOGREL BISULFATE 75 MG PO TABS
75.0000 mg | ORAL_TABLET | Freq: Every day | ORAL | Status: DC
  Administered 2021-10-30 – 2021-11-01 (×3): 75 mg via ORAL
  Filled 2021-10-30 (×3): qty 1

## 2021-10-30 MED ORDER — ALBUTEROL (5 MG/ML) CONTINUOUS INHALATION SOLN
10.0000 mg/h | INHALATION_SOLUTION | RESPIRATORY_TRACT | Status: DC
  Filled 2021-10-30: qty 0.5

## 2021-10-30 MED ORDER — METHYLPREDNISOLONE SODIUM SUCC 125 MG IJ SOLR
125.0000 mg | Freq: Once | INTRAMUSCULAR | Status: AC
  Administered 2021-10-31: 125 mg via INTRAVENOUS
  Filled 2021-10-30: qty 2

## 2021-10-30 NOTE — ED Provider Notes (Signed)
[facility] EMERGENCY DEPARTMENT Provider Note   CSN: [id number] Arrival date & time: 10/29/21  1751     History Chief Complaint  Patient presents with   Shortness of Breath    Aston Lempke is a 75 y.o. male with PMH COPD on home 3 L O2, previous CVA who presents the emergency department for evaluation of shortness of breath.  Patient states that over the last 2 to 3 days he has had significant worsening dyspnea with exertion and tachypnea.  He states that he has run out of his rescue albuterol inhaler.  He went to urgent care this morning who told the patient to come to the emergency department due to apparent chest pain.  Patient has no chest pain while here in the emergency department.  Initial presentation, patient maintained his oxygen saturations on his home O2 but has significant tachypnea with accessory muscle use and and expiratory wheezing.  Patient states that he does not have a long-acting inhaler that he takes daily.  Patient arrives in normal sinus rhythm but occasionally will have ventricular bigeminy on the monitor.   Shortness of Breath Associated symptoms: cough and wheezing   Associated symptoms: no abdominal pain, no chest pain, no ear pain, no fever, no rash, no sore throat and no vomiting       Past Medical History:  Diagnosis Date   COPD (chronic obstructive pulmonary disease) (HCC)    Hematuria    Hyperlipidemia    Stroke [facility])     Patient Active Problem List   Diagnosis Date Noted   COPD with acute exacerbation (HCC) 01/03/2021   Bullous emphysema (HCC) 01/03/2021   Acute exacerbation of chronic obstructive pulmonary disease (COPD) (HCC) 10/31/2020   COPD exacerbation (HCC) 10/29/2020   Acute respiratory failure with hypoxia (HCC) 10/29/2020   History of stroke 10/29/2020   Aortic atherosclerosis (HCC) 12/30/2018   Benign hematuria 09/18/2016   Weakness of left hand 07/02/2016   Lipid screening 01/03/2016   Cerebral  vascular disease 09/14/2015   Carotid artery narrowing 08/15/2015   Bilateral carotid artery stenosis 08/15/2015   Bradycardia 08/01/2015   H/O transient cerebral ischemia 08/01/2015   Combined fat and carbohydrate induced hyperlipemia 08/01/2015   Breathlessness on exertion 07/30/2015   Absolute anemia 04/03/2015   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   Cerebral vascular accident (HCC) 04/03/2015   HLD (hyperlipidemia) 04/03/2015   Blood glucose elevated 04/03/2015   Lung nodule, solitary 04/03/2015   Non compliance with medical treatment 04/03/2015   Lung nodule, multiple 04/03/2015   Cerebral infarction (HCC) 04/03/2015   Abnormal lung field 04/03/2015    Past Surgical History:  Procedure Laterality Date   COLONOSCOPY  2011   normal- MD docs       Family History  Problem Relation Age of Onset   Prostate cancer Neg Hx     Social History   Tobacco Use   Smoking status: Former    Packs/day: 0.02    Years: 60.00    Pack years: 1.20    Types: Cigarettes    Quit date: 10/26/2020    Years since quitting: 1.0   Smokeless tobacco: Never   Tobacco comments:    Page smoking cessation information provided  Vaping Use   Vaping Use: Never used  Substance Use Topics   Alcohol use: Yes    Alcohol/week: 2.0 standard drinks    Types: 2 Cans of beer per week    Comment: weekly   Drug use: Not Currently  Comment: marijuana    Home Medications Prior to Admission medications   Medication Sig Start Date End Date Taking? Authorizing Provider  albuterol (VENTOLIN HFA) 108 (90 Base) MCG/ACT inhaler Inhale 2 puffs into the lungs every 6 (six) hours as needed for wheezing or shortness of breath. 10/09/21  Yes Duanne Limerick, MD  atorvastatin (LIPITOR) 80 MG tablet Take 1 tablet (80 mg total) by mouth daily. 10/09/21  Yes Duanne Limerick, MD  budesonide (PULMICORT) 0.5 MG/2ML nebulizer solution Take 0.5 mg by nebulization daily as needed. 09/18/21  Yes [provider]  clopidogrel (PLAVIX) 75 MG tablet TAKE 1 TABLET(75 MG) BY MOUTH DAILY Patient taking differently: Take 75 mg by mouth daily. 10/09/21  Yes Duanne Limerick, MD  ipratropium-albuterol (DUONEB) 0.5-2.5 (3) MG/3ML SOLN Take 3 mLs by nebulization 4 (four) times daily. 09/18/21  Yes [provider]  predniSONE (DELTASONE) 5 MG tablet Take 5 mg by mouth daily. 08/07/21  Yes [provider]  propranolol (INDERAL) 10 MG tablet Take 1 tablet (10 mg total) by mouth every morning. 10/09/21  Yes Duanne Limerick, MD  sertraline (ZOLOFT) 25 MG tablet Take 1 tablet (25 mg total) by mouth at bedtime. 10/09/21  Yes Duanne Limerick, MD  tamsulosin (FLOMAX) 0.4 MG CAPS capsule TAKE 1 CAPSULE(0.4 MG) BY MOUTH DAILY Patient taking differently: Take 0.4 mg by mouth daily. 08/12/21  Yes McGowan, Carollee Herter A, PA-C  montelukast (SINGULAIR) 10 MG tablet Take 1 tablet (10 mg total) by mouth at bedtime. 10/09/21   Duanne Limerick, MD  nicotine (NICODERM CQ - DOSED IN MG/24 HOURS) 14 mg/24hr patch Place 1 patch (14 mg total) onto the skin daily. Patient not taking: Reported on 10/09/2021 09/05/21   Joseph Art, DO    Allergies    Hctz [hydrochlorothiazide]  Review of Systems   Review of Systems  Constitutional:  Negative for chills and fever.  HENT:  Negative for ear pain and sore throat.   Eyes:  Negative for pain and visual disturbance.  Respiratory:  Positive for cough, shortness of breath and wheezing.   Cardiovascular:  Negative for chest pain and palpitations.  Gastrointestinal:  Negative for abdominal pain and vomiting.  Genitourinary:  Negative for dysuria and hematuria.  Musculoskeletal:  Negative for arthralgias and back pain.  Skin:  Negative for color change and rash.  Neurological:  Negative for seizures and syncope.  All other systems reviewed and are negative.  Physical Exam Updated Vital Signs BP (!) 146/70    Pulse (!) 42    Temp (!) 97.5 F (36.4 C) (Oral)     Resp (!) 27    Ht 6' (1.829 m)    Wt 63.5 kg    SpO2 100%    BMI 18.99 kg/m   Physical Exam Constitutional:      General: He is not in acute distress.    Appearance: Normal appearance.  HENT:     Head: Normocephalic and atraumatic.     Nose: No congestion or rhinorrhea.  Eyes:     General:        Right eye: No discharge.        Left eye: No discharge.     Extraocular Movements: Extraocular movements intact.     Pupils: Pupils are equal, round, and reactive to light.  Cardiovascular:     Rate and Rhythm: Normal rate and regular rhythm.     Heart sounds: No murmur heard. Pulmonary:     Effort: Tachypnea  and accessory muscle usage present. No respiratory distress.     Breath sounds: Wheezing present. No rales.  Abdominal:     General: There is no distension.     Tenderness: There is no abdominal tenderness.  Musculoskeletal:        General: Normal range of motion.     Cervical back: Normal range of motion.  Skin:    General: Skin is warm and dry.  Neurological:     General: No focal deficit present.     Mental Status: He is alert.    ED Results / Procedures / Treatments   Labs (all labs ordered are listed, but only abnormal results are displayed) Labs Reviewed  CBC WITH DIFFERENTIAL/PLATELET - Abnormal; Notable for the following components:      Result Value   RBC 3.81 (*)    MCV 103.4 (*)    MCH 34.4 (*)    All other components within normal limits  BASIC METABOLIC PANEL - Abnormal; Notable for the following components:   Calcium 8.7 (*)    All other components within normal limits  I-STAT VENOUS BLOOD GAS, ED - Abnormal; Notable for the following components:   pO2, Ven 26.0 (*)    Bicarbonate 28.9 (*)    All other components within normal limits  RESP PANEL BY RT-PCR (FLU A&B, COVID) ARPGX2  BLOOD GAS, VENOUS  TROPONIN I (HIGH SENSITIVITY)  TROPONIN I (HIGH SENSITIVITY)    EKG None  Radiology DG Chest 2 View  Result Date: 10/29/2021 CLINICAL DATA:   Dyspnea EXAM: CHEST - 2 VIEW COMPARISON:  08/27/2021 FINDINGS: Hyperinflation with emphysema. No focal opacity, pleural effusion or pneumothorax. Normal cardiomediastinal silhouette. IMPRESSION: No active cardiopulmonary disease.  Emphysema Electronically Signed   By: Donavan Foil M.D.   On: 10/29/2021 20:37    Procedures .Critical Care Performed by: Teressa Lower, MD Authorized by: Teressa Lower, MD   Critical care provider statement:    Critical care time (minutes):  30   Critical care was necessary to treat or prevent imminent or life-threatening deterioration of the following conditions:  Respiratory failure   Critical care was time spent personally by me on the following activities:  Development of treatment plan with patient or surrogate, discussions with consultants, evaluation of patient's response to treatment, examination of patient, ordering and review of laboratory studies, ordering and review of radiographic studies, ordering and performing treatments and interventions, pulse oximetry, re-evaluation of patient's condition and review of old charts   Medications Ordered in ED Medications  methylPREDNISolone sodium succinate (SOLU-MEDROL) 125 mg/2 mL injection 125 mg (125 mg Intravenous Given 10/30/21 1040)  ipratropium-albuterol (DUONEB) 0.5-2.5 (3) MG/3ML nebulizer solution 9 mL (9 mLs Nebulization Given 10/30/21 1028)  albuterol (PROVENTIL) (2.5 MG/3ML) 0.083% nebulizer solution 5 mg (5 mg Nebulization Given 10/30/21 1219)    ED Course  I have reviewed the triage vital signs and the nursing notes.  Pertinent labs & imaging results that were available during my care of the patient were reviewed by me and considered in my medical decision making (see chart for details).    MDM Rules/Calculators/A&P                           Patient seen emergency department for evaluation of shortness of breath and cough.  Physical exam reveals tachypnea and expiratory wheezing concerning  for COPD exacerbation.  Laboratory evaluation largely unremarkable, VBG with a pH of 7.33 with a PCO2 of 54.5.  Troponin unremarkable.  ECG with ventricular bigeminy but no ST elevations or depressions.  This ventricular bigeminy will self abort here in the emergency department.  Patient received 3 back-to-back duo's which led to mild improvement of his wheezing but he remained tachypneic.  He then received 2 additional albuterol treatments that also led to progressive mild improvement but his tachypnea remains.  The patient will likely be safe for the floor with every 4 albuterol's but is not safe to leave the emergency department due to his persistent shortness of breath and COPD exacerbation.  Patient would benefit from a long-acting inhalers and associated education around this.  Patient then admitted to the family medicine service. Final Clinical Impression(s) / ED Diagnoses Final diagnoses:  COPD exacerbation [facility])    Rx / DC Orders ED Discharge Orders     None        Ismael Treptow, MD 10/30/21 1326

## 2021-10-30 NOTE — ED Notes (Signed)
Message Dr. Jennette Kettle to inform her that pt c/o increased difficulty breathing and states that he feels he could benefit from another albuterol tx.

## 2021-10-30 NOTE — Progress Notes (Signed)
FPTS Interim Progress Note  S: Contacted by RN that patient is having increased difficulty breathing and respiratory rate in the 30s to 40s.  Patient reports he is having increased difficulty getting breath in and feels that he is working harder to breathe.  O: BP 140/69    Pulse 62    Temp (!) 97.5 F (36.4 C) (Oral)    Resp (!) 26    Ht 6' (1.829 m)    Wt 63.5 kg    SpO2 100%    BMI 18.99 kg/m   General: Uncomfortable appearing but capable in conversation Pulmonary: Expiratory wheezing, increased work of breathing with belly breathing and neck retractions, nasal cannula in place  A/P: COPD acute exacerbation Patient sounds better presentation with expiratory wheezing greatly improved from no audible breath sounds prior.  However, he is notably working harder to breathe.  O2 sats 100 on 3 L, decreased to 2 while in room and still satting well.  Contacted respiratory team to evaluate patient further.  -Changed albuterol nebulizers to every 2 hours -Continuous albuterol therapy x1 hour -Consider BiPAP and transitioning to progressive care depending on benefit of CAT and reevaluation by night team in 1 hour  Shelby Mattocks, DO 10/30/2021, 6:16 PM PGY-1, [facility] Family Medicine Service pager [phone]

## 2021-10-30 NOTE — ED Notes (Signed)
Pt refused to get into gown at this time.

## 2021-10-30 NOTE — ED Notes (Addendum)
Pt on BiPap with continuous neb at this time trial for 1 hour verbal order from Cresenzo, MD. MD orders to contact him for progressive bed order if BiPap effective.

## 2021-10-30 NOTE — ED Notes (Signed)
Pt given water and [place] sandwich bag per Kommor, MD.

## 2021-10-30 NOTE — Consult Note (Signed)
Cardiology Consultation:   Patient ID: Nathaniel Garcia MRN: [id number]; DOB: 1946/07/09  Admit date: 10/29/2021 Date of Consult: 10/30/2021  PCP:  Juline Patch, MD   [facility] Providers Cardiologist: None :1}     Patient Profile:   Nathaniel Garcia is a 75 y.o. male with a hx of stage IV COPD who is being seen 10/30/2021 for the evaluation of irregular and slow heart rhythm at the request of Dr. Caron Presume.  History of Present Illness:   Nathaniel Garcia is short of breath currently.  He tells me he has stage IV COPD.  No prior history of cardiac problems but has had a previous stroke, has hyperlipidemia, and on occasion has hematuria.  He denies atrial fibrillation.  No prior cardiology notes are available in [place].  He was recently started on very low-dose propranolol for tremor.  He denies chest pain, has not experienced palpitations, syncope, lower extremity swelling, orthopnea, or PND.   Past Medical History:  Diagnosis Date   COPD (chronic obstructive pulmonary disease) ([place])    Hematuria    Hyperlipidemia    Stroke [facility])     Past Surgical History:  Procedure Laterality Date   COLONOSCOPY  2011   normal- MD docs     Home Medications:  Prior to Admission medications   Medication Sig Start Date End Date Taking? Authorizing Provider  albuterol (VENTOLIN HFA) 108 (90 Base) MCG/ACT inhaler Inhale 2 puffs into the lungs every 6 (six) hours as needed for wheezing or shortness of breath. 10/09/21  Yes Juline Patch, MD  atorvastatin (LIPITOR) 80 MG tablet Take 1 tablet (80 mg total) by mouth daily. 10/09/21  Yes Juline Patch, MD  budesonide (PULMICORT) 0.5 MG/2ML nebulizer solution Take 0.5 mg by nebulization daily as needed. 09/18/21  Yes [provider]  clopidogrel (PLAVIX) 75 MG tablet TAKE 1 TABLET(75 MG) BY MOUTH DAILY Patient taking differently: Take 75 mg by mouth daily. 10/09/21  Yes Juline Patch, MD  ipratropium-albuterol (DUONEB)  0.5-2.5 (3) MG/3ML SOLN Take 3 mLs by nebulization 4 (four) times daily. 09/18/21  Yes [provider]  predniSONE (DELTASONE) 5 MG tablet Take 5 mg by mouth daily. 08/07/21  Yes [provider]  propranolol (INDERAL) 10 MG tablet Take 1 tablet (10 mg total) by mouth every morning. 10/09/21  Yes Juline Patch, MD  sertraline (ZOLOFT) 25 MG tablet Take 1 tablet (25 mg total) by mouth at bedtime. 10/09/21  Yes Juline Patch, MD  tamsulosin (FLOMAX) 0.4 MG CAPS capsule TAKE 1 CAPSULE(0.4 MG) BY MOUTH DAILY Patient taking differently: Take 0.4 mg by mouth daily. 08/12/21  Yes McGowan, Larene [place] A, PA-C  montelukast (SINGULAIR) 10 MG tablet Take 1 tablet (10 mg total) by mouth at bedtime. 10/09/21   Juline Patch, MD  nicotine (NICODERM CQ - DOSED IN MG/24 HOURS) 14 mg/24hr patch Place 1 patch (14 mg total) onto the skin daily. Patient not taking: Reported on 10/09/2021 09/05/21   Geradine Girt, DO    Inpatient Medications: Scheduled Meds:  albuterol  2.5 mg Nebulization Q4H   atorvastatin  80 mg Oral Daily   clopidogrel  75 mg Oral Daily   enoxaparin (LOVENOX) injection  40 mg Subcutaneous Q24H   fluticasone furoate-vilanterol  1 puff Inhalation Daily   [START ON 10/31/2021] methylPREDNISolone (SOLU-MEDROL) injection  125 mg Intravenous Once   montelukast  10 mg Oral QHS   propranolol  10 mg Oral q morning   sertraline  25  mg Oral QHS   tamsulosin  0.4 mg Oral Daily   Continuous Infusions:  PRN Meds:   Allergies:    Allergies  Allergen Reactions   Hctz [Hydrochlorothiazide] Rash    Social History:   Noncontributory.Marland Kitchen  He is married.   Family History:    Family History  Problem Relation Age of Onset   Prostate cancer Neg Hx      ROS:  Please see the history of present illness.  Frightened.  Quite short of breath currently. All other ROS reviewed and negative.     Physical Exam/Data:   Vitals:   10/30/21 1507 10/30/21 1509 10/30/21 1545 10/30/21  1645  BP: (!) 125/53  124/68 (!) 143/77  Pulse: (!) 42 (!) 43 64 60  Resp: (!) 21 (!) 22 (!) 25 (!) 24  Temp:      TempSrc:      SpO2: 100% 100% 100% 100%  Weight:      Height:       No intake or output data in the 24 hours ending 10/30/21 1708 Last 3 Weights 10/30/2021 10/09/2021 09/12/2021  Weight (lbs) 140 lb 132 lb 126 lb 14.4 oz  Weight (kg) 63.504 kg 59.875 kg 57.561 kg     Body mass index is 18.99 kg/m.  General:  Well nourished, well developed, but noted to be using accessory muscles to breathe and is clearly dyspneic and tachypneic. HEENT: normal Neck: no JVD Vascular: No carotid bruits; Distal pulses 2+ bilaterally Cardiac:  normal S1, S2; RRR; no murmur heart tones are distant. Lungs:  clear to auscultation bilaterally, no wheezing, rhonchi or rales  Abd: soft, nontender, no hepatomegaly  Ext: no edema Musculoskeletal:  No deformities, BUE and BLE strength normal and equal Skin: warm and dry  Neuro:  CNs 2-12 intact, no focal abnormalities noted Psych:  Normal affect   EKG:  The EKG was personally reviewed and demonstrates: The tracing performed at 1508 on 10/30/2021 demonstrates sinus rhythm with short PR interval and bradycardia at 44 bpm.  There is suggestion of blocked P waves noted in the T waves.  No acute ST-T wave changes noted.  The EKG performed on 10/29/2021 at 1702 reveals baseline artifact, sinus rhythm, with PACs normally conducted.  A tracing performed on 10/29/2021 at 1805, demonstrates sinus rhythm with atrial bigeminy. Telemetry:  Telemetry was personally reviewed and demonstrates: Normal sinus rhythm, PACs, occasional atrial bigeminy, occasional atrial bigeminy with blocked PACs producing bradycardia with heart rates in the 40s.  No atrial fibrillation is noted.  No ventricular ectopy is noted.  Relevant CV Studies: No cardiac studies are available  Laboratory Data:  High Sensitivity Troponin:   Recent Labs  Lab 10/30/21 0959 10/30/21 1144   TROPONINIHS 7 7     Chemistry Recent Labs  Lab 10/29/21 2006 10/30/21 1005  NA 140 143  K 3.7 4.2  CL 106  --   CO2 27  --   GLUCOSE 98  --   BUN 12  --   CREATININE 1.20  --   CALCIUM 8.7*  --   GFRNONAA >60  --   ANIONGAP 7  --     No results for input(s): PROT, ALBUMIN, AST, ALT, ALKPHOS, BILITOT in the last 168 hours. Lipids No results for input(s): CHOL, TRIG, HDL, LABVLDL, LDLCALC, CHOLHDL in the last 168 hours.  Hematology Recent Labs  Lab 10/29/21 2006 10/30/21 1005  WBC 8.3  --   RBC 3.81*  --   HGB 13.1 13.6  HCT 39.4 40.0  MCV 103.4*  --   MCH 34.4*  --   MCHC 33.2  --   RDW 12.7  --   PLT 229  --    Thyroid No results for input(s): TSH, FREET4 in the last 168 hours.  BNPNo results for input(s): BNP, PROBNP in the last 168 hours.  DDimer No results for input(s): DDIMER in the last 168 hours.   Radiology/Studies:  DG Chest 2 View  Result Date: 10/29/2021 CLINICAL DATA:  Dyspnea EXAM: CHEST - 2 VIEW COMPARISON:  08/27/2021 FINDINGS: Hyperinflation with emphysema. No focal opacity, pleural effusion or pneumothorax. Normal cardiomediastinal silhouette. IMPRESSION: No active cardiopulmonary disease.  Emphysema Electronically Signed   By: Donavan Foil M.D.   On: 10/29/2021 20:37     Assessment and Plan:   COPD exacerbation.  Consider discontinuing propranolol given severity of his lung disease. Atrial premature contractions with atrial bigeminy and on occasion blocked PACs producing bradycardia with heart rates in the 40s. This is not an acute rhythm disturbance.  The beta-blocker is not contributing to this however I would consider discontinuation of even the low-dose propranolol given the severity of his COPD.        :[id number]          [facility] will sign off.   Medication Recommendations: Consider discontinuation of propranolol Other recommendations (labs, testing, etc): Telemetry Follow up as an outpatient: Not necessary  For questions or  updates, please contact [place] Please consult [email or web address] for contact info under    Signed, Sinclair Grooms, MD  10/30/2021 5:08 PM

## 2021-10-30 NOTE — ED Notes (Signed)
Dr.Kommor made aware of VBG lab results. ED-Lab

## 2021-10-30 NOTE — ED Notes (Signed)
Messaged Dr. Jennette Kettle again to inform her that pt has called out again for increased difficulty breathing. Pts SpO2 100%. Respiratory rate 30s-40s. MD aware.

## 2021-10-30 NOTE — H&P (Addendum)
[facility] Admission History and Physical Service Pager: [phone]  Patient name: Nathaniel Garcia Medical record number: [id number] Date of birth: 1946-05-15 Age: 75 y.o. Gender: male  Primary Care Provider: Juline Patch, MD Consultants: None Code Status: DNI  Preferred Emergency Contact: Astrid Drafts [phone]  Chief Complaint: Dyspnea   Assessment and Plan: Nathaniel Garcia is a 75 y.o. male presenting with shortness of breath. PMH is significant for COPD on chronic prednisone therapy, hx of CVA, HLD, BPH, Anxiety.   COPD Acute Exacerbation On arrival, patient was tachypneic and had increased WOB with marked wheezing. VBG was notable for pO2 26.0. pH 7.33, pCO2 54.5. Troponins were 7>7. ECG revealed ventricular bigeminy without ST elevations or depressions. CXR with no active cardiopulmonary disease, but with emphysema.Marland Kitchen He was plaved on supplemental O2 and received albuterol, duoneb x3, and solumedrol with improvement in his WOB. He required up to 5L supplemental O2. (Up from 3L at home). Patient meeting 1 out of 3 cardinal symptoms (dyspnea), therefore no indication for antibiotics at this time. No increased cough or sputum production. To my exam remains with poor air movement, prolonged expiratory phase, and diffuse wheeze. Work of breathing has normalized. O2 sats on monitor in mid-high 90s.  Patient is a poor historian and it is difficult to ascertain exactly what he is taking at home, but per chart review it seems he is supposed to be using prednisone 5mg  daily, singulair 10mg  daily, Duonebs four times daily, and Pulmicort inhalers PRN in addition to his albuterol inhaler.  - Admit to med-tele, Dr. Nori Riis attending - Albuterol nebulizers q4h - Breo Ellipta 1 puff daily - Solumedrol 125mg  redose tomorrow then transition to prednisone - Singulair 10mg  QHS - Incentive spirometer  - PT/OT/RT consults - Supplemental O2 for sats 88-92%    Bigeminy noted on ECG and Cardiac Monitor Per ED provider note, ventricular bigeminy noted on admission self-aborted earlier in the day, but during my interview, rhythm on cardiac monitor noted to be multimorphic, consistent with recurrent ventricular bigeminy. Does not seem to be contributing to acute symptoms. Likely a chronic issue given no chest pain and flat troponins, but will monitor. Of note, patient is already on a beta-blocker, Propranolol, for essential tremor.  Repeat EKG showing junctional rhythm. -Due to acute changes in heart rhythm we will consult cardiology for further evaluation - Cardiac monitors - Continue propranolol 10mg  daily  Hx of CVA No persistent neurologic deficits. - Continue atorvastatin 80mg  daily - Plavix 75mg  daily  Anxiety - Continue Sertraline 25mg  QHS  Essential Tremor Well-controlled. - Continue home propranolol 10mg  daily  BPH, chronic Patient endorses urinary frequency. He has also been undergoing workup for microscopic hematuria, but CT last week showed no signs of nephrolithiasis or upper tract lesion and no secondary sign of stricture or suspicious renal lesion. Assessment of the bladder was limited due to under distention.  - Continue Flomax 0.4mg  daily  FEN/GI: Regular diet Prophylaxis: Lovenox  Disposition: Med-Tele  History of Present Illness:  Nathaniel Garcia is a 75 y.o. male presenting with three days of worsening dyspnea. Per his sister, he has had shallow breathing and decreasing exercise tolerance since Sunday and has been using his albuterol inhaler "all the time." Denies any increased cough, sputum production, or URI symptoms. He was directed to come to the ED by his PCP after running through an entire inhaler in two days. He presented to UC and was transferred to [facility] due to frequent PACs and  occassional PVCs along with some nonspecific ST changes that were new from prior EKGs. Breathing has improved since receiving  albuterol and duoneb x3 in ED.   He says that he cannot climb even one set of stairs without having to stop and use his inhaler.  Has been on home oxygen since his last hospitalization in October of this year. This is his third admission for COPD exacerbation in one year. Per chart review, he does follow with [facility]. Their most recent notes suggest poor adherence with prednisone Rx.   He also endorses sharp pain in his left shoulder for one week, though it has since resolved and is not currently bothering him. No trauma.    Review Of Systems: Per HPI with the following additions:   Review of Systems  Constitutional:  Negative for chills and fever.  Respiratory:  Positive for chest tightness, shortness of breath and wheezing. Negative for cough.   Gastrointestinal:  Negative for abdominal pain, diarrhea, nausea and vomiting.  Musculoskeletal:  Positive for arthralgias (L shoulder).  All other systems reviewed and are negative.   Patient Active Problem List   Diagnosis Date Noted   COPD with acute exacerbation (HCC) 01/03/2021   Bullous emphysema (HCC) 01/03/2021   Acute exacerbation of chronic obstructive pulmonary disease (COPD) (HCC) 10/31/2020   COPD exacerbation (HCC) 10/29/2020   Acute respiratory failure with hypoxia (HCC) 10/29/2020   History of stroke 10/29/2020   Aortic atherosclerosis (HCC) 12/30/2018   Benign hematuria 09/18/2016   Weakness of left hand 07/02/2016   Lipid screening 01/03/2016   Cerebral vascular disease 09/14/2015   Carotid artery narrowing 08/15/2015   Bilateral carotid artery stenosis 08/15/2015   Bradycardia 08/01/2015   H/O transient cerebral ischemia 08/01/2015   Combined fat and carbohydrate induced hyperlipemia 08/01/2015   Breathlessness on exertion 07/30/2015   Absolute anemia 04/03/2015   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   Cerebral vascular accident (HCC) 04/03/2015   HLD (hyperlipidemia) 04/03/2015   Blood glucose elevated  04/03/2015   Lung nodule, solitary 04/03/2015   Non compliance with medical treatment 04/03/2015   Lung nodule, multiple 04/03/2015   Cerebral infarction (HCC) 04/03/2015   Abnormal lung field 04/03/2015    Past Medical History: Past Medical History:  Diagnosis Date   COPD (chronic obstructive pulmonary disease) (HCC)    Hematuria    Hyperlipidemia    Stroke [facility])     Past Surgical History: Past Surgical History:  Procedure Laterality Date   COLONOSCOPY  2011   normal- MD docs    Social History: Social History   Tobacco Use   Smoking status: Former    Packs/day: 0.02    Years: 60.00    Pack years: 1.20    Types: Cigarettes    Quit date: 10/26/2020    Years since quitting: 1.0   Smokeless tobacco: Never   Tobacco comments:     smoking cessation information provided  Vaping Use   Vaping Use: Never used  Substance Use Topics   Alcohol use: Yes    Alcohol/week: 2.0 standard drinks    Types: 2 Cans of beer per week    Comment: weekly   Drug use: Not Currently    Comment: marijuana   Additional social history: Quit smoking earlier this year. Currently drinks one 12 oz [employer] nightly Please also refer to relevant sections of EMR.  Family History: Family History  Problem Relation Age of Onset   Prostate cancer Neg Hx     Allergies and Medications:  Allergies  Allergen Reactions   Hctz [Hydrochlorothiazide] Rash   No current facility-administered medications on file prior to encounter.   Current Outpatient Medications on File Prior to Encounter  Medication Sig Dispense Refill   albuterol (VENTOLIN HFA) 108 (90 Base) MCG/ACT inhaler Inhale 2 puffs into the lungs every 6 (six) hours as needed for wheezing or shortness of breath. 6.7 g 11   atorvastatin (LIPITOR) 80 MG tablet Take 1 tablet (80 mg total) by mouth daily. 90 tablet 1   budesonide (PULMICORT) 0.5 MG/2ML nebulizer solution Take 0.5 mg by nebulization daily as needed.     clopidogrel  (PLAVIX) 75 MG tablet TAKE 1 TABLET(75 MG) BY MOUTH DAILY (Patient taking differently: Take 75 mg by mouth daily.) 90 tablet 1   ipratropium-albuterol (DUONEB) 0.5-2.5 (3) MG/3ML SOLN Take 3 mLs by nebulization 4 (four) times daily.     predniSONE (DELTASONE) 5 MG tablet Take 5 mg by mouth daily.     propranolol (INDERAL) 10 MG tablet Take 1 tablet (10 mg total) by mouth every morning. 90 tablet 1   sertraline (ZOLOFT) 25 MG tablet Take 1 tablet (25 mg total) by mouth at bedtime. 90 tablet 1   tamsulosin (FLOMAX) 0.4 MG CAPS capsule TAKE 1 CAPSULE(0.4 MG) BY MOUTH DAILY (Patient taking differently: Take 0.4 mg by mouth daily.) 90 capsule 3   montelukast (SINGULAIR) 10 MG tablet Take 1 tablet (10 mg total) by mouth at bedtime. 90 tablet 1   nicotine (NICODERM CQ - DOSED IN MG/24 HOURS) 14 mg/24hr patch Place 1 patch (14 mg total) onto the skin daily. (Patient not taking: Reported on 10/09/2021) 14 patch 0    Objective: BP (!) 146/70    Pulse (!) 42    Temp (!) 97.5 F (36.4 C) (Oral)    Resp (!) 27    Ht 6' (1.829 m)    Wt 63.5 kg    SpO2 100%    BMI 18.99 kg/m  Exam: General: Thin older man, Nasal cannula in place, NAD Eyes: EOMs intact, sclerae anicteric ENTM: Mucous membranes moist Neck: Supple, no thyromegaly Cardiovascular: Regularly irregular, normal rate, pulses nl Respiratory: Normal WOB on 5L [place], very poor air movement, prolonged expiratory phase, diffuse wheeze, worse in bases Gastrointestinal: Soft, non-tender, non-distended MSK: No edema or deformity. L shoulder non-tender to plpation Derm: Warm and dry Neuro: No focal deficits Psych: Mood and affect nl   Labs and Imaging: CBC BMET  Recent Labs  Lab 10/29/21 2006 10/30/21 1005  WBC 8.3  --   HGB 13.1 13.6  HCT 39.4 40.0  PLT 229  --    Recent Labs  Lab 10/29/21 2006 10/30/21 1005  NA 140 143  K 3.7 4.2  CL 106  --   CO2 27  --   BUN 12  --   CREATININE 1.20  --   GLUCOSE 98  --   CALCIUM 8.7*  --       EKG: Sinus rhythm, premature narrow-QRS complex in bigeminy pattern, no ST elevation or depression. QTc 454.   DG Chest 2 View CLINICAL DATA:  Dyspnea  EXAM: CHEST - 2 VIEW  COMPARISON:  08/27/2021  FINDINGS: Hyperinflation with emphysema. No focal opacity, pleural effusion or pneumothorax. Normal cardiomediastinal silhouette.  IMPRESSION: No active cardiopulmonary disease.  Emphysema  Electronically Signed   By: Donavan Foil M.D.   On: 10/29/2021 20:37   Gifford Shave, MD 10/30/2021, 1:14 PM PGY-1, [place] Intern pager: [phone], text pages  welcome

## 2021-10-31 DIAGNOSIS — Z8673 Personal history of transient ischemic attack (TIA), and cerebral infarction without residual deficits: Secondary | ICD-10-CM | POA: Diagnosis not present

## 2021-10-31 DIAGNOSIS — Z7952 Long term (current) use of systemic steroids: Secondary | ICD-10-CM | POA: Diagnosis not present

## 2021-10-31 DIAGNOSIS — J441 Chronic obstructive pulmonary disease with (acute) exacerbation: Secondary | ICD-10-CM | POA: Diagnosis not present

## 2021-10-31 DIAGNOSIS — G250 Essential tremor: Secondary | ICD-10-CM | POA: Diagnosis not present

## 2021-10-31 DIAGNOSIS — R0602 Shortness of breath: Secondary | ICD-10-CM | POA: Diagnosis not present

## 2021-10-31 DIAGNOSIS — I700 Atherosclerosis of aorta: Secondary | ICD-10-CM | POA: Diagnosis not present

## 2021-10-31 DIAGNOSIS — Z7951 Long term (current) use of inhaled steroids: Secondary | ICD-10-CM | POA: Diagnosis not present

## 2021-10-31 DIAGNOSIS — I493 Ventricular premature depolarization: Secondary | ICD-10-CM | POA: Diagnosis not present

## 2021-10-31 DIAGNOSIS — F419 Anxiety disorder, unspecified: Secondary | ICD-10-CM | POA: Diagnosis not present

## 2021-10-31 DIAGNOSIS — N401 Enlarged prostate with lower urinary tract symptoms: Secondary | ICD-10-CM | POA: Diagnosis not present

## 2021-10-31 DIAGNOSIS — Z7902 Long term (current) use of antithrombotics/antiplatelets: Secondary | ICD-10-CM | POA: Diagnosis not present

## 2021-10-31 DIAGNOSIS — Z9981 Dependence on supplemental oxygen: Secondary | ICD-10-CM | POA: Diagnosis not present

## 2021-10-31 DIAGNOSIS — J9621 Acute and chronic respiratory failure with hypoxia: Secondary | ICD-10-CM | POA: Diagnosis not present

## 2021-10-31 DIAGNOSIS — I4949 Other premature depolarization: Secondary | ICD-10-CM | POA: Diagnosis not present

## 2021-10-31 DIAGNOSIS — R350 Frequency of micturition: Secondary | ICD-10-CM | POA: Diagnosis not present

## 2021-10-31 DIAGNOSIS — E871 Hypo-osmolality and hyponatremia: Secondary | ICD-10-CM | POA: Diagnosis not present

## 2021-10-31 DIAGNOSIS — I491 Atrial premature depolarization: Secondary | ICD-10-CM | POA: Diagnosis not present

## 2021-10-31 DIAGNOSIS — Z79899 Other long term (current) drug therapy: Secondary | ICD-10-CM | POA: Diagnosis not present

## 2021-10-31 DIAGNOSIS — Z20822 Contact with and (suspected) exposure to covid-19: Secondary | ICD-10-CM | POA: Diagnosis not present

## 2021-10-31 DIAGNOSIS — Z91199 Patient's noncompliance with other medical treatment and regimen due to unspecified reason: Secondary | ICD-10-CM | POA: Diagnosis not present

## 2021-10-31 DIAGNOSIS — E785 Hyperlipidemia, unspecified: Secondary | ICD-10-CM | POA: Diagnosis not present

## 2021-10-31 DIAGNOSIS — J439 Emphysema, unspecified: Secondary | ICD-10-CM | POA: Diagnosis not present

## 2021-10-31 LAB — BLOOD GAS, VENOUS
Acid-Base Excess: 0.4 mmol/L (ref 0.0–2.0)
Bicarbonate: 25.3 mmol/L (ref 20.0–28.0)
O2 Saturation: 90.8 %
Patient temperature: 37
pCO2, Ven: 46 mmHg (ref 44.0–60.0)
pH, Ven: 7.359 (ref 7.250–7.430)
pO2, Ven: 64.1 mmHg — ABNORMAL HIGH (ref 32.0–45.0)

## 2021-10-31 LAB — BASIC METABOLIC PANEL
Anion gap: 6 (ref 5–15)
BUN: 20 mg/dL (ref 8–23)
CO2: 23 mmol/L (ref 22–32)
Calcium: 8.5 mg/dL — ABNORMAL LOW (ref 8.9–10.3)
Chloride: 105 mmol/L (ref 98–111)
Creatinine, Ser: 1.28 mg/dL — ABNORMAL HIGH (ref 0.61–1.24)
GFR, Estimated: 58 mL/min — ABNORMAL LOW (ref >60)
Glucose, Bld: 131 mg/dL — ABNORMAL HIGH (ref 70–99)
Potassium: 4.3 mmol/L (ref 3.5–5.1)
Sodium: 134 mmol/L — ABNORMAL LOW (ref 135–145)

## 2021-10-31 MED ORDER — IPRATROPIUM-ALBUTEROL 0.5-2.5 (3) MG/3ML IN SOLN
3.0000 mL | RESPIRATORY_TRACT | Status: DC
Start: 2021-10-31 — End: 2021-11-01
  Administered 2021-10-31 (×6): 3 mL via RESPIRATORY_TRACT
  Filled 2021-10-31 (×6): qty 3

## 2021-10-31 MED ORDER — IPRATROPIUM-ALBUTEROL 0.5-2.5 (3) MG/3ML IN SOLN: 3.0000 mL | RESPIRATORY_TRACT | Status: DC | PRN

## 2021-10-31 MED ORDER — PREDNISONE 20 MG PO TABS
40.0000 mg | ORAL_TABLET | Freq: Every day | ORAL | Status: DC
  Administered 2021-11-01: 40 mg via ORAL
  Filled 2021-10-31: qty 2

## 2021-10-31 MED ORDER — BUDESONIDE 0.25 MG/2ML IN SUSP
0.2500 mg | Freq: Two times a day (BID) | RESPIRATORY_TRACT | Status: DC
  Administered 2021-10-31 – 2021-11-01 (×3): 0.25 mg via RESPIRATORY_TRACT
  Filled 2021-10-31 (×3): qty 2

## 2021-10-31 MED ORDER — ALBUTEROL SULFATE (2.5 MG/3ML) 0.083% IN NEBU: 2.5000 mg | INHALATION_SOLUTION | RESPIRATORY_TRACT | Status: DC | PRN

## 2021-10-31 MED ORDER — AZITHROMYCIN 500 MG PO TABS
500.0000 mg | ORAL_TABLET | Freq: Every day | ORAL | Status: AC
  Administered 2021-10-31: 500 mg via ORAL
  Filled 2021-10-31: qty 1

## 2021-10-31 MED ORDER — AZITHROMYCIN 250 MG PO TABS
250.0000 mg | ORAL_TABLET | Freq: Every day | ORAL | Status: DC
  Administered 2021-11-01: 250 mg via ORAL
  Filled 2021-10-31: qty 1

## 2021-10-31 MED ORDER — ALBUTEROL SULFATE (2.5 MG/3ML) 0.083% IN NEBU
2.5000 mg | INHALATION_SOLUTION | RESPIRATORY_TRACT | Status: DC
Start: 2021-10-31 — End: 2021-10-31

## 2021-10-31 NOTE — Progress Notes (Signed)
Family Medicine Teaching Service Daily Progress Note Intern Pager: [phone]  Patient name: Nathaniel Garcia Medical record number: [id number] Date of birth: [birth year] Age: 75 y.o. Gender: male  Primary Care Provider: Duanne Limerick, MD Consultants: Cardiology, signed off Code Status: DNI  Pt Overview and Major Events to Date:  12/14- admitted  Assessment and Plan: Nathaniel Garcia is a 75 y.o. male presenting with shortness of breath, found to be in acute COPD exacerbation. PMH is significant for COPD on chronic prednisone therapy, hx of CVA, HLD, BPH, Anxiety.   Acute on chronic respiratory failure with hypoxia  COPD, stage IV Did well on BiPAP overnight with improvement in VBG. pO2 26.0>35.0>64.1. Respiratory rate improved to 18-20. Remains afebrile. Last gas: pH7.36, pCO2 46.0, pO2 64.1, Bicarb 25.3.  - Continue Duonebs q4h scheduled with additional q2 PRN - Azithromycin 500 mg today and 250 mg daily for 4 days - Budesonide neb BID - Solu-medrol 125mg  today, transition to prednisone 40mg  daily tomorrow - Singulair 10mg  QHS - PT/OT - Incentive Spirometer - Wean respiratory support as tolerated. Home requirement 3L.   Atrial Bigeminy   Blocked PACs producing bradycardia Asymptomatic. Evaluated by cardiology yesterday who did not feel this was an acute issue, recommended discontinuing beta blocker. - D/c propranolol -Continue cardiac monitoring  Hx CVA - Continue atorvastatin 80mg  daily - Plavix 75mg  daily  Anxiety - Continue Sertraline 25mg  QHS  Essential tremor - d/c propranolol as above  BPH - Continue flomax 0.4mg  daily   FEN/GI: Regular PPx: Lovenox Dispo:Home with home health  tomorrow. Barriers include determining workable inhaler regimen.   Subjective:  Nathaniel Garcia reports feeling much better this morning.  He is eager to get home.  Says that this issue in the past has been affording his controller medications and that is why he has not been  taking them which is caused his 3 hospitalizations in the past year.  Objective: Temp:  [97.4 F (36.3 C)-98.2 F (36.8 C)] 97.6 F (36.4 C) (12/15 0309) Pulse Rate:  [42-152] 76 (12/15 0309) Resp:  [13-38] 20 (12/15 0309) BP: (109-169)/(53-87) 125/68 (12/15 0309) SpO2:  [3 %-100 %] 100 % (12/15 0315) FiO2 (%):  [35 %-40 %] 35 % (12/15 0315) Weight:  [56.3 kg-63.5 kg] 56.3 kg (12/14 2149) Physical Exam: General: Alert, sitting up in chair eating breakfast, NAD Cardiovascular: Irregular rhythm, regular rate, pulses normal, no murmur Respiratory: Normal work of breathing on 3 L nasal cannula, air movement improved compared to my previous exam, remains with prolonged expiratory phase, no wheeze Abdomen: Soft, nontender, nondistended Extremities: No swelling or deformity  Laboratory: Recent Labs  Lab 10/29/21 2006 10/30/21 1005 10/30/21 1924  WBC 8.3  --   --   HGB 13.1 13.6 12.9*  HCT 39.4 40.0 38.0*  PLT 229  --   --    Recent Labs  Lab 10/29/21 2006 10/30/21 1005 10/30/21 1924 10/31/21 0324  NA 140 143 140 134*  K 3.7 4.2 5.5* 4.3  CL 106  --   --  105  CO2 27  --   --  23  BUN 12  --   --  20  CREATININE 1.20  --   --  1.28*  CALCIUM 8.7*  --   --  8.5*  GLUCOSE 98  --   --  131*     Imaging/Diagnostic Tests: No new imaging, tests  11/01/21, MD 10/31/2021, 6:33 AM PGY-1, Nathaniel Garcia Family Medicine [facility]PTS Intern pager: [phone], text pages  welcome

## 2021-10-31 NOTE — Progress Notes (Signed)
Pt refused lab works, Nutritional therapist nad Venous blood gas. This RN and phlebotomy was in the room trying to reassure pt, but he refused. Said don't want any lab drawn till am, its been too many times. Dr. Jena Gauss aware. Pt on bi-pap. Will continue to monitor.

## 2021-10-31 NOTE — Progress Notes (Signed)
Pt has no order for bi-pap, oa call physician DR. Maxwell paged, received order for bi-pap. Pt sating 100% in 5l o2 via [place]. Eating. Will continue to monitor.

## 2021-10-31 NOTE — Evaluation (Signed)
Physical Therapy Evaluation and Discharge  Patient Details Name: Nathaniel Garcia MRN: [id number] DOB: May 10, 1946 Today's Date: 10/31/2021  History of Present Illness  Pt is a 75 y.o. male admitted on 10/29/21 with acute COPD exacerbation. PMH includes stroke (no residual deficits), HLD.  Clinical Impression  PTA, pt lived alone and was independent. Today, pt was independent to modified independent with all mobility, requiring 1 seated rest break due to SOB. Education provided on self-monitoring exertion and pursed lip breathing. Will d/c PT at this time. No PT follow up recommended due to pt functioning close to baseline.     Recommendations for follow up therapy are one component of a multi-disciplinary discharge planning process, led by the attending physician.  Recommendations may be updated based on patient status, additional functional criteria and insurance authorization.  Follow Up Recommendations No PT follow up    Assistance Recommended at Discharge PRN  Functional Status Assessment    Equipment Recommendations  None recommended by PT    Recommendations for Other Services       Precautions / Restrictions Precautions Precautions: Fall Precaution Comments: monitor O2/DOE; wears 3 L O2 prn baseline Restrictions Weight Bearing Restrictions: No      Mobility  Bed Mobility Overal bed mobility: Modified Independent             General bed mobility comments: received in recliner    Transfers Overall transfer level: Independent Equipment used: None                    Ambulation/Gait   Gait Distance (Feet): 172 Feet (+172)   Gait Pattern/deviations: Step-through pattern;WFL(Within Functional Limits) Gait velocity: functional     General Gait Details: cues for pursed lip breathing  Stairs Stairs: Yes Stairs assistance: Modified independent (Device/Increase time) Stair Management: One rail Left;One rail Right;Alternating  pattern;Forwards Number of Stairs: 10 General stair comments: required seated rest break at top of stairs due to SOB, cues for pursed lip breathing  Wheelchair Mobility    Modified Rankin (Stroke Patients Only)       Balance Overall balance assessment: Mild deficits observed, not formally tested                                           Pertinent Vitals/Pain Pain Assessment: Faces Faces Pain Scale: No hurt    Home Living Family/patient expects to be discharged to:: Private residence Living Arrangements: Alone Available Help at Discharge: Available PRN/intermittently Type of Home: Apartment Home Access: Level entry     Alternate Level Stairs-Number of Steps: flight Home Layout: Two level;Bed/bath upstairs Home Equipment: None Additional Comments: 3 L O2 baseline    Prior Function Prior Level of Function : Independent/Modified Independent;Driving             Mobility Comments: no use of AD, denies falls ADLs Comments: reports independent with ADLs (stands for showers, does not wear O2 in shower), IADLs in the home and driving/grocery shopping. Pt reports having a portable tank but does not use when out in store, endorses SOB/fatigue when carrying in groceries and requires rest breaks with similar straining tasks     Hand Dominance   Dominant Hand: Right    Extremity/Trunk Assessment   Upper Extremity Assessment Upper Extremity Assessment: Overall WFL for tasks assessed    Lower Extremity Assessment Lower Extremity Assessment: Overall WFL for tasks assessed  Cervical / Trunk Assessment Cervical / Trunk Assessment: Normal  Communication   Communication: No difficulties  Cognition Arousal/Alertness: Awake/alert Behavior During Therapy: WFL for tasks assessed/performed Overall Cognitive Status: Within Functional Limits for tasks assessed                                 General Comments: mild decreased insight into problem  solving strategies to ease SOB and maintain independence        General Comments General comments (skin integrity, edema, etc.): SpO2 98% on 3L pre-ambulation, down to 95% on 2L after stairs, quickly recovered to 98% with pursed lip breathing. Pt stated he would like a pulse ox for home - provided a few places he could get one. Educated pt on pursed lip breathing and taking rest breaks as needed.    Exercises     Assessment/Plan    PT Assessment Patient does not need any further PT services  PT Problem List         PT Treatment Interventions      PT Goals (Current goals can be found in the Care Plan section)  Acute Rehab PT Goals PT Goal Formulation: All assessment and education complete, DC therapy    Frequency     Barriers to discharge        Co-evaluation               AM-PAC PT "6 Clicks" Mobility  Outcome Measure Help needed turning from your back to your side while in a flat bed without using bedrails?: None Help needed moving from lying on your back to sitting on the side of a flat bed without using bedrails?: None Help needed moving to and from a bed to a chair (including a wheelchair)?: None Help needed standing up from a chair using your arms (e.g., wheelchair or bedside chair)?: None Help needed to walk in hospital room?: None Help needed climbing 3-5 steps with a railing? : None 6 Click Score: 24    End of Session   Activity Tolerance: Patient tolerated treatment well Patient left: in chair;with call bell/phone within reach;with nursing/sitter in room Nurse Communication: Mobility status PT Visit Diagnosis: Difficulty in walking, not elsewhere classified (R26.2)    Time: 1031-5945 PT Time Calculation (min) (ACUTE ONLY): 22 min   Charges:   PT Evaluation $PT Eval Low Complexity: 1 Low          Daryel November, SPT   Daryel November 10/31/2021, 10:36 AM

## 2021-10-31 NOTE — Progress Notes (Signed)
FPTS Interim Progress Note  Assessed patient due to patient concern for lesion on posterior penile shaft. Non-painful, non-pruritic, no penile drainage. "Looked like a whitehead." No history to raise concern for STI.  No lesion noted on my exam. Per patient lesion self-resolved after about one week.  Suspect that lesion was a simple comedone, no workup or intervention warranted.   Alicia Amel, MD 10/31/2021, 6:14 PM PGY-1, [facility] Family Medicine Service pager [phone]

## 2021-10-31 NOTE — Progress Notes (Signed)
FPTS Brief Progress Note  S: Patient seen at bedside for evening re-eval. He is breathing comfortably on BiPAP-- reports his breathing is much improved. He is very eager to eat.  O: BP 119/62 (BP Location: Right Arm)    Pulse 76    Temp (!) 97.4 F (36.3 C) (Oral)    Resp 18    Ht 6' (1.829 m)    Wt 56.3 kg    SpO2 100%    BMI 16.84 kg/m   Gen: alert, BiPAP in place, NAD Resp: breathing comfortably on BiPAP with RR in the teens and low 20s, no retractions, diminished breath sounds with very poor air movement throughout, few scattered inspiratory and expiratory wheezes appreciated  A/P: Acute COPD Exacerbation Patient improved on BiPAP and after CAT. Will trial off BiPAP to allow patient to eat. Continue close monitoring of respiratory status. Duonebs q4h scheduled and q2h prn.   - Orders reviewed. Labs for AM ordered, which was adjusted as needed.    Maury Dus, MD 10/31/2021, 12:59 AM PGY-2, [employer] Family Medicine Night Resident  Please page [phone] with questions.

## 2021-10-31 NOTE — Evaluation (Signed)
Occupational Therapy Evaluation Patient Details Name: Nathaniel Garcia MRN: [id number] DOB: Jan 15, 1946 Today's Date: 10/31/2021   History of Present Illness Pt is a 75 y.o. male admitted on 10/29/21 with acute COPD exacerbation. PMH includes stroke (no residual deficits), HLD.   Clinical Impression   PTA, pt lives alone and reports typically Independent with ADLs, IADLs, mobility and driving. Pt endorses fatigue with community IADL completion. Pt functioning close to baseline for ADLs with no more than Setup Assist needed. Pt overall Supervision for bathroom mobility without AD and denies DOE with these tasks. Educated on energy conservation (handout provided) with emphasis on breathing techniques, IADL strategies, consistent O2 use (specifically bathing and grocery shopping tasks), and monitoring DOE for rest break implementation. Encouraged obtaining pulse ox to monitor vitals at home. Pt would benefit from [facility] follow-up to assess home set up, energy conservation and activity modification in the home to reduce risk of falls and maximize functional level of independence while living with COPD. Will continue to follow acutely and update DC recs as appropriate.  SpO2 98-100% on 3 L O2, 1-2/4 DOE observed     Recommendations for follow up therapy are one component of a multi-disciplinary discharge planning process, led by the attending physician.  Recommendations may be updated based on patient status, additional functional criteria and insurance authorization.   Follow Up Recommendations  Home health OT    Assistance Recommended at Discharge PRN  Functional Status Assessment  Patient has had a recent decline in their functional status and demonstrates the ability to make significant improvements in function in a reasonable and predictable amount of time.  Equipment Recommendations  None recommended by OT (pt declines BSC for shower chair use)    Recommendations for Other Services        Precautions / Restrictions Precautions Precautions: Other (comment) Precaution Comments: monitor O2/DOE; wears 3 L O2 prn baseline Restrictions Weight Bearing Restrictions: No      Mobility Bed Mobility Overal bed mobility: Modified Independent                  Transfers Overall transfer level: Independent Equipment used: None                      Balance Overall balance assessment: Mild deficits observed, not formally tested                                         ADL either performed or assessed with clinical judgement   ADL Overall ADL's : Needs assistance/impaired Eating/Feeding: Independent   Grooming: Independent;Standing;Wash/dry face   Upper Body Bathing: Independent;Standing   Lower Body Bathing: Sit to/from stand;Set up   Upper Body Dressing : Independent;Sitting   Lower Body Dressing: Independent;Sit to/from stand   Toilet Transfer: Supervision/safety;Ambulation Toilet Transfer Details (indicate cue type and reason): able to stand for urination task, mild sway and unsteadiness noted exiting bathroom but able to self correct Toileting- Clothing Manipulation and Hygiene: Independent;Sit to/from stand       Functional mobility during ADLs: Supervision/safety General ADL Comments: Pt with minor limitations due to DOE, vitals WFL. Pt able to complete ADLs without much difficulty though anticipate deficits will impact IADL completion. Provided energy conservation education with handout     Vision Baseline Vision/History: 1 Wears glasses Ability to See in Adequate Light: 0 Adequate Patient Visual Report: No change from baseline Vision  Assessment?: No apparent visual deficits     Perception     Praxis      Pertinent Vitals/Pain Pain Assessment: No/denies pain     Hand Dominance Right   Extremity/Trunk Assessment Upper Extremity Assessment Upper Extremity Assessment: Overall WFL for tasks assessed   Lower  Extremity Assessment Lower Extremity Assessment: Defer to PT evaluation   Cervical / Trunk Assessment Cervical / Trunk Assessment: Normal   Communication Communication Communication: No difficulties   Cognition Arousal/Alertness: Awake/alert Behavior During Therapy: WFL for tasks assessed/performed Overall Cognitive Status: Within Functional Limits for tasks assessed                                 General Comments: mild decreased insight into problem solving strategies to ease SOB and maintain independence     General Comments  SpO2 98-100% on 3 L O2    Exercises     Shoulder Instructions      Home Living Family/patient expects to be discharged to:: Private residence Living Arrangements: Alone Available Help at Discharge: Available PRN/intermittently Type of Home: Apartment Home Access: Level entry     Home Layout: Two level;Bed/bath upstairs Alternate Level Stairs-Number of Steps: flight Alternate Level Stairs-Rails: Right Bathroom Shower/Tub: Chief Strategy Officer: Standard Bathroom Accessibility: Yes   Home Equipment: None   Additional Comments: 3 L O2 baseline      Prior Functioning/Environment Prior Level of Function : Independent/Modified Independent;Driving             Mobility Comments: no use of AD, denies falls ADLs Comments: reports independent with ADLs (stands for showers, does not wear O2 in shower), IADLs in the home and driving/grocery shopping. Pt reports having a portable tank but does not use when out in store, endorses SOB/fatigue when carrying in groceries and requires rest breaks with similar straining tasks        OT Problem List: Decreased activity tolerance;Impaired balance (sitting and/or standing);Cardiopulmonary status limiting activity;Decreased knowledge of precautions      OT Treatment/Interventions: Self-care/ADL training;Therapeutic exercise;Energy conservation;Therapeutic  activities;Patient/family education    OT Goals(Current goals can be found in the care plan section) Acute Rehab OT Goals Patient Stated Goal: go home OT Goal Formulation: With patient Time For Goal Achievement: 11/14/21 Potential to Achieve Goals: Good  OT Frequency: Min 2X/week   Barriers to D/C:            Co-evaluation              AM-PAC OT "6 Clicks" Daily Activity     Outcome Measure Help from another person eating meals?: None Help from another person taking care of personal grooming?: None Help from another person toileting, which includes using toliet, bedpan, or urinal?: A Little Help from another person bathing (including washing, rinsing, drying)?: A Little Help from another person to put on and taking off regular upper body clothing?: None Help from another person to put on and taking off regular lower body clothing?: A Little 6 Click Score: 21   End of Session Equipment Utilized During Treatment: Oxygen Nurse Communication: Mobility status (Rn present with initation of OOB activity)  Activity Tolerance: Patient tolerated treatment well Patient left: in chair;with call bell/phone within reach  OT Visit Diagnosis: Other (comment) (decreased cardiopulmonary tolerance)                Time: 8099-8338 OT Time Calculation (min): 25 min Charges:  OT  General Charges $OT Visit: 1 Visit OT Evaluation $OT Eval Low Complexity: 1 Low OT Treatments $Self Care/Home Management : 8-22 mins  Malachy Chamber, OTR/L Acute Rehab Services Office: [phone]   Layla Maw 10/31/2021, 8:24 AM

## 2021-10-31 NOTE — Progress Notes (Signed)
FPTS Brief Progress Note  S: Patient reports he feels good, denies any concerns related to his breathing. He would like to take the BiPAP off if possible.   O: BP 125/68 (BP Location: Right Arm)    Pulse 76    Temp 97.6 F (36.4 C) (Axillary)    Resp 20    Ht 6' (1.829 m)    Wt 56.3 kg    SpO2 100%    BMI 16.84 kg/m   Gen: alert, resting comfortably in bed, NAD Resp: mild tachypnea in the mid 20s, air movement improved from prior but still diminished overall, inspiratory and expiratory wheezes throughout (R>L).  A/P: Acute COPD Exacerbation Improving gradually. VBG shows pH 7.359, pCO2 46, bicarb 25. -Discussed with RT. Will trial off BiPAP -Continue duonebs  Remainder per H&P   Maury Dus, MD 10/31/2021, 3:54 AM PGY-2, [facility] Night Resident  Please page [phone] with questions.

## 2021-10-31 NOTE — Hospital Course (Addendum)
Brief Hospital Course Patient presented to [name] on 12/14 with shortness of breath. He was found to be in an acute COPD exacerbation.   COPD Stage IV, acute exacerbation On arrival, patient was tachypneic and had increased WOB with marked wheezing. VBG revealed pO2 of 26.0. He received DuoNebs and supplemental O2 and eventually required BiPAP for respiratory support overnight. He was treated with steroids, Azithromycin, duonebs, and pulmicort nebulizers. By hospital day 2, he was weaned to his home O2 requirement of 3L [place]. He was discharged on a regimen of Pulmicort nebulizer twice daily, DuoNebs 4 times day, and albuterol inhaler as needed. We arranged for Home Health services to help him adjust to this new regimen and discharged him with nebulizer cartridges, new tubing, and a mask.    Bigeminy noted on ECG Patient was noted to have atrial bigeminy and occassional blocked PACs producing bradycardia on admission ECG. Evaluated by cardiology who did not feel that this was an acute issue. Their only recommendation was to discontinue the daily beta blocker that he had been taking for essential tremor.   Nathaniel Garcia other chronic conditions were stable and managed on his home medications throughout the hospitalization.    Items for follow-up: Medication adherence seems to be an ongoing issue for Nathaniel Garcia, please periodically audit his home medication list to ensure that he is using his controller nebulizers (DuoNebs 4x daily and Pulmicort 2x daily). Recommend using an all-nebulizer regimen given difficulties he has with inhalers secondary to his pronounced emphysema.  His last doses of Azithromycin and prednisone should be on 12/19.  Home propanolol for his essential tremor was discontinued due to atrial bigeminy. Recommend outpatient STI screening Recommend patient education to maintain O2 sats between 88-92% as opposed to trying to maintain close to 100.

## 2021-10-31 NOTE — Progress Notes (Signed)
Pt received from ED via stretcher, pt ao x 4. On 6l o2 via [place]. Pt came off Bi-pap for transport and supposed to go back after he eats per transferring RN. CHG bath completed, connected to tele and CCMD notified. Oriented pt to room and call bell system. Call bell within reach. Will continue to monitor.

## 2021-10-31 NOTE — Plan of Care (Signed)

## 2021-10-31 NOTE — Progress Notes (Signed)
Pt resting comfortably on 3L [place] stable VS. No BIPAP needed at this time.

## 2021-11-01 ENCOUNTER — Telehealth: Payer: Self-pay | Admitting: Family Medicine

## 2021-11-01 ENCOUNTER — Other Ambulatory Visit (HOSPITAL_COMMUNITY): Payer: Self-pay

## 2021-11-01 DIAGNOSIS — J441 Chronic obstructive pulmonary disease with (acute) exacerbation: Secondary | ICD-10-CM | POA: Diagnosis not present

## 2021-11-01 DIAGNOSIS — G4733 Obstructive sleep apnea (adult) (pediatric): Secondary | ICD-10-CM | POA: Diagnosis not present

## 2021-11-01 DIAGNOSIS — J439 Emphysema, unspecified: Secondary | ICD-10-CM | POA: Diagnosis not present

## 2021-11-01 DIAGNOSIS — J449 Chronic obstructive pulmonary disease, unspecified: Secondary | ICD-10-CM | POA: Diagnosis not present

## 2021-11-01 MED ORDER — TAMSULOSIN HCL 0.4 MG PO CAPS
ORAL_CAPSULE | ORAL | 3 refills | Status: DC
  Filled 2021-11-01: qty 90, 90d supply, fill #0

## 2021-11-01 MED ORDER — CLOPIDOGREL BISULFATE 75 MG PO TABS
ORAL_TABLET | ORAL | 1 refills | Status: DC
  Filled 2021-11-01: qty 90, 90d supply, fill #0

## 2021-11-01 MED ORDER — IPRATROPIUM-ALBUTEROL 0.5-2.5 (3) MG/3ML IN SOLN
3.0000 mL | Freq: Four times a day (QID) | RESPIRATORY_TRACT | Status: DC
  Administered 2021-11-01: 3 mL via RESPIRATORY_TRACT
  Filled 2021-11-01 (×2): qty 3

## 2021-11-01 MED ORDER — IPRATROPIUM-ALBUTEROL 0.5-2.5 (3) MG/3ML IN SOLN
3.0000 mL | Freq: Four times a day (QID) | RESPIRATORY_TRACT | 0 refills | Status: AC
  Filled 2021-11-01: qty 360, 30d supply, fill #0

## 2021-11-01 MED ORDER — BUDESONIDE 0.25 MG/2ML IN SUSP
0.2500 mg | Freq: Two times a day (BID) | RESPIRATORY_TRACT | 12 refills | Status: DC
  Filled 2021-11-01: qty 60, 30d supply, fill #0

## 2021-11-01 MED ORDER — SERTRALINE HCL 25 MG PO TABS
25.0000 mg | ORAL_TABLET | Freq: Every day | ORAL | 1 refills | Status: AC
  Filled 2021-11-01: qty 90, 90d supply, fill #0

## 2021-11-01 MED ORDER — PREDNISONE 20 MG PO TABS
40.0000 mg | ORAL_TABLET | Freq: Every day | ORAL | 0 refills | Status: AC
  Filled 2021-11-01: qty 6, 3d supply, fill #0

## 2021-11-01 MED ORDER — AZITHROMYCIN 250 MG PO TABS
ORAL_TABLET | ORAL | 0 refills | Status: DC
  Filled 2021-11-01: qty 3, 3d supply, fill #0

## 2021-11-01 NOTE — Progress Notes (Signed)
SATURATION QUALIFICATIONS: (This note is used to comply with regulatory documentation for home oxygen)  Patient Saturations on Room Air at Rest = 85%. pt on 3L at baseline  Patient Saturations on Room Air while Ambulating = pt on 3L at baseline  Patient Saturations on 3 Liters of oxygen while Ambulating = 94-98%  Please briefly explain why patient needs home oxygen: Pt requires 3L NCO2 at all times to maintain O2 saturations >90%

## 2021-11-01 NOTE — Progress Notes (Signed)
FPTS Brief Progress Note  S: Went to bedside for evening rounds- patient was sleeping comfortably. Spoke with primary RN who does not have any concerns at this time. Reports patient is doing well on 3L [place].   O: BP 126/75 (BP Location: Left Arm)    Pulse 68    Temp (!) 97.4 F (36.3 C) (Axillary)    Resp 20    Ht 6' (1.829 m)    Wt 56.3 kg    SpO2 100%    BMI 16.84 kg/m     A/P: Nathaniel Garcia is a 75 y.o. male admitted with acute on chronic hypoxemic respiratory failure 2/2 acute exacerbation of severe COPD. Greatly improved from admission and remains stable on 3L [place] at the present time. Continue systemic steroids, antibiotics, and nebulizers. Possibly home later today vs tomorrow.  - Orders reviewed. Labs for AM not ordered, which was adjusted as needed.     Maury Dus, MD 11/01/2021, 1:49 AM PGY-2, [facility] Night Resident  Please page [phone] with questions.

## 2021-11-01 NOTE — TOC Transition Note (Addendum)
Transition of Care (TOC) - CM/SW Discharge Note Donn Pierini RN, BSN Transitions of Care Unit 4E- RN Case Manager See Treatment Team for direct phone #    Patient Details  Name: Nathaniel Garcia MRN: [id number] Date of Birth: [birth year]  [facility] CM/SW Contact:  Darrold Span, RN Phone Number: 11/01/2021, 11:51 AM   Clinical Narrative:    Pt stable for transition home today, pt lives at home alone, orders placed for HHPT/OT/aide/RT.  CM spoke with pt and sister at the bedside.  Answered questions about aide assistance at home with private duty- pt would like aide several days a week- explained this is not covered under Medicare and pt/families have to set this type of service up themselves.  Pt has home 02 with [employer]- needs transport tank for discharge.  Reviewed with pt HH orders- pt declines bath aide- states he can give himself his own bath. Agreeable to therapy services- choice offered Per CMS guidelines from medicare.gov website with star ratings (copy placed in shadow chart) - pt states he had an agency before out of W/S- asked if it was [facility]- pt confirmed yes and he would like to use them again.  Confirmed address, phone # and PCP in epic.   Call made to [employer] for transport 02 tank- will have delivered to room for discharge.  Call made to Marylene Land with [facility] for [facility] referral- [facility] requesting RN be added for disease management- COPD. - referral pending confirmation.  1230- received confirmation from [facility] regarding [facility] services- pt is active with them for HHPT- they will add RN/OT as well.    Final next level of care: Home w Home Health Services Barriers to Discharge: No Barriers Identified   Patient Goals and CMS Choice Patient states their goals for this hospitalization and ongoing recovery are:: return home CMS Medicare.gov Compare Post Acute Care list provided to:: Patient Choice offered to / list presented to : Patient  Discharge  Placement               Home w/ [facility]        Discharge Plan and Services   Discharge Planning Services: CM Consult Post Acute Care Choice: Home Health          DME Arranged: N/A DME Agency: NA       HH Arranged: RN, PT, OT HH Agency: [facility] Date [facility] Agency Contacted: 11/01/21 Time HH Agency Contacted: 1115 Representative spoke with at [facility] Agency: Marylene Land  Social Determinants of Health (SDOH) Interventions     Readmission Risk Interventions Readmission Risk Prevention Plan 11/01/2021  Transportation Screening Complete  PCP or Specialist Appt within 5-7 Days Complete  Home Care Screening Complete  Medication Review (RN CM) Complete  Some recent data might be hidden

## 2021-11-01 NOTE — Telephone Encounter (Signed)
Marylene Land from [facility] called in, states patient requests delay of care unitl Wednesday.

## 2021-11-01 NOTE — Discharge Instructions (Addendum)
Dear Nathaniel Garcia,  Thank you for letting us participate in your care. You were hospitalized for shortness of breath and diagnosed with COPD with acute exacerbation (HCC). You were treated with antibiotics and adjustments to your home nebulizers.   POST-HOSPITAL & CARE INSTRUCTIONS Be sure to complete your antibiotics. It is very important that you take your nebulizers as prescribed.  I noted that you have had inhalers in the past, but these nebulizers should work better for you given your emphysema. I recommend that you call your primary care doctor, Dr. Yetta Barre, set up a hospital follow-up appointment in the next few weeks. Go to your follow up appointments (listed below) Your goal oxygen saturation is between 88-92%. Do NOT turn up your oxygen unless it is below 88%.   DOCTOR'S APPOINTMENT   Future Appointments  Date Time Provider Department Center  02/06/2022  1:20 PM Duanne Limerick, MD [facility]  07/02/2022 10:00 AM [facility] NURSE HEALTH ADVISOR [facility]  08/11/2022 10:00 AM McGowan, Carollee Herter A, PA-C [facility] None     Take care and be well!  Family Medicine Teaching Service Inpatient Team [place]  [facility]  [address] [place], [place] [address] [phone]

## 2021-11-01 NOTE — Discharge Summary (Signed)
[facility] Discharge Summary  Patient name: Nathaniel Garcia Medical record number: [id number] Date of birth: 29-Dec-1945 Age: 75 y.o. Gender: male Date of Admission: 10/29/2021  Date of Discharge: 11/01/2021 Admitting Physician: Nathaniel La, MD  Primary Care Provider: Juline Patch, MD Consultants: Cardiology  Indication for Hospitalization: Dyspnea  Discharge Diagnoses/Problem List:  Principal Problem:   COPD with acute exacerbation [facility]) Active Problems:   Non compliance with medical treatment   Junctional escape rhythm   Abnormal EKG   Acute and chronic respiratory failure with hypoxia [facility])   Disposition: Home with Home Health  Discharge Condition: Stable, at baseline  Discharge Exam:  Gen: Alert, sitting up in bed, NAD Cardio: Irregular rhythm, regular rate, normal pulses, no murmur/rub/gallop Pulm: Normal work of breathing on 3 L, faint breath sounds throughout, prolonged expiratory phase, no wheeze Abdomen: Soft, nontender, nondistended Extremities: Without swelling or deformity  Brief Hospital Course:  Nathaniel Garcia Hospital Course Patient presented to [facility] on 12/14 with shortness of breath. He was found to be in an acute COPD exacerbation.   COPD Stage IV, acute exacerbation On arrival, patient was tachypneic and had increased WOB with marked wheezing. VBG revealed pO2 of 26.0. He received DuoNebs and supplemental O2 and eventually required BiPAP for respiratory support overnight. He was treated with steroids, Azithromycin, duonebs, and pulmicort nebulizers. By hospital day 2, he was weaned to his home O2 requirement of 3L . He was discharged on a regimen of Pulmicort nebulizer twice daily, DuoNebs 4 times day, and albuterol inhaler as needed. We arranged for Home Health services to help him adjust to this new regimen and discharged him with nebulizer cartridges, new tubing, and a mask.    Bigeminy noted on ECG Patient was noted to  have atrial bigeminy and occassional blocked PACs producing bradycardia on admission ECG. Evaluated by cardiology who did not feel that this was an acute issue. Their only recommendation was to discontinue the daily beta blocker that he had been taking for essential tremor.   Nathaniel Garcia other chronic conditions were stable and managed on his home medications throughout the hospitalization.    Items for follow-up: Medication adherence seems to be an ongoing issue for Nathaniel Garcia, please periodically audit his home medication list to ensure that he is using his controller nebulizers (DuoNebs 4x daily and Pulmicort 2x daily). Recommend using an all-nebulizer regimen given difficulties he has with inhalers secondary to his pronounced emphysema.  His last doses of Azithromycin and prednisone should be on 12/19.  Home propanolol for his essential tremor was discontinued due to atrial bigeminy. Recommend outpatient STI screening Recommend patient education to maintain O2 sats between 88-92% as opposed to trying to maintain close to 100.     Significant Procedures: None  Significant Labs and Imaging:  Recent Labs  Lab 10/29/21 2006 10/30/21 1005 10/30/21 1924  WBC 8.3  --   --   HGB 13.1 13.6 12.9*  HCT 39.4 40.0 38.0*  PLT 229  --   --    Recent Labs  Lab 10/29/21 2006 10/30/21 1005 10/30/21 1702 10/30/21 1924 10/31/21 0324  NA 140 143  --  140 134*  K 3.7 4.2  --  5.5* 4.3  CL 106  --   --   --  105  CO2 27  --   --   --  23  GLUCOSE 98  --   --   --  131*  BUN 12  --   --   --  20  CREATININE 1.20  --   --   --  1.28*  CALCIUM 8.7*  --   --   --  8.5*  MG  --   --  1.8  --   --     EXAM: CHEST - 2 VIEW   COMPARISON:  08/27/2021   FINDINGS: Hyperinflation with emphysema. No focal opacity, pleural effusion or pneumothorax. Normal cardiomediastinal silhouette.   IMPRESSION: No active cardiopulmonary disease.  Emphysema     Electronically Signed   By: Nathaniel Garcia  M.D.   On: 10/29/2021 20:37  Results/Tests Pending at Time of Discharge: None  Discharge Medications:  Allergies as of 11/01/2021       Reactions   Hctz [hydrochlorothiazide] Rash        Medication List     STOP taking these medications    budesonide 0.5 MG/2ML nebulizer solution Commonly known as: PULMICORT Replaced by: budesonide 0.25 MG/2ML nebulizer solution   nicotine 14 mg/24hr Garcia Commonly known as: NICODERM CQ - dosed in mg/24 hours   propranolol 10 MG tablet Commonly known as: INDERAL       TAKE these medications    albuterol 108 (90 Base) MCG/ACT inhaler Commonly known as: VENTOLIN HFA Inhale 2 puffs into the lungs every 6 (six) hours as needed for wheezing or shortness of breath.   atorvastatin 80 MG tablet Commonly known as: LIPITOR Take 1 tablet (80 mg total) by mouth daily.   azithromycin 250 MG tablet Commonly known as: ZITHROMAX Take 1 tablet by mouth once daily for the next 3 days.   budesonide 0.25 MG/2ML nebulizer solution Commonly known as: PULMICORT Use 1 vial (0.25 mg total) by nebulization 2 (two) times daily. Replaces: budesonide 0.5 MG/2ML nebulizer solution   clopidogrel 75 MG tablet Commonly known as: PLAVIX TAKE 1 TABLET(75 MG) BY MOUTH DAILY What changed:  how much to take how to take this when to take this additional instructions   ipratropium-albuterol 0.5-2.5 (3) MG/3ML Soln Commonly known as: DUONEB Use 1 vial by nebulization 4 (four) times daily.   montelukast 10 MG tablet Commonly known as: SINGULAIR Take 1 tablet (10 mg total) by mouth at bedtime.   predniSONE 20 MG tablet Commonly known as: DELTASONE Take 2 tablets (40 mg total) by mouth daily with breakfast for 3 days. What changed:  medication strength how much to take when to take this   sertraline 25 MG tablet Commonly known as: ZOLOFT Take 1 tablet (25 mg total) by mouth at bedtime.   tamsulosin 0.4 MG Caps capsule Commonly known as:  FLOMAX TAKE 1 CAPSULE(0.4 MG) BY MOUTH DAILY What changed:  how much to take how to take this when to take this additional instructions               Durable Medical Equipment  (From admission, onward)           Start     Ordered   11/01/21 1139  For home use only DME oxygen  Once       Question Answer Comment  Length of Need Lifetime   Mode or (Route) Nasal cannula   Liters per Minute 3   Frequency Continuous (stationary and portable oxygen unit needed)   Oxygen delivery system Gas      11/01/21 1138   11/01/21 0000  For home use only DME Other see comment       Comments: Mask for nebulizer, nebulizer tubing  Question:  Length of Need  Answer:  Lifetime   11/01/21 1018   11/01/21 0000  For home use only DME Other see comment       Comments: Pulse ox monitor  Question:  Length of Need  Answer:  Lifetime   11/01/21 1018            Discharge Instructions: Please refer to Patient Instructions section of EMR for full details.  Patient was counseled important signs and symptoms that should prompt return to medical care, changes in medications, dietary instructions, activity restrictions, and follow up appointments.   Follow-Up Appointments:   Alicia Amel, MD 11/01/2021, 11:45 AM PGY-1, [facility]

## 2021-11-01 NOTE — Progress Notes (Signed)
Order received to discharge patient.  Telemetry monitor removed and CCMD notified.  PIV access removed.  Discharge instructions, follow up, medications and instructions for their use discussed with patient.  Travel O2 tank and TOC pharmacy medications were in patient's possession upon his departure.

## 2021-11-05 ENCOUNTER — Telehealth: Payer: Self-pay

## 2021-11-05 NOTE — Telephone Encounter (Signed)
Transition Care Management Follow-up Telephone Call Date of discharge and from where: 1216/22 [facility] How have you been since you were released from the hospital? Pt states he is doing okay Any questions or concerns? No  Items Reviewed: Did the pt receive and understand the discharge instructions provided? Yes  Medications obtained and verified? Yes  Other? No  Any new allergies since your discharge? No  Dietary orders reviewed? Yes Do you have support at home? Yes   Home Care and Equipment/Supplies: Were home health services ordered? yes If so, what is the name of the agency? [facility]  Has the agency set up a time to come to the patient's home? yes Were any new equipment or medical supplies ordered?  No  Functional Questionnaire: (I = Independent and D = Dependent) ADLs: I  Bathing/Dressing- I  Meal Prep- I  Eating- I  Maintaining continence- I  Transferring/Ambulation- I  Managing Meds- I  Follow up appointments reviewed:  PCP Hospital f/u appt confirmed? Yes  Scheduled to see Dr. Yetta Barre on 11/14/21 @ 10:40. Specialist Hospital f/u appt confirmed? No   Are transportation arrangements needed? No  If their condition worsens, is the pt aware to call PCP or go to the Emergency Dept.? Yes Was the patient provided with contact information for the PCP's office or ED? Yes Was to pt encouraged to call back with questions or concerns? Yes

## 2021-11-06 ENCOUNTER — Telehealth: Payer: Self-pay | Admitting: Family Medicine

## 2021-11-06 DIAGNOSIS — N400 Enlarged prostate without lower urinary tract symptoms: Secondary | ICD-10-CM | POA: Diagnosis not present

## 2021-11-06 DIAGNOSIS — R001 Bradycardia, unspecified: Secondary | ICD-10-CM | POA: Diagnosis not present

## 2021-11-06 DIAGNOSIS — I700 Atherosclerosis of aorta: Secondary | ICD-10-CM | POA: Diagnosis not present

## 2021-11-06 DIAGNOSIS — I6523 Occlusion and stenosis of bilateral carotid arteries: Secondary | ICD-10-CM | POA: Diagnosis not present

## 2021-11-06 DIAGNOSIS — E782 Mixed hyperlipidemia: Secondary | ICD-10-CM | POA: Diagnosis not present

## 2021-11-06 DIAGNOSIS — D649 Anemia, unspecified: Secondary | ICD-10-CM | POA: Diagnosis not present

## 2021-11-06 DIAGNOSIS — J9601 Acute respiratory failure with hypoxia: Secondary | ICD-10-CM | POA: Diagnosis not present

## 2021-11-06 DIAGNOSIS — Z8673 Personal history of transient ischemic attack (TIA), and cerebral infarction without residual deficits: Secondary | ICD-10-CM | POA: Diagnosis not present

## 2021-11-06 DIAGNOSIS — J432 Centrilobular emphysema: Secondary | ICD-10-CM | POA: Diagnosis not present

## 2021-11-06 DIAGNOSIS — R69 Illness, unspecified: Secondary | ICD-10-CM | POA: Diagnosis not present

## 2021-11-13 DIAGNOSIS — J432 Centrilobular emphysema: Secondary | ICD-10-CM | POA: Diagnosis not present

## 2021-11-13 DIAGNOSIS — R001 Bradycardia, unspecified: Secondary | ICD-10-CM | POA: Diagnosis not present

## 2021-11-13 DIAGNOSIS — R350 Frequency of micturition: Secondary | ICD-10-CM | POA: Diagnosis not present

## 2021-11-13 DIAGNOSIS — I700 Atherosclerosis of aorta: Secondary | ICD-10-CM | POA: Diagnosis not present

## 2021-11-13 DIAGNOSIS — J9621 Acute and chronic respiratory failure with hypoxia: Secondary | ICD-10-CM | POA: Diagnosis not present

## 2021-11-13 DIAGNOSIS — D649 Anemia, unspecified: Secondary | ICD-10-CM | POA: Diagnosis not present

## 2021-11-13 DIAGNOSIS — G250 Essential tremor: Secondary | ICD-10-CM | POA: Diagnosis not present

## 2021-11-13 DIAGNOSIS — E782 Mixed hyperlipidemia: Secondary | ICD-10-CM | POA: Diagnosis not present

## 2021-11-13 DIAGNOSIS — N401 Enlarged prostate with lower urinary tract symptoms: Secondary | ICD-10-CM | POA: Diagnosis not present

## 2021-11-13 DIAGNOSIS — I6523 Occlusion and stenosis of bilateral carotid arteries: Secondary | ICD-10-CM | POA: Diagnosis not present

## 2021-11-14 ENCOUNTER — Telehealth: Payer: Self-pay

## 2021-11-14 ENCOUNTER — Other Ambulatory Visit: Payer: Self-pay

## 2021-11-14 ENCOUNTER — Encounter: Payer: Self-pay | Admitting: Family Medicine

## 2021-11-14 ENCOUNTER — Telehealth: Payer: Self-pay | Admitting: Family Medicine

## 2021-11-14 ENCOUNTER — Ambulatory Visit (INDEPENDENT_AMBULATORY_CARE_PROVIDER_SITE_OTHER): Payer: Medicare HMO | Admitting: Family Medicine

## 2021-11-14 VITALS — BP 100/50 | HR 60 | Ht 72.0 in | Wt 132.0 lb

## 2021-11-14 DIAGNOSIS — Z113 Encounter for screening for infections with a predominantly sexual mode of transmission: Secondary | ICD-10-CM

## 2021-11-14 DIAGNOSIS — E871 Hypo-osmolality and hyponatremia: Secondary | ICD-10-CM

## 2021-11-14 DIAGNOSIS — R69 Illness, unspecified: Secondary | ICD-10-CM | POA: Diagnosis not present

## 2021-11-14 DIAGNOSIS — I498 Other specified cardiac arrhythmias: Secondary | ICD-10-CM | POA: Diagnosis not present

## 2021-11-14 DIAGNOSIS — Z09 Encounter for follow-up examination after completed treatment for conditions other than malignant neoplasm: Secondary | ICD-10-CM

## 2021-11-14 DIAGNOSIS — J449 Chronic obstructive pulmonary disease, unspecified: Secondary | ICD-10-CM

## 2021-11-14 MED ORDER — BUDESONIDE 0.25 MG/2ML IN SUSP: 0.2500 mg | Freq: Two times a day (BID) | RESPIRATORY_TRACT | 12 refills | Status: AC

## 2021-11-14 NOTE — Progress Notes (Signed)
Date:  11/14/2021   Name:  Nathaniel Garcia   DOB:  June 06, 1946   MRN:  [id number]   Chief Complaint: COPD  Pt was recently admitted to [facility] hospital with COPD exacerbation with respiratory failure on 10/29/2021 and was discharged on 11/01/2021. Transition of care call placed on 11/05/2021 (weekend included).  Patient has upcoming appointment with pulmonary tomorrow at which time there may be consideration for long-acting steroid/beta agonist/anticholinergic.    COPD There is no chest tightness, cough, difficulty breathing, frequent throat clearing, hemoptysis, hoarse voice, shortness of breath, sputum production or wheezing. This is a chronic problem. The current episode started 1 to 4 weeks ago. The problem occurs intermittently. The problem has been gradually improving. Pertinent negatives include no appetite change, chest pain, dyspnea on exertion, ear congestion, ear pain, fever, headaches, heartburn, malaise/fatigue, myalgias, nasal congestion, orthopnea, PND, postnasal drip, rhinorrhea, sneezing, sore throat, sweats, trouble swallowing or weight loss. He reports moderate improvement on treatment. His symptoms are not alleviated by beta-agonist, steroid inhaler and oral steroids. His past medical history is significant for COPD.   Lab Results  Component Value Date   NA 134 (L) 10/31/2021   K 4.3 10/31/2021   CO2 23 10/31/2021   GLUCOSE 131 (H) 10/31/2021   BUN 20 10/31/2021   CREATININE 1.28 (H) 10/31/2021   CALCIUM 8.5 (L) 10/31/2021   GFRNONAA 58 (L) 10/31/2021   Lab Results  Component Value Date   CHOL 177 06/29/2018   HDL 54 06/29/2018   LDLCALC 110 (H) 06/29/2018   TRIG 64 06/29/2018   CHOLHDL 3.3 06/29/2018   Lab Results  Component Value Date   TSH 1.650 05/30/2019   No results found for: HGBA1C Lab Results  Component Value Date   WBC 8.3 10/29/2021   HGB 12.9 (L) 10/30/2021   HCT 38.0 (L) 10/30/2021   MCV 103.4 (H) 10/29/2021   PLT 229  10/29/2021   Lab Results  Component Value Date   ALT 17 08/28/2021   AST 15 08/28/2021   ALKPHOS 45 08/28/2021   BILITOT 0.6 08/28/2021   No results found for: 25OHVITD2, 25OHVITD3, VD25OH   Review of Systems  Constitutional:  Negative for appetite change, fever, malaise/fatigue and weight loss.  HENT:  Negative for ear pain, hoarse voice, postnasal drip, rhinorrhea, sneezing, sore throat and trouble swallowing.   Respiratory:  Negative for cough, hemoptysis, sputum production, chest tightness, shortness of breath and wheezing.   Cardiovascular:  Negative for chest pain, dyspnea on exertion, palpitations, leg swelling and PND.  Gastrointestinal:  Negative for heartburn.  Musculoskeletal:  Negative for myalgias.  Neurological:  Negative for headaches.   Patient Active Problem List   Diagnosis Date Noted   Junctional escape rhythm 10/30/2021   Abnormal EKG 10/30/2021   Acute and chronic respiratory failure with hypoxia (HCC) 10/30/2021   COPD with acute exacerbation (HCC) 01/03/2021   Bullous emphysema (HCC) 01/03/2021   Acute exacerbation of chronic obstructive pulmonary disease (COPD) (HCC) 10/31/2020   COPD exacerbation (HCC) 10/29/2020   Acute respiratory failure with hypoxia (HCC) 10/29/2020   History of stroke 10/29/2020   Aortic atherosclerosis (HCC) 12/30/2018   Benign hematuria 09/18/2016   Weakness of left hand 07/02/2016   Lipid screening 01/03/2016   Cerebral vascular disease 09/14/2015   Carotid artery narrowing 08/15/2015   Bilateral carotid artery stenosis 08/15/2015   Bradycardia 08/01/2015   H/O transient cerebral ischemia 08/01/2015   Combined fat and carbohydrate induced hyperlipemia 08/01/2015   Breathlessness on exertion  07/30/2015   Absolute anemia 04/03/2015   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[place]) 04/03/2015   Cerebral vascular accident ([place]) 04/03/2015   HLD (hyperlipidemia) 04/03/2015   Blood glucose elevated 04/03/2015   Lung nodule, solitary 04/03/2015    Non compliance with medical treatment 04/03/2015   Lung nodule, multiple 04/03/2015   Cerebral infarction ([place]) 04/03/2015   Abnormal lung field 04/03/2015    Allergies  Allergen Reactions   Hctz [Hydrochlorothiazide] Rash    Past Surgical History:  Procedure Laterality Date   COLONOSCOPY  2011   normal- [facility] docs    Social History   Tobacco Use   Smoking status: Former    Packs/day: 0.02    Years: 60.00    Pack years: 1.20    Types: Cigarettes    Quit date: 10/26/2020    Years since quitting: 1.0   Smokeless tobacco: Never   Tobacco comments:    [facility] smoking cessation information provided  Vaping Use   Vaping Use: Never used  Substance Use Topics   Alcohol use: Yes    Alcohol/week: 2.0 standard drinks    Types: 2 Cans of beer per week    Comment: weekly   Drug use: Not Currently    Comment: marijuana     Medication list has been reviewed and updated.  Current Meds  Medication Sig   albuterol (VENTOLIN HFA) 108 (90 Base) MCG/ACT inhaler Inhale 2 puffs into the lungs every 6 (six) hours as needed for wheezing or shortness of breath.   atorvastatin (LIPITOR) 80 MG tablet Take 1 tablet (80 mg total) by mouth daily.   budesonide (PULMICORT) 0.25 MG/2ML nebulizer solution Use 1 vial (0.25 mg total) by nebulization 2 (two) times daily.   clopidogrel (PLAVIX) 75 MG tablet TAKE 1 TABLET(75 MG) BY MOUTH DAILY   ipratropium-albuterol (DUONEB) 0.5-2.5 (3) MG/3ML SOLN Use 1 vial by nebulization 4 (four) times daily.   montelukast (SINGULAIR) 10 MG tablet Take 1 tablet (10 mg total) by mouth at bedtime.   sertraline (ZOLOFT) 25 MG tablet Take 1 tablet (25 mg total) by mouth at bedtime.   tamsulosin (FLOMAX) 0.4 MG CAPS capsule TAKE 1 CAPSULE(0.4 MG) BY MOUTH DAILY    PHQ 2/9 Scores 10/09/2021 08/01/2021 07/12/2021 07/01/2021  PHQ - 2 Score 0 0 0 0  PHQ- 9 Score 0 0 2 -    GAD 7 : Generalized Anxiety Score 10/09/2021 08/01/2021 07/12/2021 07/12/2021  Nervous,  Anxious, on Edge 0 0 1 0  Control/stop worrying 0 0 0 0  Worry too much - different things 0 0 0 0  Trouble relaxing 0 0 1 0  Restless 0 0 1 0  Easily annoyed or irritable 0 0 0 0  Afraid - awful might happen 0 0 0 0  Total GAD 7 Score 0 0 3 0  Anxiety Difficulty Not difficult at all - Somewhat difficult -    BP Readings from Last 3 Encounters:  11/14/21 (!) 100/50  11/01/21 (!) (P) 124/92  10/29/21 (!) 162/70    Physical Exam Vitals and nursing note reviewed.  HENT:     Head: Normocephalic.     Right Ear: External ear normal.     Left Ear: External ear normal.     Nose: Nose normal.  Eyes:     General: No scleral icterus.       Right eye: No discharge.        Left eye: No discharge.     Conjunctiva/sclera: Conjunctivae normal.  Pupils: Pupils are equal, round, and reactive to light.  Neck:     Thyroid: No thyromegaly.     Vascular: No JVD.     Trachea: No tracheal deviation.  Cardiovascular:     Rate and Rhythm: Normal rate and regular rhythm.     Heart sounds: Normal heart sounds, S1 normal and S2 normal. No murmur heard. No systolic murmur is present.  No diastolic murmur is present.    No friction rub. No gallop. No S3 or S4 sounds.  Pulmonary:     Effort: No respiratory distress.     Breath sounds: Normal breath sounds. Decreased air movement present. No decreased breath sounds, wheezing, rhonchi or rales.  Abdominal:     General: Bowel sounds are normal.     Palpations: Abdomen is soft. There is no mass.     Tenderness: There is no abdominal tenderness. There is no guarding or rebound.  Musculoskeletal:        General: No tenderness. Normal range of motion.     Cervical back: Normal range of motion and neck supple.  Lymphadenopathy:     Cervical: No cervical adenopathy.  Skin:    General: Skin is warm.     Findings: No rash.  Neurological:     Mental Status: He is alert and oriented to person, place, and time.     Cranial Nerves: No cranial nerve  deficit.     Deep Tendon Reflexes: Reflexes are normal and symmetric.    Wt Readings from Last 3 Encounters:  11/14/21 132 lb (59.9 kg)  11/01/21 126 lb 6.4 oz (57.3 kg)  10/09/21 132 lb (59.9 kg)    BP (!) 100/50    Pulse 60    Ht 6' (1.829 m)    Wt 132 lb (59.9 kg)    BMI 17.90 kg/m   Assessment and Plan: Pt was recently admitted to [facility] for respiratory failure secondary to stage IV COPD on 10/29/2021 and was discharged on 11/01/2021. Transition of care call placed on 11/05/2021.   1. Stage 4 very severe COPD by GOLD classification [facility]) Patient has stage IV COPD which is follow-up from hospitalization for respiratory failure.  Patient is doing well but has been taking his handheld albuterol on a much too frequent basis again.  Has been reemphasized that he is only to take his nebulized albuterol at this time 4 times a day and his Pulmicort nebulizer 0.25 mg per 2 mL twice a day.  Patient has been continuing his prednisone 20 mg daily.  He is also been continuing his Singulair as that he feels this also helps.  We will check CBC and BMP as suggested in discharge summary. - CBC with Differential/Platelet - budesonide (PULMICORT) 0.25 MG/2ML nebulizer solution; Use 1 vial (0.25 mg total) by nebulization 2 (two) times daily.  Dispense: 60 mL; Refill: 12  2. Hyponatremia Patient with history of hyponatremia we will check stat BMP for current electrolytes and GFR. - Basic metabolic panel  3. Atrial bigeminy Patient was experiencing atrial bigeminy which patient feels as if he is in normal sinus rhythm at this time.  Discontinuance of the propranolol due to his tremor probably has benefited this as well as perhaps not exacerbated his COPD.  4. Routine screening for STI (sexually transmitted infection) On examination in the hospital there is an area that was noted in the penile area and it was suggested that on return to outpatient evaluation that he be checked for STD.  We will at this time will check GC/chlamydia/RPR/HIV. - GC/Chlamydia Probe Amp - RPR - HIV Antibody (routine testing w rflx)  5. Hospital discharge follow-up As noted above patient has hospital discharge follow-up for acute exacerbation COPD assessment.  Patient is doing well with discharge and has upcoming appointment with pulmonary at which time it may be considered that he needs to be on a long-acting combination such as Trelegy or Breztri.

## 2021-11-14 NOTE — Telephone Encounter (Signed)
Home Health Verbal Orders - Caller/Agency: [facility] Number: [phone]/ vm can be left  Requesting OT  Frequency: wants to move OT eval to the week of January 9th / please advise

## 2021-11-14 NOTE — Telephone Encounter (Signed)
Copied from CRM [id number]. Topic: General - Inquiry >> Nov 14, 2021  2:47 PM Daphine Deutscher D wrote: Reason for CRM: pt called confused about the prescriptions that were sent to the pharmacy today after his visit.  He would like a nure call him back and go over what was sent in with him.  He said Pulmicort was sent in and he has never used it before.  CB# [phone]

## 2021-11-15 DIAGNOSIS — J449 Chronic obstructive pulmonary disease, unspecified: Secondary | ICD-10-CM | POA: Diagnosis not present

## 2021-11-15 DIAGNOSIS — J9601 Acute respiratory failure with hypoxia: Secondary | ICD-10-CM | POA: Diagnosis not present

## 2021-11-15 DIAGNOSIS — R0609 Other forms of dyspnea: Secondary | ICD-10-CM | POA: Diagnosis not present

## 2021-11-15 LAB — BASIC METABOLIC PANEL
BUN/Creatinine Ratio: 13 (ref 10–24)
BUN: 16 mg/dL (ref 8–27)
CO2: 24 mmol/L (ref 20–29)
Calcium: 9.4 mg/dL (ref 8.6–10.2)
Chloride: 102 mmol/L (ref 96–106)
Creatinine, Ser: 1.23 mg/dL (ref 0.76–1.27)
Glucose: 94 mg/dL (ref 70–99)
Potassium: 5.1 mmol/L (ref 3.5–5.2)
Sodium: 141 mmol/L (ref 134–144)
eGFR: 61 mL/min/{1.73_m2} (ref >59)

## 2021-11-15 LAB — CBC WITH DIFFERENTIAL/PLATELET
Basophils Absolute: 0 10*3/uL (ref 0.0–0.2)
Basos: 0 %
EOS (ABSOLUTE): 0 10*3/uL (ref 0.0–0.4)
Eos: 0 %
Hematocrit: 41.5 % (ref 37.5–51.0)
Hemoglobin: 13.7 g/dL (ref 13.0–17.7)
Immature Grans (Abs): 0 10*3/uL (ref 0.0–0.1)
Immature Granulocytes: 0 %
Lymphocytes Absolute: 0.9 10*3/uL (ref 0.7–3.1)
Lymphs: 9 %
MCH: 33.3 pg — ABNORMAL HIGH (ref 26.6–33.0)
MCHC: 33 g/dL (ref 31.5–35.7)
MCV: 101 fL — ABNORMAL HIGH (ref 79–97)
Monocytes Absolute: 0.5 10*3/uL (ref 0.1–0.9)
Monocytes: 5 %
Neutrophils Absolute: 7.9 10*3/uL — ABNORMAL HIGH (ref 1.4–7.0)
Neutrophils: 86 %
Platelets: 190 10*3/uL (ref 150–450)
RBC: 4.11 x10E6/uL — ABNORMAL LOW (ref 4.14–5.80)
RDW: 12.3 % (ref 11.6–15.4)
WBC: 9.3 10*3/uL (ref 3.4–10.8)

## 2021-11-15 LAB — RPR: RPR Ser Ql: NONREACTIVE

## 2021-11-15 LAB — HIV ANTIBODY (ROUTINE TESTING W REFLEX): HIV Screen 4th Generation wRfx: NONREACTIVE

## 2021-11-15 LAB — GC/CHLAMYDIA PROBE AMP
Chlamydia trachomatis, NAA: NEGATIVE
Neisseria Gonorrhoeae by PCR: NEGATIVE

## 2021-11-16 ENCOUNTER — Other Ambulatory Visit: Payer: Self-pay | Admitting: Family Medicine

## 2021-11-16 DIAGNOSIS — F419 Anxiety disorder, unspecified: Secondary | ICD-10-CM

## 2021-11-16 NOTE — Telephone Encounter (Signed)
Requested Prescriptions  Pending Prescriptions Disp Refills   sertraline (ZOLOFT) 25 MG tablet [Pharmacy Med Name: SERTRALINE 25MG  TABLETS] 30 tablet     Sig: TAKE 1 TABLET(25 MG) BY MOUTH AT BEDTIME     Psychiatry:  Antidepressants - SSRI Passed - 11/16/2021  3:07 AM      Passed - Valid encounter within last 6 months    Recent Outpatient Visits          2 days ago Stage 4 very severe COPD by GOLD classification (HCC)   [facility] 11/18/2021, MD   1 month ago Mild intermittent reactive airway disease without complication   [facility] Duanne Limerick, MD   2 months ago Hospital discharge follow-up   [facility], MD   3 months ago Benign essential tremor   [facility] Duanne Limerick, MD   4 months ago Cerebral vascular disease   [facility] Duanne Limerick, MD      Future Appointments            In 2 months Duanne Limerick, MD [facility]   In 8 months [place], [facility] [place]

## 2021-11-20 ENCOUNTER — Telehealth: Payer: Self-pay | Admitting: Family Medicine

## 2021-11-20 NOTE — Telephone Encounter (Signed)
Copied from [place] [id number]. Topic: Quick Communication - Home Health Verbal Orders >> Nov 20, 2021  9:12 AM Tessa Lerner A wrote: Caller/Agency: Hal[facility] Maidens   Callback Number:  [phone]  Requesting OT/PT/Skilled Nursing/Social Work/Speech Therapy: OT  Frequency: Ailene Ravel would like to move patient's evaluation to 11/25/21

## 2021-11-22 DIAGNOSIS — N401 Enlarged prostate with lower urinary tract symptoms: Secondary | ICD-10-CM | POA: Diagnosis not present

## 2021-11-22 DIAGNOSIS — R350 Frequency of micturition: Secondary | ICD-10-CM | POA: Diagnosis not present

## 2021-11-22 DIAGNOSIS — I700 Atherosclerosis of aorta: Secondary | ICD-10-CM | POA: Diagnosis not present

## 2021-11-22 DIAGNOSIS — J9621 Acute and chronic respiratory failure with hypoxia: Secondary | ICD-10-CM | POA: Diagnosis not present

## 2021-11-22 DIAGNOSIS — R001 Bradycardia, unspecified: Secondary | ICD-10-CM | POA: Diagnosis not present

## 2021-11-22 DIAGNOSIS — J432 Centrilobular emphysema: Secondary | ICD-10-CM | POA: Diagnosis not present

## 2021-11-22 DIAGNOSIS — E782 Mixed hyperlipidemia: Secondary | ICD-10-CM | POA: Diagnosis not present

## 2021-11-22 DIAGNOSIS — D649 Anemia, unspecified: Secondary | ICD-10-CM | POA: Diagnosis not present

## 2021-11-22 DIAGNOSIS — I6523 Occlusion and stenosis of bilateral carotid arteries: Secondary | ICD-10-CM | POA: Diagnosis not present

## 2021-11-22 DIAGNOSIS — G250 Essential tremor: Secondary | ICD-10-CM | POA: Diagnosis not present

## 2021-11-25 ENCOUNTER — Telehealth: Payer: Self-pay

## 2021-11-25 DIAGNOSIS — J432 Centrilobular emphysema: Secondary | ICD-10-CM | POA: Diagnosis not present

## 2021-11-25 DIAGNOSIS — G4733 Obstructive sleep apnea (adult) (pediatric): Secondary | ICD-10-CM | POA: Diagnosis not present

## 2021-11-25 DIAGNOSIS — G250 Essential tremor: Secondary | ICD-10-CM | POA: Diagnosis not present

## 2021-11-25 DIAGNOSIS — E782 Mixed hyperlipidemia: Secondary | ICD-10-CM | POA: Diagnosis not present

## 2021-11-25 DIAGNOSIS — J439 Emphysema, unspecified: Secondary | ICD-10-CM | POA: Diagnosis not present

## 2021-11-25 DIAGNOSIS — R350 Frequency of micturition: Secondary | ICD-10-CM | POA: Diagnosis not present

## 2021-11-25 DIAGNOSIS — N401 Enlarged prostate with lower urinary tract symptoms: Secondary | ICD-10-CM | POA: Diagnosis not present

## 2021-11-25 DIAGNOSIS — I700 Atherosclerosis of aorta: Secondary | ICD-10-CM | POA: Diagnosis not present

## 2021-11-25 DIAGNOSIS — J441 Chronic obstructive pulmonary disease with (acute) exacerbation: Secondary | ICD-10-CM | POA: Diagnosis not present

## 2021-11-25 DIAGNOSIS — I6523 Occlusion and stenosis of bilateral carotid arteries: Secondary | ICD-10-CM | POA: Diagnosis not present

## 2021-11-25 DIAGNOSIS — J9621 Acute and chronic respiratory failure with hypoxia: Secondary | ICD-10-CM | POA: Diagnosis not present

## 2021-11-25 DIAGNOSIS — R001 Bradycardia, unspecified: Secondary | ICD-10-CM | POA: Diagnosis not present

## 2021-11-25 DIAGNOSIS — D649 Anemia, unspecified: Secondary | ICD-10-CM | POA: Diagnosis not present

## 2021-11-25 DIAGNOSIS — J449 Chronic obstructive pulmonary disease, unspecified: Secondary | ICD-10-CM | POA: Diagnosis not present

## 2021-11-25 NOTE — Telephone Encounter (Signed)
Copied from [place] [id number]. Topic: General - Other >> Nov 25, 2021  2:45 PM Tessa Lerner A wrote: Reason for [id number] with Elliot Cousin has called to share that the patient had an OT evaluation today 11/25/21 and requires no additional visits   [place] contact further if needed

## 2021-11-26 DIAGNOSIS — J449 Chronic obstructive pulmonary disease, unspecified: Secondary | ICD-10-CM | POA: Diagnosis not present

## 2021-12-06 ENCOUNTER — Other Ambulatory Visit: Payer: Self-pay

## 2021-12-06 DIAGNOSIS — R3129 Other microscopic hematuria: Secondary | ICD-10-CM

## 2021-12-08 NOTE — Progress Notes (Signed)
12/09/2021 10:23 AM   [name] [birth year] [id number]  Referring provider: Juline Patch, MD [address] [place],  [place] [address]  Chief Complaint  Patient presents with   Benign Prostatic Hypertrophy   Urological history: 1. High risk hematuria -smoker -CTU 09/2016 revealed adrenal glands are unremarkable. No urinary stones. Ureters are decompressed. No filling defects in the intrarenal collecting systems, ureters or bladder.Prostate is enlarged and slightly indents the bladder base. -cystoscopy in January 2018 and no worrisome findings were discovered.  -RUS on 08/12/2018 revealed right kidney is rather small with slight increase in renal echogenicity. Question renal artery stenosis as a potential etiology for these findings. In this regard, question whether patient is hypertensive. Note that similar changes of this nature are not noted on the left.  No obstructing focus in either kidney.  No renal mass evident.  Mildly prominent prostate with mild inhomogeneous echotexture within the prostate.   -Cystoscopy with Dr. Diamantina Providence 07/2019 was NED -CTU 2022 - NED -no reports of gross heme -UA 6-10 RBC's  2. BPH with LU TS -PSA 1.9 in 2020 -aged out of screening -I PSS 7/1 -PVR 62 mL -managed with tamsulosin 0.4 mg daily  HPI: Maxamillion Banas is a 76 y.o. male who presents today for incontinence.  At his visit on 08/12/2021, he had no urinary complaints.  He was found to have micro heme, so he underwent a CT urogram which was NED.  His tamsulosin was refilled and he was scheduled for one year follow up.    In December 2022, he was admitted for a COPD exacerbation.  There was also an incidental finding of a penile lesion, so he had STI testing.  HIV, RPR and GC/Chlamydia were negative.    He has been experiencing a new onset of urge incontinence with small volume urine loss associated with a foul odor for the last 2 to 3 weeks.  Patient denies  any modifying or aggravating factors.  Patient denies any gross hematuria, dysuria or suprapubic/flank pain.  Patient denies any fevers, chills, nausea or vomiting.    He also has a small area on his scrotum that is irritating to him.  He states that showed up 2 to 3 weeks ago as well.   UA  6-10 RBC's    IPSS     Row Name 12/09/21 0900         International Prostate Symptom Score   How often have you had the sensation of not emptying your bladder? About half the time     How often have you had to urinate less than every two hours? Less than 1 in 5 times     How often have you found you stopped and started again several times when you urinated? Not at All     How often have you found it difficult to postpone urination? Not at All     How often have you had a weak urinary stream? Less than 1 in 5 times     How often have you had to strain to start urination? Not at All     How many times did you typically get up at night to urinate? 2 Times     Total IPSS Score 7       Quality of Life due to urinary symptoms   If you were to spend the rest of your life with your urinary condition just the way it is now how would you feel  about that? Pleased                Score:  1-7 Mild 8-19 Moderate 20-35 Severe   PMH: Past Medical History:  Diagnosis Date   COPD (chronic obstructive pulmonary disease) (HCC)    Hematuria    Hyperlipidemia    Stroke [facility])     Surgical History: Past Surgical History:  Procedure Laterality Date   COLONOSCOPY  2011   normal- [facility] docs    Home Medications:  Allergies as of 12/09/2021       Reactions   Hctz [hydrochlorothiazide] Rash        Medication List        Accurate as of December 09, 2021 10:23 AM. If you have any questions, ask your nurse or doctor.          albuterol 108 (90 Base) MCG/ACT inhaler Commonly known as: VENTOLIN HFA Inhale 2 puffs into the lungs every 6 (six) hours as needed for wheezing or shortness of  breath.   atorvastatin 80 MG tablet Commonly known as: LIPITOR Take 1 tablet (80 mg total) by mouth daily.   budesonide 0.25 MG/2ML nebulizer solution Commonly known as: PULMICORT Use 1 vial (0.25 mg total) by nebulization 2 (two) times daily.   clopidogrel 75 MG tablet Commonly known as: PLAVIX TAKE 1 TABLET(75 MG) BY MOUTH DAILY   ipratropium-albuterol 0.5-2.5 (3) MG/3ML Soln Commonly known as: DUONEB Use 1 vial by nebulization 4 (four) times daily.   montelukast 10 MG tablet Commonly known as: SINGULAIR Take 1 tablet (10 mg total) by mouth at bedtime.   sertraline 25 MG tablet Commonly known as: ZOLOFT Take 1 tablet (25 mg total) by mouth at bedtime.   sulfamethoxazole-trimethoprim 800-160 MG tablet Commonly known as: BACTRIM DS Take 1 tablet by mouth every 12 (twelve) hours. Started by: Zara Council, PA-C   tamsulosin 0.4 MG Caps capsule Commonly known as: FLOMAX TAKE 1 CAPSULE(0.4 MG) BY MOUTH DAILY        Allergies:  Allergies  Allergen Reactions   Hctz [Hydrochlorothiazide] Rash    Family History: Family History  Problem Relation Age of Onset   Prostate cancer Neg Hx     Social History:  reports that he quit smoking about 13 months ago. His smoking use included cigarettes. He has a 1.20 pack-year smoking history. He has never used smokeless tobacco. He reports current alcohol use of about 2.0 standard drinks per week. He reports that he does not currently use drugs.  ROS: For pertinent review of systems please refer to history of present illness  Physical Exam: BP (!) 130/54 (BP Location: Left Arm, Patient Position: Sitting, Cuff Size: Normal)    Pulse 86    Ht 6' (1.829 m)    Wt 136 lb (61.7 kg)    BMI 18.44 kg/m   Constitutional:  Well nourished. Alert and oriented, No acute distress. HEENT: [place] AT, mask in place.  Trachea midline Cardiovascular: No clubbing, cyanosis, or edema. Respiratory: Normal respiratory effort, no increased work of  breathing. GU: No CVA tenderness.  No bladder fullness or masses.  Patient with circumcised phallus. Urethral meatus is patent.  No penile discharge. No penile lesions or rashes. Scrotum without lesions, cysts, rashes and/or edema.  Testicles are located scrotally bilaterally. No masses are appreciated in the testicles. Left and right epididymis are normal.  There is a small healed ulcer in his scrotum 2 mm in size.   Neurologic: Grossly intact, no focal deficits, moving all 4  extremities. Psychiatric: Normal mood and affect.   Laboratory Data: Component     Latest Ref Rng & Units 10/30/2020 01/04/2021 10/30/2021  HIV Screen 4th Generation wRfx     Non Reactive Non Reactive Non Reactive Non Reactive   Component     Latest Ref Rng & Units 11/14/2021  HIV Screen 4th Generation wRfx     Non Reactive Non Reactive   Component     Latest Ref Rng & Units 11/14/2021  RPR     Non Reactive Non Reactive   Component     Latest Ref Rng & Units 11/14/2021          WBC     3.4 - 10.8 x10E3/uL 9.3  RBC     4.14 - 5.80 x10E6/uL 4.11 (L)  Hemoglobin     13.0 - 17.7 g/dL 13.7  HCT     37.5 - 51.0 % 41.5  MCV     79 - 97 fL 101 (H)  MCH     26.6 - 33.0 pg 33.3 (H)  MCHC     31.5 - 35.7 g/dL 33.0  RDW     11.6 - 15.4 % 12.3  Platelets     150 - 450 x10E3/uL 190  nRBC     0.0 - 0.2 %   Neutrophils     Not Estab. % 86  NEUT#     1.4 - 7.0 x10E3/uL 7.9 (H)  Lymphocytes     %   Lymphocyte #     0.7 - 3.1 x10E3/uL 0.9  Monocytes Relative     %   Monocyte #     0.1 - 1.0 K/uL   Eosinophil     %   Eosinophils Absolute     0.0 - 0.5 K/uL   Basophil     %   Basophils Absolute     0.0 - 0.2 x10E3/uL 0.0  Immature Granulocytes     Not Estab. % 0  Abs Immature Granulocytes     0.00 - 0.07 K/uL   Lymphs     Not Estab. % 9  Monocytes     Not Estab. % 5  Eos     Not Estab. % 0  Basos     Not Estab. % 0  Monocytes Absolute     0.1 - 0.9 x10E3/uL 0.5  EOS (ABSOLUTE)     0.0 -  0.4 x10E3/uL 0.0  Immature Grans (Abs)     0.0 - 0.1 x10E3/uL 0.0  pH, Arterial     7.350 - 7.450   pCO2 arterial     32.0 - 48.0 mmHg   pO2, Arterial     83.0 - 108.0 mmHg   Bicarbonate     20.0 - 28.0 mmol/L   TCO2     22 - 32 mmol/L   O2 Saturation     %   Acid-base deficit     0.0 - 2.0 mmol/L   Sodium     135 - 145 mmol/L   Potassium     3.5 - 5.1 mmol/L   Calcium Ionized     1.15 - 1.40 mmol/L   Patient temperature        Collection site        Drawn by        Sample type        Acid-Base Excess     0.0 - 2.0 mmol/L   pH, Ven     7.250 - 7.430  pCO2, Ven     44.0 - 60.0 mmHg   pO2, Ven     32.0 - 45.0 mmHg   Comment        WBC, UA     0 - 5 /hpf   Epithelial Cells (non renal)     0 - 10 /hpf   Bacteria, UA     None seen/Few    Component     Latest Ref Rng & Units 11/14/2021          Glucose     70 - 99 mg/dL 94  BUN     8 - 27 mg/dL 16  Creatinine     0.76 - 1.27 mg/dL 1.23  eGFR     >59 mL/min/1.73 61  BUN/Creatinine Ratio     10 - 24 13  Sodium     134 - 144 mmol/L 141  Potassium     3.5 - 5.2 mmol/L 5.1  Chloride     96 - 106 mmol/L 102  CO2     20 - 29 mmol/L 24  Calcium     8.6 - 10.2 mg/dL 9.4   Component     Latest Ref Rng & Units 11/14/2021  Chlamydia     Negative Negative  Neisseria Gonorrhoeae by PCR     Negative Negative   Urinalysis Component     Latest Ref Rng & Units 12/09/2021  Color, Urine     YELLOW YELLOW  Appearance     CLEAR CLEAR  Specific Gravity, Urine     1.005 - 1.030 1.025  pH     5.0 - 8.0 5.5  Glucose, UA     NEGATIVE mg/dL NEGATIVE  Hgb urine dipstick     NEGATIVE TRACE (A)  Bilirubin Urine     NEGATIVE NEGATIVE  Ketones, ur     NEGATIVE mg/dL NEGATIVE  Protein     NEGATIVE mg/dL NEGATIVE  Nitrite     NEGATIVE NEGATIVE  Leukocytes,Ua     NEGATIVE NEGATIVE  Squamous Epithelial / LPF     0 - 5 0-5  WBC, UA     0 - 5 WBC/hpf 0-5  RBC / HPF     0 - 5 RBC/hpf 6-10  Bacteria,  UA     NONE SEEN RARE (A)  Mucus      PRESENT  Hyaline Casts, UA      PRESENT  I have reviewed the labs.    Pertinent Imaging  12/09/21 09:57  Scan Result 65m     Assessment & Plan:    1. Incontinence -PVR is minimal -UA with micro heme and foul odor -urine sent for culture -start Septra DS, BID x 7 days  2. BPH with LUTS -PSA aged out of screening -UA micro heme  -PVR < 300 cc -most bothersome symptoms are urge incontinence -continue conservative management, avoiding bladder irritants and timed voiding's -symptoms may be secondary to infection, will reassess when urine culture is available and he has been appropriately treated    3. High risk hematuria -Hematuria work up completed in 2022 - NED -no reports of gross heme -UA micro heme -send for urine culture  -if urine culture is negative - will send urine cytology            Return in about 3 weeks (around 12/30/2021) for recheck symptoms and recheck UA .  These notes generated with voice recognition software. I apologize for typographical errors.  SZara Council [facility]  [place],  [address] [phone]

## 2021-12-09 ENCOUNTER — Ambulatory Visit (INDEPENDENT_AMBULATORY_CARE_PROVIDER_SITE_OTHER): Payer: Medicare HMO | Admitting: Urology

## 2021-12-09 ENCOUNTER — Other Ambulatory Visit: Payer: Self-pay

## 2021-12-09 ENCOUNTER — Encounter: Payer: Self-pay | Admitting: Urology

## 2021-12-09 ENCOUNTER — Other Ambulatory Visit
Admission: RE | Admit: 2021-12-09 | Discharge: 2021-12-09 | Disposition: A | Discharge status: Home / Self Care | Payer: Medicare HMO | Attending: Urology | Admitting: Urology

## 2021-12-09 VITALS — BP 130/54 | HR 86 | Ht 72.0 in | Wt 136.0 lb

## 2021-12-09 DIAGNOSIS — R319 Hematuria, unspecified: Secondary | ICD-10-CM

## 2021-12-09 DIAGNOSIS — R32 Unspecified urinary incontinence: Secondary | ICD-10-CM

## 2021-12-09 DIAGNOSIS — R3129 Other microscopic hematuria: Secondary | ICD-10-CM | POA: Diagnosis not present

## 2021-12-09 DIAGNOSIS — N401 Enlarged prostate with lower urinary tract symptoms: Secondary | ICD-10-CM

## 2021-12-09 DIAGNOSIS — N138 Other obstructive and reflux uropathy: Secondary | ICD-10-CM

## 2021-12-09 LAB — URINALYSIS, COMPLETE (UACMP) WITH MICROSCOPIC
Bilirubin Urine: NEGATIVE
Glucose, UA: NEGATIVE mg/dL
Ketones, ur: NEGATIVE mg/dL
Leukocytes,Ua: NEGATIVE
Nitrite: NEGATIVE
Protein, ur: NEGATIVE mg/dL
Specific Gravity, Urine: 1.025 (ref 1.005–1.030)
pH: 5.5 (ref 5.0–8.0)

## 2021-12-09 LAB — BLADDER SCAN AMB NON-IMAGING

## 2021-12-09 MED ORDER — SULFAMETHOXAZOLE-TRIMETHOPRIM 800-160 MG PO TABS: 1.0000 | ORAL_TABLET | Freq: Two times a day (BID) | ORAL | 0 refills | Status: DC

## 2021-12-10 LAB — URINE CULTURE

## 2021-12-12 DIAGNOSIS — Z9981 Dependence on supplemental oxygen: Secondary | ICD-10-CM | POA: Diagnosis not present

## 2021-12-12 DIAGNOSIS — R0609 Other forms of dyspnea: Secondary | ICD-10-CM | POA: Diagnosis not present

## 2021-12-12 DIAGNOSIS — J449 Chronic obstructive pulmonary disease, unspecified: Secondary | ICD-10-CM | POA: Diagnosis not present

## 2021-12-13 ENCOUNTER — Ambulatory Visit: Payer: Self-pay | Admitting: *Deleted

## 2021-12-13 ENCOUNTER — Telehealth: Payer: Self-pay | Admitting: *Deleted

## 2021-12-13 ENCOUNTER — Other Ambulatory Visit: Payer: Self-pay

## 2021-12-13 ENCOUNTER — Ambulatory Visit: Payer: Medicare HMO | Admitting: *Deleted

## 2021-12-13 DIAGNOSIS — R319 Hematuria, unspecified: Secondary | ICD-10-CM | POA: Diagnosis not present

## 2021-12-13 LAB — MICROSCOPIC EXAMINATION

## 2021-12-13 LAB — URINALYSIS, COMPLETE
Bilirubin, UA: NEGATIVE
Glucose, UA: NEGATIVE
Ketones, UA: NEGATIVE
Leukocytes,UA: NEGATIVE
Nitrite, UA: NEGATIVE
Protein,UA: NEGATIVE
Specific Gravity, UA: >1.03 — ABNORMAL HIGH (ref 1.005–1.030)
Urobilinogen, Ur: 0.2 mg/dL (ref 0.2–1.0)
pH, UA: 6 (ref 5.0–7.5)

## 2021-12-13 NOTE — Telephone Encounter (Signed)
Reason for Disposition  [1] SEVERE pain with urination (e.g., excruciating) AND [2] not improved after 2 hours of pain medicine (e.g., acetaminophen or ibuprofen)  Answer Assessment - Initial Assessment Questions 1. SYMPTOM: "What's the main symptom you're concerned about?" (e.g., frequency, incontinence)     Pain with urination 2. ONSET: "When did the  burning  start?"     Today after catheter  3. PAIN: "Is there any pain?" If Yes, ask: "How bad is it?" (Scale: 1-10; mild, moderate, severe)     severe 4. CAUSE: "What do you think is causing the symptoms?"     catheter 5. OTHER SYMPTOMS: "Do you have any other symptoms?" (e.g., fever, flank pain, blood in urine, pain with urination)     no 6. PREGNANCY: "Is there any chance you are pregnant?" "When was your last menstrual period?"     *No Answer*  Protocols used: Urinary Symptoms-A-AH, Urination Pain - Male-A-AH

## 2021-12-13 NOTE — Progress Notes (Signed)
Patient ID: Nathaniel Garcia, male   DOB: [birth year], 75 y.o.   MRN: [id number] In and Out Catheterization  Patient is present today for a I & O catheterization due to Rescollection for UA. Patient was cleaned and prepped in a sterile fashion with betadine . A 14FR cath was inserted no complications were noted , 63ml of urine return was noted, urine was yellow in color. A clean urine sample was collected for UA and Culture. Bladder was drained  And catheter was removed with out difficulty.    Performed by: Mervin Hack, CMA  Follow up/ Additional notes: will call with results

## 2021-12-13 NOTE — Telephone Encounter (Signed)
Pt calling stating that he can't urinate after having a cath specimen collected today. I advised pt that I emptied his bladder and he needs to drink plenty of water to be able to urinate. Advised pt to call if any problems.

## 2021-12-13 NOTE — Telephone Encounter (Signed)
°  Chief Complaint: pain with urination Symptoms: pain with urination after I&O catheter today Frequency: every time passes urine Pertinent Negatives: Patient denies fever, blood in urine Disposition: [] ED /[x] Urgent Care (no appt availability in office) / [] Appointment(In office/virtual)/ []  [facility]/ [] Home Care/ [] Refused Recommended Disposition /[] [facility]/ []  Follow-up with PCP Additional Notes:

## 2021-12-16 LAB — CULTURE, URINE COMPREHENSIVE

## 2021-12-19 ENCOUNTER — Telehealth: Payer: Self-pay

## 2021-12-19 NOTE — Telephone Encounter (Signed)
Copied from [place] [id number]. Topic: General - Inquiry >> Dec 19, 2021  2:14 PM Oneta Rack wrote: Caller states patient is being discharged from [facility] because patient is not wanting home health services.

## 2021-12-24 ENCOUNTER — Telehealth: Payer: Self-pay | Admitting: Family Medicine

## 2021-12-24 ENCOUNTER — Telehealth: Payer: Self-pay

## 2021-12-24 DIAGNOSIS — R001 Bradycardia, unspecified: Secondary | ICD-10-CM | POA: Diagnosis not present

## 2021-12-24 DIAGNOSIS — N401 Enlarged prostate with lower urinary tract symptoms: Secondary | ICD-10-CM | POA: Diagnosis not present

## 2021-12-24 DIAGNOSIS — I6523 Occlusion and stenosis of bilateral carotid arteries: Secondary | ICD-10-CM | POA: Diagnosis not present

## 2021-12-24 DIAGNOSIS — J9621 Acute and chronic respiratory failure with hypoxia: Secondary | ICD-10-CM | POA: Diagnosis not present

## 2021-12-24 DIAGNOSIS — I700 Atherosclerosis of aorta: Secondary | ICD-10-CM | POA: Diagnosis not present

## 2021-12-24 DIAGNOSIS — E782 Mixed hyperlipidemia: Secondary | ICD-10-CM | POA: Diagnosis not present

## 2021-12-24 DIAGNOSIS — G250 Essential tremor: Secondary | ICD-10-CM | POA: Diagnosis not present

## 2021-12-24 DIAGNOSIS — J432 Centrilobular emphysema: Secondary | ICD-10-CM | POA: Diagnosis not present

## 2021-12-24 DIAGNOSIS — D649 Anemia, unspecified: Secondary | ICD-10-CM | POA: Diagnosis not present

## 2021-12-24 DIAGNOSIS — R350 Frequency of micturition: Secondary | ICD-10-CM | POA: Diagnosis not present

## 2021-12-24 NOTE — Telephone Encounter (Signed)
Copied from [place] [id number]. Topic: Quick Communication - Home Health Verbal Orders >> Dec 24, 2021 12:37 PM [place], Apolonio Schneiders wrote: [facility] called in to let Dr Ronnald Ramp the patient is being discharged from home health and services have been completed.

## 2021-12-24 NOTE — Telephone Encounter (Signed)
[facility] called in to state that pt has finished care and is being discharged today [phone]

## 2021-12-25 ENCOUNTER — Telehealth: Payer: Self-pay | Admitting: Family Medicine

## 2021-12-25 NOTE — Telephone Encounter (Signed)
Nathaniel Garcia from [facility], called in, states requesting to be discharged and now he doesn't. She is requestin a new order for skilled nursing.

## 2021-12-26 ENCOUNTER — Telehealth: Payer: Self-pay

## 2021-12-26 DIAGNOSIS — J439 Emphysema, unspecified: Secondary | ICD-10-CM | POA: Diagnosis not present

## 2021-12-26 DIAGNOSIS — J449 Chronic obstructive pulmonary disease, unspecified: Secondary | ICD-10-CM | POA: Diagnosis not present

## 2021-12-26 DIAGNOSIS — J441 Chronic obstructive pulmonary disease with (acute) exacerbation: Secondary | ICD-10-CM | POA: Diagnosis not present

## 2021-12-26 DIAGNOSIS — G4733 Obstructive sleep apnea (adult) (pediatric): Secondary | ICD-10-CM | POA: Diagnosis not present

## 2021-12-26 NOTE — Progress Notes (Signed)
12/30/21 3:06 PM   Nathaniel Garcia [birth year] [id number]  Referring provider:  Duanne Limerick, MD [address] [place],  [place] [address] Chief Complaint  Patient presents with   Follow-up    3 week follow-up    Urological history:  1. High risk hematuria -smoker -CTU 09/2016 revealed adrenal glands are unremarkable. No urinary stones. Ureters are decompressed. No filling defects in the intrarenal collecting systems, ureters or bladder.Prostate is enlarged and slightly indents the bladder base. -cystoscopy in January 2018 and no worrisome findings were discovered.  -RUS on 08/12/2018 revealed right kidney is rather small with slight increase in renal echogenicity. Question renal artery stenosis as a potential etiology for these findings. In this regard, question whether patient is hypertensive. Note that similar changes of this nature are not noted on the left.  No obstructing focus in either kidney.  No renal mass evident.  Mildly prominent prostate with mild inhomogeneous echotexture within the prostate.   -Cystoscopy with Dr. Richardo Hanks 07/2019 was NED -CTU 2022 - NED -no reports of gross heme -UA negative for micro heme  2. BPH with LU TS -PSA 1.9 in 2020 -aged out of screening -managed with tamsulosin 0.4 mg daily   HPI: Nathaniel Garcia is a 76 y.o.male who returns today for 3 week follow-up for recheck on symptoms and recheck on UA.   In December 2022, he was admitted for a COPD exacerbation.  There was also an incidental finding of a penile lesion, so he had STI testing.  HIV, RPR and GC/Chlamydia were negative  He also has a small area on his scrotum that is irritating to him. Patient denies any modifying or aggravating factors.  Patient denies any gross hematuria, dysuria or suprapubic/flank pain. Patient denies any fevers, chills, nausea or vomiting.    PMH: Past Medical History:  Diagnosis Date   COPD (chronic obstructive pulmonary disease)  (HCC)    Hematuria    Hyperlipidemia    Stroke [facility])     Surgical History: Past Surgical History:  Procedure Laterality Date   COLONOSCOPY  2011   normal- MD docs    Home Medications:  Allergies as of 12/30/2021       Reactions   Hctz [hydrochlorothiazide] Rash        Medication List        Accurate as of December 30, 2021  3:06 PM. If you have any questions, ask your nurse or doctor.          albuterol 108 (90 Base) MCG/ACT inhaler Commonly known as: VENTOLIN HFA Inhale 2 puffs into the lungs every 6 (six) hours as needed for wheezing or shortness of breath.   atorvastatin 80 MG tablet Commonly known as: LIPITOR Take 1 tablet (80 mg total) by mouth daily.   budesonide 0.25 MG/2ML nebulizer solution Commonly known as: PULMICORT Use 1 vial (0.25 mg total) by nebulization 2 (two) times daily.   clopidogrel 75 MG tablet Commonly known as: PLAVIX TAKE 1 TABLET(75 MG) BY MOUTH DAILY   ipratropium-albuterol 0.5-2.5 (3) MG/3ML Soln Commonly known as: DUONEB Use 1 vial by nebulization 4 (four) times daily.   montelukast 10 MG tablet Commonly known as: SINGULAIR Take 1 tablet (10 mg total) by mouth at bedtime.   sertraline 25 MG tablet Commonly known as: ZOLOFT Take 1 tablet (25 mg total) by mouth at bedtime.   sulfamethoxazole-trimethoprim 800-160 MG tablet Commonly known as: BACTRIM DS Take 1 tablet by mouth every 12 (twelve) hours.  tamsulosin 0.4 MG Caps capsule Commonly known as: FLOMAX TAKE 1 CAPSULE(0.4 MG) BY MOUTH DAILY        Allergies:  Allergies  Allergen Reactions   Hctz [Hydrochlorothiazide] Rash    Family History: Family History  Problem Relation Age of Onset   Prostate cancer Neg Hx     Social History:  reports that he quit smoking about 14 months ago. His smoking use included cigarettes. He has a 1.20 pack-year smoking history. He has been exposed to tobacco smoke. He has never used smokeless tobacco. He reports current  alcohol use of about 2.0 standard drinks per week. He reports that he does not currently use drugs.   Physical Exam: BP (!) 130/57    Pulse 74    Ht 6' (1.829 m)    Wt 137 lb (62.1 kg)    BMI 18.58 kg/m   Constitutional:  Well nourished. Alert and oriented, No acute distress. HEENT: [place] AT, mask in place.  Trachea midline Cardiovascular: No clubbing, cyanosis, or edema. Respiratory: Normal respiratory effort, no increased work of breathing. GU: No CVA tenderness.  No bladder fullness or masses.  Patient with circumcised phallus.  Urethral meatus is patent.  No penile discharge.   A 2 mm lesion is located on the right lateral shaft of the penis.  Scrotum without lesions, cysts, rashes and/or edema.  Neurologic: Grossly intact, no focal deficits, moving all 4 extremities. Psychiatric: Normal mood and affect.   Laboratory Data: Lab Results  Component Value Date   CREATININE 1.23 11/14/2021   Component     Latest Ref Rng & Units 11/14/2021 12/13/2021           WBC     3.4 - 10.8 x10E3/uL 9.3   RBC     0 - 2 /hpf 4.11 (L) 0-2  Hemoglobin     13.0 - 17.7 g/dL 13.7   HCT     37.5 - 51.0 % 41.5   MCV     79 - 97 fL 101 (H)   MCH     26.6 - 33.0 pg 33.3 (H)   MCHC     31.5 - 35.7 g/dL 33.0   RDW     11.6 - 15.4 % 12.3   Platelets     150 - 450 x10E3/uL 190   nRBC     0.0 - 0.2 %    Neutrophils     Not Estab. % 86   NEUT#     1.4 - 7.0 x10E3/uL 7.9 (H)   Lymphocytes     %    Lymphocyte #     0.7 - 3.1 x10E3/uL 0.9   Monocytes Relative     %    Monocyte #     0.1 - 1.0 K/uL    Eosinophil     %    Eosinophils Absolute     0.0 - 0.5 K/uL    Basophil     %    Basophils Absolute     0.0 - 0.2 x10E3/uL 0.0   Immature Granulocytes     Not Estab. % 0   Abs Immature Granulocytes     0.00 - 0.07 K/uL    Lymphs     Not Estab. % 9   Monocytes     Not Estab. % 5   Eos     Not Estab. % 0   Basos     Not Estab. % 0   Monocytes Absolute  0.1 - 0.9 x10E3/uL 0.5   EOS  (ABSOLUTE)     0.0 - 0.4 x10E3/uL 0.0   Immature Grans (Abs)     0.0 - 0.1 x10E3/uL 0.0   pH, Arterial     7.350 - 7.450    pCO2 arterial     32.0 - 48.0 mmHg    pO2, Arterial     83.0 - 108.0 mmHg    Bicarbonate     20.0 - 28.0 mmol/L    TCO2     22 - 32 mmol/L    O2 Saturation     %    Acid-base deficit     0.0 - 2.0 mmol/L    Sodium     135 - 145 mmol/L    Potassium     3.5 - 5.1 mmol/L    Calcium Ionized     1.15 - 1.40 mmol/L    Patient temperature         Collection site         Drawn by         Sample type         Acid-Base Excess     0.0 - 2.0 mmol/L    pH, Ven     7.250 - 7.430    pCO2, Ven     44.0 - 60.0 mmHg    pO2, Ven     32.0 - 45.0 mmHg    Comment         WBC, UA     0 - 5 /hpf  0-5  Epithelial Cells (non renal)     0 - 10 /hpf  0-10  Bacteria, UA     None seen/Few  Few (A)    Results for orders placed or performed during the hospital encounter of 12/30/21  Urinalysis, Complete w Microscopic  Result Value Ref Range   Color, Urine YELLOW YELLOW   APPearance CLEAR CLEAR   Specific Gravity, Urine 1.025 1.005 - 1.030   pH 6.0 5.0 - 8.0   Glucose, UA NEGATIVE NEGATIVE mg/dL   Hgb urine dipstick TRACE (A) NEGATIVE   Bilirubin Urine NEGATIVE NEGATIVE   Ketones, ur NEGATIVE NEGATIVE mg/dL   Protein, ur NEGATIVE NEGATIVE mg/dL   Nitrite NEGATIVE NEGATIVE   Leukocytes,Ua NEGATIVE NEGATIVE   Squamous Epithelial / LPF 0-5 0 - 5   WBC, UA 0-5 0 - 5 WBC/hpf   RBC / HPF 0-5 0 - 5 RBC/hpf   Bacteria, UA RARE (A) NONE SEEN  I have reviewed the labs.   Assessment & Plan:    1. Incontinence -resolved after Septra DS, BID x 7 days   2. BPH with LUTS - stable -PSA aged out of screening -UA negative for micro heme     3. High risk hematuria -Hematuria work up completed in 2022 - NED -no reports of gross heme -UA negative for micro heme  4. Penile lesion  - Patient with small lesions he assumes is plugged hair follice discussed  possibility of herpes he has declined testing for this.   Return in about 1 year (around 12/30/2022) for IPSS, SHIM and exam.  [place] [address] [place], [place] [address] [phone]  [facility] as a scribe for [facility], PA-C.,have documented all relevant documentation on the behalf of Emberlyn Burlison, PA-C,as directed by  [facility], PA-C while in the presence of Keana Dueitt, PA-C.

## 2021-12-26 NOTE — Telephone Encounter (Signed)
Spoke to [place] at [facility]- pt wanted to be readmitted into the program. Gave the verbal okay. [phone]

## 2021-12-27 DIAGNOSIS — J449 Chronic obstructive pulmonary disease, unspecified: Secondary | ICD-10-CM | POA: Diagnosis not present

## 2021-12-30 ENCOUNTER — Other Ambulatory Visit: Payer: Self-pay

## 2021-12-30 ENCOUNTER — Other Ambulatory Visit
Admission: RE | Admit: 2021-12-30 | Discharge: 2021-12-30 | Disposition: A | Discharge status: Home / Self Care | Payer: Medicare HMO | Attending: Urology | Admitting: Urology

## 2021-12-30 ENCOUNTER — Other Ambulatory Visit: Payer: Self-pay | Admitting: *Deleted

## 2021-12-30 ENCOUNTER — Encounter: Payer: Self-pay | Admitting: Urology

## 2021-12-30 ENCOUNTER — Ambulatory Visit (INDEPENDENT_AMBULATORY_CARE_PROVIDER_SITE_OTHER): Payer: Medicare HMO | Admitting: Urology

## 2021-12-30 VITALS — BP 130/57 | HR 74 | Ht 72.0 in | Wt 137.0 lb

## 2021-12-30 DIAGNOSIS — R319 Hematuria, unspecified: Secondary | ICD-10-CM | POA: Insufficient documentation

## 2021-12-30 DIAGNOSIS — N401 Enlarged prostate with lower urinary tract symptoms: Secondary | ICD-10-CM | POA: Diagnosis not present

## 2021-12-30 DIAGNOSIS — N138 Other obstructive and reflux uropathy: Secondary | ICD-10-CM | POA: Diagnosis not present

## 2021-12-30 DIAGNOSIS — R32 Unspecified urinary incontinence: Secondary | ICD-10-CM

## 2021-12-30 DIAGNOSIS — N489 Disorder of penis, unspecified: Secondary | ICD-10-CM | POA: Diagnosis not present

## 2021-12-30 LAB — URINALYSIS, COMPLETE (UACMP) WITH MICROSCOPIC
Bilirubin Urine: NEGATIVE
Glucose, UA: NEGATIVE mg/dL
Ketones, ur: NEGATIVE mg/dL
Leukocytes,Ua: NEGATIVE
Nitrite: NEGATIVE
Protein, ur: NEGATIVE mg/dL
Specific Gravity, Urine: 1.025 (ref 1.005–1.030)
pH: 6 (ref 5.0–8.0)

## 2022-01-01 ENCOUNTER — Telehealth: Payer: Self-pay | Admitting: Urology

## 2022-01-01 NOTE — Telephone Encounter (Signed)
Pt called in and wants to speak with Larene Beach about "pimples". He said she would know what he was talking about. Informed pt he may not get called back today, possibly tomorrow.

## 2022-01-01 NOTE — Telephone Encounter (Signed)
Spoke with patient and he has a odor coming from his underwear. Pt advised that the "lesions" are not open and leaking. He did say his underwear are wet sometimes when he wakes up, but he doesn't know if he's urinating on himself? I advised it could happen when he's sleep.

## 2022-01-02 DIAGNOSIS — G250 Essential tremor: Secondary | ICD-10-CM | POA: Diagnosis not present

## 2022-01-02 DIAGNOSIS — E782 Mixed hyperlipidemia: Secondary | ICD-10-CM | POA: Diagnosis not present

## 2022-01-02 DIAGNOSIS — D649 Anemia, unspecified: Secondary | ICD-10-CM | POA: Diagnosis not present

## 2022-01-02 DIAGNOSIS — J432 Centrilobular emphysema: Secondary | ICD-10-CM | POA: Diagnosis not present

## 2022-01-02 DIAGNOSIS — N401 Enlarged prostate with lower urinary tract symptoms: Secondary | ICD-10-CM | POA: Diagnosis not present

## 2022-01-02 DIAGNOSIS — I6523 Occlusion and stenosis of bilateral carotid arteries: Secondary | ICD-10-CM | POA: Diagnosis not present

## 2022-01-02 DIAGNOSIS — R001 Bradycardia, unspecified: Secondary | ICD-10-CM | POA: Diagnosis not present

## 2022-01-02 DIAGNOSIS — I700 Atherosclerosis of aorta: Secondary | ICD-10-CM | POA: Diagnosis not present

## 2022-01-02 DIAGNOSIS — J9621 Acute and chronic respiratory failure with hypoxia: Secondary | ICD-10-CM | POA: Diagnosis not present

## 2022-01-02 DIAGNOSIS — R350 Frequency of micturition: Secondary | ICD-10-CM | POA: Diagnosis not present

## 2022-01-02 LAB — CYTOLOGY - NON PAP

## 2022-01-02 NOTE — Telephone Encounter (Signed)
Spoke with patient and he will try to drink more water. Per pt he is not wet every night/morning. Pt will call back if things don't change

## 2022-01-08 DIAGNOSIS — R001 Bradycardia, unspecified: Secondary | ICD-10-CM | POA: Diagnosis not present

## 2022-01-08 DIAGNOSIS — J432 Centrilobular emphysema: Secondary | ICD-10-CM | POA: Diagnosis not present

## 2022-01-08 DIAGNOSIS — G250 Essential tremor: Secondary | ICD-10-CM | POA: Diagnosis not present

## 2022-01-08 DIAGNOSIS — J9621 Acute and chronic respiratory failure with hypoxia: Secondary | ICD-10-CM | POA: Diagnosis not present

## 2022-01-08 DIAGNOSIS — N401 Enlarged prostate with lower urinary tract symptoms: Secondary | ICD-10-CM | POA: Diagnosis not present

## 2022-01-08 DIAGNOSIS — D649 Anemia, unspecified: Secondary | ICD-10-CM | POA: Diagnosis not present

## 2022-01-08 DIAGNOSIS — I6523 Occlusion and stenosis of bilateral carotid arteries: Secondary | ICD-10-CM | POA: Diagnosis not present

## 2022-01-08 DIAGNOSIS — R350 Frequency of micturition: Secondary | ICD-10-CM | POA: Diagnosis not present

## 2022-01-08 DIAGNOSIS — I700 Atherosclerosis of aorta: Secondary | ICD-10-CM | POA: Diagnosis not present

## 2022-01-08 DIAGNOSIS — E782 Mixed hyperlipidemia: Secondary | ICD-10-CM | POA: Diagnosis not present

## 2022-01-10 ENCOUNTER — Ambulatory Visit (INDEPENDENT_AMBULATORY_CARE_PROVIDER_SITE_OTHER): Payer: Medicare HMO | Admitting: Family Medicine

## 2022-01-10 ENCOUNTER — Other Ambulatory Visit: Payer: Self-pay

## 2022-01-10 ENCOUNTER — Encounter: Payer: Self-pay | Admitting: Family Medicine

## 2022-01-10 ENCOUNTER — Ambulatory Visit: Payer: Medicare HMO | Admitting: Family Medicine

## 2022-01-10 VITALS — BP 110/60 | HR 80 | Ht 72.0 in | Wt 132.0 lb

## 2022-01-10 DIAGNOSIS — I679 Cerebrovascular disease, unspecified: Secondary | ICD-10-CM | POA: Diagnosis not present

## 2022-01-10 DIAGNOSIS — E785 Hyperlipidemia, unspecified: Secondary | ICD-10-CM | POA: Diagnosis not present

## 2022-01-10 DIAGNOSIS — J4520 Mild intermittent asthma, uncomplicated: Secondary | ICD-10-CM

## 2022-01-10 MED ORDER — MONTELUKAST SODIUM 10 MG PO TABS: 10.0000 mg | ORAL_TABLET | Freq: Every day | ORAL | 1 refills | Status: AC

## 2022-01-10 MED ORDER — ATORVASTATIN CALCIUM 80 MG PO TABS: 80.0000 mg | ORAL_TABLET | Freq: Every day | ORAL | 1 refills | Status: AC

## 2022-01-10 NOTE — Progress Notes (Signed)
Date:  01/10/2022   Name:  Nathaniel Garcia   DOB:  March 11, 1946   MRN:  [id number]   Chief Complaint: Follow-up  Hyperlipidemia This is a chronic problem. The current episode started more than 1 year ago. The problem is controlled. Recent lipid tests were reviewed and are normal. He has no history of chronic renal disease, diabetes, hypothyroidism, liver disease, obesity or nephrotic syndrome. Pertinent negatives include no focal sensory loss, focal weakness, leg pain, myalgias or shortness of breath. Current antihyperlipidemic treatment includes statins.  URI  This is a recurrent (for allergic rhin) problem. The current episode started more than 1 year ago. The problem has been waxing and waning. There has been no fever. Associated symptoms include wheezing. Pertinent negatives include no congestion, coughing, diarrhea, nausea, rhinorrhea, sinus pain or sore throat.   Lab Results  Component Value Date   NA 141 11/14/2021   K 5.1 11/14/2021   CO2 24 11/14/2021   GLUCOSE 94 11/14/2021   BUN 16 11/14/2021   CREATININE 1.23 11/14/2021   CALCIUM 9.4 11/14/2021   EGFR 61 11/14/2021   GFRNONAA 58 (L) 10/31/2021   Lab Results  Component Value Date   CHOL 177 06/29/2018   HDL 54 06/29/2018   LDLCALC 110 (H) 06/29/2018   TRIG 64 06/29/2018   CHOLHDL 3.3 06/29/2018   Lab Results  Component Value Date   TSH 1.650 05/30/2019   No results found for: HGBA1C Lab Results  Component Value Date   WBC 9.3 11/14/2021   HGB 13.7 11/14/2021   HCT 41.5 11/14/2021   MCV 101 (H) 11/14/2021   PLT 190 11/14/2021   Lab Results  Component Value Date   ALT 17 08/28/2021   AST 15 08/28/2021   ALKPHOS 45 08/28/2021   BILITOT 0.6 08/28/2021   No results found for: 25OHVITD2, 25OHVITD3, VD25OH   Review of Systems  HENT:  Negative for congestion, rhinorrhea, sinus pain and sore throat.   Respiratory:  Positive for wheezing. Negative for cough and shortness of breath.        [place]   Cardiovascular:  Negative for palpitations and leg swelling.  Gastrointestinal:  Negative for diarrhea and nausea.  Musculoskeletal:  Negative for myalgias.  Neurological:  Negative for focal weakness.   Patient Active Problem List   Diagnosis Date Noted   Junctional escape rhythm 10/30/2021   Abnormal EKG 10/30/2021   Acute and chronic respiratory failure with hypoxia ([place]) 10/30/2021   COPD with acute exacerbation ([place]) 01/03/2021   Bullous emphysema ([place]) 01/03/2021   Acute exacerbation of chronic obstructive pulmonary disease (COPD) ([place]) 10/31/2020   COPD exacerbation ([place]) 10/29/2020   Acute respiratory failure with hypoxia ([place]) 10/29/2020   History of stroke 10/29/2020   Aortic atherosclerosis ([place]) 12/30/2018   Benign hematuria 09/18/2016   Weakness of left hand 07/02/2016   Lipid screening 01/03/2016   Cerebral vascular disease 09/14/2015   Carotid artery narrowing 08/15/2015   Bilateral carotid artery stenosis 08/15/2015   Bradycardia 08/01/2015   H/O transient cerebral ischemia 08/01/2015   Combined fat and carbohydrate induced hyperlipemia 08/01/2015   Breathlessness on exertion 07/30/2015   Absolute anemia 04/03/2015   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[place]) 04/03/2015   Cerebral vascular accident ([place]) 04/03/2015   HLD (hyperlipidemia) 04/03/2015   Blood glucose elevated 04/03/2015   Lung nodule, solitary 04/03/2015   Non compliance with medical treatment 04/03/2015   Lung nodule, multiple 04/03/2015   Cerebral infarction ([place]) 04/03/2015   Abnormal lung field 04/03/2015  Allergies  Allergen Reactions   Hctz [Hydrochlorothiazide] Rash    Past Surgical History:  Procedure Laterality Date   COLONOSCOPY  2011   normal- MD docs    Social History   Tobacco Use   Smoking status: Former    Packs/day: 0.02    Years: 60.00    Pack years: 1.20    Types: Cigarettes    Quit date: 10/26/2020    Years since quitting: 1.2    Passive exposure: Past   Smokeless  tobacco: Never   Tobacco comments:    [place] smoking cessation information provided  Vaping Use   Vaping Use: Never used  Substance Use Topics   Alcohol use: Yes    Alcohol/week: 2.0 standard drinks    Types: 2 Cans of beer per week    Comment: weekly   Drug use: Not Currently    Comment: marijuana     Medication list has been reviewed and updated.  Current Meds  Medication Sig   albuterol (VENTOLIN HFA) 108 (90 Base) MCG/ACT inhaler Inhale 2 puffs into the lungs every 6 (six) hours as needed for wheezing or shortness of breath.   atorvastatin (LIPITOR) 80 MG tablet Take 1 tablet (80 mg total) by mouth daily.   budesonide (PULMICORT) 0.25 MG/2ML nebulizer solution Use 1 vial (0.25 mg total) by nebulization 2 (two) times daily.   clopidogrel (PLAVIX) 75 MG tablet TAKE 1 TABLET(75 MG) BY MOUTH DAILY   ipratropium-albuterol (DUONEB) 0.5-2.5 (3) MG/3ML SOLN Use 1 vial by nebulization 4 (four) times daily.   montelukast (SINGULAIR) 10 MG tablet Take 1 tablet (10 mg total) by mouth at bedtime.   sertraline (ZOLOFT) 25 MG tablet Take 1 tablet (25 mg total) by mouth at bedtime.   tamsulosin (FLOMAX) 0.4 MG CAPS capsule TAKE 1 CAPSULE(0.4 MG) BY MOUTH DAILY    PHQ 2/9 Scores 01/10/2022 10/09/2021 08/01/2021 07/12/2021  PHQ - 2 Score 0 0 0 0  PHQ- 9 Score 0 0 0 2    GAD 7 : Generalized Anxiety Score 01/10/2022 10/09/2021 08/01/2021 07/12/2021  Nervous, Anxious, on Edge 0 0 0 1  Control/stop worrying 0 0 0 0  Worry too much - different things 0 0 0 0  Trouble relaxing 0 0 0 1  Restless 0 0 0 1  Easily annoyed or irritable 0 0 0 0  Afraid - awful might happen 0 0 0 0  Total GAD 7 Score 0 0 0 3  Anxiety Difficulty Not difficult at all Not difficult at all - Somewhat difficult    BP Readings from Last 3 Encounters:  01/10/22 110/60  12/30/21 (!) 130/57  12/09/21 (!) 130/54    Physical Exam Vitals and nursing note reviewed.  HENT:     Head: Normocephalic.     Right Ear:  Tympanic membrane and external ear normal.     Left Ear: Tympanic membrane and external ear normal.     Nose: Nose normal. No congestion or rhinorrhea.  Eyes:     General: No scleral icterus.       Right eye: No discharge.        Left eye: No discharge.     Conjunctiva/sclera: Conjunctivae normal.     Pupils: Pupils are equal, round, and reactive to light.  Neck:     Thyroid: No thyromegaly.     Vascular: No JVD.     Trachea: No tracheal deviation.  Cardiovascular:     Rate and Rhythm: Normal rate and regular rhythm.  Heart sounds: Normal heart sounds. No murmur heard.   No friction rub. No gallop.  Pulmonary:     Effort: No respiratory distress.     Breath sounds: Normal breath sounds. No wheezing, rhonchi or rales.  Abdominal:     General: Bowel sounds are normal.     Palpations: Abdomen is soft. There is no mass.     Tenderness: There is no abdominal tenderness. There is no guarding or rebound.  Musculoskeletal:        General: No tenderness. Normal range of motion.     Cervical back: Normal range of motion and neck supple.  Lymphadenopathy:     Cervical: No cervical adenopathy.  Skin:    General: Skin is warm.     Findings: No rash.  Neurological:     Mental Status: He is alert and oriented to person, place, and time.     Cranial Nerves: No cranial nerve deficit.     Deep Tendon Reflexes: Reflexes are normal and symmetric.    Wt Readings from Last 3 Encounters:  01/10/22 132 lb (59.9 kg)  12/30/21 137 lb (62.1 kg)  12/09/21 136 lb (61.7 kg)    BP 110/60    Pulse 80    Ht 6' (1.829 m)    Wt 132 lb (59.9 kg)    BMI 17.90 kg/m   Assessment and Plan:  1. Hyperlipidemia, unspecified hyperlipidemia type Chronic.  Controlled.  Stable.  Patient currently is on atorvastatin 80 mg once a day.  We will check current lipid panel to assess LDL today. - Lipid Panel With LDL/HDL Ratio - atorvastatin (LIPITOR) 80 MG tablet; Take 1 tablet (80 mg total) by mouth daily.   Dispense: 90 tablet; Refill: 1  2. Mild intermittent reactive airway disease without complication Chronic.  Controlled.  Stable.  Currently in allergy season and patient is tolerating well patient does have severe COPD and we are using Singulair for as much allergic rhinitis as well as to aid in any possible way with his reactive airway disease. - montelukast (SINGULAIR) 10 MG tablet; Take 1 tablet (10 mg total) by mouth at bedtime.  Dispense: 90 tablet; Refill: 1  3. Cerebral vascular disease History of cerebrovascular disease in the past.  We will control with monitoring of blood pressure normal range at 110/60, maintaining current weight BMI 17, and reducing LDL. - atorvastatin (LIPITOR) 80 MG tablet; Take 1 tablet (80 mg total) by mouth daily.  Dispense: 90 tablet; Refill: 1

## 2022-01-11 LAB — LIPID PANEL WITH LDL/HDL RATIO
Cholesterol, Total: 183 mg/dL (ref 100–199)
HDL: 61 mg/dL (ref >39)
LDL Chol Calc (NIH): 110 mg/dL — ABNORMAL HIGH (ref 0–99)
LDL/HDL Ratio: 1.8 ratio (ref 0.0–3.6)
Triglycerides: 65 mg/dL (ref 0–149)
VLDL Cholesterol Cal: 12 mg/dL (ref 5–40)

## 2022-01-13 DIAGNOSIS — J9621 Acute and chronic respiratory failure with hypoxia: Secondary | ICD-10-CM | POA: Diagnosis not present

## 2022-01-13 DIAGNOSIS — I6523 Occlusion and stenosis of bilateral carotid arteries: Secondary | ICD-10-CM | POA: Diagnosis not present

## 2022-01-13 DIAGNOSIS — J432 Centrilobular emphysema: Secondary | ICD-10-CM | POA: Diagnosis not present

## 2022-01-13 DIAGNOSIS — N401 Enlarged prostate with lower urinary tract symptoms: Secondary | ICD-10-CM | POA: Diagnosis not present

## 2022-01-13 DIAGNOSIS — I700 Atherosclerosis of aorta: Secondary | ICD-10-CM | POA: Diagnosis not present

## 2022-01-13 DIAGNOSIS — E782 Mixed hyperlipidemia: Secondary | ICD-10-CM | POA: Diagnosis not present

## 2022-01-13 DIAGNOSIS — R350 Frequency of micturition: Secondary | ICD-10-CM | POA: Diagnosis not present

## 2022-01-13 DIAGNOSIS — D649 Anemia, unspecified: Secondary | ICD-10-CM | POA: Diagnosis not present

## 2022-01-13 DIAGNOSIS — R001 Bradycardia, unspecified: Secondary | ICD-10-CM | POA: Diagnosis not present

## 2022-01-13 DIAGNOSIS — G250 Essential tremor: Secondary | ICD-10-CM | POA: Diagnosis not present

## 2022-01-14 DIAGNOSIS — R0609 Other forms of dyspnea: Secondary | ICD-10-CM | POA: Diagnosis not present

## 2022-01-14 DIAGNOSIS — J9601 Acute respiratory failure with hypoxia: Secondary | ICD-10-CM | POA: Diagnosis not present

## 2022-01-14 DIAGNOSIS — Z9981 Dependence on supplemental oxygen: Secondary | ICD-10-CM | POA: Diagnosis not present

## 2022-01-23 ENCOUNTER — Other Ambulatory Visit: Payer: Self-pay | Admitting: Urology

## 2022-01-23 DIAGNOSIS — R350 Frequency of micturition: Secondary | ICD-10-CM | POA: Diagnosis not present

## 2022-01-23 DIAGNOSIS — I700 Atherosclerosis of aorta: Secondary | ICD-10-CM | POA: Diagnosis not present

## 2022-01-23 DIAGNOSIS — J439 Emphysema, unspecified: Secondary | ICD-10-CM | POA: Diagnosis not present

## 2022-01-23 DIAGNOSIS — D649 Anemia, unspecified: Secondary | ICD-10-CM | POA: Diagnosis not present

## 2022-01-23 DIAGNOSIS — G250 Essential tremor: Secondary | ICD-10-CM | POA: Diagnosis not present

## 2022-01-23 DIAGNOSIS — R001 Bradycardia, unspecified: Secondary | ICD-10-CM | POA: Diagnosis not present

## 2022-01-23 DIAGNOSIS — N401 Enlarged prostate with lower urinary tract symptoms: Secondary | ICD-10-CM | POA: Diagnosis not present

## 2022-01-23 DIAGNOSIS — J9621 Acute and chronic respiratory failure with hypoxia: Secondary | ICD-10-CM | POA: Diagnosis not present

## 2022-01-23 DIAGNOSIS — J449 Chronic obstructive pulmonary disease, unspecified: Secondary | ICD-10-CM | POA: Diagnosis not present

## 2022-01-23 DIAGNOSIS — J441 Chronic obstructive pulmonary disease with (acute) exacerbation: Secondary | ICD-10-CM | POA: Diagnosis not present

## 2022-01-23 DIAGNOSIS — G4733 Obstructive sleep apnea (adult) (pediatric): Secondary | ICD-10-CM | POA: Diagnosis not present

## 2022-01-23 DIAGNOSIS — I6523 Occlusion and stenosis of bilateral carotid arteries: Secondary | ICD-10-CM | POA: Diagnosis not present

## 2022-01-23 DIAGNOSIS — E782 Mixed hyperlipidemia: Secondary | ICD-10-CM | POA: Diagnosis not present

## 2022-01-23 DIAGNOSIS — J432 Centrilobular emphysema: Secondary | ICD-10-CM | POA: Diagnosis not present

## 2022-01-24 DIAGNOSIS — J449 Chronic obstructive pulmonary disease, unspecified: Secondary | ICD-10-CM | POA: Diagnosis not present

## 2022-01-30 DIAGNOSIS — R350 Frequency of micturition: Secondary | ICD-10-CM | POA: Diagnosis not present

## 2022-01-30 DIAGNOSIS — E782 Mixed hyperlipidemia: Secondary | ICD-10-CM | POA: Diagnosis not present

## 2022-01-30 DIAGNOSIS — J432 Centrilobular emphysema: Secondary | ICD-10-CM | POA: Diagnosis not present

## 2022-01-30 DIAGNOSIS — G250 Essential tremor: Secondary | ICD-10-CM | POA: Diagnosis not present

## 2022-01-30 DIAGNOSIS — I700 Atherosclerosis of aorta: Secondary | ICD-10-CM | POA: Diagnosis not present

## 2022-01-30 DIAGNOSIS — N401 Enlarged prostate with lower urinary tract symptoms: Secondary | ICD-10-CM | POA: Diagnosis not present

## 2022-01-30 DIAGNOSIS — J9621 Acute and chronic respiratory failure with hypoxia: Secondary | ICD-10-CM | POA: Diagnosis not present

## 2022-01-30 DIAGNOSIS — D649 Anemia, unspecified: Secondary | ICD-10-CM | POA: Diagnosis not present

## 2022-01-30 DIAGNOSIS — R001 Bradycardia, unspecified: Secondary | ICD-10-CM | POA: Diagnosis not present

## 2022-01-30 DIAGNOSIS — I6523 Occlusion and stenosis of bilateral carotid arteries: Secondary | ICD-10-CM | POA: Diagnosis not present

## 2022-02-06 ENCOUNTER — Ambulatory Visit: Payer: Medicare HMO | Admitting: Family Medicine

## 2022-02-11 ENCOUNTER — Other Ambulatory Visit: Payer: Self-pay

## 2022-02-11 ENCOUNTER — Encounter: Payer: Self-pay | Admitting: Family Medicine

## 2022-02-11 ENCOUNTER — Ambulatory Visit (INDEPENDENT_AMBULATORY_CARE_PROVIDER_SITE_OTHER): Payer: Medicare HMO | Admitting: Family Medicine

## 2022-02-11 VITALS — BP 120/60 | HR 88 | Ht 72.0 in | Wt 137.0 lb

## 2022-02-11 DIAGNOSIS — L739 Follicular disorder, unspecified: Secondary | ICD-10-CM

## 2022-02-11 DIAGNOSIS — L821 Other seborrheic keratosis: Secondary | ICD-10-CM

## 2022-02-11 MED ORDER — MUPIROCIN 2 % EX OINT: 1.0000 "application " | TOPICAL_OINTMENT | Freq: Two times a day (BID) | CUTANEOUS | 0 refills | Status: DC

## 2022-02-11 MED ORDER — DOXYCYCLINE HYCLATE 100 MG PO TABS: 100.0000 mg | ORAL_TABLET | Freq: Every day | ORAL | 1 refills | Status: DC

## 2022-02-11 NOTE — Progress Notes (Signed)
? ? ?Date:  02/11/2022  ? ?Name:  Nathaniel Garcia   DOB:  April 25, 1946   MRN:  [id number] ? ? ?Chief Complaint: Rash (Places on back that itch) ? ?Rash ?This is a chronic problem. The current episode started more than 1 year ago. The problem has been gradually worsening since onset. The rash is diffuse. Rash characteristics: pigmented. The treatment provided moderate relief.  ? ?Lab Results  ?Component Value Date  ? NA 141 11/14/2021  ? K 5.1 11/14/2021  ? CO2 24 11/14/2021  ? GLUCOSE 94 11/14/2021  ? BUN 16 11/14/2021  ? CREATININE 1.23 11/14/2021  ? CALCIUM 9.4 11/14/2021  ? EGFR 61 11/14/2021  ? GFRNONAA 58 (L) 10/31/2021  ? ?Lab Results  ?Component Value Date  ? CHOL 183 01/10/2022  ? HDL 61 01/10/2022  ? LDLCALC 110 (H) 01/10/2022  ? TRIG 65 01/10/2022  ? CHOLHDL 3.3 06/29/2018  ? ?Lab Results  ?Component Value Date  ? TSH 1.650 05/30/2019  ? ?No results found for: HGBA1C ?Lab Results  ?Component Value Date  ? WBC 9.3 11/14/2021  ? HGB 13.7 11/14/2021  ? HCT 41.5 11/14/2021  ? MCV 101 (H) 11/14/2021  ? PLT 190 11/14/2021  ? ?Lab Results  ?Component Value Date  ? ALT 17 08/28/2021  ? AST 15 08/28/2021  ? ALKPHOS 45 08/28/2021  ? BILITOT 0.6 08/28/2021  ? ?No results found for: 25OHVITD2, [place], VD25OH  ? ?Review of Systems  ?Skin:  Positive for rash.  ? ?Patient Active Problem List  ? Diagnosis Date Noted  ? Junctional escape rhythm 10/30/2021  ? Abnormal EKG 10/30/2021  ? Acute and chronic respiratory failure with hypoxia ([place]) 10/30/2021  ? COPD with acute exacerbation ([place]) 01/03/2021  ? Bullous emphysema ([place]) 01/03/2021  ? Acute exacerbation of chronic obstructive pulmonary disease (COPD) ([place]) 10/31/2020  ? COPD exacerbation ([place]) 10/29/2020  ? Acute respiratory failure with hypoxia ([place]) 10/29/2020  ? History of stroke 10/29/2020  ? Aortic atherosclerosis ([place]) 12/30/2018  ? Benign hematuria 09/18/2016  ? Weakness of left hand 07/02/2016  ? Lipid screening 01/03/2016  ? Cerebral vascular disease  09/14/2015  ? Carotid artery narrowing 08/15/2015  ? Bilateral carotid artery stenosis 08/15/2015  ? Bradycardia 08/01/2015  ? H/O transient cerebral ischemia 08/01/2015  ? Combined fat and carbohydrate induced hyperlipemia 08/01/2015  ? Breathlessness on exertion 07/30/2015  ? Absolute anemia 04/03/2015  ? 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[place]) 04/03/2015  ? Cerebral vascular accident ([place]) 04/03/2015  ? HLD (hyperlipidemia) 04/03/2015  ? Blood glucose elevated 04/03/2015  ? Lung nodule, solitary 04/03/2015  ? Non compliance with medical treatment 04/03/2015  ? Lung nodule, multiple 04/03/2015  ? Cerebral infarction ([place]) 04/03/2015  ? Abnormal lung field 04/03/2015  ? ? ?Allergies  ?Allergen Reactions  ? Hctz [Hydrochlorothiazide] Rash  ? ? ?Past Surgical History:  ?Procedure Laterality Date  ? COLONOSCOPY  2011  ? normal- MD docs  ? ? ?Social History  ? ?Tobacco Use  ? Smoking status: Former  ?  Packs/day: 0.02  ?  Years: 60.00  ?  Pack years: 1.20  ?  Types: Cigarettes  ?  Quit date: 10/26/2020  ?  Years since quitting: 1.2  ?  Passive exposure: Past  ? Smokeless tobacco: Never  ? Tobacco comments:  ?  [place] smoking cessation information provided  ?Vaping Use  ? Vaping Use: Never used  ?Substance Use Topics  ? Alcohol use: Yes  ?  Alcohol/week: 2.0 standard drinks  ?  Types: 2  Cans of beer per week  ?  Comment: weekly  ? Drug use: Not Currently  ?  Comment: marijuana  ? ? ? ?Medication list has been reviewed and updated. ? ?Current Meds  ?Medication Sig  ? albuterol (VENTOLIN HFA) 108 (90 Base) MCG/ACT inhaler Inhale 2 puffs into the lungs every 6 (six) hours as needed for wheezing or shortness of breath.  ? atorvastatin (LIPITOR) 80 MG tablet Take 1 tablet (80 mg total) by mouth daily.  ? budesonide (PULMICORT) 0.25 MG/2ML nebulizer solution Use 1 vial (0.25 mg total) by nebulization 2 (two) times daily.  ? clopidogrel (PLAVIX) 75 MG tablet TAKE 1 TABLET(75 MG) BY MOUTH DAILY  ? ipratropium-albuterol (DUONEB)  0.5-2.5 (3) MG/3ML SOLN Use 1 vial by nebulization 4 (four) times daily.  ? montelukast (SINGULAIR) 10 MG tablet Take 1 tablet (10 mg total) by mouth at bedtime.  ? sertraline (ZOLOFT) 25 MG tablet Take 1 tablet (25 mg total) by mouth at bedtime.  ? tamsulosin (FLOMAX) 0.4 MG CAPS capsule TAKE 1 CAPSULE(0.4 MG) BY MOUTH DAILY  ? ? ? ?  01/10/2022  ? 10:16 AM 10/09/2021  ? 10:37 AM 08/01/2021  ? 10:00 AM 07/12/2021  ? 10:32 AM  ?PHQ 2/9 Scores  ?PHQ - 2 Score 0 0 0 0  ?PHQ- 9 Score 0 0 0 2  ? ? ? ?  01/10/2022  ? 10:16 AM 10/09/2021  ? 10:37 AM 08/01/2021  ? 10:01 AM 07/12/2021  ? 10:34 AM  ?GAD 7 : Generalized Anxiety Score  ?Nervous, Anxious, on Edge 0 0 0 1  ?Control/stop worrying 0 0 0 0  ?Worry too much - different things 0 0 0 0  ?Trouble relaxing 0 0 0 1  ?Restless 0 0 0 1  ?Easily annoyed or irritable 0 0 0 0  ?Afraid - awful might happen 0 0 0 0  ?Total GAD 7 Score 0 0 0 3  ?Anxiety Difficulty Not difficult at all Not difficult at all  Somewhat difficult  ? ? ?BP Readings from Last 3 Encounters:  ?02/11/22 120/60  ?01/10/22 110/60  ?12/30/21 (!) 130/57  ? ? ?Physical Exam ? ?Wt Readings from Last 3 Encounters:  ?02/11/22 137 lb (62.1 kg)  ?01/10/22 132 lb (59.9 kg)  ?12/30/21 137 lb (62.1 kg)  ? ? ?BP 120/60   Pulse 88   Ht 6' (1.829 m)   Wt 137 lb (62.1 kg)   BMI 18.58 kg/m?  ? ?Assessment and Plan: ? ?1. Seborrheic keratoses ?Chronic.  Persistent.  Generalized over the primarily of the torso.  Multiple seborrheic keratoses it we will bring to the attention of dermatology and patient desires to know his options for reduction of these areas. ?- Ambulatory referral to Dermatology ? ?2. Folliculitis ?New onset.  Persistent.  Patient has areas on the scrotal area that are secondarily infected.  I have instructed him to pick up some Hibiclens in addition we will put him on doxycycline 100 mg once a day and use of Bactroban to these areas when they get secondarily infected and open and draining. ?- doxycycline  (VIBRA-TABS) 100 MG tablet; Take 1 tablet (100 mg total) by mouth daily.  Dispense: 30 tablet; Refill: 1 ?- mupirocin ointment (BACTROBAN) 2 %; Apply 1 application. topically 2 (two) times daily.  Dispense: 22 g; Refill: 0  ? ? ?

## 2022-02-19 DIAGNOSIS — L298 Other pruritus: Secondary | ICD-10-CM | POA: Diagnosis not present

## 2022-02-19 DIAGNOSIS — L291 Pruritus scroti: Secondary | ICD-10-CM | POA: Diagnosis not present

## 2022-02-19 DIAGNOSIS — L821 Other seborrheic keratosis: Secondary | ICD-10-CM | POA: Diagnosis not present

## 2022-02-20 DIAGNOSIS — R0609 Other forms of dyspnea: Secondary | ICD-10-CM | POA: Diagnosis not present

## 2022-02-20 DIAGNOSIS — Z9981 Dependence on supplemental oxygen: Secondary | ICD-10-CM | POA: Diagnosis not present

## 2022-02-20 DIAGNOSIS — J449 Chronic obstructive pulmonary disease, unspecified: Secondary | ICD-10-CM | POA: Diagnosis not present

## 2022-02-23 DIAGNOSIS — J449 Chronic obstructive pulmonary disease, unspecified: Secondary | ICD-10-CM | POA: Diagnosis not present

## 2022-02-23 DIAGNOSIS — G4733 Obstructive sleep apnea (adult) (pediatric): Secondary | ICD-10-CM | POA: Diagnosis not present

## 2022-02-23 DIAGNOSIS — J441 Chronic obstructive pulmonary disease with (acute) exacerbation: Secondary | ICD-10-CM | POA: Diagnosis not present

## 2022-02-23 DIAGNOSIS — J439 Emphysema, unspecified: Secondary | ICD-10-CM | POA: Diagnosis not present

## 2022-03-25 DIAGNOSIS — G4733 Obstructive sleep apnea (adult) (pediatric): Secondary | ICD-10-CM | POA: Diagnosis not present

## 2022-03-25 DIAGNOSIS — J449 Chronic obstructive pulmonary disease, unspecified: Secondary | ICD-10-CM | POA: Diagnosis not present

## 2022-03-25 DIAGNOSIS — J439 Emphysema, unspecified: Secondary | ICD-10-CM | POA: Diagnosis not present

## 2022-03-25 DIAGNOSIS — J441 Chronic obstructive pulmonary disease with (acute) exacerbation: Secondary | ICD-10-CM | POA: Diagnosis not present

## 2022-03-27 ENCOUNTER — Ambulatory Visit (INDEPENDENT_AMBULATORY_CARE_PROVIDER_SITE_OTHER): Payer: Medicare HMO | Admitting: Family Medicine

## 2022-03-27 ENCOUNTER — Encounter: Payer: Self-pay | Admitting: Family Medicine

## 2022-03-27 VITALS — BP 110/80 | HR 64 | Ht 72.0 in | Wt 137.0 lb

## 2022-03-27 DIAGNOSIS — L739 Follicular disorder, unspecified: Secondary | ICD-10-CM | POA: Diagnosis not present

## 2022-03-27 MED ORDER — MUPIROCIN 2 % EX OINT: 1.0000 "application " | TOPICAL_OINTMENT | Freq: Two times a day (BID) | CUTANEOUS | 0 refills | Status: AC

## 2022-03-27 NOTE — Progress Notes (Signed)
? ? ?Date:  03/27/2022  ? ?Name:  Nathaniel Garcia   DOB:  07-Jan-1946   MRN:  [id number] ? ? ?Chief Complaint: Follow-up (Was put on doxy and mupirocin cream for a "pimple on penis" in March. Has not got any better and now it "smells") ? ?HPI ? ?Lab Results  ?Component Value Date  ? NA 141 11/14/2021  ? K 5.1 11/14/2021  ? CO2 24 11/14/2021  ? GLUCOSE 94 11/14/2021  ? BUN 16 11/14/2021  ? CREATININE 1.23 11/14/2021  ? CALCIUM 9.4 11/14/2021  ? EGFR 61 11/14/2021  ? GFRNONAA 58 (L) 10/31/2021  ? ?Lab Results  ?Component Value Date  ? CHOL 183 01/10/2022  ? HDL 61 01/10/2022  ? LDLCALC 110 (H) 01/10/2022  ? TRIG 65 01/10/2022  ? CHOLHDL 3.3 06/29/2018  ? ?Lab Results  ?Component Value Date  ? TSH 1.650 05/30/2019  ? ?No results found for: HGBA1C ?Lab Results  ?Component Value Date  ? WBC 9.3 11/14/2021  ? HGB 13.7 11/14/2021  ? HCT 41.5 11/14/2021  ? MCV 101 (H) 11/14/2021  ? PLT 190 11/14/2021  ? ?Lab Results  ?Component Value Date  ? ALT 17 08/28/2021  ? AST 15 08/28/2021  ? ALKPHOS 45 08/28/2021  ? BILITOT 0.6 08/28/2021  ? ?No results found for: 25OHVITD2, [place], VD25OH  ? ?Review of Systems  ?Constitutional:  Negative for chills and fever.  ?HENT:  Negative for drooling, ear discharge, ear pain and sore throat.   ?Respiratory:  Negative for cough, shortness of breath and wheezing.   ?Cardiovascular:  Negative for chest pain, palpitations and leg swelling.  ?Gastrointestinal:  Negative for abdominal pain, blood in stool, constipation, diarrhea and nausea.  ?Endocrine: Negative for polydipsia.  ?Genitourinary:  Negative for dysuria, frequency, hematuria and urgency.  ?Musculoskeletal:  Negative for back pain, myalgias and neck pain.  ?Skin:  Negative for rash.  ?Allergic/Immunologic: Negative for environmental allergies.  ?Neurological:  Negative for dizziness and headaches.  ?Hematological:  Does not bruise/bleed easily.  ?Psychiatric/Behavioral:  Negative for suicidal ideas. The patient is not  nervous/anxious.   ? ?Patient Active Problem List  ? Diagnosis Date Noted  ? Junctional escape rhythm 10/30/2021  ? Abnormal EKG 10/30/2021  ? Acute and chronic respiratory failure with hypoxia ([place]) 10/30/2021  ? COPD with acute exacerbation ([place]) 01/03/2021  ? Bullous emphysema ([place]) 01/03/2021  ? Acute exacerbation of chronic obstructive pulmonary disease (COPD) ([place]) 10/31/2020  ? COPD exacerbation ([place]) 10/29/2020  ? Acute respiratory failure with hypoxia ([place]) 10/29/2020  ? History of stroke 10/29/2020  ? Aortic atherosclerosis ([place]) 12/30/2018  ? Benign hematuria 09/18/2016  ? Weakness of left hand 07/02/2016  ? Lipid screening 01/03/2016  ? Cerebral vascular disease 09/14/2015  ? Carotid artery narrowing 08/15/2015  ? Bilateral carotid artery stenosis 08/15/2015  ? Bradycardia 08/01/2015  ? H/O transient cerebral ischemia 08/01/2015  ? Combined fat and carbohydrate induced hyperlipemia 08/01/2015  ? Breathlessness on exertion 07/30/2015  ? Absolute anemia 04/03/2015  ? 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[place]) 04/03/2015  ? Cerebral vascular accident ([place]) 04/03/2015  ? HLD (hyperlipidemia) 04/03/2015  ? Blood glucose elevated 04/03/2015  ? Lung nodule, solitary 04/03/2015  ? Non compliance with medical treatment 04/03/2015  ? Lung nodule, multiple 04/03/2015  ? Cerebral infarction ([place]) 04/03/2015  ? Abnormal lung field 04/03/2015  ? ? ?Allergies  ?Allergen Reactions  ? Hctz [Hydrochlorothiazide] Rash  ? ? ?Past Surgical History:  ?Procedure Laterality Date  ? COLONOSCOPY  2011  ? normal- MD docs  ? ? ?  Social History  ? ?Tobacco Use  ? Smoking status: Former  ?  Packs/day: 0.02  ?  Years: 60.00  ?  Pack years: 1.20  ?  Types: Cigarettes  ?  Quit date: 10/26/2020  ?  Years since quitting: 1.4  ?  Passive exposure: Past  ? Smokeless tobacco: Never  ? Tobacco comments:  ?   smoking cessation information provided  ?Vaping Use  ? Vaping Use: Never used  ?Substance Use Topics  ? Alcohol use: Yes  ?  Alcohol/week:  2.0 standard drinks  ?  Types: 2 Cans of beer per week  ?  Comment: weekly  ? Drug use: Not Currently  ?  Comment: marijuana  ? ? ? ?Medication list has been reviewed and updated. ? ?Current Meds  ?Medication Sig  ? albuterol (VENTOLIN HFA) 108 (90 Base) MCG/ACT inhaler Inhale 2 puffs into the lungs every 6 (six) hours as needed for wheezing or shortness of breath.  ? atorvastatin (LIPITOR) 80 MG tablet Take 1 tablet (80 mg total) by mouth daily.  ? budesonide (PULMICORT) 0.25 MG/2ML nebulizer solution Use 1 vial (0.25 mg total) by nebulization 2 (two) times daily.  ? clopidogrel (PLAVIX) 75 MG tablet TAKE 1 TABLET(75 MG) BY MOUTH DAILY  ? ipratropium-albuterol (DUONEB) 0.5-2.5 (3) MG/3ML SOLN Use 1 vial by nebulization 4 (four) times daily.  ? montelukast (SINGULAIR) 10 MG tablet Take 1 tablet (10 mg total) by mouth at bedtime.  ? mupirocin ointment (BACTROBAN) 2 % Apply 1 application. topically 2 (two) times daily.  ? sertraline (ZOLOFT) 25 MG tablet Take 1 tablet (25 mg total) by mouth at bedtime.  ? tamsulosin (FLOMAX) 0.4 MG CAPS capsule TAKE 1 CAPSULE(0.4 MG) BY MOUTH DAILY  ? ? ? ?  03/27/2022  ?  9:11 AM 01/10/2022  ? 10:16 AM 10/09/2021  ? 10:37 AM 08/01/2021  ? 10:01 AM  ?GAD 7 : Generalized Anxiety Score  ?Nervous, Anxious, on Edge 0 0 0 0  ?Control/stop worrying 0 0 0 0  ?Worry too much - different things 0 0 0 0  ?Trouble relaxing 0 0 0 0  ?Restless 0 0 0 0  ?Easily annoyed or irritable 0 0 0 0  ?Afraid - awful might happen 0 0 0 0  ?Total GAD 7 Score 0 0 0 0  ?Anxiety Difficulty Not difficult at all Not difficult at all Not difficult at all   ? ? ? ?  03/27/2022  ?  9:11 AM  ?Depression screen PHQ 2/9  ?Decreased Interest 0  ?Down, Depressed, Hopeless 0  ?PHQ - 2 Score 0  ?Altered sleeping 0  ?Tired, decreased energy 0  ?Change in appetite 0  ?Feeling bad or failure about yourself  0  ?Trouble concentrating 0  ?Moving slowly or fidgety/restless 0  ?Suicidal thoughts 0  ?PHQ-9 Score 0  ?Difficult doing  work/chores Not difficult at all  ? ? ?BP Readings from Last 3 Encounters:  ?03/27/22 110/80  ?02/11/22 120/60  ?01/10/22 110/60  ? ? ?Physical Exam ?Vitals and nursing note reviewed.  ?Neck:  ?   Thyroid: No thyroid mass, thyromegaly or thyroid tenderness.  ?Cardiovascular:  ?   Pulses: Normal pulses.  ?   Heart sounds: S1 normal and S2 normal.  ?No systolic murmur is present.  ?No diastolic murmur is present.  ?  No gallop. No S3 or S4 sounds.  ?Genitourinary: ?   Penis: Circumcised. Lesions present.   ?   Comments: Hypopigmented slightly crusted round  lesion with centralfollicular pit. No tenderness ?Musculoskeletal:  ?   Cervical back: Full passive range of motion without pain, normal range of motion and neck supple.  ?Lymphadenopathy:  ?   Cervical: No cervical adenopathy.  ?   Right cervical: No superficial, deep or posterior cervical adenopathy. ?   Left cervical: No superficial, deep or posterior cervical adenopathy.  ? ? ?Wt Readings from Last 3 Encounters:  ?03/27/22 137 lb (62.1 kg)  ?02/11/22 137 lb (62.1 kg)  ?01/10/22 132 lb (59.9 kg)  ? ? ?BP 110/80   Pulse 64   Ht 6' (1.829 m)   Wt 137 lb (62.1 kg)   BMI 18.58 kg/m?  ? ?Assessment and Plan: ? ?1. Chronic folliculitis ?Chronic.  Persistent.  Stable.  This is either a chronic follicular versus a small sebaceous cyst area since patient does complain about occasional odor which may represent a drainage from the cyst.  Patient will continue to use his Bactroban.  But there is no with secondary infection from staph that is noted at this time.  Patient has seen his urologist and asked him to bring it to their attention to make sure that this does not need to be biopsy of this as above penile concern.  And if uncertain he has a dermatologist and I have encouraged him to go see Dr. Phillip Heal and have them take a look at it to see if it needs to be further evaluated as well as treated in the different way. ?- mupirocin ointment (BACTROBAN) 2 %; Apply 1  application. topically 2 (two) times daily.  Dispense: 22 g; Refill: 0  ? ? ?

## 2022-04-16 ENCOUNTER — Other Ambulatory Visit: Payer: Self-pay | Admitting: Family Medicine

## 2022-04-16 DIAGNOSIS — L739 Follicular disorder, unspecified: Secondary | ICD-10-CM

## 2022-04-17 ENCOUNTER — Other Ambulatory Visit: Payer: Self-pay

## 2022-04-17 DIAGNOSIS — J449 Chronic obstructive pulmonary disease, unspecified: Secondary | ICD-10-CM | POA: Diagnosis not present

## 2022-04-17 DIAGNOSIS — N489 Disorder of penis, unspecified: Secondary | ICD-10-CM

## 2022-04-17 NOTE — Telephone Encounter (Signed)
Completed course Requested Prescriptions  Pending Prescriptions Disp Refills  . doxycycline (VIBRA-TABS) 100 MG tablet [Pharmacy Med Name: DOXYCYCLINE HYC 100MG  TABS] 30 tablet 1    Sig: TAKE 1 TABLET(100 MG) BY MOUTH DAILY     Off-Protocol Failed - 04/16/2022  3:09 PM      Failed - Medication not assigned to a protocol, review manually.      Passed - Valid encounter within last 12 months    Recent Outpatient Visits          3 weeks ago Chronic folliculitis   [facility] Juline Patch, MD   2 months ago Seborrheic keratoses   [facility] Juline Patch, MD   3 months ago Hyperlipidemia, unspecified hyperlipidemia type   [facility] Juline Patch, MD   5 months ago Stage 4 very severe COPD by GOLD classification [facility])   [facility] Juline Patch, MD   6 months ago Mild intermittent reactive airway disease without complication   [facility] Juline Patch, MD      Future Appointments            In 4 days McGowan, Gordan Payment [place]   In 8 months McGowan, Gordan Payment [facility] [place]

## 2022-04-18 ENCOUNTER — Other Ambulatory Visit: Payer: Self-pay

## 2022-04-18 ENCOUNTER — Emergency Department (HOSPITAL_COMMUNITY): Payer: Medicare HMO

## 2022-04-18 ENCOUNTER — Inpatient Hospital Stay (HOSPITAL_COMMUNITY)
Admission: EM | Admit: 2022-04-18 | Discharge: 2022-04-20 | DRG: 190 | Disposition: A | Discharge status: Home / Self Care | Payer: Medicare HMO | Attending: Internal Medicine | Admitting: Internal Medicine

## 2022-04-18 ENCOUNTER — Encounter (HOSPITAL_COMMUNITY): Payer: Self-pay | Admitting: Internal Medicine

## 2022-04-18 DIAGNOSIS — N400 Enlarged prostate without lower urinary tract symptoms: Secondary | ICD-10-CM | POA: Diagnosis not present

## 2022-04-18 DIAGNOSIS — Z87891 Personal history of nicotine dependence: Secondary | ICD-10-CM | POA: Diagnosis not present

## 2022-04-18 DIAGNOSIS — J439 Emphysema, unspecified: Secondary | ICD-10-CM | POA: Diagnosis not present

## 2022-04-18 DIAGNOSIS — Z743 Need for continuous supervision: Secondary | ICD-10-CM | POA: Diagnosis not present

## 2022-04-18 DIAGNOSIS — J9601 Acute respiratory failure with hypoxia: Secondary | ICD-10-CM | POA: Diagnosis not present

## 2022-04-18 DIAGNOSIS — Z7951 Long term (current) use of inhaled steroids: Secondary | ICD-10-CM | POA: Diagnosis not present

## 2022-04-18 DIAGNOSIS — Z888 Allergy status to other drugs, medicaments and biological substances status: Secondary | ICD-10-CM | POA: Diagnosis not present

## 2022-04-18 DIAGNOSIS — J9621 Acute and chronic respiratory failure with hypoxia: Secondary | ICD-10-CM | POA: Diagnosis not present

## 2022-04-18 DIAGNOSIS — I491 Atrial premature depolarization: Secondary | ICD-10-CM | POA: Diagnosis not present

## 2022-04-18 DIAGNOSIS — D649 Anemia, unspecified: Secondary | ICD-10-CM | POA: Diagnosis not present

## 2022-04-18 DIAGNOSIS — Z7902 Long term (current) use of antithrombotics/antiplatelets: Secondary | ICD-10-CM | POA: Diagnosis not present

## 2022-04-18 DIAGNOSIS — R0602 Shortness of breath: Secondary | ICD-10-CM | POA: Diagnosis not present

## 2022-04-18 DIAGNOSIS — J441 Chronic obstructive pulmonary disease with (acute) exacerbation: Principal | ICD-10-CM | POA: Diagnosis present

## 2022-04-18 DIAGNOSIS — E785 Hyperlipidemia, unspecified: Secondary | ICD-10-CM | POA: Diagnosis present

## 2022-04-18 DIAGNOSIS — Z7952 Long term (current) use of systemic steroids: Secondary | ICD-10-CM

## 2022-04-18 DIAGNOSIS — Z79899 Other long term (current) drug therapy: Secondary | ICD-10-CM

## 2022-04-18 DIAGNOSIS — R000 Tachycardia, unspecified: Secondary | ICD-10-CM | POA: Diagnosis not present

## 2022-04-18 DIAGNOSIS — R0689 Other abnormalities of breathing: Secondary | ICD-10-CM | POA: Diagnosis not present

## 2022-04-18 DIAGNOSIS — L739 Follicular disorder, unspecified: Secondary | ICD-10-CM | POA: Diagnosis present

## 2022-04-18 DIAGNOSIS — R062 Wheezing: Secondary | ICD-10-CM | POA: Diagnosis not present

## 2022-04-18 DIAGNOSIS — I25810 Atherosclerosis of coronary artery bypass graft(s) without angina pectoris: Secondary | ICD-10-CM | POA: Diagnosis present

## 2022-04-18 DIAGNOSIS — Z8673 Personal history of transient ischemic attack (TIA), and cerebral infarction without residual deficits: Secondary | ICD-10-CM

## 2022-04-18 DIAGNOSIS — Z9981 Dependence on supplemental oxygen: Secondary | ICD-10-CM | POA: Diagnosis not present

## 2022-04-18 DIAGNOSIS — R0902 Hypoxemia: Secondary | ICD-10-CM | POA: Diagnosis not present

## 2022-04-18 LAB — CBC WITH DIFFERENTIAL/PLATELET
Abs Immature Granulocytes: 0.01 10*3/uL (ref 0.00–0.07)
Basophils Absolute: 0.1 10*3/uL (ref 0.0–0.1)
Basophils Relative: 1 %
Eosinophils Absolute: 0.4 10*3/uL (ref 0.0–0.5)
Eosinophils Relative: 5 %
HCT: 39.4 % (ref 39.0–52.0)
Hemoglobin: 12.6 g/dL — ABNORMAL LOW (ref 13.0–17.0)
Immature Granulocytes: 0 %
Lymphocytes Relative: 25 %
Lymphs Abs: 1.9 10*3/uL (ref 0.7–4.0)
MCH: 33.2 pg (ref 26.0–34.0)
MCHC: 32 g/dL (ref 30.0–36.0)
MCV: 103.7 fL — ABNORMAL HIGH (ref 80.0–100.0)
Monocytes Absolute: 1 10*3/uL (ref 0.1–1.0)
Monocytes Relative: 12 %
Neutro Abs: 4.5 10*3/uL (ref 1.7–7.7)
Neutrophils Relative %: 57 %
Platelets: 162 10*3/uL (ref 150–400)
RBC: 3.8 MIL/uL — ABNORMAL LOW (ref 4.22–5.81)
RDW: 13 % (ref 11.5–15.5)
WBC: 7.8 10*3/uL (ref 4.0–10.5)
nRBC: 0 % (ref 0.0–0.2)

## 2022-04-18 LAB — COMPREHENSIVE METABOLIC PANEL
ALT: 40 U/L (ref 0–44)
AST: 33 U/L (ref 15–41)
Albumin: 3.5 g/dL (ref 3.5–5.0)
Alkaline Phosphatase: 42 U/L (ref 38–126)
Anion gap: 6 (ref 5–15)
BUN: 11 mg/dL (ref 8–23)
CO2: 29 mmol/L (ref 22–32)
Calcium: 8.7 mg/dL — ABNORMAL LOW (ref 8.9–10.3)
Chloride: 107 mmol/L (ref 98–111)
Creatinine, Ser: 1.12 mg/dL (ref 0.61–1.24)
GFR, Estimated: >60 mL/min (ref >60)
Glucose, Bld: 125 mg/dL — ABNORMAL HIGH (ref 70–99)
Potassium: 3.8 mmol/L (ref 3.5–5.1)
Sodium: 142 mmol/L (ref 135–145)
Total Bilirubin: 0.8 mg/dL (ref 0.3–1.2)
Total Protein: 6.5 g/dL (ref 6.5–8.1)

## 2022-04-18 LAB — GLUCOSE, CAPILLARY: Glucose-Capillary: 152 mg/dL — ABNORMAL HIGH (ref 70–99)

## 2022-04-18 LAB — TROPONIN I (HIGH SENSITIVITY)
Troponin I (High Sensitivity): 13 ng/L (ref <18)
Troponin I (High Sensitivity): 13 ng/L (ref <18)

## 2022-04-18 LAB — BRAIN NATRIURETIC PEPTIDE: B Natriuretic Peptide: 37.3 pg/mL (ref 0.0–100.0)

## 2022-04-18 MED ORDER — BUDESONIDE 0.25 MG/2ML IN SUSP: 0.2500 mg | Freq: Two times a day (BID) | RESPIRATORY_TRACT | Status: DC

## 2022-04-18 MED ORDER — ALBUTEROL SULFATE (2.5 MG/3ML) 0.083% IN NEBU: 10.0000 mg/h | INHALATION_SOLUTION | RESPIRATORY_TRACT | Status: DC

## 2022-04-18 MED ORDER — SERTRALINE HCL 50 MG PO TABS
25.0000 mg | ORAL_TABLET | Freq: Every day | ORAL | Status: DC
  Administered 2022-04-18 – 2022-04-19 (×2): 25 mg via ORAL
  Filled 2022-04-18 (×3): qty 1

## 2022-04-18 MED ORDER — SODIUM CHLORIDE 0.9 % IV SOLN
500.0000 mg | INTRAVENOUS | Status: AC
  Administered 2022-04-18: 500 mg via INTRAVENOUS
  Filled 2022-04-18: qty 5

## 2022-04-18 MED ORDER — ALBUTEROL SULFATE (2.5 MG/3ML) 0.083% IN NEBU
INHALATION_SOLUTION | RESPIRATORY_TRACT | Status: AC
  Administered 2022-04-18: 10 mg/h via RESPIRATORY_TRACT
  Filled 2022-04-18: qty 12

## 2022-04-18 MED ORDER — PREDNISONE 20 MG PO TABS
40.0000 mg | ORAL_TABLET | Freq: Every day | ORAL | Status: DC
  Administered 2022-04-19 – 2022-04-20 (×2): 40 mg via ORAL
  Filled 2022-04-18 (×2): qty 2

## 2022-04-18 MED ORDER — ALBUTEROL SULFATE (2.5 MG/3ML) 0.083% IN NEBU
2.5000 mg | INHALATION_SOLUTION | Freq: Four times a day (QID) | RESPIRATORY_TRACT | Status: DC | PRN
Start: 2022-04-18 — End: 2022-04-18

## 2022-04-18 MED ORDER — FLUTICASONE FUROATE-VILANTEROL 100-25 MCG/ACT IN AEPB
1.0000 | INHALATION_SPRAY | Freq: Every day | RESPIRATORY_TRACT | Status: DC
  Administered 2022-04-19 – 2022-04-20 (×2): 1 via RESPIRATORY_TRACT
  Filled 2022-04-18: qty 28

## 2022-04-18 MED ORDER — AZITHROMYCIN 250 MG PO TABS
500.0000 mg | ORAL_TABLET | Freq: Every day | ORAL | Status: DC
  Administered 2022-04-19 – 2022-04-20 (×2): 500 mg via ORAL
  Filled 2022-04-18 (×2): qty 2

## 2022-04-18 MED ORDER — MONTELUKAST SODIUM 10 MG PO TABS
10.0000 mg | ORAL_TABLET | Freq: Every day | ORAL | Status: DC
  Administered 2022-04-18 – 2022-04-19 (×2): 10 mg via ORAL
  Filled 2022-04-18 (×3): qty 1

## 2022-04-18 MED ORDER — TAMSULOSIN HCL 0.4 MG PO CAPS
0.4000 mg | ORAL_CAPSULE | Freq: Every day | ORAL | Status: DC
  Administered 2022-04-19 – 2022-04-20 (×2): 0.4 mg via ORAL
  Filled 2022-04-18 (×2): qty 1

## 2022-04-18 MED ORDER — ENOXAPARIN SODIUM 40 MG/0.4ML IJ SOSY
40.0000 mg | PREFILLED_SYRINGE | INTRAMUSCULAR | Status: DC
  Administered 2022-04-18 – 2022-04-20 (×3): 40 mg via SUBCUTANEOUS
  Filled 2022-04-18 (×3): qty 0.4

## 2022-04-18 MED ORDER — EPINEPHRINE 0.3 MG/0.3ML IJ SOAJ
0.3000 mg | Freq: Once | INTRAMUSCULAR | Status: AC
  Administered 2022-04-18: 0.3 mg via INTRAMUSCULAR
  Filled 2022-04-18: qty 0.3

## 2022-04-18 MED ORDER — IPRATROPIUM-ALBUTEROL 0.5-2.5 (3) MG/3ML IN SOLN
3.0000 mL | Freq: Four times a day (QID) | RESPIRATORY_TRACT | Status: DC
Start: 2022-04-18 — End: 2022-04-19
  Administered 2022-04-18 – 2022-04-19 (×5): 3 mL via RESPIRATORY_TRACT
  Filled 2022-04-18 (×5): qty 3

## 2022-04-18 MED ORDER — PREDNISONE 20 MG PO TABS: 40.0000 mg | ORAL_TABLET | Freq: Every day | ORAL | Status: DC

## 2022-04-18 MED ORDER — METHYLPREDNISOLONE SODIUM SUCC 125 MG IJ SOLR
125.0000 mg | Freq: Two times a day (BID) | INTRAMUSCULAR | Status: DC
  Administered 2022-04-18: 125 mg via INTRAVENOUS
  Filled 2022-04-18: qty 2

## 2022-04-18 MED ORDER — ATORVASTATIN CALCIUM 80 MG PO TABS
80.0000 mg | ORAL_TABLET | Freq: Every day | ORAL | Status: DC
  Administered 2022-04-19 – 2022-04-20 (×2): 80 mg via ORAL
  Filled 2022-04-18 (×2): qty 1

## 2022-04-18 MED ORDER — CLOPIDOGREL BISULFATE 75 MG PO TABS
75.0000 mg | ORAL_TABLET | Freq: Every day | ORAL | Status: DC
  Administered 2022-04-19 – 2022-04-20 (×2): 75 mg via ORAL
  Filled 2022-04-18 (×2): qty 1

## 2022-04-18 MED ORDER — ONDANSETRON HCL 4 MG/2ML IJ SOLN
4.0000 mg | Freq: Once | INTRAMUSCULAR | Status: AC
  Administered 2022-04-18: 4 mg via INTRAVENOUS
  Filled 2022-04-18: qty 2

## 2022-04-18 MED ORDER — ALBUTEROL SULFATE (2.5 MG/3ML) 0.083% IN NEBU: 3.0000 mL | INHALATION_SOLUTION | Freq: Four times a day (QID) | RESPIRATORY_TRACT | Status: DC | PRN

## 2022-04-18 NOTE — Progress Notes (Signed)
RT called to patient room.  Upon arrival, patient was noted to be in respiratory distress on aerosol mask with sats of 100%.  Per MD, patient was placed on bipap however patient began to throw up phlegm type sputum.  Patient removed from bipap and placed on continuous Albuterol treatment.  MD, and RN, made aware.  MD to order zofran and will attempt bipap again once patient has had time to settle from zofran.  Will continue to monitor.

## 2022-04-18 NOTE — ED Notes (Signed)
ED TO INPATIENT HANDOFF REPORT  ED Nurse Name and Phone #: Holiday Mcmenamin, [place] Name/Age/Gender Nathaniel Garcia 76 y.o. male Room/Bed: 003C/003C  Code Status   Code Status: Full Code  Home/SNF/Other Home Patient oriented to: self, place, time, and situation Is this baseline? Yes   Triage Complete: Triage complete  Chief Complaint COPD exacerbation ([place]) [J44.1]  Triage Note Pt arrived via [facility] from home with c/c of Respiratory Distress. Per EMS pt has been feeling bad last couple day but woke this morning severally shob. Hx of COPD. Pt arrived wheezing. Denies chest pain   RR 40, 80% 2L [place] -> 100% Neb, 160/100, 90HR  10mg  albur. 1mg  atrovent, 125 solu, 2g Mag   Allergies Allergies  Allergen Reactions   Hctz [Hydrochlorothiazide] Rash    Level of Care/Admitting Diagnosis ED Disposition     ED Disposition  Admit   Condition  --   Comment  Hospital Area: [facility] [[id number]]  Level of Care: Progressive [102]  Admit to Progressive based on following criteria: RESPIRATORY PROBLEMS hypoxemic/hypercapnic respiratory failure that is responsive to NIPPV (BiPAP) or High Flow Nasal Cannula (6-80 lpm). Frequent assessment/intervention, no > Q2 hrs < Q4 hrs, to maintain oxygenation and pulmonary hygiene.  May admit patient to [facility] or [facility] if equivalent level of care is available:: Yes  Covid Evaluation: Confirmed COVID Negative  Diagnosis: COPD exacerbation [facility]) [id number]  Admitting Physician: Sid Falcon [id number]  Attending Physician: Sid Falcon [id number]  Estimated length of stay: past midnight tomorrow  Certification:: I certify this patient will need inpatient services for at least 2 midnights          B Medical/Surgery History Past Medical History:  Diagnosis Date   COPD (chronic obstructive pulmonary disease) ([place])    Hematuria    Hyperlipidemia    Stroke [facility])    Past Surgical History:  Procedure Laterality Date    COLONOSCOPY  2011   normal- MD docs     A IV Location/Drains/Wounds Patient Lines/Drains/Airways Status     Active Line/Drains/Airways     Name Placement date Placement time Site Days   Peripheral IV 04/18/22 20 G Left Antecubital 04/18/22  --  Antecubital  less than 1   Peripheral IV 04/18/22 18 G 1.16" Anterior;Proximal;Right Forearm 04/18/22  0832  Forearm  less than 1            Intake/Output Last 24 hours No intake or output data in the 24 hours ending 04/18/22 1346  Labs/Imaging Results for orders placed or performed during the hospital encounter of 04/18/22 (from the past 48 hour(s))  CBC with Differential     Status: Abnormal   Collection Time: 04/18/22  8:17 AM  Result Value Ref Range   WBC 7.8 4.0 - 10.5 K/uL   RBC 3.80 (L) 4.22 - 5.81 MIL/uL   Hemoglobin 12.6 (L) 13.0 - 17.0 g/dL   HCT 39.4 39.0 - 52.0 %   MCV 103.7 (H) 80.0 - 100.0 fL   MCH 33.2 26.0 - 34.0 pg   MCHC 32.0 30.0 - 36.0 g/dL   RDW 13.0 11.5 - 15.5 %   Platelets 162 150 - 400 K/uL   nRBC 0.0 0.0 - 0.2 %   Neutrophils Relative % 57 %   Neutro Abs 4.5 1.7 - 7.7 K/uL   Lymphocytes Relative 25 %   Lymphs Abs 1.9 0.7 - 4.0 K/uL   Monocytes Relative 12 %   Monocytes Absolute 1.0  0.1 - 1.0 K/uL   Eosinophils Relative 5 %   Eosinophils Absolute 0.4 0.0 - 0.5 K/uL   Basophils Relative 1 %   Basophils Absolute 0.1 0.0 - 0.1 K/uL   Immature Granulocytes 0 %   Abs Immature Granulocytes 0.01 0.00 - 0.07 K/uL    Comment: Performed at [facility] [address] [place], [place] [address]  Comprehensive metabolic panel     Status: Abnormal   Collection Time: 04/18/22  8:17 AM  Result Value Ref Range   Sodium 142 135 - 145 mmol/L   Potassium 3.8 3.5 - 5.1 mmol/L   Chloride 107 98 - 111 mmol/L   CO2 29 22 - 32 mmol/L   Glucose, Bld 125 (H) 70 - 99 mg/dL    Comment: Glucose reference range applies only to samples taken after fasting for at least 8 hours.   BUN 11 8 - 23 mg/dL   Creatinine,  Ser 1.12 0.61 - 1.24 mg/dL   Calcium 8.7 (L) 8.9 - 10.3 mg/dL   Total Protein 6.5 6.5 - 8.1 g/dL   Albumin 3.5 3.5 - 5.0 g/dL   AST 33 15 - 41 U/L   ALT 40 0 - 44 U/L   Alkaline Phosphatase 42 38 - 126 U/L   Total Bilirubin 0.8 0.3 - 1.2 mg/dL   GFR, Estimated >60 >60 mL/min    Comment: (NOTE) Calculated using the CKD-EPI Creatinine Equation (2021)    Anion gap 6 5 - 15    Comment: Performed at [facility] [address], [place], [place] [address]  Troponin I (High Sensitivity)     Status: None   Collection Time: 04/18/22  8:17 AM  Result Value Ref Range   Troponin I (High Sensitivity) 13 <18 ng/L    Comment: (NOTE) Elevated high sensitivity troponin I (hsTnI) values and significant  changes across serial measurements may suggest ACS but many other  chronic and acute conditions are known to elevate hsTnI results.  Refer to the "Links" section for chest pain algorithms and additional  guidance. Performed at [facility], [place] [address] [place], [place] [address]   Brain natriuretic peptide     Status: None   Collection Time: 04/18/22  8:17 AM  Result Value Ref Range   B Natriuretic Peptide 37.3 0.0 - 100.0 pg/mL    Comment: Performed at [facility] [address], [place], [place] [address]  Culture, blood (routine x 2)     Status: None (Preliminary result)   Collection Time: 04/18/22  8:23 AM   Specimen: BLOOD  Result Value Ref Range   Specimen Description BLOOD SITE NOT SPECIFIED    Special Requests      BOTTLES DRAWN AEROBIC AND ANAEROBIC Blood Culture adequate volume   Culture      NO GROWTH <12 HOURS Performed at [facility], [place] [address], [place], [place] [address]    Report Status PENDING   Culture, blood (routine x 2)     Status: None (Preliminary result)   Collection Time: 04/18/22  8:30 AM   Specimen: BLOOD  Result Value Ref Range   Specimen Description BLOOD SITE NOT SPECIFIED    Special Requests      BOTTLES DRAWN AEROBIC  AND ANAEROBIC Blood Culture adequate volume   Culture      NO GROWTH <12 HOURS Performed at [facility], [place] [address] [place],  [address]    Report Status  PENDING   Troponin I (High Sensitivity)     Status: None   Collection Time: 04/18/22 11:40 AM  Result Value Ref Range   Troponin I (High Sensitivity) 13 <18 ng/L    Comment: (NOTE) Elevated high sensitivity troponin I (hsTnI) values and significant  changes across serial measurements may suggest ACS but many other  chronic and acute conditions are known to elevate hsTnI results.  Refer to the "Links" section for chest pain algorithms and additional  guidance. Performed at [facility], [place] [address] [place], [place] [address]    DG Chest Port 1 View  Result Date: 04/18/2022 CLINICAL DATA:  Shortness of breath, wheezing. EXAM: PORTABLE CHEST 1 VIEW COMPARISON:  10/29/2021 and CT chest 08/07/2021. FINDINGS: Trachea is midline. Heart size normal. Lungs are severely emphysematous. No airspace consolidation or pleural fluid. IMPRESSION: No acute findings. Electronically Signed   By: Lorin Picket M.D.   On: 04/18/2022 08:58    Pending Labs Unresulted Labs (From admission, onward)     Start     Ordered   04/18/22 1151  HIV Antibody (routine testing w rflx)  (HIV Antibody (Routine testing w reflex) panel)  Once,   R        04/18/22 1209            Vitals/Pain Today's Vitals   04/18/22 1115 04/18/22 1130 04/18/22 1145 04/18/22 1202  BP: 100/81 113/81    Pulse: 88 71 72 72  Resp: (!) 29 (!) 27 (!) 22 20  Temp:      TempSrc:      SpO2: 100% 100% 100%   Weight:      Height:      PainSc:        Isolation Precautions No active isolations  Medications Medications  ipratropium-albuterol (DUONEB) 0.5-2.5 (3) MG/3ML nebulizer solution 3 mL (has no administration in time range)  enoxaparin (LOVENOX) injection 40 mg (40 mg Subcutaneous Given 04/18/22 1228)  azithromycin (ZITHROMAX) 500 mg in sodium  chloride 0.9 % 250 mL IVPB (500 mg Intravenous New Bag/Given 04/18/22 1227)    Followed by  azithromycin (ZITHROMAX) tablet 500 mg (has no administration in time range)  fluticasone furoate-vilanterol (BREO ELLIPTA) 100-25 MCG/ACT 1 puff (has no administration in time range)  albuterol (PROVENTIL) (2.5 MG/3ML) 0.083% nebulizer solution 2.5 mg (has no administration in time range)  predniSONE (DELTASONE) tablet 40 mg (has no administration in time range)  EPINEPHrine (EPI-PEN) injection 0.3 mg (0.3 mg Intramuscular Given 04/18/22 0825)  ondansetron (ZOFRAN) injection 4 mg (4 mg Intravenous Given 04/18/22 0901)    Mobility walks Low fall risk   Focused Assessments   , Pulmonary Assessment Handoff:  Lung sounds: Bilateral Breath Sounds: Diminished, Expiratory wheezes L Breath Sounds: Clear R Breath Sounds: Clear O2 Device: High Flow Nasal Cannula (salter) O2 Flow Rate (L/min): 4 L/min    R Recommendations: See Admitting Provider Note  Report given to:   Additional Notes:

## 2022-04-18 NOTE — H&P (Signed)
Date: 04/18/2022               Patient Name:  Nathaniel Garcia MRN: [id number]  DOB: 10/16/1946 Age / Sex: 76 y.o., male   PCP: Juline Patch, MD         Medical Service: Internal Medicine Teaching Service         Attending Physician: Dr. Sid Falcon, MD    First Contact: Dr. Jeanice Lim Pager: [phone]  Second Contact: Dr. Alfonse Spruce Pager: [phone]       After Hours (After 5p/  First Contact Pager: [phone]  weekends / holidays): Second Contact Pager: [phone]   Chief Complaint: SOB  History of Present Illness: Mr. Nathaniel Estepa. Garcia is a 83 yoM with COPD stage III-IV on chronic prednisone therapy, prior CVA, HLD, chronic folliculitis, and BPH presenting with shortness of breath.  He states that this started about 2 days ago when he suddenly started feeling more short of breath than his baseline.  He is on 3 L supplemental oxygen at home.  States that he typically feels dyspnea on exertion, especially when he is going up the stairs, walking to his car or showering.  He denies any cough or mucus production.  States that he has been hospitalized several times for COPD exacerbations.  He is on prednisone chronically and states that his pulmonologist just increased this dose from 5 to 10 mg yesterday.  He did not start the new dose yet.  States he did his breathing treatments and has been increasing his albuterol use to approximately 6 or more times a day without improvement of his dyspnea.  Denies any recent illnesses or sick contacts.  Denies any headaches, changes in vision, chest pain, palpitations, nausea, dysuria, difficulty with bowel movements or weakness.  He did say he had 1 episode of emesis in the emergency department.  States he is able to complete all his ADLs himself however his sister at the bedside states that he has been having difficulty with this.  He does endorse decreased levels of energy when he takes his medications.  States this has been going on for several months.   His sister is concerned for his safety at home and thinks that he needs more help.  Meds:  Albuterol 90 mcg 2 puffs q6h prn Atorvastatin 80 mg  Budesonide nebulizer  Bupropion 150 mg  Clopidogrel 75 mg  Ipratropium-albuterol nebulizer solution qid  Montelukast 10 mg  Prednisone 5 mg  Tamsulosin 0.4 mg     Allergies: Allergies as of 04/18/2022 - Review Complete 04/18/2022  Allergen Reaction Noted   Hctz [hydrochlorothiazide] Rash 03/15/2021   Past Medical History:  Diagnosis Date   COPD (chronic obstructive pulmonary disease) (HCC)    Hematuria    Hyperlipidemia    Stroke ([place])     Family History:  Family History  Problem Relation Age of Onset   Prostate cancer Neg Hx      Social History: 59 pack year smoking history; smoked 1 cigarette daily since the age of 73. Reports quitting 6 months ago.  Drinks 12 ounce Heineken most nights.  Denies any illicit drug use.  Currently lives alone and reports that he is able to complete all of his ADLs independently however the sister at the bedside is reporting that he needs assistance due to decreasing levels of energy.  Denies any use of assistive walking devices.  Review of Systems: A complete ROS was negative except as per HPI.   Physical  Exam: Blood pressure 113/81, pulse 72, temperature 97.6 F (36.4 C), temperature source Oral, resp. rate 20, height 6' (1.829 m), weight 63.5 kg, SpO2 100 %. Physical Exam General: alert, elderly appearing gentleman on BiPAP HEENT: Normocephalic, atraumatic, EOM intact, conjunctiva normal CV: Regular rate and rhythm, no murmurs rubs or gallops Pulm: Significant expiratory wheezing, intermittently tachypneic on BiPAP Abdomen: Soft, nondistended, bowel sounds present, no tenderness to palpation MSK: No lower extremity edema Skin: Warm and dry Neuro: Alert and oriented x3   Labs- CBC and BMP wnl Troponin negative  BNP wnl  EKG: personally reviewed my interpretation is sinus rhythm,  significant artifact we will repeat  CXR: personally reviewed my interpretation is no acute cardiopulmonary process  Assessment & Plan by Problem: Principal Problem:   Acute respiratory failure with hypoxia (HCC) Active Problems:   COPD exacerbation ([place])  Mr. Nathaniel [place]. Garcia is a 1 yoM with COPD stage III-IV on chronic prednisone therapy and 3L supplemental oxygen at baseline, prior CVA, HLD, chronic folliculitis, and BPH presenting with shortness of breath for the past 2 days. Admitted with acute hypoxic respiratory failure 2/2 COPD exacerbation.   Acute hypoxic respiratory failure COPD exacerbation Stage III-IV COPD  Patient presented with respiratory distress, accessory muscle use and audible wheezes.  EMS gave 1 dose of Solu-Medrol 125 mg, albuterol and magnesium. In the ED he required continuous albuterol and one dose of epinephrine. He was started on BiPAP and improved. Now transitioned to 4L HFNC saturating well.  Of note he had 1 episode of emesis while on BiPAP and it was removed for a few minutes before being replaced.  Unclear if chest radiograph was before or after this episode but no evidence of aspiration pneumonia/pneumonitis.   Patient did see outpatient pulmonology yesterday, plan was to continue duonebs TID and budesonide 0.5 nb q12 hrs, spacer for albuterol inhaler was provided and prednisone was increased to 10 mg daily. He is on 3 L supplemental oxygen at baseline. Offered pulmonary rehab, patient was deciding.    Patient with 1 out of 3 cardinal symptoms (dyspnea).  Started on azithromycin and Solu-Medrol, can transition to prednisone tomorrow.  Initially was on continuous BiPAP now weaned to high flow nasal cannula.  Home medications include prednisone 10 mg, singular 10 mg, DuoNebs 4 times daily, Pulmicort nebulizer and albuterol as needed. Unfortunately patient has severe COPD with multiple hospitalizations 2/2 COPD. I encouraged him to pursue pulmonary rehab. Will  treat with 5 days of azithromycin and prednisone and encourage pulmonary hygiene.   -Continue azithromycin and steroids for total of 5 days -DuoNebs every 6 hours -PRN albuterol  -Breo Ellipta 1 puff daily -Incentive spirometer -PT/OT consult -O2 saturation goal 88 to 92%  Prior CVA No residual deficits. -Continue Plavix 75 mg and atorvastatin 80 mg daily  BPH -Resume tamsulosin 0.4 mg if remains off BiPAP  Diet: N.p.o., if remains off BiPAP can start regular diet VTE prophylaxis: Lovenox Full code  Dispo: Admit patient to Inpatient with expected length of stay greater than 2 midnights.  Signed: Mike Craze, DO 04/18/2022, 12:20 PM  After 5pm on weekdays and 1pm on weekends: On Call pager: [phone]

## 2022-04-18 NOTE — ED Provider Notes (Signed)
[facility] EMERGENCY DEPARTMENT Provider Note   CSN: [id number] Arrival date & time: 04/18/22  0809     History  Chief Complaint  Patient presents with   Respiratory Distress    Nathaniel Garcia is a 76 y.o. male.  Patient presents to the ER chief complaint shortness of breath.  Symptoms ongoing for the past 3 days or so but worse this morning.  Unable to get out 1 or 2 words due to severe shortness of breath.  Brought in by ambulance.  No reports of fevers no reports of chest pain no vomiting or diarrhea reported.      Home Medications Prior to Admission medications   Medication Sig Start Date End Date Taking? Authorizing Provider  albuterol (VENTOLIN HFA) 108 (90 Base) MCG/ACT inhaler Inhale 2 puffs into the lungs every 6 (six) hours as needed for wheezing or shortness of breath. 10/09/21   Juline Patch, MD  atorvastatin (LIPITOR) 80 MG tablet Take 1 tablet (80 mg total) by mouth daily. 01/10/22   Juline Patch, MD  budesonide (PULMICORT) 0.25 MG/2ML nebulizer solution Use 1 vial (0.25 mg total) by nebulization 2 (two) times daily. 11/14/21   Juline Patch, MD  clopidogrel (PLAVIX) 75 MG tablet TAKE 1 TABLET(75 MG) BY MOUTH DAILY 11/01/21   Gifford Shave, MD  ipratropium-albuterol (DUONEB) 0.5-2.5 (3) MG/3ML SOLN Use 1 vial by nebulization 4 (four) times daily. 11/01/21   Gifford Shave, MD  montelukast (SINGULAIR) 10 MG tablet Take 1 tablet (10 mg total) by mouth at bedtime. 01/10/22   Juline Patch, MD  mupirocin ointment (BACTROBAN) 2 % Apply 1 application. topically 2 (two) times daily. 03/27/22   Juline Patch, MD  sertraline (ZOLOFT) 25 MG tablet Take 1 tablet (25 mg total) by mouth at bedtime. 11/01/21   Gifford Shave, MD  tamsulosin (FLOMAX) 0.4 MG CAPS capsule TAKE 1 CAPSULE(0.4 MG) BY MOUTH DAILY 01/23/22   Ernestine Conrad, Larene Beach A, PA-C      Allergies    Hctz [hydrochlorothiazide]    Review of Systems   Review of Systems  Unable  to perform ROS: Acuity of condition   Physical Exam Updated Vital Signs BP 125/60   Pulse 73   Temp 97.6 F (36.4 C) (Oral)   Resp (!) 23   Ht 6' (1.829 m)   Wt 63.5 kg   SpO2 100%   BMI 18.99 kg/m  Physical Exam Constitutional:      Appearance: He is well-developed.  HENT:     Head: Normocephalic.     Nose: Nose normal.  Eyes:     Extraocular Movements: Extraocular movements intact.  Cardiovascular:     Rate and Rhythm: Tachycardia present.  Pulmonary:     Effort: Respiratory distress present.     Breath sounds: Wheezing and rales present.     Comments: Accessory muscle use, clipped speech Skin:    Coloration: Skin is not jaundiced.  Neurological:     Mental Status: He is alert. Mental status is at baseline.    ED Results / Procedures / Treatments   Labs (all labs ordered are listed, but only abnormal results are displayed) Labs Reviewed  CBC WITH DIFFERENTIAL/PLATELET - Abnormal; Notable for the following components:      Result Value   RBC 3.80 (*)    Hemoglobin 12.6 (*)    MCV 103.7 (*)    All other components within normal limits  COMPREHENSIVE METABOLIC PANEL - Abnormal; Notable for the following components:  Glucose, Bld 125 (*)    Calcium 8.7 (*)    All other components within normal limits  CULTURE, BLOOD (ROUTINE X 2)  CULTURE, BLOOD (ROUTINE X 2)  BRAIN NATRIURETIC PEPTIDE  TROPONIN I (HIGH SENSITIVITY)  TROPONIN I (HIGH SENSITIVITY)    EKG EKG Interpretation  Date/Time:  Friday April 18 2022 08:29:13 EDT Ventricular Rate:  101 PR Interval:  125 QRS Duration: 94 QT Interval:  337 QTC Calculation: 437 R Axis:   87 Text Interpretation: Sinus tachycardia Atrial premature complexes Borderline right axis deviation Anteroseptal infarct, old Repol abnrm suggests ischemia, inferior leads Minimal ST elevation, lateral leads Artifact in lead(s) I II III aVR aVL aVF V1 V2 V3 V4 V5 V6 and baseline wander in lead(s) V4 Confirmed by Thamas Jaegers ([id number]) on  04/18/2022 8:33:43 AM  Radiology DG Chest Port 1 View  Result Date: 04/18/2022 CLINICAL DATA:  Shortness of breath, wheezing. EXAM: PORTABLE CHEST 1 VIEW COMPARISON:  10/29/2021 and CT chest 08/07/2021. FINDINGS: Trachea is midline. Heart size normal. Lungs are severely emphysematous. No airspace consolidation or pleural fluid. IMPRESSION: No acute findings. Electronically Signed   By: Lorin Picket M.D.   On: 04/18/2022 08:58    Procedures .Critical Care Performed by: Luna Fuse, MD Authorized by: Luna Fuse, MD   Critical care provider statement:    Critical care time (minutes):  40   Critical care time was exclusive of:  Separately billable procedures and treating other patients and teaching time   Critical care was necessary to treat or prevent imminent or life-threatening deterioration of the following conditions:  Respiratory failure    Medications Ordered in ED Medications  albuterol (PROVENTIL) (2.5 MG/3ML) 0.083% nebulizer solution (10 mg/hr Nebulization New Bag/Given 04/18/22 0847)  EPINEPHrine (EPI-PEN) injection 0.3 mg (0.3 mg Intramuscular Given 04/18/22 0825)  ondansetron (ZOFRAN) injection 4 mg (4 mg Intravenous Given 04/18/22 0901)    ED Course/ Medical Decision Making/ A&P                           Medical Decision Making Amount and/or Complexity of Data Reviewed Labs: ordered. Radiology: ordered.  Risk Prescription drug management. Decision regarding hospitalization.   Cardiac monitor shows sinus rhythm.  Review of records shows primary care office visit yesterday with their pulmonologist.  History obtained from sister at bedside.  I was called immediately to the patient's bedside due to his respiratory extremis.  Patient has significant tachypnea, accessory muscle use and audible wheezes from a distance.  EMS and given 125 mg of Solu-Medrol, 10 mg of albuterol and 2 mg of magnesium.  I ordered continuous albuterol treatment and gave the patient a shot  of epi 0.3 mg IM.  Patient had minimal improvement.  Started on BiPAP.  He vomited small quantity while on BiPAP and it was removed for a few minutes before being replaced due to worsening respiratory distress.  Several hours on BiPAP, appeared to have significant improved his condition.  Will be admitted to the hospitalist.  Hospitalist have called back stating this patient should belong to internal medicine.  I have called internal medicine to admit.        Final Clinical Impression(s) / ED Diagnoses Final diagnoses:  COPD exacerbation ([place])  Acute respiratory failure with hypoxia [facility])    Rx / DC Orders ED Discharge Orders     None         Luna Fuse, MD 04/18/22 1116

## 2022-04-18 NOTE — Progress Notes (Signed)
Patient arrived onto 3E, dyspneic. RT called to place patient on BIPAP. RN educated patient on nothing by mouth (NPO) while on BIPAP. Rosalind, sister at bedside. RN made sister aware of nothing by mouth per protocol.

## 2022-04-18 NOTE — ED Triage Notes (Signed)
Pt arrived via [facility] from home with c/c of Respiratory Distress. Per EMS pt has been feeling bad last couple day but woke this morning severally shob. Hx of COPD. Pt arrived wheezing. Denies chest pain   RR 40, 80% 2L  -> 100% Neb, 160/100, 90HR  10mg  albur. 1mg  atrovent, 125 solu, 2g Mag

## 2022-04-19 DIAGNOSIS — I25810 Atherosclerosis of coronary artery bypass graft(s) without angina pectoris: Secondary | ICD-10-CM

## 2022-04-19 DIAGNOSIS — J441 Chronic obstructive pulmonary disease with (acute) exacerbation: Principal | ICD-10-CM

## 2022-04-19 LAB — CBC WITH DIFFERENTIAL/PLATELET
Abs Immature Granulocytes: 0.02 10*3/uL (ref 0.00–0.07)
Basophils Absolute: 0 10*3/uL (ref 0.0–0.1)
Basophils Relative: 0 %
Eosinophils Absolute: 0 10*3/uL (ref 0.0–0.5)
Eosinophils Relative: 0 %
HCT: 32.7 % — ABNORMAL LOW (ref 39.0–52.0)
Hemoglobin: 10.8 g/dL — ABNORMAL LOW (ref 13.0–17.0)
Immature Granulocytes: 0 %
Lymphocytes Relative: 9 %
Lymphs Abs: 0.8 10*3/uL (ref 0.7–4.0)
MCH: 33.1 pg (ref 26.0–34.0)
MCHC: 33 g/dL (ref 30.0–36.0)
MCV: 100.3 fL — ABNORMAL HIGH (ref 80.0–100.0)
Monocytes Absolute: 0.8 10*3/uL (ref 0.1–1.0)
Monocytes Relative: 10 %
Neutro Abs: 6.5 10*3/uL (ref 1.7–7.7)
Neutrophils Relative %: 81 %
Platelets: 162 10*3/uL (ref 150–400)
RBC: 3.26 MIL/uL — ABNORMAL LOW (ref 4.22–5.81)
RDW: 13 % (ref 11.5–15.5)
WBC: 8.2 10*3/uL (ref 4.0–10.5)
nRBC: 0 % (ref 0.0–0.2)

## 2022-04-19 LAB — BASIC METABOLIC PANEL
Anion gap: 5 (ref 5–15)
BUN: 19 mg/dL (ref 8–23)
CO2: 28 mmol/L (ref 22–32)
Calcium: 8.8 mg/dL — ABNORMAL LOW (ref 8.9–10.3)
Chloride: 107 mmol/L (ref 98–111)
Creatinine, Ser: 1.12 mg/dL (ref 0.61–1.24)
GFR, Estimated: >60 mL/min (ref >60)
Glucose, Bld: 117 mg/dL — ABNORMAL HIGH (ref 70–99)
Potassium: 4.4 mmol/L (ref 3.5–5.1)
Sodium: 140 mmol/L (ref 135–145)

## 2022-04-19 LAB — GLUCOSE, CAPILLARY
Glucose-Capillary: 117 mg/dL — ABNORMAL HIGH (ref 70–99)
Glucose-Capillary: 115 mg/dL — ABNORMAL HIGH (ref 70–99)
Glucose-Capillary: 100 mg/dL — ABNORMAL HIGH (ref 70–99)

## 2022-04-19 LAB — HIV ANTIBODY (ROUTINE TESTING W REFLEX): HIV Screen 4th Generation wRfx: NONREACTIVE

## 2022-04-19 MED ORDER — IPRATROPIUM-ALBUTEROL 0.5-2.5 (3) MG/3ML IN SOLN
3.0000 mL | Freq: Three times a day (TID) | RESPIRATORY_TRACT | Status: DC
Start: 2022-04-20 — End: 2022-04-20
  Administered 2022-04-20 (×2): 3 mL via RESPIRATORY_TRACT
  Filled 2022-04-19 (×2): qty 3

## 2022-04-19 NOTE — Evaluation (Signed)
Physical Therapy Evaluation Patient Details Name: Nathaniel Garcia MRN: [id number] DOB: Nov 28, 1945 Today's Date: 04/19/2022  History of Present Illness  76 yoM with COPD stage III-IV on chronic prednisone therapy, prior CVA, HLD, chronic folliculitis, and BPH presenting with shortness of breath.  Clinical Impression  Patient presents with mobility close to functional baseline.  Does have increased RR with ambulation and stairs to 35, but on 7L O2 at rest with SpO2 100% lowered to 5L but ambulated on 6L.  Patient able to demonstrate ambulation and stairs without LOB, but does describe difficulty at times with managing at home when breathing is worse.  Brother in the room and voices concern for him living alone.  Discussed follow up with management at apartments where he lives to get one level unit.  Educated in energy conservation with handout given.  Recommend 3:1 for home as has no ground level bathroom.  Patient without further skilled PT needs.  Will sign off.      Recommendations for follow up therapy are one component of a multi-disciplinary discharge planning process, led by the attending physician.  Recommendations may be updated based on patient status, additional functional criteria and insurance authorization.  Follow Up Recommendations No PT follow up    Assistance Recommended at Discharge Intermittent Supervision/Assistance  Patient can return home with the following  Assistance with cooking/housework    Equipment Recommendations BSC/3in1  Recommendations for Other Services       Functional Status Assessment Patient has not had a recent decline in their functional status     Precautions / Restrictions Precautions Precaution Comments: O2 dependent      Mobility  Bed Mobility Overal bed mobility: Independent                  Transfers Overall transfer level: Modified independent                      Ambulation/Gait Ambulation/Gait assistance:  Independent Gait Distance (Feet): 100 Feet (&130') Assistive device: None Gait Pattern/deviations: Step-through pattern, WFL(Within Functional Limits)       General Gait Details: no device, mild SOB maintained on 6L O2 with ambulation with VSS throughout  Stairs Stairs: Yes Stairs assistance: Supervision Stair Management: One rail Left, Alternating pattern, Forwards Number of Stairs: 10 General stair comments: VSS RR up to 35  Wheelchair Mobility    Modified Rankin (Stroke Patients Only)       Balance Overall balance assessment: Mild deficits observed, not formally tested                                           Pertinent Vitals/Pain Pain Assessment Pain Assessment: No/denies pain    Home Living Family/patient expects to be discharged to:: Private residence Living Arrangements: Alone Available Help at Discharge: Available PRN/intermittently Type of Home: Apartment Home Access: Level entry     Alternate Level Stairs-Number of Steps: flight Home Layout: Two level;Bed/bath upstairs Home Equipment: Rolling Walker (2 wheels);Other (comment) (oxygen, nebulizer) Additional Comments: 3 L O2 baseline    Prior Function Prior Level of Function : Independent/Modified Independent;Driving             Mobility Comments: no use of AD, denies falls ADLs Comments: reports independent with ADLs (stands for showers, does not wear O2 in shower), IADLs in the home and driving/grocery shopping. Pt reports having a portable tank  but does not use when out in store, endorses SOB/fatigue when carrying in groceries and requires rest breaks with similar straining tasks     Hand Dominance        Extremity/Trunk Assessment   Upper Extremity Assessment Upper Extremity Assessment: Defer to OT evaluation    Lower Extremity Assessment Lower Extremity Assessment: Overall WFL for tasks assessed       Communication   Communication: No difficulties  Cognition  Arousal/Alertness: Awake/alert Behavior During Therapy: Flat affect Overall Cognitive Status: Within Functional Limits for tasks assessed                                          General Comments General comments (skin integrity, edema, etc.): Educated on energy conservation; issued and reviewed handout.  Also discussed working with apartment management to get ground floor apartment.  Also to use urinal during the day as pt reports no first floor bathroom in his apartment.    Exercises     Assessment/Plan    PT Assessment Patient does not need any further PT services  PT Problem List         PT Treatment Interventions      PT Goals (Current goals can be found in the Care Plan section)  Acute Rehab PT Goals PT Goal Formulation: All assessment and education complete, DC therapy    Frequency       Co-evaluation               AM-PAC PT "6 Clicks" Mobility  Outcome Measure Help needed turning from your back to your side while in a flat bed without using bedrails?: None Help needed moving from lying on your back to sitting on the side of a flat bed without using bedrails?: None Help needed moving to and from a bed to a chair (including a wheelchair)?: None Help needed standing up from a chair using your arms (e.g., wheelchair or bedside chair)?: None Help needed to walk in hospital room?: None Help needed climbing 3-5 steps with a railing? : A Little 6 Click Score: 23    End of Session Equipment Utilized During Treatment: Gait belt;Oxygen Activity Tolerance: Patient tolerated treatment well Patient left: in bed;with call bell/phone within reach;with family/visitor present   PT Visit Diagnosis: Muscle weakness (generalized) (M62.81)    Time: [id number] PT Time Calculation (min) (ACUTE ONLY): 30 min   Charges:   PT Evaluation $PT Eval Low Complexity: 1 Low PT Treatments $Self Care/Home Management: 8-22        [name], PT Acute  Rehabilitation Services Pager:[phone] Office:[phone] 04/19/2022   [name] 04/19/2022, 4:35 PM

## 2022-04-19 NOTE — Evaluation (Signed)
Occupational Therapy Evaluation Patient Details Name: Nathaniel Garcia MRN: [id number] DOB: Dec 07, 1945 Today's Date: 04/19/2022   History of Present Illness 45 yoM with COPD stage III-IV on chronic prednisone therapy, prior CVA, HLD, chronic folliculitis, and BPH presenting with shortness of breath.   Clinical Impression   Patient admitted for the diagnosis above.  PTA he lives alone, his sister can check on him if needed, but essentially he is at his baseline for in room mobility/toileting and ADL completion.  He is currently back at 3L of O2, and has no acute OT needs.  Recommend home when medically cleared, no post acute OT anticipated.       Recommendations for follow up therapy are one component of a multi-disciplinary discharge planning process, led by the attending physician.  Recommendations may be updated based on patient status, additional functional criteria and insurance authorization.   Follow Up Recommendations  No OT follow up    Assistance Recommended at Discharge PRN  Patient can return home with the following      Functional Status Assessment  Patient has not had a recent decline in their functional status  Equipment Recommendations  None recommended by OT    Recommendations for Other Services       Precautions / Restrictions Precautions Precautions: Other (comment) Precaution Comments: O2 need Restrictions Weight Bearing Restrictions: No      Mobility Bed Mobility Overal bed mobility: Independent               Patient Response: Cooperative  Transfers Overall transfer level: Modified independent                        Balance Overall balance assessment: Mild deficits observed, not formally tested                                         ADL either performed or assessed with clinical judgement   ADL Overall ADL's : At baseline                                             Vision Patient  Visual Report: No change from baseline       Perception Perception Perception: Not tested   Praxis Praxis Praxis: Not tested    Pertinent Vitals/Pain Pain Assessment Pain Assessment: No/denies pain     Hand Dominance Right   Extremity/Trunk Assessment Upper Extremity Assessment Upper Extremity Assessment: Overall WFL for tasks assessed   Lower Extremity Assessment Lower Extremity Assessment: Overall WFL for tasks assessed   Cervical / Trunk Assessment Cervical / Trunk Assessment: Normal   Communication Communication Communication: No difficulties   Cognition Arousal/Alertness: Awake/alert Behavior During Therapy: Flat affect Overall Cognitive Status: Within Functional Limits for tasks assessed                                       General Comments   VSS on 3L    Exercises     Shoulder Instructions      Home Living Family/patient expects to be discharged to:: Private residence Living Arrangements: Alone Available Help at Discharge: Available PRN/intermittently Type of Home: Other(Comment) (Condo) Home Access: Level entry  Home Layout: Two level;Bed/bath upstairs Alternate Level Stairs-Number of Steps: flight Alternate Level Stairs-Rails: Right Bathroom Shower/Tub: Teacher, early years/pre: Standard Bathroom Accessibility: Yes How Accessible: Accessible via walker Home Equipment: None          Prior Functioning/Environment Prior Level of Function : Independent/Modified Independent;Driving             Mobility Comments: no use of AD, denies falls ADLs Comments: reports independent with ADLs (stands for showers, does not wear O2 in shower), IADLs in the home and driving/grocery shopping. Pt reports having a portable tank but does not use when out in store, endorses SOB/fatigue when carrying in groceries and requires rest breaks with similar straining tasks        OT Problem List: Decreased activity tolerance       OT Treatment/Interventions:      OT Goals(Current goals can be found in the care plan section) Acute Rehab OT Goals Patient Stated Goal: Return home OT Goal Formulation: With patient Time For Goal Achievement: 04/22/22 Potential to Achieve Goals: Good  OT Frequency:      Co-evaluation              AM-PAC OT "6 Clicks" Daily Activity     Outcome Measure Help from another person eating meals?: None Help from another person taking care of personal grooming?: None Help from another person toileting, which includes using toliet, bedpan, or urinal?: None Help from another person bathing (including washing, rinsing, drying)?: None Help from another person to put on and taking off regular upper body clothing?: None Help from another person to put on and taking off regular lower body clothing?: None 6 Click Score: 24   End of Session Equipment Utilized During Treatment: Oxygen Nurse Communication: Mobility status  Activity Tolerance: Patient tolerated treatment well Patient left: in bed;with call bell/phone within reach  OT Visit Diagnosis: Unsteadiness on feet (R26.81)                Time: 0955-1010 OT Time Calculation (min): 15 min Charges:  OT General Charges $OT Visit: 1 Visit OT Evaluation $OT Eval Moderate Complexity: 1 Mod  04/19/2022  RP, OTR/L  Acute Rehabilitation Services  Office:  [phone]   Metta Clines 04/19/2022, 10:14 AM

## 2022-04-19 NOTE — Progress Notes (Signed)
IMTS Daily Note  Subjective: Patient is now off Bipap. He feels well this morning, still has some coughing and chest congestion, but no chest pain. His breathing feels better compared to yesterday.  Objective:  Vital signs in last 24 hours: Vitals:   04/18/22 2007 04/19/22 0024 04/19/22 0414 04/19/22 0736  BP:  92/80 (!) 112/45 119/66  Pulse:  70 83 78  Resp:  20 19 20   Temp:  98.2 F (36.8 C) 98.2 F (36.8 C) 98 F (36.7 C)  TempSrc:  Oral Oral Oral  SpO2: 100% 100% 100% 98%  Weight:   60.5 kg   Height:       Exam: He is well appearing, and in no acute distress. Regular rate and rhythm with no murmurs. He has normal work of breathing on 3Lnc without use of accessory muscles. Good air movement, but bilateral expiratory wheezes most prominent in bases. Extremities are warm without edema. Mentating well, answers questions appropriately.   Assessment/Plan:  Principal Problem:   Acute respiratory failure with hypoxia (HCC) Active Problems:   COPD exacerbation ([place])  76yo man with severe COPD on chronic prednisone therapy, chronic hypoxic respiratory failure on 3Lnc at baseline, and prior CVA who presented with acute, progressive dyspnea, admitted with a COPD exacerbation in the setting of progression of underlying COPD.   1.) Acute on chronic COPD: Now back on home 3Lnc, but still wheezy, and hasn't ambulated yet. - Continue Prednisone 40mg  daily  - Continue Azithromycin 500mg  daily - Continue Breo Ellipta (ICS-LABA) - Continue standing Duonebs 4x daily - Continue Albuterol nebs q6 hours prn  - Encourage outpatient pulmonary rehab at discharge  - PT/OT ordered. His main barrier to discharge will be ambulation and improved dyspnea with exertion.  Chronic Stable Issues 2.) Prior CVA - Continue home Plavix 75mg  daily & Atorvastatin 80mg  daily  3.) BPH\ - Continue Tamsulosin 0.4mg  daily   Core Measures - DVT ppx: Lovenox 40mg  q24 hours - Diet: regular - Code Status:  Full  Dispo: Anticipated discharge in approximately 1-2 days, pending PT eval & improvement of dyspnea on exertion  Lottie Mussel, MD 04/19/2022, 7:48 AM

## 2022-04-20 LAB — CBC
HCT: 32.3 % — ABNORMAL LOW (ref 39.0–52.0)
Hemoglobin: 10.7 g/dL — ABNORMAL LOW (ref 13.0–17.0)
MCH: 33.4 pg (ref 26.0–34.0)
MCHC: 33.1 g/dL (ref 30.0–36.0)
MCV: 100.9 fL — ABNORMAL HIGH (ref 80.0–100.0)
Platelets: 150 10*3/uL (ref 150–400)
RBC: 3.2 MIL/uL — ABNORMAL LOW (ref 4.22–5.81)
RDW: 12.9 % (ref 11.5–15.5)
WBC: 7.6 10*3/uL (ref 4.0–10.5)
nRBC: 0 % (ref 0.0–0.2)

## 2022-04-20 LAB — BASIC METABOLIC PANEL
Anion gap: 5 (ref 5–15)
BUN: 20 mg/dL (ref 8–23)
CO2: 29 mmol/L (ref 22–32)
Calcium: 8.6 mg/dL — ABNORMAL LOW (ref 8.9–10.3)
Chloride: 105 mmol/L (ref 98–111)
Creatinine, Ser: 1.23 mg/dL (ref 0.61–1.24)
GFR, Estimated: >60 mL/min (ref >60)
Glucose, Bld: 91 mg/dL (ref 70–99)
Potassium: 4.1 mmol/L (ref 3.5–5.1)
Sodium: 139 mmol/L (ref 135–145)

## 2022-04-20 LAB — FOLATE: Folate: 9.9 ng/mL (ref >5.9)

## 2022-04-20 LAB — GLUCOSE, CAPILLARY
Glucose-Capillary: 89 mg/dL (ref 70–99)
Glucose-Capillary: 93 mg/dL (ref 70–99)

## 2022-04-20 LAB — VITAMIN B12: Vitamin B-12: 385 pg/mL (ref 180–914)

## 2022-04-20 MED ORDER — PREDNISONE 20 MG PO TABS
40.0000 mg | ORAL_TABLET | Freq: Every day | ORAL | 0 refills | Status: AC
Start: 2022-04-21 — End: 2022-04-23

## 2022-04-20 MED ORDER — AZITHROMYCIN 250 MG PO TABS: ORAL_TABLET | ORAL | 0 refills | Status: AC

## 2022-04-20 MED ORDER — ANORO ELLIPTA 62.5-25 MCG/ACT IN AEPB: 1.0000 | INHALATION_SPRAY | Freq: Every day | RESPIRATORY_TRACT | 2 refills | Status: AC

## 2022-04-20 NOTE — Progress Notes (Addendum)
Subjective: I seen and evaluated Nathaniel Garcia at bedside. He states improvement in his breathing; Saturating well when O2 was turned off during interview. He is concerned that his exacerbation is unpredictable and he is traveling next month, going on a cruise. He is worried about having another exacerbation while far away from a hospital. He notes worsening dyspnea on exertion at home, especially going up one upstairs at home. His albuterol inhaler does not seem to help and he denies having a long acting inhaler at home. However, uses his nebulizer constantly at home with minimal relief. He takes 5mg  Prednisone daily, he was recently seen by his Pulmonologist days prior to admission, with recent increase of prednisone dose 5mg -->10mg ; for which is has not started the new dose.   Objective:  Vital signs in last 24 hours: Vitals:   04/19/22 1957 04/19/22 2009 04/20/22 0100 04/20/22 0646  BP:  (!) 144/83 129/74 (!) 119/52  Pulse:  76 (!) 35 65  Resp:  20 20 20   Temp:  98.3 F (36.8 C) 97.9 F (36.6 C) 97.9 F (36.6 C)  TempSrc:  Oral Oral Oral  SpO2: 100% 100% 100% 100%  Weight:   59.8 kg   Height:       Physical Exam Constitutional:      General: He is not in acute distress.    Interventions: Nasal cannula in place.  HENT:     Head: Normocephalic and atraumatic.  Cardiovascular:     Rate and Rhythm: Normal rate.     Heart sounds: Normal heart sounds.  Pulmonary:     Breath sounds: Decreased breath sounds present. No wheezing, rhonchi or rales.  Abdominal:     General: Abdomen is flat.  Skin:    General: Skin is warm and dry.  Neurological:     General: No focal deficit present.     Mental Status: He is alert and oriented to person, place, and time.  Psychiatric:        Behavior: Behavior is cooperative.        Cognition and Memory: Cognition normal.     Assessment/Plan:  Principal Problem:   Acute respiratory failure with hypoxia (HCC) Active Problems:   COPD  exacerbation (HCC)   Coronary artery disease involving coronary bypass graft of native heart without angina pectoris  76yo man with severe COPD on chronic prednisone therapy, chronic hypoxic respiratory failure on 3Lnc at baseline, and prior CVA who presented with acute, progressive dyspnea, admitted with a COPD exacerbation in the setting of progression of underlying COPD.   #Acute on chronic COPD Overnight, patient successfully weaned off BiPAP. Currently requiring HFNC; home O2 requirement is 3L [place]. The goal is improvement in respiratory status, as close to baseline O2 requirement. PT/OT recommends no follow up. Will need ambulatory vitals to determine if patient DOE has improved. Lungs were clear on ascultation. Saturating >97% when O2 turned off during interview at rest. -Azithromycin 500mg  daily; End date: 04/23/22 -Prednisone 40mg  daily; End date: 04/23/22 -Continue Breo Ellipta daily -Continue scheduled Duonebs x4 daily -Continue Albuterol nebs q6H PRN -Continue Montelukast 10mg  QHS -Will need outpatient lama/laba inhaler at time of discharge -Ambulatory stats pending  #Normocytic Anemia Patient has past hx of alcohol use disorder. Since admission, Hgb stable at ~10 decreased from 12, no active signs of bleeding. Malnutrition vs alcohol use could be contributing to anemia. Patient feels well, denies any fatigue, weakness, or balance issues. -Vitamin b12 and folate level pending  #Prior CVA No active sign  of bleeding -Plavix 75mg  daily -Atorvastatin 80mg  daily  #BPH -Tamsulosin 0.4mg  daily  Prior to Admission Living Arrangement: Anticipated Discharge Location: Barriers to Discharge: Dispo: Anticipated discharge in approximately 1-2 day(s).   Timothy Lasso, MD 04/20/2022, 7:22 AM Pager: [phone] After 5pm on weekdays and 1pm on weekends: On Call pager [phone]

## 2022-04-20 NOTE — Progress Notes (Signed)
Pt breathing comfortably on 3L [place] at time of assessment. BIPAP not needed at this time. Pt aware that if he were to have any acute change in resp status, we will place him on BIPAP. RT will monitor as needed.

## 2022-04-20 NOTE — Discharge Instructions (Signed)
1.  Please continue taking all your medications as directed. 2.  Prescription for Anoro Ellipta has been sent to your pharmacy; please use this inhaler once daily. 3.  Please follow-up with your primary care doctor in 1 week 4.  Please follow-up with your pulmonologist in 1 week

## 2022-04-20 NOTE — Plan of Care (Signed)
  Problem: Education: Goal: Knowledge of General Education information will improve Description: Including pain rating scale, medication(s)/side effects and non-pharmacologic comfort measures Outcome: Progressing   Problem: Activity: Goal: Risk for activity intolerance will decrease Outcome: Progressing   Problem: Nutrition: Goal: Adequate nutrition will be maintained Outcome: Progressing   Problem: Coping: Goal: Level of anxiety will decrease Outcome: Progressing   Problem: Pain Managment: Goal: General experience of comfort will improve 04/20/2022 1516 by Dominga Ferry, RN Outcome: Progressing 04/20/2022 1515 by Dominga Ferry, RN Outcome: Progressing   Problem: Safety: Goal: Ability to remain free from injury will improve Outcome: Progressing

## 2022-04-20 NOTE — Progress Notes (Signed)
SATURATION QUALIFICATIONS: (This note is used to comply with regulatory documentation for home oxygen)  Patient Saturations on Room Air at Rest = 98%  Patient Saturations on Room Air while Ambulating = 86%  Patient Saturations on 1 Liters of oxygen while Ambulating = 92%  Please briefly explain why patient needs home oxygen:patient O2 was irregular and fluctuating, O2 sat on room air in the beginning of the walking test was above 93 but then it dropped after 300 feet to 86.

## 2022-04-20 NOTE — Discharge Summary (Signed)
Name: Nathaniel Garcia MRN: [id number] DOB: 16-Mar-1946 76 y.o. PCP: Duanne Limerick, MD  Date of Admission: 04/18/2022  8:09 AM Date of Discharge: 04/20/22 Attending Physician: Dr. Mayford Knife  Discharge Diagnosis: Principal Problem:   Acute respiratory failure with hypoxia [facility]) Active Problems:   COPD exacerbation (HCC)   Coronary artery disease involving coronary bypass graft of native heart without angina pectoris    Discharge Medications: Allergies as of 04/20/2022       Reactions   Hctz [hydrochlorothiazide] Rash        Medication List     STOP taking these medications    doxycycline 100 MG tablet Commonly known as: VIBRA-TABS       TAKE these medications    albuterol 108 (90 Base) MCG/ACT inhaler Commonly known as: VENTOLIN HFA Inhale 2 puffs into the lungs every 6 (six) hours as needed for wheezing or shortness of breath.   Anoro Ellipta 62.5-25 MCG/ACT Aepb Generic drug: umeclidinium-vilanterol Inhale 1 puff into the lungs daily.   atorvastatin 80 MG tablet Commonly known as: LIPITOR Take 1 tablet (80 mg total) by mouth daily.   azithromycin 250 MG tablet Commonly known as: ZITHROMAX Take once daily Start taking on: April 21, 2022   benzonatate 200 MG capsule Commonly known as: TESSALON Take 200 mg by mouth 3 (three) times daily as needed for cough.   budesonide 0.25 MG/2ML nebulizer solution Commonly known as: PULMICORT Use 1 vial (0.25 mg total) by nebulization 2 (two) times daily.   clopidogrel 75 MG tablet Commonly known as: PLAVIX TAKE 1 TABLET(75 MG) BY MOUTH DAILY   ipratropium-albuterol 0.5-2.5 (3) MG/3ML Soln Commonly known as: DUONEB Use 1 vial by nebulization 4 (four) times daily.   montelukast 10 MG tablet Commonly known as: SINGULAIR Take 1 tablet (10 mg total) by mouth at bedtime.   mupirocin ointment 2 % Commonly known as: BACTROBAN Apply 1 application. topically 2 (two) times daily.   predniSONE 20 MG  tablet Commonly known as: DELTASONE Take 2 tablets (40 mg total) by mouth daily with breakfast for 2 days. Start taking on: April 21, 2022 What changed:  medication strength how much to take   sertraline 25 MG tablet Commonly known as: ZOLOFT Take 1 tablet (25 mg total) by mouth at bedtime.   tamsulosin 0.4 MG Caps capsule Commonly known as: FLOMAX TAKE 1 CAPSULE(0.4 MG) BY MOUTH DAILY What changed: See the new instructions.        Disposition and follow-up:   NathanielAzaryah Lumir Garcia was discharged from [facility] in Stable condition.  At the hospital follow up visit please address:  1.  Follow-up:  a.  Follow-up with primary care doctor: Ensure patient completes antibiotics and prednisone regimen.  Patient is on home oxygen 3 L nasal cannula and uses a walker to ambulate at home.  Concerns for assistance with daily ADLs and IADLs; patient needs referral to social worker for home health assistance.  Follow-up with vitamin B12 and folate levels as patient found to have normocytic anemia during hospitalization.    b.  Follow-up with pulmonologist: Patient is chronically on prednisone daily of 5 mg, which was recently increased to prednisone 10 mg daily.  Per patient, he has not started this new dosing.  In the setting of recent COPD exacerbation patient was started on prednisone 40 mg daily, end date 04/23/2022.  Chronic dose was temporarily discontinued.  Please have patient restart chronic prednisone 10 mg daily after end date of COPD  exacerbation treatment.   c.   d.  2.  Labs / imaging needed at time of follow-up: None  3.  Pending labs/ test needing follow-up: Vitamin B12 and Folate levels  4.  Medication Changes  Started: Prednisone 40 mg daily, end date 04/23/2022; Anoro Ellipta 1 puff daily  Stopped: Prednisone 5 mg daily  Abx -azithromycin 500 mg End Date: 04/23/22  Follow-up Appointments:  Follow-up Information     Juline Patch, MD. Call in 1  week(s).   Specialty: Family Medicine Contact information: [address] [place] [address] [phone]         Erby Pian, MD Follow up in 1 week(s).   Specialty: Specialist Contact information: [place] [address] [place] [facility] Course by problem list: 76yo man with severe COPD on chronic prednisone therapy, chronic hypoxic respiratory failure on 3Lnc at baseline, and prior CVA who presented with acute, progressive dyspnea, admitted with a COPD exacerbation in the setting of progression of underlying COPD  Acute on chronic COPD Patient presented with SOB, noted to be in respiratory distress with accessory muscle usage and audible wheezing. He received 1 dose of Solu-Medrol in the ED, as well as albuterol and magnesium.  He was placed on continuous albuterol and received 1 dose of epinephrine.  Overnight he was placed on BiPAP which improved his respiratory status.  He was then transitioned from BiPAP to high flow nasal cannula.  Chest x-ray negative for active cardiopulmonary disease.  CBC and BMP were unremarkable.  Troponins negative and BNP within normal limits.  Patient was started on azithromycin 500mg  and prednisone 40 mg.  Scheduled DuoNebs every 6 hours, as needed albuterol inhaler and Breo Ellipta 1 puff daily.  Patient's breathing status continue to improve.  PT/OT evaluation recommended no follow-up outpatient.  Ambulatory saturation: Noted to desat to 86% on room air with ambulation and improved to 92% with 1 L nasal cannula with ambulation.  Results consistent with his baseline of COPD with home oxygen requirement.  No indication to increase home oxygen requirement.  At time of discharge, patient was stable.  Respiratory status returned to baseline.  Normocytic anemia During hospitalization, on repeat CBC patient noted to have decrease in hemoglobin from 12-->10.  No active signs of bleeding noted.   Patient has a past history of alcohol use disorder and has a BMI of 17.  Malnutrition versus alcohol use could be contributing to anemia.  Patient was asymptomatic.  Vitamin B12 and folate levels pending.  Blood work to be followed up outpatient with PCP.  Prior CVA No active signs of bleeding.  Home medication include Plavix 75 mg daily and atorvastatin 80 mg daily, which were continued throughout hospitalization.  BPH Home medication include tamsulosin 0.4 mg daily which was continued throughout hospitalization.  Discharge Subjective: Patient endorse improvement in his breathing. Denies any complaints at this time.  Discharge Exam:   BP (!) 139/57 (BP Location: Left Arm)   Pulse 83   Temp 98.1 F (36.7 C) (Oral)   Resp 20   Ht 6' (1.829 m)   Wt 59.8 kg   SpO2 94%   BMI 17.89 kg/m  Constitutional: normal-appearing elderly gentleman sitting in the bed, in no acute distress HENT: normocephalic atraumatic, mucous membranes moist Eyes: conjunctiva non-erythematous Neck: supple Cardiovascular: regular rate and rhythm, no m/r/g Pulmonary/Chest: normal work of breathing on nasal  cannula, decreased breath sounds bilaterally; no wheezing, rales or rhonchi Abdominal: soft, non-tender, non-distended MSK: normal bulk and tone Neurological: alert & oriented x 3, normal gait Skin: warm and dry Psych: Normal mood, normal behavior    Pertinent Labs, Studies, and Procedures:     Latest Ref Rng & Units 04/20/2022    4:29 AM 04/19/2022    3:54 AM 04/18/2022    8:17 AM  CBC  WBC 4.0 - 10.5 K/uL 7.6   8.2   7.8    Hemoglobin 13.0 - 17.0 g/dL 10.7   10.8   12.6    Hematocrit 39.0 - 52.0 % 32.3   32.7   39.4    Platelets 150 - 400 K/uL 150   162   162         Latest Ref Rng & Units 04/20/2022    4:29 AM 04/19/2022    3:54 AM 04/18/2022    8:17 AM  CMP  Glucose 70 - 99 mg/dL 91   117   125    BUN 8 - 23 mg/dL 20   19   11     Creatinine 0.61 - 1.24 mg/dL 1.23   1.12   1.12    Sodium 135 - 145  mmol/L 139   140   142    Potassium 3.5 - 5.1 mmol/L 4.1   4.4   3.8    Chloride 98 - 111 mmol/L 105   107   107    CO2 22 - 32 mmol/L 29   28   29     Calcium 8.9 - 10.3 mg/dL 8.6   8.8   8.7    Total Protein 6.5 - 8.1 g/dL   6.5    Total Bilirubin 0.3 - 1.2 mg/dL   0.8    Alkaline Phos 38 - 126 U/L   42    AST 15 - 41 U/L   33    ALT 0 - 44 U/L   40      DG Chest Port 1 View  Result Date: 04/18/2022 CLINICAL DATA:  Shortness of breath, wheezing. EXAM: PORTABLE CHEST 1 VIEW COMPARISON:  10/29/2021 and CT chest 08/07/2021. FINDINGS: Trachea is midline. Heart size normal. Lungs are severely emphysematous. No airspace consolidation or pleural fluid. IMPRESSION: No acute findings. Electronically Signed   By: Lorin Picket M.D.   On: 04/18/2022 08:58     Discharge Instructions: Discharge Instructions     Call MD for:  difficulty breathing, headache or visual disturbances   Complete by: As directed    Call MD for:  extreme fatigue   Complete by: As directed    Call MD for:  hives   Complete by: As directed    Call MD for:  persistant dizziness or light-headedness   Complete by: As directed    Call MD for:  persistant nausea and vomiting   Complete by: As directed    Call MD for:  redness, tenderness, or signs of infection (pain, swelling, redness, odor or green/yellow discharge around incision site)   Complete by: As directed    Call MD for:  severe uncontrolled pain   Complete by: As directed    Call MD for:  temperature >100.4   Complete by: As directed    Diet - low sodium heart healthy   Complete by: As directed    Increase activity slowly   Complete by: As directed        Signed: Timothy Lasso, MD Internal Medicine  Resident Pager: [phone]

## 2022-04-21 ENCOUNTER — Telehealth: Payer: Self-pay

## 2022-04-21 ENCOUNTER — Ambulatory Visit: Payer: Medicare HMO | Admitting: Urology

## 2022-04-21 NOTE — Telephone Encounter (Signed)
Transition Care Management Follow-up Telephone Call Date of discharge and from where: 04/20/22 [facility] How have you been since you were released from the hospital? Pt states he is doing better Any questions or concerns? No  Items Reviewed: Did the pt receive and understand the discharge instructions provided? Yes  Medications obtained and verified? Yes  Other? No  Any new allergies since your discharge? No  Dietary orders reviewed? Yes Do you have support at home? Yes   Home Care and Equipment/Supplies: Were home health services ordered? no -per discharge summary pt was to receive referral to social work/transition of care team for home health. Pt states he has not heard anything. No active referral or orders. Pt will need CCM referral to social work or home health orders given by PCP. Were any new equipment or medical supplies ordered?  No   Functional Questionnaire: (I = Independent and D = Dependent) ADLs: I  Bathing/Dressing- I  Meal Prep- I  Eating- I  Maintaining continence- I  Transferring/Ambulation- I  Managing Meds- I  Follow up appointments reviewed:  PCP Hospital f/u appt confirmed? Yes  Scheduled to see Dr. Yetta Barre on 04/25/22 @ 10:20. Specialist Hospital f/u appt confirmed? No   awaiting appt with Dr. Meredeth Ide. Are transportation arrangements needed? No  If their condition worsens, is the pt aware to call PCP or go to the Emergency Dept.? Yes Was the patient provided with contact information for the PCP's office or ED? Yes Was to pt encouraged to call back with questions or concerns? Yes

## 2022-04-21 NOTE — Progress Notes (Incomplete)
04/21/22 6:18 AM   Nathaniel Garcia [birth year] [id number]  Referring provider:  Juline Patch, MD [address] [place],  [place] [address] No chief complaint on file.   Urological history  1. High risk hematuria -smoker -CTU 09/2016 revealed adrenal glands are unremarkable. No urinary stones. Ureters are decompressed. No filling defects in the intrarenal collecting systems, ureters or bladder.Prostate is enlarged and slightly indents the bladder base. -cystoscopy in January 2018 and no worrisome findings were discovered.  -RUS on 08/12/2018 revealed right kidney is rather small with slight increase in renal echogenicity. Question renal artery stenosis as a potential etiology for these findings. In this regard, question whether patient is hypertensive. Note that similar changes of this nature are not noted on the left.  No obstructing focus in either kidney.  No renal mass evident.  Mildly prominent prostate with mild inhomogeneous echotexture within the prostate.   -Cystoscopy with Dr. Diamantina [facility] 07/2019 was NED -CTU 2022 - NED  2. BPH with LU TS -PSA 1.9 in 2020 -aged out of screening -managed with tamsulosin 0.4 mg daily   3. Penile Lesion  - In December 2022, he was admitted for a COPD exacerbation.  There was also an incidental finding of a penile lesion, so he had STI testing.  HIV, RPR and GC/Chlamydia were negative    HPI: Nathaniel Garcia is a 76 y.o.male who presents today for further evaluation of penile lesion.           PMH: Past Medical History:  Diagnosis Date   COPD (chronic obstructive pulmonary disease) ([place])    Hematuria    Hyperlipidemia    Stroke [facility])     Surgical History: Past Surgical History:  Procedure Laterality Date   COLONOSCOPY  2011   normal- [facility] docs    Home Medications:  Allergies as of 04/21/2022       Reactions   Hctz [hydrochlorothiazide] Rash        Medication List        Accurate as of April 21, 2022  6:18 AM. If you have any questions, ask your nurse or doctor.          albuterol 108 (90 Base) MCG/ACT inhaler Commonly known as: VENTOLIN HFA Inhale 2 puffs into the lungs every 6 (six) hours as needed for wheezing or shortness of breath.   Anoro Ellipta 62.5-25 MCG/ACT Aepb Generic drug: umeclidinium-vilanterol Inhale 1 puff into the lungs daily.   atorvastatin 80 MG tablet Commonly known as: LIPITOR Take 1 tablet (80 mg total) by mouth daily.   azithromycin 250 MG tablet Commonly known as: ZITHROMAX Take once daily   benzonatate 200 MG capsule Commonly known as: TESSALON Take 200 mg by mouth 3 (three) times daily as needed for cough.   budesonide 0.25 MG/2ML nebulizer solution Commonly known as: PULMICORT Use 1 vial (0.25 mg total) by nebulization 2 (two) times daily.   clopidogrel 75 MG tablet Commonly known as: PLAVIX TAKE 1 TABLET(75 MG) BY MOUTH DAILY   ipratropium-albuterol 0.5-2.5 (3) MG/3ML Soln Commonly known as: DUONEB Use 1 vial by nebulization 4 (four) times daily.   montelukast 10 MG tablet Commonly known as: SINGULAIR Take 1 tablet (10 mg total) by mouth at bedtime.   mupirocin ointment 2 % Commonly known as: BACTROBAN Apply 1 application. topically 2 (two) times daily.   predniSONE 20 MG tablet Commonly known as: DELTASONE Take 2 tablets (40 mg total) by mouth daily with breakfast for 2 days.  sertraline 25 MG tablet Commonly known as: ZOLOFT Take 1 tablet (25 mg total) by mouth at bedtime.   tamsulosin 0.4 MG Caps capsule Commonly known as: FLOMAX TAKE 1 CAPSULE(0.4 MG) BY MOUTH DAILY What changed: See the new instructions.        Allergies:  Allergies  Allergen Reactions   Hctz [Hydrochlorothiazide] Rash    Family History: Family History  Problem Relation Age of Onset   Prostate cancer Neg Hx     Social History:  reports that he quit smoking about 17 months ago. His smoking use included cigarettes. He has a 1.20  pack-year smoking history. He has been exposed to tobacco smoke. He has never used smokeless tobacco. He reports current alcohol use of about 2.0 standard drinks per week. He reports that he does not currently use drugs.   Physical Exam: There were no vitals taken for this visit.  Constitutional:  Alert and oriented, No acute distress. HEENT: [place] AT, moist mucus membranes.  Trachea midline, no masses. Cardiovascular: No clubbing, cyanosis, or edema. Respiratory: Normal respiratory effort, no increased work of breathing. GU:  No CVA tenderness.  No bladder fullness or masses.  Patient with uncircumcised phallus. Foreskin easily retracted  Urethral meatus is patent.  No penile discharge. No penile lesions or rashes. Scrotum without lesions, cysts, rashes and/or edema.  Bilateral hydroceles, testicles are located scrotally bilaterally. No masses are appreciated in the testicles. Left and right epididymis are normal., Skin: No rashes, bruises or suspicious lesions. Neurologic: Grossly intact, no focal deficits, moving all 4 extremities. Psychiatric: Normal mood and affect.   Laboratory Data: Lab Results  Component Value Date   CREATININE 1.23 04/20/2022   Component     Latest Ref Rng 04/20/2022  Glucose     70 - 99 mg/dL 91   BUN     8 - 23 mg/dL 20   Creatinine     0.61 - 1.24 mg/dL 1.23   Sodium     135 - 145 mmol/L 139   Potassium     3.5 - 5.1 mmol/L 4.1   Chloride     98 - 111 mmol/L 105   CO2     22 - 32 mmol/L 29   Calcium     8.9 - 10.3 mg/dL 8.6 (L)   GFR, Estimated     >60 mL/min >60   Anion gap     5 - 15  5     (L) Low  Component     Latest Ref Rng 04/19/2022 04/20/2022  WBC     4.0 - 10.5 K/uL 8.2  7.6   RBC     4.22 - 5.81 MIL/uL 3.26 (L)  3.20 (L)   Hemoglobin     13.0 - 17.0 g/dL 10.8 (L)  10.7 (L)   HCT     39.0 - 52.0 % 32.7 (L)  32.3 (L)   MCV     80.0 - 100.0 fL 100.3 (H)  100.9 (H)   MCH     26.0 - 34.0 pg 33.1  33.4   MCHC     30.0 - 36.0 g/dL  33.0  33.1   RDW     11.5 - 15.5 % 13.0  12.9   Platelets     150 - 400 K/uL 162  150   Neutrophils     % 81    Lymphs     Not Estab. %    Monocytes     Not Estab. %  Eos     Not Estab. %    Basos     Not Estab. %    NEUT#     1.7 - 7.7 K/uL 6.5    Lymphocyte #     0.7 - 4.0 K/uL 0.8    Monocytes Absolute     0.1 - 0.9 x10E3/uL    EOS (ABSOLUTE)     0.0 - 0.4 x10E3/uL    Basophils Absolute     0.0 - 0.1 K/uL 0.0    Immature Granulocytes     % 0    Immature Grans (Abs)     0.0 - 0.1 x10E3/uL    WBC, UA     0 - 5 /hpf    Epithelial Cells (non renal)     0 - 10 /hpf    Bacteria, UA     None seen/Few     nRBC     0.0 - 0.2 % 0.0  0.0   pH, Arterial     7.350 - 7.450     pCO2 arterial     32.0 - 48.0 mmHg    pO2, Arterial     83.0 - 108.0 mmHg    Bicarbonate     20.0 - 28.0 mmol/L    TCO2     22 - 32 mmol/L    O2 Saturation     %    Acid-base deficit     0.0 - 2.0 mmol/L    Sodium     135 - 145 mmol/L    Potassium     3.5 - 5.1 mmol/L    Calcium Ionized     1.15 - 1.40 mmol/L    Patient temperature    Collection site    Drawn by    Sample type    Lymphocytes     % 9    Monocytes Relative     % 10    Monocyte #     0.1 - 1.0 K/uL 0.8    Eosinophil     % 0    Eosinophils Absolute     0.0 - 0.5 K/uL 0.0    Basophil     % 0    Abs Immature Granulocytes     0.00 - 0.07 K/uL 0.02    Acid-Base Excess     0.0 - 2.0 mmol/L    pH, Ven     7.250 - 7.430     pCO2, Ven     44.0 - 60.0 mmHg    pO2, Ven     32.0 - 45.0 mmHg    Comment      (L) Low (H) High  Urinalysis   Pertinent Imaging:   Assessment & Plan:     No follow-ups on file.  [place] [address] [place], [place] [address] [phone]  I,Kailey Littlejohn,acting as a scribe for [facility], PA-C.,have documented all relevant documentation on the behalf of SHANNON MCGOWAN, PA-C,as directed by  [facility] [place], PA-C while in  the presence of [place], PA-C.

## 2022-04-23 LAB — CULTURE, BLOOD (ROUTINE X 2)
Culture: NO GROWTH
Special Requests: ADEQUATE

## 2022-04-25 ENCOUNTER — Ambulatory Visit (INDEPENDENT_AMBULATORY_CARE_PROVIDER_SITE_OTHER): Payer: Medicare HMO | Admitting: Family Medicine

## 2022-04-25 ENCOUNTER — Encounter: Payer: Self-pay | Admitting: Family Medicine

## 2022-04-25 VITALS — BP 120/76 | HR 80 | Ht 72.0 in | Wt 136.0 lb

## 2022-04-25 DIAGNOSIS — E871 Hypo-osmolality and hyponatremia: Secondary | ICD-10-CM

## 2022-04-25 DIAGNOSIS — J432 Centrilobular emphysema: Secondary | ICD-10-CM

## 2022-04-25 DIAGNOSIS — J449 Chronic obstructive pulmonary disease, unspecified: Secondary | ICD-10-CM

## 2022-04-25 DIAGNOSIS — G4733 Obstructive sleep apnea (adult) (pediatric): Secondary | ICD-10-CM | POA: Diagnosis not present

## 2022-04-25 DIAGNOSIS — J439 Emphysema, unspecified: Secondary | ICD-10-CM | POA: Diagnosis not present

## 2022-04-25 DIAGNOSIS — D539 Nutritional anemia, unspecified: Secondary | ICD-10-CM

## 2022-04-25 DIAGNOSIS — J441 Chronic obstructive pulmonary disease with (acute) exacerbation: Secondary | ICD-10-CM | POA: Diagnosis not present

## 2022-04-25 LAB — CULTURE, BLOOD (ROUTINE X 2): Special Requests: ADEQUATE

## 2022-04-25 NOTE — Progress Notes (Signed)
Date:  04/25/2022   Name:  Nathaniel Garcia   DOB:  June 04, 1946   MRN:  [id number]   Chief Complaint: Follow-up [facility] follow up- Acute resp. Failure. D/c 04/20/22. TOC call 04/21/22)  Pt was recently admitted to [facility] for COPD exacerbation on 6/2 and was discharged on 6/4. Transition of care call placed on 6/5.Marland Kitchen       Shortness of Breath This is a chronic problem. The current episode started more than 1 year ago. The problem occurs intermittently. The problem has been gradually improving. Pertinent negatives include no abdominal pain, chest pain, ear pain, fever, headaches, leg swelling, neck pain, orthopnea, PND, rash, sore throat or wheezing. The symptoms are aggravated by URIs. He has tried beta agonist inhalers, ipratropium inhalers, oral steroids and steroid inhalers (azithromycin) for the symptoms. The treatment provided moderate relief.    Lab Results  Component Value Date   NA 139 04/20/2022   K 4.1 04/20/2022   CO2 29 04/20/2022   GLUCOSE 91 04/20/2022   BUN 20 04/20/2022   CREATININE 1.23 04/20/2022   CALCIUM 8.6 (L) 04/20/2022   EGFR 61 11/14/2021   GFRNONAA >60 04/20/2022   Lab Results  Component Value Date   CHOL 183 01/10/2022   HDL 61 01/10/2022   LDLCALC 110 (H) 01/10/2022   TRIG 65 01/10/2022   CHOLHDL 3.3 06/29/2018   Lab Results  Component Value Date   TSH 1.650 05/30/2019   No results found for: "HGBA1C" Lab Results  Component Value Date   WBC 7.6 04/20/2022   HGB 10.7 (L) 04/20/2022   HCT 32.3 (L) 04/20/2022   MCV 100.9 (H) 04/20/2022   PLT 150 04/20/2022   Lab Results  Component Value Date   ALT 40 04/18/2022   AST 33 04/18/2022   ALKPHOS 42 04/18/2022   BILITOT 0.8 04/18/2022   No results found for: "25OHVITD2", "25OHVITD3", "VD25OH"   Review of Systems  Constitutional:  Negative for chills and fever.  HENT:  Negative for drooling, ear discharge, ear pain and sore throat.   Respiratory:  Positive for shortness of  breath. Negative for cough and wheezing.   Cardiovascular:  Negative for chest pain, palpitations, orthopnea, leg swelling and PND.  Gastrointestinal:  Negative for abdominal pain, blood in stool, constipation, diarrhea and nausea.  Endocrine: Negative for polydipsia.  Genitourinary:  Negative for dysuria, frequency, hematuria and urgency.  Musculoskeletal:  Negative for back pain, myalgias and neck pain.  Skin:  Negative for rash.  Allergic/Immunologic: Negative for environmental allergies.  Neurological:  Negative for dizziness and headaches.  Hematological:  Does not bruise/bleed easily.  Psychiatric/Behavioral:  Negative for suicidal ideas. The patient is not nervous/anxious.     Patient Active Problem List   Diagnosis Date Noted   Coronary artery disease involving coronary bypass graft of native heart without angina pectoris    Junctional escape rhythm 10/30/2021   Abnormal EKG 10/30/2021   Acute and chronic respiratory failure with hypoxia ([place]) 10/30/2021   COPD with acute exacerbation ([place]) 01/03/2021   Bullous emphysema ([place]) 01/03/2021   Acute exacerbation of chronic obstructive pulmonary disease (COPD) ([place]) 10/31/2020   COPD exacerbation ([place]) 10/29/2020   Acute respiratory failure with hypoxia ([place]) 10/29/2020   History of stroke 10/29/2020   Aortic atherosclerosis ([place]) 12/30/2018   Benign hematuria 09/18/2016   Weakness of left hand 07/02/2016   Lipid screening 01/03/2016   Cerebral vascular disease 09/14/2015   Carotid artery narrowing 08/15/2015   Bilateral carotid artery stenosis  08/15/2015   Bradycardia 08/01/2015   H/O transient cerebral ischemia 08/01/2015   Combined fat and carbohydrate induced hyperlipemia 08/01/2015   Breathlessness on exertion 07/30/2015   Absolute anemia 04/03/2015   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[place]) 04/03/2015   Cerebral vascular accident ([place]) 04/03/2015   HLD (hyperlipidemia) 04/03/2015   Blood glucose elevated 04/03/2015   Lung  nodule, solitary 04/03/2015   Non compliance with medical treatment 04/03/2015   Lung nodule, multiple 04/03/2015   Cerebral infarction ([place]) 04/03/2015   Abnormal lung field 04/03/2015    Allergies  Allergen Reactions   Hctz [Hydrochlorothiazide] Rash    Past Surgical History:  Procedure Laterality Date   COLONOSCOPY  2011   normal- MD docs    Social History   Tobacco Use   Smoking status: Former    Packs/day: 0.02    Years: 60.00    Total pack years: 1.20    Types: Cigarettes    Quit date: 10/26/2020    Years since quitting: 1.4    Passive exposure: Past   Smokeless tobacco: Never   Tobacco comments:    [place] smoking cessation information provided  Vaping Use   Vaping Use: Never used  Substance Use Topics   Alcohol use: Yes    Alcohol/week: 2.0 standard drinks of alcohol    Types: 2 Cans of beer per week    Comment: weekly   Drug use: Not Currently    Comment: marijuana     Medication list has been reviewed and updated.  Current Meds  Medication Sig   albuterol (VENTOLIN HFA) 108 (90 Base) MCG/ACT inhaler Inhale 2 puffs into the lungs every 6 (six) hours as needed for wheezing or shortness of breath.   atorvastatin (LIPITOR) 80 MG tablet Take 1 tablet (80 mg total) by mouth daily.   benzonatate (TESSALON) 200 MG capsule Take 200 mg by mouth 3 (three) times daily as needed for cough.   budesonide (PULMICORT) 0.25 MG/2ML nebulizer solution Use 1 vial (0.25 mg total) by nebulization 2 (two) times daily.   clopidogrel (PLAVIX) 75 MG tablet TAKE 1 TABLET(75 MG) BY MOUTH DAILY   ipratropium-albuterol (DUONEB) 0.5-2.5 (3) MG/3ML SOLN Use 1 vial by nebulization 4 (four) times daily.   montelukast (SINGULAIR) 10 MG tablet Take 1 tablet (10 mg total) by mouth at bedtime.   mupirocin ointment (BACTROBAN) 2 % Apply 1 application. topically 2 (two) times daily.   sertraline (ZOLOFT) 25 MG tablet Take 1 tablet (25 mg total) by mouth at bedtime.   tamsulosin (FLOMAX)  0.4 MG CAPS capsule TAKE 1 CAPSULE(0.4 MG) BY MOUTH DAILY (Patient taking differently: Take 0.4 mg by mouth daily. TAKE 1 CAPSULE(0.4 MG) BY MOUTH DAILY)   umeclidinium-vilanterol (ANORO ELLIPTA) 62.5-25 MCG/ACT AEPB Inhale 1 puff into the lungs daily.       04/25/2022   10:23 AM 03/27/2022    9:11 AM 01/10/2022   10:16 AM 10/09/2021   10:37 AM  GAD 7 : Generalized Anxiety Score  Nervous, Anxious, on Edge 0 0 0 0  Control/stop worrying 0 0 0 0  Worry too much - different things 0 0 0 0  Trouble relaxing 0 0 0 0  Restless 0 0 0 0  Easily annoyed or irritable 0 0 0 0  Afraid - awful might happen 0 0 0 0  Total GAD 7 Score 0 0 0 0  Anxiety Difficulty Not difficult at all Not difficult at all Not difficult at all Not difficult at all  04/25/2022   10:23 AM  Depression screen PHQ 2/9  Decreased Interest 0  Down, Depressed, Hopeless 0  PHQ - 2 Score 0  Altered sleeping 0  Tired, decreased energy 0  Change in appetite 0  Feeling bad or failure about yourself  0  Trouble concentrating 0  Moving slowly or fidgety/restless 0  Suicidal thoughts 0  PHQ-9 Score 0  Difficult doing work/chores Not difficult at all    BP Readings from Last 3 Encounters:  04/25/22 120/76  04/20/22 (!) 139/57  03/27/22 110/80    Physical Exam Vitals and nursing note reviewed.  Constitutional:      Appearance: He is normal weight.  HENT:     Right Ear: Tympanic membrane and ear canal normal.     Left Ear: Tympanic membrane and ear canal normal.     Nose: Nose normal.     Mouth/Throat:     Mouth: Mucous membranes are moist.     Pharynx: Oropharynx is clear.  Eyes:     Conjunctiva/sclera: Conjunctivae normal.  Cardiovascular:     Heart sounds: Normal heart sounds. No murmur heard.    No friction rub. No gallop.  Pulmonary:     Effort: No respiratory distress.     Breath sounds: Decreased air movement present. No stridor. No wheezing, rhonchi or rales.     Comments: Exam normal for  patient Chest:     Chest wall: No tenderness.  Neurological:     Mental Status: He is alert.   [facility] Discharge Acute Issues Care Follow Up                                                                        Patient Demographics  Beryle Zeitz, is a 76 y.o. male  DOB Jan 13, 1946  MRN [id number].  Primary MD  Juline Patch, MD   Reason for TCC follow Up -specifically if patient completed prednisone and antibiotic therapy, follow-up on anemia of the macrocytic nature to see if C58 folic acid is necessary, follow-up on decreased sodium on admission and whether or not electrolytes need to be further monitored   Past Medical History:  Diagnosis Date   COPD (chronic obstructive pulmonary disease) ([place])    Hematuria    Hyperlipidemia    Stroke [facility])     Past Surgical History:  Procedure Laterality Date   COLONOSCOPY  2011   normal- [facility] docs       Recent HPI and Hospital Course  Patient's follow-up from hospital in which he received intense nebulization therapy with discharge on Anoro in addition to previously prescribed inhalations.  Patient is also on oxygen at night  [place] to be followed in the Clinic   Patient has done well at home is taking his nebulizations and medications on regular basis however I have concerns that he may start having issues with his timing of nebulization.  Also patient states that he needs to go to  [place] to see his kids however I question if this is going to be able to be accomplished and that he has oxygen needs as well as nebulization and other breathing concerns.   Subjective:   [place] Nine today has, No headache, No chest pain, No abdominal pain - No Nausea, No new weakness tingling or numbness, No Cough - SOB.  Patient is at baseline for his COPD with shortness of breath with exertion such  as climbing stairs and walking distances which recovers quickly when stops.  There is no dyspnea at rest.  Patient relates no cough or other symptomatology of COPD exacerbation patient is follow-up from hospital for which he received nebulizations and inhalation therapy.  Assessment & Plan    1. Stage 4 very severe COPD by GOLD classification ([place]) See below  2. 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[place]) See below  3. Hyponatremia See below - Renal Function Panel  4. Macrocytic anemia See below - B12 and Folate Panel - CBC with Differential/Platelet    Reason for frequent admissions/ER visits **      Objective:   Vitals:   04/25/22 1018  BP: 120/76  Pulse: 80  SpO2: 97%  Weight: 136 lb (61.7 kg)  Height: 6' (1.829 m)    Wt Readings from Last 3 Encounters:  04/25/22 136 lb (61.7 kg)  04/20/22 131 lb 14.4 oz (59.8 kg)  03/27/22 137 lb (62.1 kg)    Allergies as of 04/25/2022       Reactions   Hctz [hydrochlorothiazide] Rash        Medication List        Accurate as of April 25, 2022 10:59 AM. If you have any questions, ask your nurse or doctor.          albuterol 108 (90 Base) MCG/ACT inhaler Commonly known as: VENTOLIN HFA Inhale 2 puffs into the lungs every 6 (six) hours as needed for wheezing or shortness of breath.   Anoro Ellipta 62.5-25 MCG/ACT Aepb Generic drug: umeclidinium-vilanterol Inhale 1 puff into the lungs daily.   atorvastatin 80 MG tablet Commonly known as: LIPITOR Take 1 tablet (80 mg total) by mouth daily.   benzonatate 200 MG capsule Commonly known as: TESSALON Take 200 mg by mouth 3 (three) times daily as needed for cough.   budesonide 0.25 MG/2ML nebulizer solution Commonly known as: PULMICORT Use 1 vial (0.25 mg total) by nebulization 2 (two) times daily.   clopidogrel 75 MG tablet Commonly known as: PLAVIX TAKE 1 TABLET(75 MG) BY MOUTH DAILY   ipratropium-albuterol 0.5-2.5 (3) MG/3ML Soln Commonly known as: DUONEB Use 1 vial by  nebulization 4 (four) times daily.   montelukast 10 MG tablet Commonly known as: SINGULAIR Take 1 tablet (10 mg total) by mouth at bedtime.   mupirocin ointment 2 % Commonly known as: BACTROBAN Apply 1 application. topically 2 (two) times daily.   sertraline 25 MG tablet Commonly known as: ZOLOFT Take 1 tablet (25 mg total) by mouth at bedtime.   tamsulosin 0.4 MG Caps capsule Commonly known as: FLOMAX TAKE 1 CAPSULE(0.4 MG) BY MOUTH DAILY What changed: See the new instructions.         Physical Exam: Constitutional: Patient appears well-developed and well-nourished. Not in obvious distress. HENT: Normocephalic, atraumatic, External right and left ear normal. Oropharynx is clear and moist.  Eyes: Conjunctivae and EOM are normal. PERRLA, no scleral icterus. Neck: Normal ROM. Neck supple. No JVD. No tracheal deviation. No thyromegaly. CVS: RRR, S1/S2 +, no murmurs, no gallops, no  carotid bruit.  Pulmonary: Effort and breath sounds normal, no stridor, rhonchi, wheezes, rales.  Abdominal: Soft. BS +, no distension, tenderness, rebound or guarding.  Musculoskeletal: Normal range of motion. No edema and no tenderness.  Lymphadenopathy: No lymphadenopathy noted, cervical, inguinal or axillary Neuro: Alert. Normal reflexes, muscle tone coordination. No cranial nerve deficit. Skin: Skin is warm and dry. No rash noted. Not diaphoretic. No erythema. No pallor. Psychiatric: Normal mood and affect. Behavior, judgment, thought content normal.   Data Review   Micro Results Recent Results (from the past 240 hour(s))  Culture, blood (routine x 2)     Status: None (Preliminary result)   Collection Time: 04/18/22  8:23 AM   Specimen: BLOOD  Result Value Ref Range Status   Specimen Description BLOOD SITE NOT SPECIFIED  Final   Special Requests   Final    BOTTLES DRAWN AEROBIC AND ANAEROBIC Blood Culture adequate volume   Culture  Setup Time   Final    GRAM POSITIVE RODS AEROBIC BOTTLE  ONLY CRITICAL RESULT CALLED TO, READ BACK BY AND VERIFIED WITH: MSG THROUGH EPIC TO EMILEY MULLEN MD AT 3903 ON 04/21/2022 BY T.SAAD.    Culture   Final    CULTURE REINCUBATED FOR BETTER GROWTH Performed at [facility], [place] [address], [place], [place] [address]    Report Status PENDING  Incomplete  Culture, blood (routine x 2)     Status: None   Collection Time: 04/18/22  8:30 AM   Specimen: BLOOD  Result Value Ref Range Status   Specimen Description BLOOD SITE NOT SPECIFIED  Final   Special Requests   Final    BOTTLES DRAWN AEROBIC AND ANAEROBIC Blood Culture adequate volume   Culture   Final    NO GROWTH 5 DAYS Performed at [facility], [address], [place],  [address]    Report Status 04/23/2022 FINAL  Final     CBC Recent Labs  Lab 04/19/22 0354 04/20/22 0429  WBC 8.2 7.6  HGB 10.8* 10.7*  HCT 32.7* 32.3*  PLT 162 150  MCV 100.3* 100.9*  MCH 33.1 33.4  MCHC 33.0 33.1  RDW 13.0 12.9  LYMPHSABS 0.8  --   MONOABS 0.8  --   EOSABS 0.0  --   BASOSABS 0.0  --     Chemistries  Recent Labs  Lab 04/19/22 0354 04/20/22 0429  NA 140 139  K 4.4 4.1  CL 107 105  CO2 28 29  GLUCOSE 117* 91  BUN 19 20  CREATININE 1.12 1.23  CALCIUM 8.8* 8.6*   ------------------------------------------------------------------------------------------------------------------ estimated creatinine clearance is 44.6 mL/min (by C-G formula based on SCr of 1.23 mg/dL). ------------------------------------------------------------------------------------------------------------------ No results for input(s): "HGBA1C" in the last 72 hours. ------------------------------------------------------------------------------------------------------------------ No results for input(s): "CHOL", "HDL", "LDLCALC", "TRIG", "CHOLHDL", "LDLDIRECT" in the last 72  hours. ------------------------------------------------------------------------------------------------------------------ No results for input(s): "TSH", "T4TOTAL", "T3FREE", "THYROIDAB" in the last 72 hours.  Invalid input(s): "FREET3" ------------------------------------------------------------------------------------------------------------------ No results for input(s): "VITAMINB12", "FOLATE", "FERRITIN", "TIBC", "IRON", "RETICCTPCT" in the last 72 hours.  Coagulation profile No results for input(s): "INR", "PROTIME" in the last 168 hours.  No results for input(s): "DDIMER" in the last 72 hours.  Cardiac Enzymes No results for input(s): "CKMB", "TROPONINI", "MYOGLOBIN" in the last 168 hours.  Invalid input(s): "CK" ------------------------------------------------------------------------------------------------------------------ Invalid input(s): "POCBNP"   Time Spent in minutes  [place] M.D on 04/25/2022 at 10:59 AM   Disclaimer: This note may have been dictated with  voice recognition software. Similar sounding words can inadvertently be transcribed and this note may contain transcription errors which may not have been corrected upon publication of note.    Wt Readings from Last 3 Encounters:  04/25/22 136 lb (61.7 kg)  04/20/22 131 lb 14.4 oz (59.8 kg)  03/27/22 137 lb (62.1 kg)    BP 120/76   Pulse 80   Ht 6' (1.829 m)   Wt 136 lb (61.7 kg)   SpO2 97%   BMI 18.44 kg/m   Assessment and Plan:  1. Stage 4 very severe COPD by GOLD classification ([place]) Chronic.  Fair controlled with current treatment that patient is using on a regular basis including nebulization of inhalers handheld inhalers recently completed prednisone and recently completed azithromycin.  Patient is doing well without symptomatology and has been explained that he must continue oxygen at night mask and continue his nebulizations on an as-needed basis, and months go to his pulmonary consult  in the next week.  2. 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[place]) Followed by pulmonary and this will be evaluated on Monday, June 12.  3. Hyponatremia Patient was noted to have hyponatremia and we will follow-up with renal function panel. - Renal Function Panel  4. Macrocytic anemia Patient has a history of macrocytic anemia and we will check B12 and CBC for current status. - B12 and Folate Panel - CBC with Differential/Platelet chronic.

## 2022-04-26 LAB — CBC WITH DIFFERENTIAL/PLATELET
Basophils Absolute: 0 10*3/uL (ref 0.0–0.2)
Basos: 0 %
EOS (ABSOLUTE): 0 10*3/uL (ref 0.0–0.4)
Eos: 0 %
Hematocrit: 38.8 % (ref 37.5–51.0)
Hemoglobin: 13.1 g/dL (ref 13.0–17.7)
Immature Grans (Abs): 0 10*3/uL (ref 0.0–0.1)
Immature Granulocytes: 0 %
Lymphocytes Absolute: 0.6 10*3/uL — ABNORMAL LOW (ref 0.7–3.1)
Lymphs: 6 %
MCH: 32.3 pg (ref 26.6–33.0)
MCHC: 33.8 g/dL (ref 31.5–35.7)
MCV: 96 fL (ref 79–97)
Monocytes Absolute: 0.3 10*3/uL (ref 0.1–0.9)
Monocytes: 4 %
Neutrophils Absolute: 8.4 10*3/uL — ABNORMAL HIGH (ref 1.4–7.0)
Neutrophils: 90 %
Platelets: 194 10*3/uL (ref 150–450)
RBC: 4.05 x10E6/uL — ABNORMAL LOW (ref 4.14–5.80)
RDW: 12 % (ref 11.6–15.4)
WBC: 9.4 10*3/uL (ref 3.4–10.8)

## 2022-04-26 LAB — B12 AND FOLATE PANEL
Folate: 8.2 ng/mL (ref >3.0)
Vitamin B-12: 650 pg/mL (ref 232–1245)

## 2022-04-26 LAB — RENAL FUNCTION PANEL
Albumin: 4.2 g/dL (ref 3.7–4.7)
BUN/Creatinine Ratio: 15 (ref 10–24)
BUN: 17 mg/dL (ref 8–27)
CO2: 27 mmol/L (ref 20–29)
Calcium: 9.4 mg/dL (ref 8.6–10.2)
Chloride: 102 mmol/L (ref 96–106)
Creatinine, Ser: 1.16 mg/dL (ref 0.76–1.27)
Glucose: 122 mg/dL — ABNORMAL HIGH (ref 70–99)
Phosphorus: 2.9 mg/dL (ref 2.8–4.1)
Potassium: 4.5 mmol/L (ref 3.5–5.2)
Sodium: 144 mmol/L (ref 134–144)
eGFR: 65 mL/min/{1.73_m2} (ref >59)

## 2022-04-28 DIAGNOSIS — J449 Chronic obstructive pulmonary disease, unspecified: Secondary | ICD-10-CM | POA: Diagnosis not present

## 2022-04-28 DIAGNOSIS — R0602 Shortness of breath: Secondary | ICD-10-CM | POA: Diagnosis not present

## 2022-04-28 DIAGNOSIS — Z9981 Dependence on supplemental oxygen: Secondary | ICD-10-CM | POA: Diagnosis not present

## 2022-05-12 ENCOUNTER — Telehealth: Payer: Self-pay | Admitting: Family Medicine

## 2022-05-12 ENCOUNTER — Telehealth: Payer: Self-pay

## 2022-05-13 ENCOUNTER — Telehealth: Payer: Self-pay

## 2022-05-13 ENCOUNTER — Telehealth: Payer: Self-pay | Admitting: Family Medicine

## 2022-05-15 ENCOUNTER — Ambulatory Visit: Payer: Medicare HMO | Admitting: Family Medicine

## 2022-05-15 NOTE — TOC Progression Note (Signed)
Transition of Care [facility]) - Progression Note    Patient Details  Name: Nathaniel Garcia MRN: [id number] Date of Birth: 10-02-1946  Transition of Care [facility]) CM/SW Contact  Leone Haven, RN Phone Number: 05/15/2022, 11:55 AM  Clinical Narrative:    NCM received note to call this patient .  NCM spoke with patient and he states he needs someone to come to clean his house for him.  NCM informed him that for [facility] services he will need to go thru his PCP office, or he can hire somone privately to do that.  Informed him to go to Medicare. Gov or go [employer] duty and he should be able to find a list from there.  He states he understands.         Expected Discharge Plan and Services           Expected Discharge Date: 04/20/22                                     Social Determinants of Health (SDOH) Interventions    Readmission Risk Interventions    11/01/2021   11:51 AM  Readmission Risk Prevention Plan  Transportation Screening Complete  PCP or Specialist Appt within 5-7 Days Complete  Home Care Screening Complete  Medication Review (RN CM) Complete

## 2022-05-16 ENCOUNTER — Other Ambulatory Visit: Payer: Self-pay

## 2022-05-16 ENCOUNTER — Emergency Department (HOSPITAL_COMMUNITY): Payer: Medicare HMO

## 2022-05-16 ENCOUNTER — Observation Stay (HOSPITAL_COMMUNITY)
Admission: EM | Admit: 2022-05-16 | Discharge: 2022-05-17 | Disposition: A | Discharge status: Home / Self Care | Payer: Medicare HMO | Attending: Internal Medicine | Admitting: Internal Medicine

## 2022-05-16 DIAGNOSIS — R062 Wheezing: Secondary | ICD-10-CM | POA: Diagnosis not present

## 2022-05-16 DIAGNOSIS — Z7952 Long term (current) use of systemic steroids: Secondary | ICD-10-CM | POA: Insufficient documentation

## 2022-05-16 DIAGNOSIS — I2510 Atherosclerotic heart disease of native coronary artery without angina pectoris: Secondary | ICD-10-CM | POA: Diagnosis not present

## 2022-05-16 DIAGNOSIS — I10 Essential (primary) hypertension: Secondary | ICD-10-CM | POA: Diagnosis not present

## 2022-05-16 DIAGNOSIS — J9601 Acute respiratory failure with hypoxia: Secondary | ICD-10-CM | POA: Insufficient documentation

## 2022-05-16 DIAGNOSIS — Z87891 Personal history of nicotine dependence: Secondary | ICD-10-CM | POA: Diagnosis not present

## 2022-05-16 DIAGNOSIS — Z20822 Contact with and (suspected) exposure to covid-19: Secondary | ICD-10-CM | POA: Insufficient documentation

## 2022-05-16 DIAGNOSIS — J441 Chronic obstructive pulmonary disease with (acute) exacerbation: Secondary | ICD-10-CM | POA: Diagnosis not present

## 2022-05-16 DIAGNOSIS — N400 Enlarged prostate without lower urinary tract symptoms: Secondary | ICD-10-CM | POA: Diagnosis not present

## 2022-05-16 DIAGNOSIS — Z951 Presence of aortocoronary bypass graft: Secondary | ICD-10-CM | POA: Insufficient documentation

## 2022-05-16 DIAGNOSIS — Z743 Need for continuous supervision: Secondary | ICD-10-CM | POA: Diagnosis not present

## 2022-05-16 DIAGNOSIS — N401 Enlarged prostate with lower urinary tract symptoms: Secondary | ICD-10-CM

## 2022-05-16 DIAGNOSIS — E0965 Drug or chemical induced diabetes mellitus with hyperglycemia: Secondary | ICD-10-CM | POA: Insufficient documentation

## 2022-05-16 DIAGNOSIS — Z79899 Other long term (current) drug therapy: Secondary | ICD-10-CM | POA: Insufficient documentation

## 2022-05-16 DIAGNOSIS — J439 Emphysema, unspecified: Secondary | ICD-10-CM | POA: Diagnosis not present

## 2022-05-16 DIAGNOSIS — Z8673 Personal history of transient ischemic attack (TIA), and cerebral infarction without residual deficits: Secondary | ICD-10-CM | POA: Insufficient documentation

## 2022-05-16 DIAGNOSIS — R600 Localized edema: Secondary | ICD-10-CM | POA: Insufficient documentation

## 2022-05-16 DIAGNOSIS — D539 Nutritional anemia, unspecified: Secondary | ICD-10-CM | POA: Diagnosis not present

## 2022-05-16 DIAGNOSIS — R0689 Other abnormalities of breathing: Secondary | ICD-10-CM | POA: Diagnosis not present

## 2022-05-16 DIAGNOSIS — Z7902 Long term (current) use of antithrombotics/antiplatelets: Secondary | ICD-10-CM | POA: Insufficient documentation

## 2022-05-16 DIAGNOSIS — J9611 Chronic respiratory failure with hypoxia: Secondary | ICD-10-CM

## 2022-05-16 DIAGNOSIS — R4702 Dysphasia: Secondary | ICD-10-CM | POA: Diagnosis not present

## 2022-05-16 DIAGNOSIS — R0602 Shortness of breath: Secondary | ICD-10-CM | POA: Diagnosis not present

## 2022-05-16 DIAGNOSIS — R0902 Hypoxemia: Secondary | ICD-10-CM | POA: Diagnosis not present

## 2022-05-16 LAB — HIV ANTIBODY (ROUTINE TESTING W REFLEX): HIV Screen 4th Generation wRfx: NONREACTIVE

## 2022-05-16 LAB — COMPREHENSIVE METABOLIC PANEL
ALT: 17 U/L (ref 0–44)
AST: 17 U/L (ref 15–41)
Albumin: 3.3 g/dL — ABNORMAL LOW (ref 3.5–5.0)
Alkaline Phosphatase: 51 U/L (ref 38–126)
Anion gap: 7 (ref 5–15)
BUN: 10 mg/dL (ref 8–23)
CO2: 25 mmol/L (ref 22–32)
Calcium: 8.1 mg/dL — ABNORMAL LOW (ref 8.9–10.3)
Chloride: 110 mmol/L (ref 98–111)
Creatinine, Ser: 1.17 mg/dL (ref 0.61–1.24)
GFR, Estimated: >60 mL/min (ref >60)
Glucose, Bld: 174 mg/dL — ABNORMAL HIGH (ref 70–99)
Potassium: 4.4 mmol/L (ref 3.5–5.1)
Sodium: 142 mmol/L (ref 135–145)
Total Bilirubin: 0.4 mg/dL (ref 0.3–1.2)
Total Protein: 5.9 g/dL — ABNORMAL LOW (ref 6.5–8.1)

## 2022-05-16 LAB — CBC
HCT: 38.8 % — ABNORMAL LOW (ref 39.0–52.0)
Hemoglobin: 12.3 g/dL — ABNORMAL LOW (ref 13.0–17.0)
MCH: 33.2 pg (ref 26.0–34.0)
MCHC: 31.7 g/dL (ref 30.0–36.0)
MCV: 104.9 fL — ABNORMAL HIGH (ref 80.0–100.0)
Platelets: 194 10*3/uL (ref 150–400)
RBC: 3.7 MIL/uL — ABNORMAL LOW (ref 4.22–5.81)
RDW: 13.2 % (ref 11.5–15.5)
WBC: 8.4 10*3/uL (ref 4.0–10.5)
nRBC: 0 % (ref 0.0–0.2)

## 2022-05-16 LAB — I-STAT VENOUS BLOOD GAS, ED
Acid-Base Excess: 0 mmol/L (ref 0.0–2.0)
Bicarbonate: 28.3 mmol/L — ABNORMAL HIGH (ref 20.0–28.0)
Calcium, Ion: 1.15 mmol/L (ref 1.15–1.40)
HCT: 36 % — ABNORMAL LOW (ref 39.0–52.0)
Hemoglobin: 12.2 g/dL — ABNORMAL LOW (ref 13.0–17.0)
O2 Saturation: 85 %
Potassium: 4.1 mmol/L (ref 3.5–5.1)
Sodium: 142 mmol/L (ref 135–145)
TCO2: 30 mmol/L (ref 22–32)
pCO2, Ven: 61.9 mmHg — ABNORMAL HIGH (ref 44–60)
pH, Ven: 7.267 (ref 7.25–7.43)
pO2, Ven: 58 mmHg — ABNORMAL HIGH (ref 32–45)

## 2022-05-16 LAB — PROTIME-INR
INR: 0.9 (ref 0.8–1.2)
Prothrombin Time: 12.3 seconds (ref 11.4–15.2)

## 2022-05-16 LAB — TROPONIN I (HIGH SENSITIVITY)
Troponin I (High Sensitivity): 7 ng/L (ref <18)
Troponin I (High Sensitivity): 20 ng/L — ABNORMAL HIGH (ref <18)

## 2022-05-16 LAB — SARS CORONAVIRUS 2 BY RT PCR: SARS Coronavirus 2 by RT PCR: NEGATIVE

## 2022-05-16 LAB — BRAIN NATRIURETIC PEPTIDE: B Natriuretic Peptide: 81.3 pg/mL (ref 0.0–100.0)

## 2022-05-16 LAB — MAGNESIUM: Magnesium: 2.4 mg/dL (ref 1.7–2.4)

## 2022-05-16 LAB — PHOSPHORUS: Phosphorus: 1.7 mg/dL — ABNORMAL LOW (ref 2.5–4.6)

## 2022-05-16 MED ORDER — PREDNISONE 5 MG PO TABS
5.0000 mg | ORAL_TABLET | Freq: Every day | ORAL | Status: DC
  Administered 2022-05-16 – 2022-05-17 (×2): 5 mg via ORAL
  Filled 2022-05-16 (×2): qty 1

## 2022-05-16 MED ORDER — MONTELUKAST SODIUM 10 MG PO TABS
10.0000 mg | ORAL_TABLET | Freq: Every day | ORAL | Status: DC
Start: 2022-05-16 — End: 2022-05-17
  Administered 2022-05-16: 10 mg via ORAL
  Filled 2022-05-16: qty 1

## 2022-05-16 MED ORDER — SERTRALINE HCL 50 MG PO TABS
25.0000 mg | ORAL_TABLET | Freq: Every day | ORAL | Status: DC
  Administered 2022-05-16: 25 mg via ORAL
  Filled 2022-05-16: qty 1

## 2022-05-16 MED ORDER — IPRATROPIUM-ALBUTEROL 0.5-2.5 (3) MG/3ML IN SOLN
3.0000 mL | Freq: Once | RESPIRATORY_TRACT | Status: AC
  Administered 2022-05-16: 3 mL via RESPIRATORY_TRACT
  Filled 2022-05-16: qty 6

## 2022-05-16 MED ORDER — ENSURE ENLIVE PO LIQD
237.0000 mL | Freq: Three times a day (TID) | ORAL | Status: DC
Start: 2022-05-16 — End: 2022-05-17
  Administered 2022-05-16 – 2022-05-17 (×2): 237 mL via ORAL

## 2022-05-16 MED ORDER — UMECLIDINIUM-VILANTEROL 62.5-25 MCG/ACT IN AEPB
1.0000 | INHALATION_SPRAY | Freq: Every day | RESPIRATORY_TRACT | Status: DC
Start: 2022-05-16 — End: 2022-05-17
  Administered 2022-05-17: 1 via RESPIRATORY_TRACT
  Filled 2022-05-16: qty 14

## 2022-05-16 MED ORDER — ALBUTEROL SULFATE (2.5 MG/3ML) 0.083% IN NEBU
2.5000 mg | INHALATION_SOLUTION | RESPIRATORY_TRACT | Status: DC | PRN
  Administered 2022-05-16: 2.5 mg via RESPIRATORY_TRACT
  Filled 2022-05-16: qty 3

## 2022-05-16 MED ORDER — BUDESONIDE 0.5 MG/2ML IN SUSP
2.0000 mg | Freq: Two times a day (BID) | RESPIRATORY_TRACT | Status: DC
  Administered 2022-05-16: 2 mg via RESPIRATORY_TRACT
  Administered 2022-05-17: 0.5 mg via RESPIRATORY_TRACT
  Filled 2022-05-16 (×2): qty 8

## 2022-05-16 MED ORDER — IPRATROPIUM-ALBUTEROL 0.5-2.5 (3) MG/3ML IN SOLN
3.0000 mL | Freq: Once | RESPIRATORY_TRACT | Status: AC
  Administered 2022-05-16: 3 mL via RESPIRATORY_TRACT
  Filled 2022-05-16: qty 3

## 2022-05-16 MED ORDER — ATORVASTATIN CALCIUM 80 MG PO TABS
80.0000 mg | ORAL_TABLET | Freq: Every day | ORAL | Status: DC
  Administered 2022-05-16 – 2022-05-17 (×2): 80 mg via ORAL
  Filled 2022-05-16 (×2): qty 1

## 2022-05-16 MED ORDER — ENOXAPARIN SODIUM 40 MG/0.4ML IJ SOSY
40.0000 mg | PREFILLED_SYRINGE | INTRAMUSCULAR | Status: DC
Start: 2022-05-16 — End: 2022-05-17
  Administered 2022-05-16 – 2022-05-17 (×2): 40 mg via SUBCUTANEOUS
  Filled 2022-05-16 (×2): qty 0.4

## 2022-05-16 MED ORDER — CLOPIDOGREL BISULFATE 75 MG PO TABS
75.0000 mg | ORAL_TABLET | Freq: Every day | ORAL | Status: DC
  Administered 2022-05-16 – 2022-05-17 (×2): 75 mg via ORAL
  Filled 2022-05-16 (×2): qty 1

## 2022-05-16 MED ORDER — ALBUTEROL SULFATE (2.5 MG/3ML) 0.083% IN NEBU
INHALATION_SOLUTION | RESPIRATORY_TRACT | Status: AC
  Administered 2022-05-16: 5 mg
  Filled 2022-05-16: qty 6

## 2022-05-16 MED ORDER — POLYETHYLENE GLYCOL 3350 17 G PO PACK: 17.0000 g | PACK | Freq: Every day | ORAL | Status: DC | PRN

## 2022-05-16 MED ORDER — ACETAMINOPHEN 325 MG PO TABS: 650.0000 mg | ORAL_TABLET | Freq: Four times a day (QID) | ORAL | Status: DC | PRN

## 2022-05-16 MED ORDER — ACETAMINOPHEN 650 MG RE SUPP: 650.0000 mg | Freq: Four times a day (QID) | RECTAL | Status: DC | PRN

## 2022-05-16 MED ORDER — TAMSULOSIN HCL 0.4 MG PO CAPS
0.4000 mg | ORAL_CAPSULE | Freq: Every day | ORAL | Status: DC
  Administered 2022-05-16 – 2022-05-17 (×2): 0.4 mg via ORAL
  Filled 2022-05-16 (×2): qty 1

## 2022-05-16 NOTE — Hospital Course (Signed)
COPD:  Hypoxic respiratory failure  Feeling SOB is resolved. Resting comfortably on 2L. Is out of albuterol at home feels significant rescue relief of breathing with same. Unable to identify any triggers of his COPD. Sometimes he feels too full and feels like that makes it hard to breath. No history of GERD. Drinks a beer nightly with dinner and following same he feels like his stomach is going to pop. Leg swelling will try compression stockings. Nml BNP  Reports SOB with daily living activities.   Pulmonary rehab referral Becca placed in d/c orders let me know if that's not correct :) Dr Yetta Barre PCP f/u in the next week  Sister and brother local  Sister Lamonte Sakai [phone] coming between 1-2pm

## 2022-05-16 NOTE — Progress Notes (Signed)
Initial Nutrition Assessment  DOCUMENTATION CODES:   Underweight, Severe malnutrition in context of chronic illness  INTERVENTION:   Ensure Enlive po TID, each supplement provides 350 kcal and 20 grams of protein.    NUTRITION DIAGNOSIS:   Severe Malnutrition related to chronic illness (COPD) as evidenced by severe muscle depletion, severe fat depletion.  GOAL:   Patient will meet greater than or equal to 90% of their needs  MONITOR:   PO intake  REASON FOR ASSESSMENT:   Consult Assessment of nutrition requirement/status  ASSESSMENT:   Pt with PMH of CAD, CVA, chronic respiratory failure due to COPD on 3L [place] admitted with acute on chronic hypoxic respiratory failure 2/2 to mild-moderate COPD exacerbation.   Pt reports that he lives alone and cooks his own food or gets take out. Reports he has always been small but has lost weight in the past. He feels recently he gained weight due to a better appetite. He likes ensure but does not drink them at home.   Medications reviewed and include: prednisone  Labs reviewed: PO4: 1.7    NUTRITION - FOCUSED PHYSICAL EXAM:  Flowsheet Row Most Recent Value  Orbital Region Moderate depletion  Upper Arm Region Severe depletion  Thoracic and Lumbar Region Severe depletion  Buccal Region Severe depletion  Temple Region Moderate depletion  Clavicle Bone Region Severe depletion  Clavicle and Acromion Bone Region Severe depletion  Scapular Bone Region Severe depletion  Dorsal Hand Severe depletion  Patellar Region Severe depletion  Anterior Thigh Region Severe depletion  Posterior Calf Region Severe depletion  Edema (RD Assessment) None  Hair Reviewed  Eyes Reviewed  Mouth Reviewed  Skin Reviewed  Nails Reviewed       Diet Order:    Diet Order             Diet regular Room service appropriate? Yes; Fluid consistency: Thin  Diet effective now                   EDUCATION NEEDS:   Education needs have been  addressed  Skin:  Skin Assessment: Reviewed RN Assessment  Last BM:  unknown  Height:   Ht Readings from Last 1 Encounters:  05/16/22 6\' 1"  (1.854 m)    Weight:   Wt Readings from Last 1 Encounters:  05/16/22 61.9 kg    BMI:  Body mass index is 18 kg/m.  Estimated Nutritional Needs:   Kcal:  1900-2100  Protein:  95-115 grams  Fluid:  >1.9 L/day  05/18/22., RD, LDN, CNSC See AMiON for contact information

## 2022-05-16 NOTE — ED Provider Notes (Signed)
[facility] Emergency Department Provider Note MRN:  [id number]  Arrival date & time: 05/17/22     Chief Complaint   Respiratory distress History of Present Illness   Nathaniel Garcia is a 76 y.o. year-old male with a history of COPD presenting to the ED with chief complaint of respiratory distress.  Acute shortness of breath and wheezing over the past 1 to 2 hours, reportedly 80% on room air, and respiratory distress, arriving on CPAP.  Review of Systems  A thorough review of systems was obtained and all systems are negative except as noted in the HPI and PMH.   Patient's Health History    Past Medical History:  Diagnosis Date   COPD (chronic obstructive pulmonary disease) ([place])    Hematuria    Hyperlipidemia    Stroke [facility])     Past Surgical History:  Procedure Laterality Date   COLONOSCOPY  2011   normal- MD docs    Family History  Problem Relation Age of Onset   Prostate cancer Neg Hx     Social History   Socioeconomic History   Marital status: Legally Separated    Spouse name: Not on file   Number of children: 3   Years of education: some college   Highest education level: 12th grade  Occupational History   Occupation: Retired  Tobacco Use   Smoking status: Former    Packs/day: 0.02    Years: 60.00    Total pack years: 1.20    Types: Cigarettes    Quit date: 10/26/2020    Years since quitting: 1.5    Passive exposure: Past   Smokeless tobacco: Never   Tobacco comments:    [place] smoking cessation information provided  Vaping Use   Vaping Use: Never used  Substance and Sexual Activity   Alcohol use: Yes    Alcohol/week: 2.0 standard drinks of alcohol    Types: 2 Cans of beer per week    Comment: weekly   Drug use: Not Currently    Comment: marijuana   Sexual activity: Not Currently  Other Topics Concern   Not on file  Social History Narrative   Pt lives alone.   Social Determinants of Health   Financial  Resource Strain: Low Risk  (07/01/2021)   Overall Financial Resource Strain (CARDIA)    Difficulty of Paying Living Expenses: Not hard at all  Food Insecurity: No Food Insecurity (07/01/2021)   Hunger Vital Sign    Worried About Running Out of Food in the Last Year: Never true    Ran Out of Food in the Last Year: Never true  Transportation Needs: No Transportation Needs (07/01/2021)   PRAPARE - Hydrologist (Medical): No    Lack of Transportation (Non-Medical): No  Physical Activity: Insufficiently Active (07/01/2021)   Exercise Vital Sign    Days of Exercise per Week: 4 days    Minutes of Exercise per Session: 10 min  Stress: No Stress Concern Present (07/01/2021)   [place]    Feeling of Stress : Only a little  Social Connections: Socially Isolated (07/01/2021)   Social Connection and Isolation Panel [NHANES]    Frequency of Communication with Friends and Family: More than three times a week    Frequency of Social Gatherings with Friends and Family: Three times a week    Attends Religious Services: Never    Active Member of Clubs or Organizations: No  Attends Banker Meetings: Never    Marital Status: Separated  Intimate Partner Violence: Not At Risk (07/01/2021)   Humiliation, Afraid, Rape, and Kick questionnaire    Fear of Current or Ex-Partner: No    Emotionally Abused: No    Physically Abused: No    Sexually Abused: No     Physical Exam   Vitals:   05/17/22 0107 05/17/22 0354  BP: (!) 148/47 (!) 125/47  Pulse: 72 68  Resp: 16 18  Temp: 98.1 F (36.7 C) 98.1 F (36.7 C)  SpO2: 100% 100%    CONSTITUTIONAL: Ill-appearing, in significant respiratory distress NEURO/PSYCH:  Alert and oriented x 3, no focal deficits EYES:  eyes equal and reactive ENT/NECK:  no LAD, no JVD CARDIO: Regular rate, well-perfused, normal S1 and S2 PULM: Wheezing, poor air movement,  tachypnea, accessory muscle use GI/GU:  non-distended, non-tender MSK/SPINE:  No gross deformities, no edema SKIN:  no rash, atraumatic   *Additional and/or pertinent findings included in MDM below  Diagnostic and Interventional Summary    EKG Interpretation  Date/Time:  Friday May 16 2022 04:54:12 EDT Ventricular Rate:  93 PR Interval:  122 QRS Duration: 91 QT Interval:  351 QTC Calculation: 437 R Axis:   86 Text Interpretation: Sinus rhythm Borderline right axis deviation ST depr, consider ischemia, inferior leads Confirmed by Kennis Carina [id number]) on 05/16/2022 5:34:52 AM       Labs Reviewed  CBC - Abnormal; Notable for the following components:      Result Value   RBC 3.70 (*)    Hemoglobin 12.3 (*)    HCT 38.8 (*)    MCV 104.9 (*)    All other components within normal limits  COMPREHENSIVE METABOLIC PANEL - Abnormal; Notable for the following components:   Glucose, Bld 174 (*)    Calcium 8.1 (*)    Total Protein 5.9 (*)    Albumin 3.3 (*)    All other components within normal limits  PHOSPHORUS - Abnormal; Notable for the following components:   Phosphorus 1.7 (*)    All other components within normal limits  HEMOGLOBIN A1C - Abnormal; Notable for the following components:   Hgb A1c MFr Bld 5.7 (*)    All other components within normal limits  BASIC METABOLIC PANEL - Abnormal; Notable for the following components:   Glucose, Bld 102 (*)    Calcium 8.3 (*)    Anion gap 4 (*)    All other components within normal limits  CBC - Abnormal; Notable for the following components:   RBC 3.04 (*)    Hemoglobin 9.9 (*)    HCT 30.9 (*)    MCV 101.6 (*)    All other components within normal limits  I-STAT VENOUS BLOOD GAS, ED - Abnormal; Notable for the following components:   pCO2, Ven 61.9 (*)    pO2, Ven 58 (*)    Bicarbonate 28.3 (*)    HCT 36.0 (*)    Hemoglobin 12.2 (*)    All other components within normal limits  TROPONIN I (HIGH SENSITIVITY) - Abnormal;  Notable for the following components:   Troponin I (High Sensitivity) 20 (*)    All other components within normal limits  SARS CORONAVIRUS 2 BY RT PCR  BRAIN NATRIURETIC PEPTIDE  PROTIME-INR  MAGNESIUM  HIV ANTIBODY (ROUTINE TESTING W REFLEX)  GLUCOSE, CAPILLARY  CBC  TROPONIN I (HIGH SENSITIVITY)    DG Chest Port 1 View  Final Result  Medications  atorvastatin (LIPITOR) tablet 80 mg (80 mg Oral Given 05/16/22 1350)  tamsulosin (FLOMAX) capsule 0.4 mg (0.4 mg Oral Given 05/16/22 1315)  clopidogrel (PLAVIX) tablet 75 mg (75 mg Oral Given 05/16/22 1315)  montelukast (SINGULAIR) tablet 10 mg (10 mg Oral Given 05/16/22 2106)  enoxaparin (LOVENOX) injection 40 mg (40 mg Subcutaneous Given 05/16/22 1039)  acetaminophen (TYLENOL) tablet 650 mg (has no administration in time range)    Or  acetaminophen (TYLENOL) suppository 650 mg (has no administration in time range)  polyethylene glycol (MIRALAX / GLYCOLAX) packet 17 g (has no administration in time range)  umeclidinium-vilanterol (ANORO ELLIPTA) 62.5-25 MCG/ACT 1 puff (1 puff Inhalation Not Given 05/16/22 1117)  budesonide (PULMICORT) nebulizer solution 2 mg (2 mg Nebulization Given 05/16/22 1932)  albuterol (PROVENTIL) (2.5 MG/3ML) 0.083% nebulizer solution 2.5 mg (2.5 mg Nebulization Given 05/16/22 1039)  predniSONE (DELTASONE) tablet 5 mg (5 mg Oral Given 05/16/22 1038)  sertraline (ZOLOFT) tablet 25 mg (25 mg Oral Given 05/16/22 2106)  feeding supplement (ENSURE ENLIVE / ENSURE PLUS) liquid 237 mL (237 mLs Oral Given 05/16/22 2000)  melatonin tablet 3 mg (3 mg Oral Given 05/17/22 0123)  ipratropium-albuterol (DUONEB) 0.5-2.5 (3) MG/3ML nebulizer solution 3 mL (3 mLs Nebulization Given 05/16/22 0523)  ipratropium-albuterol (DUONEB) 0.5-2.5 (3) MG/3ML nebulizer solution 3 mL (3 mLs Nebulization Given 05/16/22 0515)  albuterol (PROVENTIL) (2.5 MG/3ML) 0.083% nebulizer solution (5 mg  Given 05/16/22 0615)     Procedures  /  Critical  Care .Critical Care  Performed by: Maudie Flakes, MD Authorized by: Maudie Flakes, MD   Critical care provider statement:    Critical care time (minutes):  44   Critical care was necessary to treat or prevent imminent or life-threatening deterioration of the following conditions:  Respiratory failure   Critical care was time spent personally by me on the following activities:  Development of treatment plan with patient or surrogate, discussions with consultants, evaluation of patient's response to treatment, examination of patient, ordering and review of laboratory studies, ordering and review of radiographic studies, ordering and performing treatments and interventions, pulse oximetry, re-evaluation of patient's condition and review of old charts   ED Course and Medical Decision Making  Initial Impression and Ddx Suspect COPD exacerbation, received steroids, magnesium, nebs with EMS with mild improvement.  Continuing noninvasive positive pressure ventilation here in the emergency department, further breathing treatments, will reassess.  Other considerations include pneumothorax, PE felt to be unlikely.  Patient has small amount of swelling to the legs, will check BNP.  Past medical/surgical history that increases complexity of ED encounter:  COPD  Interpretation of Diagnostics I personally reviewed the EKG and my interpretation is as follows:  sinus rhythm  Labs overall reassuring without significant blood count or electrolyte disturbance.  Patient Reassessment and Ultimate Disposition/Management     Admitted to stepdown.  Patient management required discussion with the following services or consulting groups:  Hospitalist Service  Complexity of Problems Addressed Acute illness or injury that poses threat of life of bodily function  Additional Data Reviewed and Analyzed Further history obtained from: EMS on arrival  Additional Factors Impacting ED Encounter  Risk Consideration of hospitalization  Barth Kirks. [place]onia Small, [place] [email or web address]  Final Clinical Impressions(s) / ED Diagnoses     ICD-10-CM   1. Acute respiratory failure with hypoxia (HCC)  J96.01       ED Discharge Orders     None  Discharge Instructions Discussed with and Provided to Patient:   Discharge Instructions   None      Sabas Sous, MD 05/17/22 [id number]

## 2022-05-16 NOTE — ED Triage Notes (Signed)
Pt arrived via [facility] c/o of SOB, lung sounds were diminished and expiratory wheezing was present. 2 of mag, 2 Duo-neb, 125 solumedrol given in the field. Pt currently on CPAP . Pt originally hypertensive BP 242/86, pitting edema reported on pt feet.   Last Vitals  186/90 SpO2- 100  HR 96  RR 30

## 2022-05-16 NOTE — H&P (Signed)
Date: 05/16/2022     Patient Name:  Nathaniel Garcia MRN: [id number]  DOB: Mar 27, 1946 Age / Sex: 76 y.o., male   PCP: Nathaniel Limerick, MD         Medical Service: Internal Medicine Teaching Service       Attending Physician: Dr. Mercie Eon, MD    Intern (1st Contact):  Pager:   Resident (2nd Contact): Nathaniel Stabler, DO Pager: Nathaniel Garcia [phone]       After Hours (After 5p/  First Contact Pager: [phone]  weekends / holidays): Second Contact Pager: [phone]   SUBJECTIVE:  Chief Complaint: SHOB  History of Present Illness: Nathaniel Garcia is a functionally limited 76 y.o. male with a pertinent PMH of CAD, stroke, chronic respiratory failure 2/2 COPD (Gold E) on 3L [place], who presents to [facility] with acute onset SHOB .  Patient states that he woke up yesterday evening with nocturia.  On the way to the bathroom the patient became short of breath.  He states that this is not atypical for him.  However, the patient notes that he had run out of his albuterol rescue inhaler.  Patient states that he became anxious not having his medication and became more short of breath which precipitated his presentation to the emergency room.  He was recently admitted to [facility] for similar event roughly 3-4 weeks ago. At that time he was treated with a course of steroids and antibiotics and sent home on additional steroids, LAMA-LABA inhaler and nebulized budesonide.  He endorses a severe history of COPD which limits his function daily.  He is currently on 1 to 3 L supplemental oxygen at home.  He states that he routinely takes his SABA inhaler but inconsistently takes the nebulized budesonide.  Additionally, patient states that he just recently received the Anoro Ellipta a day or 2 ago.  He denies any significant signs or symptoms of systemic inflammation including fever, chills, myalgias.  Additionally, he denies any significant increase in sputum production.  He did however present with hypoxia which appears  common for him particularly with exertion.  It is important to note that the patient was able to ambulate with 1 L supplemental O2 and maintain stats greater than 90% prior to discharge as last hospitalization.  Patient states that he has had some new onset lower extremity swelling and bilateral lower extremity cramping.  He denies any chest pain, orthopnea, paroxysmal nocturnal dyspnea, palpitations, or presyncopal symptoms.  Patient endorses a significant tobacco use disorder history with at least a 31-pack-year history.  He had previous noncontrast CT scan showing stable subpleural nodules that were 5 mm in largest diameter and benign appearing.  Patient has since quit smoking.  In the ED, the patient was found to be in respiratory distress with accessory muscle use and hypoxic in the 80s.  Patient was given DuoNebs and placed on BiPAP with improvement of his symptoms.  Medications: No current facility-administered medications on file prior to encounter.   Current Outpatient Medications on File Prior to Encounter  Medication Sig Dispense Refill   albuterol (VENTOLIN HFA) 108 (90 Base) MCG/ACT inhaler Inhale 2 puffs into the lungs every 6 (six) hours as needed for wheezing or shortness of breath. 6.7 g 11   atorvastatin (LIPITOR) 80 MG tablet Take 1 tablet (80 mg total) by mouth daily. 90 tablet 1   benzonatate (TESSALON) 200 MG capsule Take 200 mg by mouth 3 (three) times daily as needed for cough.  budesonide (PULMICORT) 0.25 MG/2ML nebulizer solution Use 1 vial (0.25 mg total) by nebulization 2 (two) times daily. 60 mL 12   clopidogrel (PLAVIX) 75 MG tablet TAKE 1 TABLET(75 MG) BY MOUTH DAILY 90 tablet 1   ipratropium-albuterol (DUONEB) 0.5-2.5 (3) MG/3ML SOLN Use 1 vial by nebulization 4 (four) times daily. 360 mL 0   montelukast (SINGULAIR) 10 MG tablet Take 1 tablet (10 mg total) by mouth at bedtime. 90 tablet 1   mupirocin ointment (BACTROBAN) 2 % Apply 1 application. topically 2 (two)  times daily. 22 g 0   sertraline (ZOLOFT) 25 MG tablet Take 1 tablet (25 mg total) by mouth at bedtime. 90 tablet 1   tamsulosin (FLOMAX) 0.4 MG CAPS capsule TAKE 1 CAPSULE(0.4 MG) BY MOUTH DAILY (Patient taking differently: Take 0.4 mg by mouth daily. TAKE 1 CAPSULE(0.4 MG) BY MOUTH DAILY) 90 capsule 3   umeclidinium-vilanterol (ANORO ELLIPTA) 62.5-25 MCG/ACT AEPB Inhale 1 puff into the lungs daily. 30 each 2    Past Medical History: Past Medical History:  Diagnosis Date   COPD (chronic obstructive pulmonary disease) (HCC)    Hematuria    Hyperlipidemia    Stroke [facility])     Social:  Lives - [address] [place],  [place] [address] lives alone. Support - Poor, supported by -  lives alone.  Level of function: Dependent due SHOB with exertion and frailty  Occupation - retired PCP - Nathaniel Garcia C Substance use: Nonprescription/Illicit - denied.  ETOH - none Tobacco -  31 pack/years  Family History: Family History  Problem Relation Age of Onset   Prostate cancer Neg Hx     Allergies: Allergies as of 05/16/2022 - Review Complete 05/16/2022  Allergen Reaction Noted   Hctz [hydrochlorothiazide] Rash 03/15/2021    Review of Systems: A complete ROS was negative except as per HPI.   OBJECTIVE:  Physical Exam: Blood pressure (!) 120/58, pulse 74, temperature 98.9 F (37.2 C), temperature source Tympanic, resp. rate (!) 32, height 6' (1.829 m), weight 63.5 kg, SpO2 100 %. Physical Exam Constitutional:      General: He is in acute distress (minimal).     Appearance: He is underweight. He is not diaphoretic.     Interventions: Nasal cannula in place.  HENT:     Head: Normocephalic and atraumatic.     Mouth/Throat:     Mouth: Mucous membranes are dry.  Eyes:     Extraocular Movements: Extraocular movements intact.     Conjunctiva/sclera: Conjunctivae normal.  Cardiovascular:     Rate and Rhythm: Normal rate and regular rhythm.     Pulses: Normal pulses.      Heart sounds: Normal heart sounds.  Pulmonary:     Effort: Tachypnea, accessory muscle usage, prolonged expiration and respiratory distress present.     Breath sounds: Decreased air movement present. No wheezing.  Abdominal:     General: There is no distension.     Palpations: Abdomen is soft.     Tenderness: There is no abdominal tenderness.  Musculoskeletal:        General: Swelling (2+ bilateral LE pitting edema to the shins) present. Normal range of motion.     Cervical back: Normal range of motion.  Skin:    General: Skin is warm and dry.  Neurological:     Mental Status: He is oriented to person, place, and time. Mental status is at baseline.     Pertinent Labs: CBC    Component Value Date/Time  WBC 8.4 05/16/2022 0450   RBC 3.70 (L) 05/16/2022 0450   HGB 12.2 (L) 05/16/2022 0532   HGB 13.1 04/25/2022 1122   HCT 36.0 (L) 05/16/2022 0532   HCT 38.8 04/25/2022 1122   PLT 194 05/16/2022 0450   PLT 194 04/25/2022 1122   MCV 104.9 (H) 05/16/2022 0450   MCV 96 04/25/2022 1122   MCH 33.2 05/16/2022 0450   MCHC 31.7 05/16/2022 0450   RDW 13.2 05/16/2022 0450   RDW 12.0 04/25/2022 1122   LYMPHSABS 0.6 (L) 04/25/2022 1122   MONOABS 0.8 04/19/2022 0354   EOSABS 0.0 04/25/2022 1122   BASOSABS 0.0 04/25/2022 1122     CMP     Component Value Date/Time   NA 142 05/16/2022 0532   NA 144 04/25/2022 1122   K 4.1 05/16/2022 0532   CL 110 05/16/2022 0450   CO2 25 05/16/2022 0450   GLUCOSE 174 (H) 05/16/2022 0450   BUN 10 05/16/2022 0450   BUN 17 04/25/2022 1122   CREATININE 1.17 05/16/2022 0450   CALCIUM 8.1 (L) 05/16/2022 0450   PROT 5.9 (L) 05/16/2022 0450   PROT 7.4 05/30/2019 1024   ALBUMIN 3.3 (L) 05/16/2022 0450   ALBUMIN 4.2 04/25/2022 1122   AST 17 05/16/2022 0450   ALT 17 05/16/2022 0450   ALKPHOS 51 05/16/2022 0450   BILITOT 0.4 05/16/2022 0450   BILITOT 0.4 05/30/2019 1024   GFRNONAA >60 05/16/2022 0450   GFRAA 68 05/30/2019 1024   Troponin: 7 BNP:  81.3 VBG: pH of 7.267  Pertinent Imaging: DG Chest Port 1 View  Result Date: 05/16/2022 CLINICAL DATA:  Shortness of breath and wheezing. EXAM: PORTABLE CHEST 1 VIEW COMPARISON:  Portable chest 04/18/2022. FINDINGS: There is advanced emphysematous lung disease. The lungs are clear of focal infiltrates. No pleural effusion is seen. Heart size, vascular pattern and mediastinal silhouette are normal. Osteopenia. IMPRESSION: No acute radiographic chest findings.  Stable COPD chest. Electronically Signed   By: Almira Bar M.D.   On: 05/16/2022 05:17    EKG: normal EKG, normal sinus rhythm, unchanged from previous tracings  ASSESSMENT & PLAN:  Patient Summary: Kamaron Brymer is a functionally limited 76 y.o. male with a pertinent PMH of CAD, stroke, chronic respiratory failure 2/2 COPD (Gold E) on 3L [place], who presents to [facility] with acute onset SHOB and admit for acute on chronic hypoxic respiratory failure 2/2 to mild-moderate COPD exacerbation.   Assessment: Principal Problem:   COPD exacerbation (HCC)   Plan: Acute on Chronic Respiratory Failure: Mild to Moderate COPD Exacerbation: COPD (Gold E) Patient has a significant history of COPD.  Based on his symptoms and history of exacerbations, patient is likely Gold stage E.  Current home medications include baseline O2 requirement of 1 to 3 L nasal cannula, prednisone 5 mg daily, rescue albuterol inhaler, Anoro Ellipta, and budesonide nebulizers.  Patient's COPD exacerbation is mild to moderate without significant sputum production in the setting of end-stage COPD and poor exercise compliance.  This exacerbation is secondary to difficulties with his medication regimen at home.  I am concerned that patient has multiple barriers that complicate this picture including living alone/physically limited, education, and access to care.  Patient would likely benefit from pulmonary rehab once discharged from the hospital.  Additionally, patient  endorsed anxiety secondary to air hunger which may have contributed to his admission.   -Continue prednisone 5 mg daily -Continue albuterol nebulizers every 2 hours as needed -Continue Anoro Ellipta -Continue budesonide nebulizers twice  daily -Counseled patient regarding medication regimen -Supplemental O2 as needed -BiPAP as needed - PT/OT consulted -Pulmonary rehab at discharge -Consider starting low-dose benzodiazepine for air hunger  BPH: Patient states that he continues to have significant nocturia despite Flomax 0.4 mg daily.  Additional history regarding his nocturia to assess if he needs additional management of his symptoms. -Continue tamsulosin 0.4 mg daily  Steroid-induced Hyperglycemia: Chronic Steroids: Patient is on chronic steroids and presented with hyperglycemia.  We will get an A1c and start CBG monitoring during his hospitalization.  We will need to consider further screening for chronic steroids in the outpatient setting including hypertension and osteoporosis.  Additionally his chronic steroids could be contributing to his new onset lower extremity edema. - CBG monitoring  - A1c pending - Insulin therapy if indicated - Outpatient management of chronic steroid -Lower extremity compression stockings  Lower Extremity Edema:  Patient presents with new onset lower extremity edema in his bilateral lower extremities.  There is roughly 2+ pitting edema to his mid shins.  He states this is new over the last month.  He denies any signs or symptoms of heart failure however this is complicated by his end-stage COPD which could be causing cor pulmonale.  No significant heart murmurs to suggest right-sided pathology.  Difficult to assess JVD due to accessory muscle usage with respirations.  Patient does not have any previous echocardiograms on file.  Bedside point-of-care ultrasound shows hyperdynamic heart without significant IVC distention and greater than 50% respiratory  variation.  Patient does frequently get cramps in his lower extremities but denies calf pain with a negative Homans' sign.  His lower extremities are warm with 2+ peripheral pulses and no evidence of PAD. -Consider echocardiogram during this hospitalization -Recommend lower extremity compression stockings  CAD: HLD: CVA: Patient has a history of CAD and hyperlipidemia on clopidogrel 75 mg and atorvastatin 80 mg daily.  Problem list does indicate history of coronary artery bypass grafting however, the patient does not have any thoracotomy scars and personally denies history of CABG.  Patient does however have a history of stroke. -Continue atorvastatin 80 mg -Continue Plavix 75 mg -Consider repeating lipid panel in the near future.  Anxiety: - Cont sertraline 25 mg daily.   Best Practice: Diet: Regular diet IVF: none VTE: enoxaparin (LOVENOX) injection 40 mg Start: 05/16/22 0800 Code: Full Code AB: none Dispo: Observation with expected length of stay less than 2 midnights. Anticipated Discharge Location:  pending PT/OT recs Barriers to Discharge: Medical stability Family Contact: none  Signature: Lawerance Cruel, D.O.  Internal Medicine Resident, PGY-3 [facility]  Pager #: [phone] (If no response, contact on-call pager) 8:16 AM, 05/16/2022   Please contact the on-call pager after 5 pm and on weekends at [phone].

## 2022-05-16 NOTE — ED Notes (Signed)
Nathaniel Garcia sister [phone] requesting an update on the patient

## 2022-05-17 DIAGNOSIS — Z87891 Personal history of nicotine dependence: Secondary | ICD-10-CM | POA: Diagnosis not present

## 2022-05-17 DIAGNOSIS — N401 Enlarged prostate with lower urinary tract symptoms: Secondary | ICD-10-CM | POA: Diagnosis not present

## 2022-05-17 DIAGNOSIS — J9601 Acute respiratory failure with hypoxia: Secondary | ICD-10-CM | POA: Diagnosis not present

## 2022-05-17 DIAGNOSIS — E0965 Drug or chemical induced diabetes mellitus with hyperglycemia: Secondary | ICD-10-CM | POA: Diagnosis not present

## 2022-05-17 DIAGNOSIS — Z7952 Long term (current) use of systemic steroids: Secondary | ICD-10-CM | POA: Diagnosis not present

## 2022-05-17 DIAGNOSIS — R600 Localized edema: Secondary | ICD-10-CM | POA: Diagnosis not present

## 2022-05-17 DIAGNOSIS — J9611 Chronic respiratory failure with hypoxia: Secondary | ICD-10-CM | POA: Diagnosis not present

## 2022-05-17 DIAGNOSIS — J441 Chronic obstructive pulmonary disease with (acute) exacerbation: Secondary | ICD-10-CM | POA: Diagnosis not present

## 2022-05-17 LAB — CBC
HCT: 30.9 % — ABNORMAL LOW (ref 39.0–52.0)
HCT: 34.7 % — ABNORMAL LOW (ref 39.0–52.0)
Hemoglobin: 9.9 g/dL — ABNORMAL LOW (ref 13.0–17.0)
Hemoglobin: 11 g/dL — ABNORMAL LOW (ref 13.0–17.0)
MCH: 32.6 pg (ref 26.0–34.0)
MCH: 32.6 pg (ref 26.0–34.0)
MCHC: 32 g/dL (ref 30.0–36.0)
MCHC: 31.7 g/dL (ref 30.0–36.0)
MCV: 101.6 fL — ABNORMAL HIGH (ref 80.0–100.0)
MCV: 103 fL — ABNORMAL HIGH (ref 80.0–100.0)
Platelets: 166 10*3/uL (ref 150–400)
Platelets: 186 10*3/uL (ref 150–400)
RBC: 3.04 MIL/uL — ABNORMAL LOW (ref 4.22–5.81)
RBC: 3.37 MIL/uL — ABNORMAL LOW (ref 4.22–5.81)
RDW: 13.3 % (ref 11.5–15.5)
RDW: 13.4 % (ref 11.5–15.5)
WBC: 8.4 10*3/uL (ref 4.0–10.5)
WBC: 8.5 10*3/uL (ref 4.0–10.5)
nRBC: 0 % (ref 0.0–0.2)
nRBC: 0 % (ref 0.0–0.2)

## 2022-05-17 LAB — BASIC METABOLIC PANEL
Anion gap: 4 — ABNORMAL LOW (ref 5–15)
BUN: 17 mg/dL (ref 8–23)
CO2: 25 mmol/L (ref 22–32)
Calcium: 8.3 mg/dL — ABNORMAL LOW (ref 8.9–10.3)
Chloride: 110 mmol/L (ref 98–111)
Creatinine, Ser: 1.14 mg/dL (ref 0.61–1.24)
GFR, Estimated: >60 mL/min (ref >60)
Glucose, Bld: 102 mg/dL — ABNORMAL HIGH (ref 70–99)
Potassium: 4 mmol/L (ref 3.5–5.1)
Sodium: 139 mmol/L (ref 135–145)

## 2022-05-17 LAB — GLUCOSE, CAPILLARY: Glucose-Capillary: 90 mg/dL (ref 70–99)

## 2022-05-17 LAB — HEMOGLOBIN A1C
Hgb A1c MFr Bld: 5.7 % — ABNORMAL HIGH (ref 4.8–5.6)
Mean Plasma Glucose: 117 mg/dL

## 2022-05-17 MED ORDER — MELATONIN 3 MG PO TABS
3.0000 mg | ORAL_TABLET | Freq: Every evening | ORAL | Status: DC | PRN
Start: 2022-05-17 — End: 2022-05-17
  Administered 2022-05-17: 3 mg via ORAL
  Filled 2022-05-17: qty 1

## 2022-05-17 NOTE — Evaluation (Signed)
Occupational Therapy Evaluation Patient Details Name: Nathaniel Garcia MRN: [id number] DOB: [birth year] Today's Date: 05/17/2022   History of Present Illness 89 yoM with COPD stage III-IV on chronic prednisone therapy, prior CVA, HLD, chronic folliculitis, and BPH presenting with shortness of breath and COPD exacerbation.   Clinical Impression   76 yo male admitted with COPD exacerbation. Pt reports he is indep with ADLs and mobility at baseline. He is on 3L of O2 at home. Pt demonstrated ability to perform all BADLs indep and ambulate in room without assist including navigating oxygen line. Note drop in saturations to 85% during mobility and pt indep performing pursed lip breathing for recovery with SpO2 increasing to WNL without additional intervention. Pt reports his only concern is difficulty managing his home. He is unable to clean or take out trash due to his respiratory status. Provided resources in energy conservation for pt use at home. He may benefit from in home aid services or meals on wheels if he does qualify. No additional acute OT needs. OT to sign off.     Recommendations for follow up therapy are one component of a multi-disciplinary discharge planning process, led by the attending physician.  Recommendations may be updated based on patient status, additional functional criteria and insurance authorization.   Follow Up Recommendations  No OT follow up    Assistance Recommended at Discharge Set up Supervision/Assistance  Patient can return home with the following Assistance with cooking/housework    Functional Status Assessment  Patient has had a recent decline in their functional status and demonstrates the ability to make significant improvements in function in a reasonable and predictable amount of time.  Equipment Recommendations  None recommended by OT    Recommendations for Other Services       Precautions / Restrictions Precautions Precautions: Other  (comment) (desaturations with mobility, baseline O2 3L) Restrictions Weight Bearing Restrictions: No      Mobility Bed Mobility Overal bed mobility: Independent        Transfers Overall transfer level: Independent Equipment used: None         Balance Overall balance assessment: Modified Independent               ADL either performed or assessed with clinical judgement   ADL Overall ADL's : Modified independent       General ADL Comments: increased time, pursed lip breathing to recover from mobility     Vision Baseline Vision/History: 1 Wears glasses Ability to See in Adequate Light: 0 Adequate Patient Visual Report: No change from baseline Vision Assessment?: No apparent visual deficits            Pertinent Vitals/Pain Pain Assessment Pain Assessment: 0-10 Pain Score: 0-No pain     Hand Dominance Right   Extremity/Trunk Assessment Upper Extremity Assessment Upper Extremity Assessment: Overall WFL for tasks assessed           Communication Communication Communication: No difficulties   Cognition Arousal/Alertness: Awake/alert Behavior During Therapy: WFL for tasks assessed/performed Overall Cognitive Status: Within Functional Limits for tasks assessed                        Home Living Family/patient expects to be discharged to:: Private residence           Additional Comments: 3 L O2 baseline      Prior Functioning/Environment Prior Level of Function : Independent/Modified Independent;Driving       Mobility Comments: no use of AD,  denies falls ADLs Comments: SOB limiting ability to perform household management activities, reports he does still drive        OT Problem List: Cardiopulmonary status limiting activity;Decreased activity tolerance      OT Treatment/Interventions:   Education in energy conservation    OT Goals(Current goals can be found in the care plan section)    OT Frequency:  Acute OT not indicated         AM-PAC OT "6 Clicks" Daily Activity     Outcome Measure Help from another person eating meals?: None Help from another person taking care of personal grooming?: None Help from another person toileting, which includes using toliet, bedpan, or urinal?: None Help from another person bathing (including washing, rinsing, drying)?: None Help from another person to put on and taking off regular upper body clothing?: None Help from another person to put on and taking off regular lower body clothing?: None 6 Click Score: 24   End of Session Equipment Utilized During Treatment: Oxygen  Activity Tolerance: Other (comment) (SOB) Patient left: in bed  OT Visit Diagnosis: Unsteadiness on feet (R26.81)                Time: 1030-1045 OT Time Calculation (min): 15 min Charges:  OT General Charges $OT Visit: 1 Visit OT Evaluation $OT Eval Low Complexity: 1 Low    Daryl Eastern, OTR/L 05/17/2022, 10:56 AM

## 2022-05-17 NOTE — Discharge Summary (Signed)
Name: Nathaniel Garcia MRN: [id number] DOB: [birth year] 76 y.o. PCP: Duanne Limerick, MD  Date of Admission: 05/16/2022  4:53 AM Date of Discharge: 05/17/2022 Attending Physician: Mercie Eon, MD  Discharge Diagnosis: 1. Principal Problem:   COPD exacerbation (HCC)   Discharge Medications: Allergies as of 05/17/2022       Reactions   Hctz [hydrochlorothiazide] Rash        Medication List     TAKE these medications    albuterol 108 (90 Base) MCG/ACT inhaler Commonly known as: VENTOLIN HFA Inhale 2 puffs into the lungs every 6 (six) hours as needed for wheezing or shortness of breath. What changed:  when to take this reasons to take this   Anoro Ellipta 62.5-25 MCG/ACT Aepb Generic drug: umeclidinium-vilanterol Inhale 1 puff into the lungs daily.   atorvastatin 80 MG tablet Commonly known as: LIPITOR Take 1 tablet (80 mg total) by mouth daily.   budesonide 0.25 MG/2ML nebulizer solution Commonly known as: PULMICORT Use 1 vial (0.25 mg total) by nebulization 2 (two) times daily. What changed:  when to take this additional instructions   clopidogrel 75 MG tablet Commonly known as: PLAVIX TAKE 1 TABLET(75 MG) BY MOUTH DAILY What changed:  how much to take how to take this when to take this additional instructions   ipratropium-albuterol 0.5-2.5 (3) MG/3ML Soln Commonly known as: DUONEB Use 1 vial by nebulization 4 (four) times daily. What changed:  when to take this additional instructions   mometasone 0.1 % ointment Commonly known as: ELOCON Apply 1 Application topically daily. To groin area   montelukast 10 MG tablet Commonly known as: SINGULAIR Take 1 tablet (10 mg total) by mouth at bedtime.   mupirocin ointment 2 % Commonly known as: BACTROBAN Apply 1 application. topically 2 (two) times daily.   predniSONE 10 MG tablet Commonly known as: DELTASONE Take 10 mg by mouth daily.   sertraline 25 MG tablet Commonly known as:  ZOLOFT Take 1 tablet (25 mg total) by mouth at bedtime.   tamsulosin 0.4 MG Caps capsule Commonly known as: FLOMAX TAKE 1 CAPSULE(0.4 MG) BY MOUTH DAILY What changed: See the new instructions.        Disposition and follow-up:   Nathaniel Garcia was discharged from [facility] in Stable condition.  At the hospital follow up visit please address:  1.  COPD: had a mild exacerbation, treated with home regimen. Had run out of albuterol inhaler. Will send home with inhalers used here in hospital to bridge gap before he gets more from pharmacy. Please ensure he is taking inhalers appropriately at home. F/u with PCP in 1-2 weeks.  2. Macrocytic anemia: Please evaluate B12 and folate and f/u PCP appointment.  2.  Labs / imaging needed at time of follow-up: CBC  3.  Pending labs/ test needing follow-up: none  Follow-up Appointments:  Follow-up Information     Duanne Limerick, MD. Schedule an appointment as soon as possible for a visit in 1 week(s).   Specialty: Family Medicine Contact information: [address] [place] [address] [phone]                 Hospital Course by problem list: 1. COPD Exacerbation: Mild. Presented with shortness of breath without change in sputum production. He ran out of home albuterol. Placed on bipap with EMS and received home regimen while here. Symptoms significantly improved by yesterday. He has not required increasing oxygen, still on baseline of  1-3L [place]. We discussed home regimen and he notes having everything aside from albuterol. Will send home with SABA prn, LABA/LAMA, ICS, and home prednisone 5mg  daily. Placed ambulatory referral for pulmonary rehab. F/u with PCP in 1-2 weeks.  Discharge Subjective: He feels a lot better today, back to his usual baseline. He was concerned yesterday, developed SHOB and ran out of his home albuterol and thus called EMS as he was anxious. Feels better now. We  discussed using his home inhalers regularly, which he confirms understanding.   Discharge Exam:   BP (!) 117/55 (BP Location: Right Arm)   Pulse (!) 57   Temp 98.1 F (36.7 C) (Oral)   Resp 18   Ht 6\' 1"  (1.854 m)   Wt 61.6 kg   SpO2 100%   BMI 17.92 kg/m  Discharge exam:  Physical Exam Constitutional:      Appearance: Normal appearance. He is not ill-appearing.  HENT:     Mouth/Throat:     Mouth: Mucous membranes are moist.     Pharynx: Oropharynx is clear.  Eyes:     Extraocular Movements: Extraocular movements intact.     Conjunctiva/sclera: Conjunctivae normal.  Cardiovascular:     Rate and Rhythm: Normal rate and regular rhythm.     Pulses: Normal pulses.     Heart sounds: Normal heart sounds. No murmur heard.    No gallop.  Pulmonary:     Effort: Pulmonary effort is normal. No respiratory distress.     Breath sounds: Normal breath sounds. No wheezing, rhonchi or rales.  Abdominal:     General: Bowel sounds are normal. There is no distension.     Palpations: Abdomen is soft.     Tenderness: There is no abdominal tenderness.  Musculoskeletal:        General: Normal range of motion.  Skin:    General: Skin is warm and dry.  Neurological:     General: No focal deficit present.     Mental Status: He is alert and oriented to person, place, and time.  Psychiatric:        Mood and Affect: Mood normal.        Behavior: Behavior normal.      Pertinent Labs, Studies, and Procedures:     Latest Ref Rng & Units 05/17/2022    6:55 AM 05/17/2022    3:35 AM 05/16/2022    5:32 AM  CBC  WBC 4.0 - 10.5 K/uL 8.5  8.4    Hemoglobin 13.0 - 17.0 g/dL 07/18/2022  9.9  05/18/2022   Hematocrit 39.0 - 52.0 % 34.7  30.9  36.0   Platelets 150 - 400 K/uL 186  166        Latest Ref Rng & Units 05/17/2022    3:35 AM 05/16/2022    5:32 AM 05/16/2022    4:50 AM  CMP  Glucose 70 - 99 mg/dL 05/18/2022   05/18/2022   BUN 8 - 23 mg/dL 17   10   Creatinine 353 - 1.24 mg/dL 299   2.42   Sodium 6.83 - 145 mmol/L  139  142  142   Potassium 3.5 - 5.1 mmol/L 4.0  4.1  4.4   Chloride 98 - 111 mmol/L 110   110   CO2 22 - 32 mmol/L 25   25   Calcium 8.9 - 10.3 mg/dL 8.3   8.1   Total Protein 6.5 - 8.1 g/dL   5.9   Total Bilirubin 0.3 - 1.2 mg/dL  0.4   Alkaline Phos 38 - 126 U/L   51   AST 15 - 41 U/L   17   ALT 0 - 44 U/L   17    Hemoglobin A1c: 5.7%  Troponin: 7 > 20  BNP    Component Value Date/Time   BNP 81.3 05/16/2022 0450   VBG    Component Value Date/Time   PHART 7.386 08/29/2021 1318   PCO2ART 45.2 08/29/2021 1318   PO2ART 161 (H) 08/29/2021 1318   HCO3 28.3 (H) 05/16/2022 0532   TCO2 30 05/16/2022 0532   ACIDBASEDEF 2.0 10/29/2020 2327   O2SAT 85 05/16/2022 0532      Discharge Instructions: Discharge Instructions     AMB referral to pulmonary rehabilitation   Complete by: As directed    Please select a program: Pulmonary Rehabilitation   Diagnosis: (See process instructions below for COPD requirements): COPD-Gold 3: Severe   30% </= FEV1 <50% predicted Comment - COPD GOLD E   After initial evaluation and assessments completed: Virtual Based Care may be provided alone or in conjunction with Pulmonary Rehab/Respiratory Care services based on patient barriers.: Yes   Diet - low sodium heart healthy   Complete by: As directed    Discharge instructions   Complete by: As directed    Nathaniel Garcia, you came in with shortness of breath and were treated with your home inhalers. Please make sure to use your home inhalers as prescribed at home. We are sending you home with the inhalers that you used while here at the hospital so that you have time to get new ones from your pharmacy.  Please make sure to schedule a follow up visit with your primary care doctor in 1-2 weeks.   Increase activity slowly   Complete by: As directed        Signed: Merrilyn Puma, MD 05/17/2022, 2:16 PM   Pager: [phone]

## 2022-05-17 NOTE — Evaluation (Signed)
Physical Therapy Evaluation Patient Details Name: Nathaniel Garcia MRN: [id number] DOB: Mar 22, 1946 Today's Date: 05/17/2022       Clinical Impression  Pt's PT impairment list includes mild weakness and decreased activity tolerance secondary to cardiopulmonary conditions. Pt tolerates therapy well today, ambulating household distances without the need for an assistive device. Pt is tachypnic during session, but denies SOB (inaccurate waveform - difficult to accurately assess saturation). Continued therapy will provide patient education and allow the Pt an opportunity to build activity tolerance so that he can safely maintain his prior level of independence upon discharge.       05/17/22 1020  PT Visit Information  Last PT Received On 05/17/22  Assistance Needed +1  History of Present Illness 76 y.o. male presents to [facility] hospital on 05/16/2022 with acute onset SOB. Pt admitted for acute on chronic respiratory failure. PMH includes CAD, CVA, COPD.  Precautions  Precautions Fall;Other (comment)  Precaution Comments Order for pulse ox during ambulation  Restrictions  Weight Bearing Restrictions No  Home Living  Family/patient expects to be discharged to: Private residence  Living Arrangements Alone  Available Help at Discharge Other (Comment) (Pt reports he doesn't have anyone to give him consistent support, but states he doesn't really need it)  Type of Home Apartment  Home Access Level entry  Home Layout Two level;Bed/bath upstairs  Alternate Level Stairs-Number of Steps flight  Bathroom Shower/Tub Tub/shower unit  Production designer, theatre/television/film (2 wheels)  Additional Comments 3 L O2 baseline  Prior Function  Prior Level of Function  Independent/Modified Independent;Driving  Mobility Comments Pt reports that he doesn't use an AD and can ambulate household distances at baseline  Communication  Communication No difficulties  Pain Assessment  Pain  Assessment No/denies pain  Cognition  Arousal/Alertness Awake/alert  Behavior During Therapy WFL for tasks assessed/performed  Overall Cognitive Status Within Functional Limits for tasks assessed  Upper Extremity Assessment  Upper Extremity Assessment Overall WFL for tasks assessed  Lower Extremity Assessment  Lower Extremity Assessment Overall WFL for tasks assessed  Cervical / Trunk Assessment  Cervical / Trunk Assessment Normal  Bed Mobility  Overal bed mobility Independent  Transfers  Overall transfer level Independent  Equipment used None  Ambulation/Gait  Ambulation/Gait assistance Independent  Gait Distance (Feet) 150 Feet (Additional trial of 150' after seated rest break)  Assistive device None  Gait Pattern/deviations WFL(Within Functional Limits)  Gait velocity slightly decreased  Gait velocity interpretation 1.31 - 2.62 ft/sec, indicative of limited community ambulator  Balance  Overall balance assessment Independent  General Comments  General comments (skin integrity, edema, etc.) Oxygen saturation monitored with an inaccurate (poor waveform) reading during mobilization. Pt tachypnic during session but denies SOB  PT - End of Session  Equipment Utilized During Treatment Oxygen  Activity Tolerance Patient tolerated treatment well  Patient left in bed;with call bell/phone within reach  Nurse Communication Mobility status  PT Assessment  PT Recommendation/Assessment Patient needs continued PT services  PT Visit Diagnosis Other abnormalities of gait and mobility (R26.89);Muscle weakness (generalized) (M62.81)  PT Problem List Decreased strength;Decreased activity tolerance;Decreased mobility;Cardiopulmonary status limiting activity  PT Plan  PT Frequency (ACUTE ONLY) Min 3X/week  PT Treatment/Interventions (ACUTE ONLY) DME instruction;Gait training;Stair training;Functional mobility training;Therapeutic activities;Therapeutic exercise;Patient/family education  AM-PAC  PT "6 Clicks" Mobility Outcome Measure (Version 2)  Help needed turning from your back to your side while in a flat bed without using bedrails? 4  Help needed moving from lying on your  back to sitting on the side of a flat bed without using bedrails? 4  Help needed moving to and from a bed to a chair (including a wheelchair)? 4  Help needed standing up from a chair using your arms (e.g., wheelchair or bedside chair)? 4  Help needed to walk in hospital room? 4  Help needed climbing 3-5 steps with a railing?  4  6 Click Score 24  Consider Recommendation of Discharge To: Home with no services  Progressive Mobility  What is the highest level of mobility based on the progressive mobility assessment? Level 6 (Walks independently in room and hall) - Balance while walking in room without assist - Complete  Activity Ambulated independently in hallway  PT Recommendation  Follow Up Recommendations Home health PT  Assistance recommended at discharge PRN  Patient can return home with the following Other (comment) (PRN assistance)  Functional Status Assessment Patient has had a recent decline in their functional status and demonstrates the ability to make significant improvements in function in a reasonable and predictable amount of time.  PT equipment None recommended by PT  Individuals Consulted  Consulted and Agree with Results and Recommendations Patient  Acute Rehab PT Goals  Patient Stated Goal Return home  PT Goal Formulation With patient  Time For Goal Achievement 05/31/22  Potential to Achieve Goals Good  PT Time Calculation  PT Start Time (ACUTE ONLY) 0957  PT Stop Time (ACUTE ONLY) 1020  PT Time Calculation (min) (ACUTE ONLY) 23 min  PT General Charges  $$ ACUTE PT VISIT 1 Visit  PT Evaluation  $PT Eval Low Complexity 1 Low     Murlean Hark, SPT Acute Rehabilitation Office #: [phone]   Murlean Hark 05/17/2022, 2:22 PM

## 2022-05-17 NOTE — Care Management Obs Status (Signed)
MEDICARE OBSERVATION STATUS NOTIFICATION   Patient Details  Name: Nathaniel Garcia MRN: [id number] Date of Birth: 16-Jun-1946   Medicare Observation Status Notification Given:  Yes    Lawerance Sabal, RN 05/17/2022, 11:16 AM

## 2022-05-22 DIAGNOSIS — R0609 Other forms of dyspnea: Secondary | ICD-10-CM | POA: Diagnosis not present

## 2022-05-22 DIAGNOSIS — J432 Centrilobular emphysema: Secondary | ICD-10-CM | POA: Diagnosis not present

## 2022-05-22 DIAGNOSIS — Z9289 Personal history of other medical treatment: Secondary | ICD-10-CM | POA: Diagnosis not present

## 2022-05-22 DIAGNOSIS — Z9981 Dependence on supplemental oxygen: Secondary | ICD-10-CM | POA: Diagnosis not present

## 2022-05-25 DIAGNOSIS — J449 Chronic obstructive pulmonary disease, unspecified: Secondary | ICD-10-CM | POA: Diagnosis not present

## 2022-05-25 DIAGNOSIS — J439 Emphysema, unspecified: Secondary | ICD-10-CM | POA: Diagnosis not present

## 2022-05-25 DIAGNOSIS — G4733 Obstructive sleep apnea (adult) (pediatric): Secondary | ICD-10-CM | POA: Diagnosis not present

## 2022-05-25 DIAGNOSIS — J441 Chronic obstructive pulmonary disease with (acute) exacerbation: Secondary | ICD-10-CM | POA: Diagnosis not present

## 2022-06-06 ENCOUNTER — Telehealth: Payer: Self-pay | Admitting: Acute Care

## 2022-06-06 NOTE — Telephone Encounter (Signed)
Returned call to patient who requests to cancel his Sept 2023 LDCT.  He is moving to [facility] and will no longer be in the area.  CT cancelled

## 2022-06-18 ENCOUNTER — Telehealth: Payer: Self-pay | Admitting: Family Medicine

## 2022-06-18 ENCOUNTER — Other Ambulatory Visit: Payer: Self-pay | Admitting: Family Medicine

## 2022-06-18 DIAGNOSIS — I679 Cerebrovascular disease, unspecified: Secondary | ICD-10-CM

## 2022-06-18 DIAGNOSIS — I700 Atherosclerosis of aorta: Secondary | ICD-10-CM

## 2022-06-18 NOTE — Telephone Encounter (Signed)
Patient called and states she is not able to empty his bladder. He says he is only dribbling. I offered him to comer to the office today at 1:00 and he said he is not able to come in and he requested to go to [facility] tomorrow. I explained we will not have a provider in the [facility] office tomorrow. He will need to come to [place]. I told him that if he is unable to come in at 1:00 and the symptom get worse he will need to go to the ER. Patient states he will have to call back.

## 2022-06-23 ENCOUNTER — Telehealth: Payer: Self-pay

## 2022-06-23 ENCOUNTER — Telehealth: Payer: Self-pay | Admitting: Family Medicine

## 2022-06-23 NOTE — Telephone Encounter (Signed)
Copied from CRM [id number]. Topic: General - Inquiry >> Jun 23, 2022 11:36 AM De Blanch wrote: Reason for CRM: Pt stated that he is moving to [place] and is asking if there is a doctor that we can recommend and if Dr. Yetta Barre needs to see him before he leaves.  Please advise.

## 2022-06-23 NOTE — Telephone Encounter (Signed)
I called pt x 2- "mailbox is full"

## 2022-06-25 ENCOUNTER — Telehealth: Payer: Self-pay | Admitting: Family Medicine

## 2022-06-25 ENCOUNTER — Telehealth: Payer: Self-pay

## 2022-06-25 NOTE — Telephone Encounter (Signed)
Copied from CRM [id number]. Topic: General - Other >> Jun 25, 2022  8:50 AM Lyman Speller wrote: Reason for CRM: Pt is moving to [facility] on 8.23.23 / pt asked if Dr. Yetta Barre needs to see him before he leaves and he asked if Dr. Yetta Barre knows anyone to recommend as a PCP in [place] / please advise

## 2022-06-25 NOTE — Telephone Encounter (Signed)
Pt called wanting to know a PCP in [place], [place]. I was able to find [facility]. I also gave address [address] [place], [place] [address] and phone [phone] to pt. He is moving to [place], [place] to be closer to his kids.

## 2022-06-26 ENCOUNTER — Telehealth: Payer: Self-pay

## 2022-06-26 NOTE — Telephone Encounter (Signed)
Patient called stating that he is moving to [place], [place] [address] at the end of the month. He wanted to know if you knew of any Urologists in the area or could recommend anyone?

## 2022-07-02 ENCOUNTER — Ambulatory Visit (INDEPENDENT_AMBULATORY_CARE_PROVIDER_SITE_OTHER): Payer: PRIVATE HEALTH INSURANCE

## 2022-07-02 DIAGNOSIS — Z0000 Encounter for general adult medical examination without abnormal findings: Secondary | ICD-10-CM

## 2022-07-02 NOTE — Progress Notes (Signed)
I connected with  Nathaniel Garcia on 07/02/22 by a audio enabled telemedicine application and verified that I am speaking with the correct person using two identifiers.  Patient Location: Home  Provider Location: Office/Clinic  I discussed the limitations of evaluation and management by telemedicine. The patient expressed understanding and agreed to proceed.   Subjective:   Nathaniel Garcia is a 76 y.o. male who presents for Medicare Annual/Subsequent preventive examination.  Review of Systems    Per HPI unless specifically indicated below Cardiac Risk Factors include: advanced age (>29men, >31 women);male gender, dyslipidemia          Objective:       05/17/2022   11:41 AM 05/17/2022    8:06 AM 05/17/2022    3:54 AM  Vitals with BMI  Weight   135 lbs 13 oz  BMI   123456  Systolic [id number]  Diastolic 55 57 47  Pulse 57 55 68    Today's Vitals   07/02/22 1328  PainSc: 0-No pain   There is no height or weight on file to calculate BMI.     05/16/2022    5:04 AM 04/18/2022    8:00 PM 04/18/2022    8:34 AM 10/31/2021    6:00 PM 08/27/2021    2:36 AM 07/01/2021   10:39 AM 10/30/2020   10:10 PM  Advanced Directives  Does Patient Have a Medical Advance Directive? No No No Yes No No No  Type of Scientist, research (medical);Living will     Does patient want to make changes to medical advance directive?    No - Patient declined     Would patient like information on creating a medical advance directive? No - Patient declined No - Patient declined   No - Patient declined Yes (MAU/Ambulatory/Procedural Areas - Information given) Yes (Inpatient - patient requests chaplain consult to create a medical advance directive)    Current Medications (verified) Outpatient Encounter Medications as of 07/02/2022  Medication Sig   albuterol (VENTOLIN HFA) 108 (90 Base) MCG/ACT inhaler Inhale 2 puffs into the lungs every 6 (six) hours as needed for wheezing or  shortness of breath. (Patient taking differently: Inhale 2 puffs into the lungs as needed for shortness of breath.)   atorvastatin (LIPITOR) 80 MG tablet Take 1 tablet (80 mg total) by mouth daily.   budesonide (PULMICORT) 0.25 MG/2ML nebulizer solution Use 1 vial (0.25 mg total) by nebulization 2 (two) times daily. (Patient taking differently: Take 0.25 mg by nebulization in the morning and at bedtime. When able to remember)   clopidogrel (PLAVIX) 75 MG tablet TAKE 1 TABLET(75 MG) BY MOUTH DAILY   ipratropium-albuterol (DUONEB) 0.5-2.5 (3) MG/3ML SOLN Use 1 vial by nebulization 4 (four) times daily. (Patient taking differently: Take 3 mLs by nebulization in the morning and at bedtime. When able to remember)   mometasone (ELOCON) 0.1 % ointment Apply 1 Application topically daily. To groin area   montelukast (SINGULAIR) 10 MG tablet Take 1 tablet (10 mg total) by mouth at bedtime.   mupirocin ointment (BACTROBAN) 2 % Apply 1 application. topically 2 (two) times daily.   predniSONE (DELTASONE) 10 MG tablet Take 10 mg by mouth daily.   sertraline (ZOLOFT) 25 MG tablet Take 1 tablet (25 mg total) by mouth at bedtime.   tamsulosin (FLOMAX) 0.4 MG CAPS capsule TAKE 1 CAPSULE(0.4 MG) BY MOUTH DAILY (Patient taking differently: Take 0.4 mg by mouth daily. TAKE 1 CAPSULE(0.4  MG) BY MOUTH DAILY)   umeclidinium-vilanterol (ANORO ELLIPTA) 62.5-25 MCG/ACT AEPB Inhale 1 puff into the lungs daily.   ALPRAZolam (XANAX) 0.25 MG tablet Take 0.25 mg by mouth 3 (three) times daily as needed. (Patient not taking: Reported on 07/02/2022)   No facility-administered encounter medications on file as of 07/02/2022.    Allergies (verified) Hctz [hydrochlorothiazide]   History: Past Medical History:  Diagnosis Date   COPD (chronic obstructive pulmonary disease) (HCC)    Hematuria    Hyperlipidemia    Stroke [facility])    Past Surgical History:  Procedure Laterality Date   COLONOSCOPY  2011   normal- MD docs   Family  History  Problem Relation Age of Onset   Prostate cancer Neg Hx    Social History   Socioeconomic History   Marital status: Legally Separated    Spouse name: Not on file   Number of children: 3   Years of education: some college   Highest education level: 12th grade  Occupational History   Occupation: Retired  Tobacco Use   Smoking status: Former    Packs/day: 0.02    Years: 60.00    Total pack years: 1.20    Types: Cigarettes    Quit date: 10/26/2020    Years since quitting: 1.6    Passive exposure: Past   Smokeless tobacco: Never   Tobacco comments:    [place] smoking cessation information provided  Vaping Use   Vaping Use: Never used  Substance and Sexual Activity   Alcohol use: Yes    Alcohol/week: 2.0 standard drinks of alcohol    Types: 2 Cans of beer per week    Comment: weekly   Drug use: Not Currently    Comment: marijuana   Sexual activity: Not Currently  Other Topics Concern   Not on file  Social History Narrative   Pt lives alone.   Social Determinants of Health   Financial Resource Strain: Low Risk  (07/02/2022)   Overall Financial Resource Strain (CARDIA)    Difficulty of Paying Living Expenses: Not hard at all  Food Insecurity: No Food Insecurity (07/02/2022)   Hunger Vital Sign    Worried About Running Out of Food in the Last Year: Never true    Ran Out of Food in the Last Year: Never true  Transportation Needs: No Transportation Needs (07/02/2022)   PRAPARE - Administrator, Civil Service (Medical): No    Lack of Transportation (Non-Medical): No  Physical Activity: Inactive (07/02/2022)   Exercise Vital Sign    Days of Exercise per Week: 0 days    Minutes of Exercise per Session: 0 min  Stress: No Stress Concern Present (07/02/2022)   [employer] of Occupational Health - Occupational Stress Questionnaire    Feeling of Stress : Not at all  Social Connections: Socially Isolated (07/01/2021)   Social Connection and Isolation  Panel [NHANES]    Frequency of Communication with Friends and Family: More than three times a week    Frequency of Social Gatherings with Friends and Family: Three times a week    Attends Religious Services: Never    Active Member of Clubs or Organizations: No    Attends Banker Meetings: Never    Marital Status: Separated    Tobacco Counseling Counseling given: Not Answered Tobacco comments: [place] smoking cessation information provided   Clinical Intake:     Pain : 0-10 Pain Score: 0-No pain     Nutritional Status: BMI  of 19-24  Normal  How often do you need to have someone help you when you read instructions, pamphlets, or other written materials from your doctor or pharmacy?: 1 - Never  Diabetic?No   Interpreter Needed?: No  Information entered by :: Donnie Mesa, CMA   Activities of Daily Living    07/02/2022    1:30 PM 04/18/2022    7:29 PM  In your present state of health, do you have any difficulty performing the following activities:  Hearing? 0 1  Vision? 1 1  Difficulty concentrating or making decisions? 0 0  Walking or climbing stairs? 0 1  Dressing or bathing? 0 0  Doing errands, shopping? 0 0    Patient Care Team: Juline Patch, MD as PCP - General (Family Medicine) Nori Riis PA-C as Physician Assistant (Urology) Erby Pian, MD as Consulting Physician (Specialist)  Indicate any recent Medical Services you may have received from other than [facility] providers in the past year (date may be approximate).    Admitted in [facility] on 05/16/2022 for COPD exacerbation Assessment:   This is a routine wellness examination for [place].  Hearing/Vision screen Denies any hearing issues. Denies any vision is Dietary issues and exercise activities discussed:     Goals Addressed   None    Depression Screen    04/25/2022   10:23 AM 03/27/2022    9:11 AM 01/10/2022   10:16 AM 10/09/2021   10:37 AM  08/01/2021   10:00 AM 07/12/2021   10:32 AM 07/01/2021   10:36 AM  PHQ 2/9 Scores  PHQ - 2 Score 0 0 0 0 0 0 0  PHQ- 9 Score 0 0 0 0 0 2     Fall Risk    04/25/2022   10:24 AM 10/09/2021   10:37 AM 08/01/2021   10:00 AM 07/01/2021   10:41 AM 02/28/2021    3:59 PM  Fall Risk   Falls in the past year? 0 1 0 0 0  Number falls in past yr: 0 0 0 0   Injury with Fall? 0 0 0 0   Risk for fall due to : No Fall Risks History of fall(s) No Fall Risks No Fall Risks   Follow up Falls evaluation completed Falls evaluation completed Falls evaluation completed Falls prevention discussed Falls evaluation completed    [place]:  Any stairs in or around the home? Yes  If so, are there any without handrails? No  Home free of loose throw rugs in walkways, pet beds, electrical cords, etc? Yes  Adequate lighting in your home to reduce risk of falls? Yes   ASSISTIVE DEVICES UTILIZED TO PREVENT FALLS:  Life alert? No  Use of a cane, walker or w/c? No  Grab bars in the bathroom? No  Shower chair or bench in shower? No  Elevated toilet seat or a handicapped toilet? No   TIMED UP AND GO:  Was the test performed?  unable to perform, virtual appt .  Cognitive Function:        07/02/2022    1:29 PM 02/28/2021    4:14 PM 06/27/2020   10:16 AM 06/20/2019    2:24 PM 06/16/2018   11:52 AM  6CIT Screen  What Year? 0 points 0 points 0 points 0 points 0 points  What month? 0 points 0 points 0 points 0 points 0 points  What time? 0 points 0 points 0 points 0 points  0 points  Count back from 20 0 points 0 points 0 points 0 points 0 points  Months in reverse 0 points 0 points 0 points 2 points 0 points  Repeat phrase 0 points 0 points 0 points 0 points 0 points  Total Score 0 points 0 points 0 points 2 points 0 points    Immunizations Immunization History  Administered Date(s) Administered   PFIZER(Purple Top)SARS-COV-2 Vaccination 12/29/2019, 01/23/2020    TDAP  status: Due, Education has been provided regarding the importance of this vaccine. Advised may receive this vaccine at local pharmacy or Health Dept. Aware to provide a copy of the vaccination record if obtained from local pharmacy or Health Dept. Verbalized acceptance and understanding.  Flu Vaccine: up-to-date  Pneumococcal vaccine status: Declined,  Education has been provided regarding the importance of this vaccine but patient still declined. Advised may receive this vaccine at local pharmacy or Health Dept. Aware to provide a copy of the vaccination record if obtained from local pharmacy or Health Dept. Verbalized acceptance and understanding.   Covid-19 vaccine status: Declined, Education has been provided regarding the importance of this vaccine but patient still declined. Advised may receive this vaccine at local pharmacy or Health Dept.or vaccine clinic. Aware to provide a copy of the vaccination record if obtained from local pharmacy or Health Dept. Verbalized acceptance and understanding.  Qualifies for Shingles Vaccine? Yes   Zostavax completed No   Shingrix Completed?: No.    Education has been provided regarding the importance of this vaccine. Patient has been advised to call insurance company to determine out of pocket expense if they have not yet received this vaccine. Advised may also receive vaccine at local pharmacy or Health Dept. Verbalized acceptance and understanding.  Screening Tests Health Maintenance  Topic Date Due   Zoster Vaccines- Shingrix (1 of 2) Never done   INFLUENZA VACCINE  06/17/2022   Pneumonia Vaccine 53+ Years old (1 - PCV) 10/09/2022 (Originally 06/22/1952)   TETANUS/TDAP  01/31/2023 (Originally 06/21/1965)   Hepatitis C Screening  01/31/2023 (Originally 06/22/1964)   HPV VACCINES  Aged Out   COLONOSCOPY (Pts 45-11yrs Insurance coverage will need to be confirmed)  Discontinued   COVID-19 Vaccine  Discontinued   Fecal DNA (Cologuard)  Discontinued     Health Maintenance  Health Maintenance Due  Topic Date Due   Zoster Vaccines- Shingrix (1 of 2) Never done   INFLUENZA VACCINE  06/17/2022    Colorectal cancer screening: No longer required. Last Colonoscopy 2011  Lung Cancer Screening: (Low Dose CT Chest recommended if Age 29-80 years, 30 pack-year currently smoking OR have quit w/in 15years.) does not qualify.   Lung Cancer Screening Referral: does not qualify   Additional Screening:  Hepatitis C Screening: does not qualify  Vision Screening: Recommended annual ophthalmology exams for early detection of glaucoma and other disorders of the eye. Is the patient up to date with their annual eye exam?  No  Who is the provider or what is the name of the office in which the patient attends annual eye exams? [facility]  If pt is not established with a provider, would they like to be referred to a provider to establish care? No .  . The pt is relocated to [place], [place]  Dental Screening: Recommended annual dental exams for proper oral hygiene  Community Resource Referral / Chronic Care Management: CRR required this visit?  No   CCM required this visit?  No      Plan:  I have personally reviewed and noted the following in the patient's chart:   Medical and social history Use of alcohol, tobacco or illicit drugs  Current medications and supplements including opioid prescriptions. Patient is not currently taking opioid prescriptions. Functional ability and status Nutritional status Physical activity Advanced directives List of other physicians Hospitalizations, surgeries, and ER visits in previous 12 months Vitals Screenings to include cognitive, depression, and falls Referrals and appointments  In addition, I have reviewed and discussed with patient certain preventive protocols, quality metrics, and best practice recommendations. A written personalized care plan for preventive services as well as general  preventive health recommendations were provided to patient.    Mr. Brayman , Thank you for taking time to come for your Medicare Wellness Visit. I appreciate your ongoing commitment to your health goals. Please review the following plan we discussed and let me know if I can assist you in the future.   These are the goals we discussed:  Goals      DIET - INCREASE WATER INTAKE     Recommend to drink at least 6-8 8oz glasses of water per day.        This is a list of the screening recommended for you and due dates:  Health Maintenance  Topic Date Due   Zoster (Shingles) Vaccine (1 of 2) Never done   Flu Shot  06/17/2022   Pneumonia Vaccine (1 - PCV) 10/09/2022*   Tetanus Vaccine  01/31/2023*   Hepatitis C Screening: USPSTF Recommendation to screen - Ages 18-79 yo.  01/31/2023*   HPV Vaccine  Aged Out   Colon Cancer Screening  Discontinued   COVID-19 Vaccine  Discontinued   Cologuard (Stool DNA test)  Discontinued  *Topic was postponed. The date shown is not the original due date.      Lonna Cobb, CMA   07/02/2022   Nurse Notes: Approximately 40 minute Non-face-to-face visit

## 2022-07-02 NOTE — Patient Instructions (Signed)
Health Maintenance, Male Adopting a healthy lifestyle and getting preventive care are important in promoting health and wellness. Ask your health care provider about: The right schedule for you to have regular tests and exams. Things you can do on your own to prevent diseases and keep yourself healthy. What should I know about diet, weight, and exercise? Eat a healthy diet  Eat a diet that includes plenty of vegetables, fruits, low-fat dairy products, and lean protein. Do not eat a lot of foods that are high in solid fats, added sugars, or sodium. Maintain a healthy weight Body mass index (BMI) is a measurement that can be used to identify possible weight problems. It estimates body fat based on height and weight. Your health care provider can help determine your BMI and help you achieve or maintain a healthy weight. Get regular exercise Get regular exercise. This is one of the most important things you can do for your health. Most adults should: Exercise for at least 150 minutes each week. The exercise should increase your heart rate and make you sweat (moderate-intensity exercise). Do strengthening exercises at least twice a week. This is in addition to the moderate-intensity exercise. Spend less time sitting. Even light physical activity can be beneficial. Watch cholesterol and blood lipids Have your blood tested for lipids and cholesterol at 77 years of age, then have this test every 5 years. You may need to have your cholesterol levels checked more often if: Your lipid or cholesterol levels are high. You are older than 77 years of age. You are at high risk for heart disease. What should I know about cancer screening? Many types of cancers can be detected early and may often be prevented. Depending on your health history and family history, you may need to have cancer screening at various ages. This may include screening for: Colorectal cancer. Prostate cancer. Skin cancer. Lung  cancer. What should I know about heart disease, diabetes, and high blood pressure? Blood pressure and heart disease High blood pressure causes heart disease and increases the risk of stroke. This is more likely to develop in people who have high blood pressure readings or are overweight. Talk with your health care provider about your target blood pressure readings. Have your blood pressure checked: Every 3-5 years if you are 18-39 years of age. Every year if you are 40 years old or older. If you are between the ages of 65 and 75 and are a current or former smoker, ask your health care provider if you should have a one-time screening for abdominal aortic aneurysm (AAA). Diabetes Have regular diabetes screenings. This checks your fasting blood sugar level. Have the screening done: Once every three years after age 45 if you are at a normal weight and have a low risk for diabetes. More often and at a younger age if you are overweight or have a high risk for diabetes. What should I know about preventing infection? Hepatitis B If you have a higher risk for hepatitis B, you should be screened for this virus. Talk with your health care provider to find out if you are at risk for hepatitis B infection. Hepatitis C Blood testing is recommended for: Everyone born from 1945 through 1965. Anyone with known risk factors for hepatitis C. Sexually transmitted infections (STIs) You should be screened each year for STIs, including gonorrhea and chlamydia, if: You are sexually active and are younger than 77 years of age. You are older than 77 years of age and your   health care provider tells you that you are at risk for this type of infection. Your sexual activity has changed since you were last screened, and you are at increased risk for chlamydia or gonorrhea. Ask your health care provider if you are at risk. Ask your health care provider about whether you are at high risk for HIV. Your health care provider  may recommend a prescription medicine to help prevent HIV infection. If you choose to take medicine to prevent HIV, you should first get tested for HIV. You should then be tested every 3 months for as long as you are taking the medicine. Follow these instructions at home: Alcohol use Do not drink alcohol if your health care provider tells you not to drink. If you drink alcohol: Limit how much you have to 0-2 drinks a day. Know how much alcohol is in your drink. In the [place] one drink equals one 12 oz bottle of beer (355 mL), one 5 oz glass of wine (148 mL), or one 1 oz glass of hard liquor (44 mL). Lifestyle Do not use any products that contain nicotine or tobacco. These products include cigarettes, chewing tobacco, and vaping devices, such as e-cigarettes. If you need help quitting, ask your health care provider. Do not use street drugs. Do not share needles. Ask your health care provider for help if you need support or information about quitting drugs. General instructions Schedule regular health, dental, and eye exams. Stay current with your vaccines. Tell your health care provider if: You often feel depressed. You have ever been abused or do not feel safe at home. Summary Adopting a healthy lifestyle and getting preventive care are important in promoting health and wellness. Follow your health care provider's instructions about healthy diet, exercising, and getting tested or screened for diseases. Follow your health care provider's instructions on monitoring your cholesterol and blood pressure. This information is not intended to replace advice given to you by your health care provider. Make sure you discuss any questions you have with your health care provider. Document Revised: 03/25/2021 Document Reviewed: 03/25/2021 Elsevier Patient Education  2023 Elsevier Inc.  

## 2022-07-10 NOTE — Telephone Encounter (Signed)
Called patient and explained per Nathaniel Garcia that she did not know any urologists personally, but a google search shows there are many urologists in that area. Patient verbalized understanding and thanks her for her services

## 2022-07-15 ENCOUNTER — Telehealth: Payer: Self-pay

## 2022-07-15 NOTE — Telephone Encounter (Signed)
Called pt to verify he moved to [place] and then called tyler at Dr Kilpatrick's office in [place] to let them know pt has moved and will not be attending the visit he had scheduled with them.

## 2022-07-15 NOTE — Telephone Encounter (Signed)
[employer] Entry from Jill Alexanders R MD. The patient is scheduled to see them today at 3 for pulmonology.  Joselyn Glassman is asking if a referral can be sent in for the patient to be seen.    Please advise.

## 2022-08-07 ENCOUNTER — Ambulatory Visit: Payer: Medicare HMO

## 2022-08-11 ENCOUNTER — Ambulatory Visit: Payer: Medicare HMO | Admitting: Urology

## 2022-09-13 IMAGING — CR DG CHEST 2V
2 series · 2 of 2 positions shown · non-contrast
Comparison: CT 07/05/2020

CLINICAL DATA: Short of breath

EXAM:
CHEST - 2 VIEW

[chest pa]
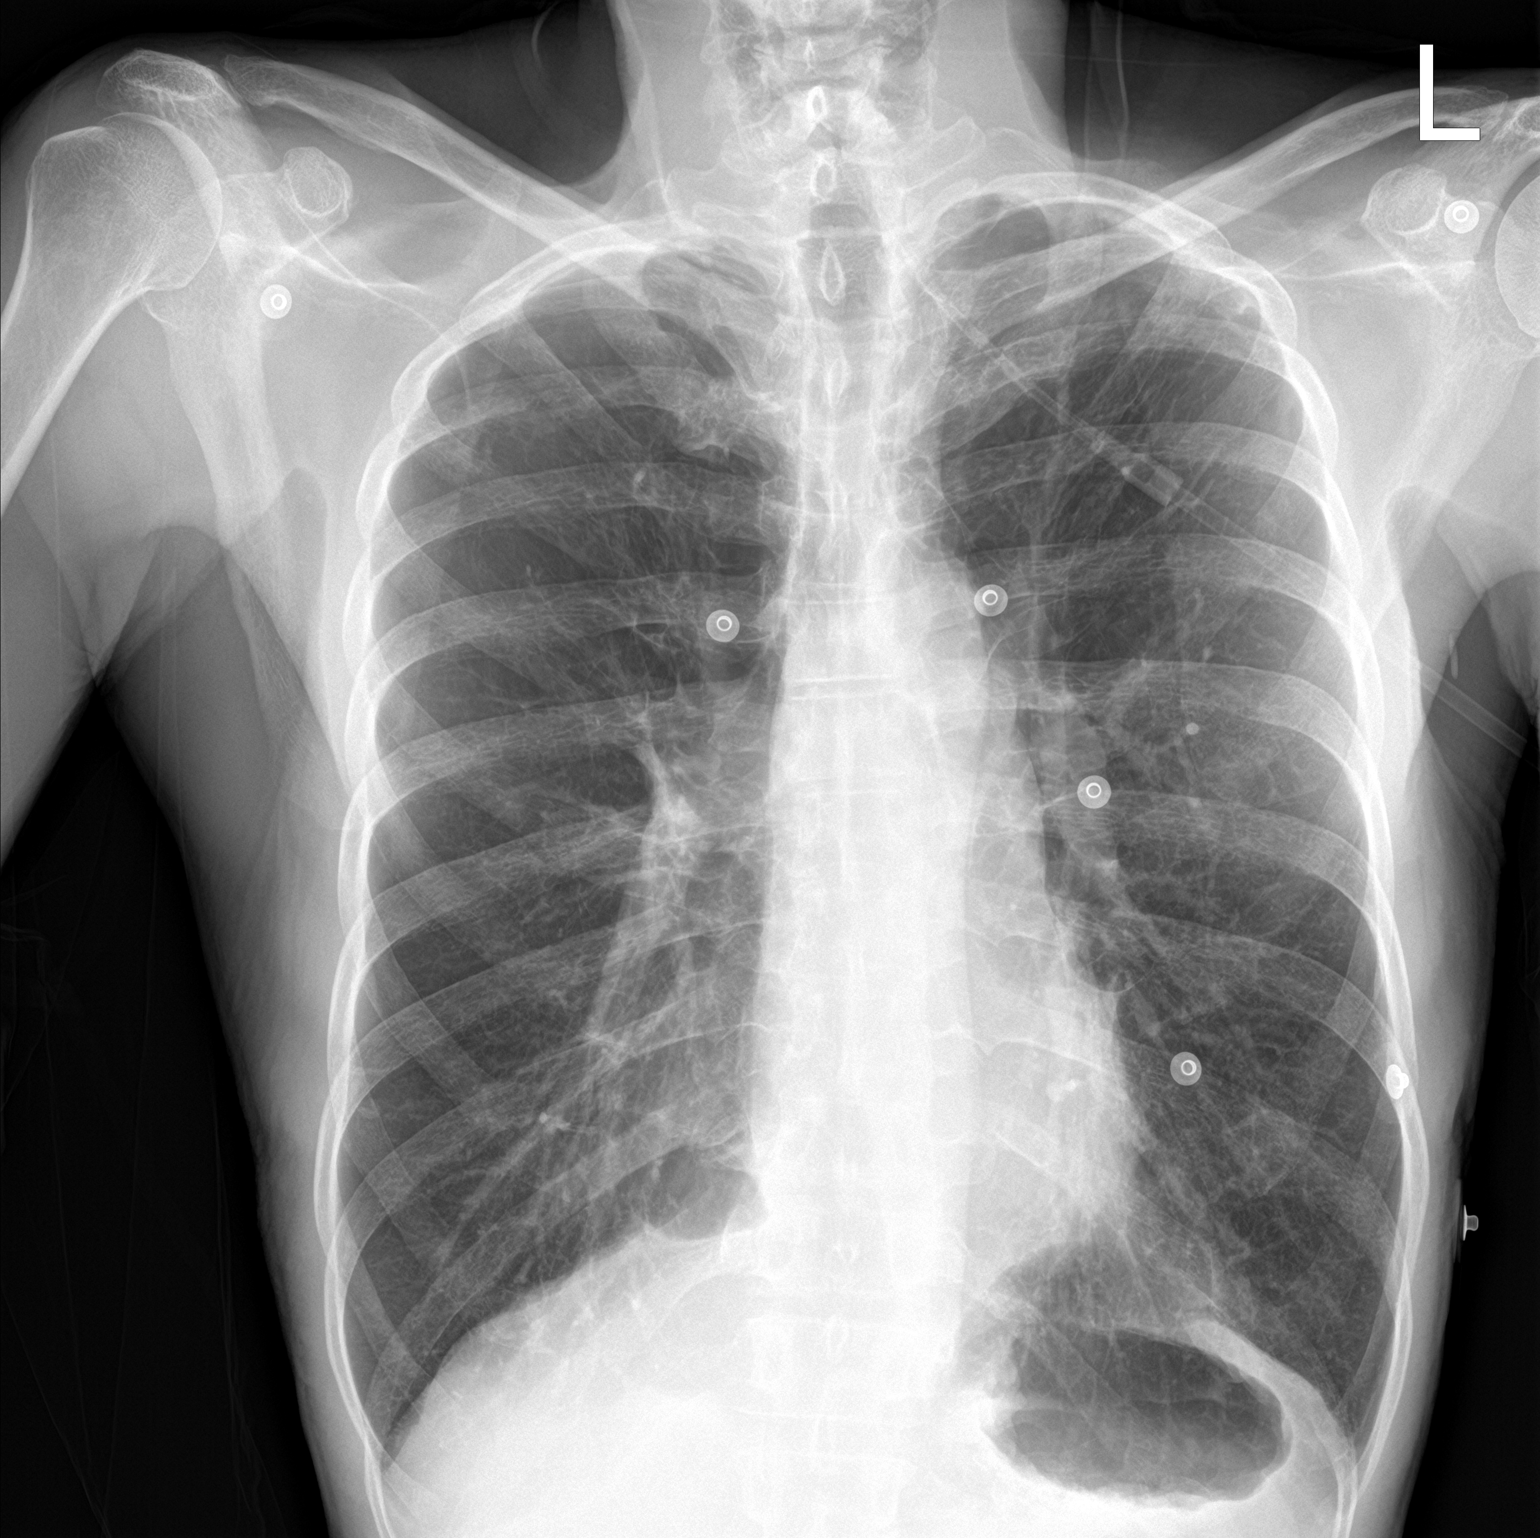

[chest lat]
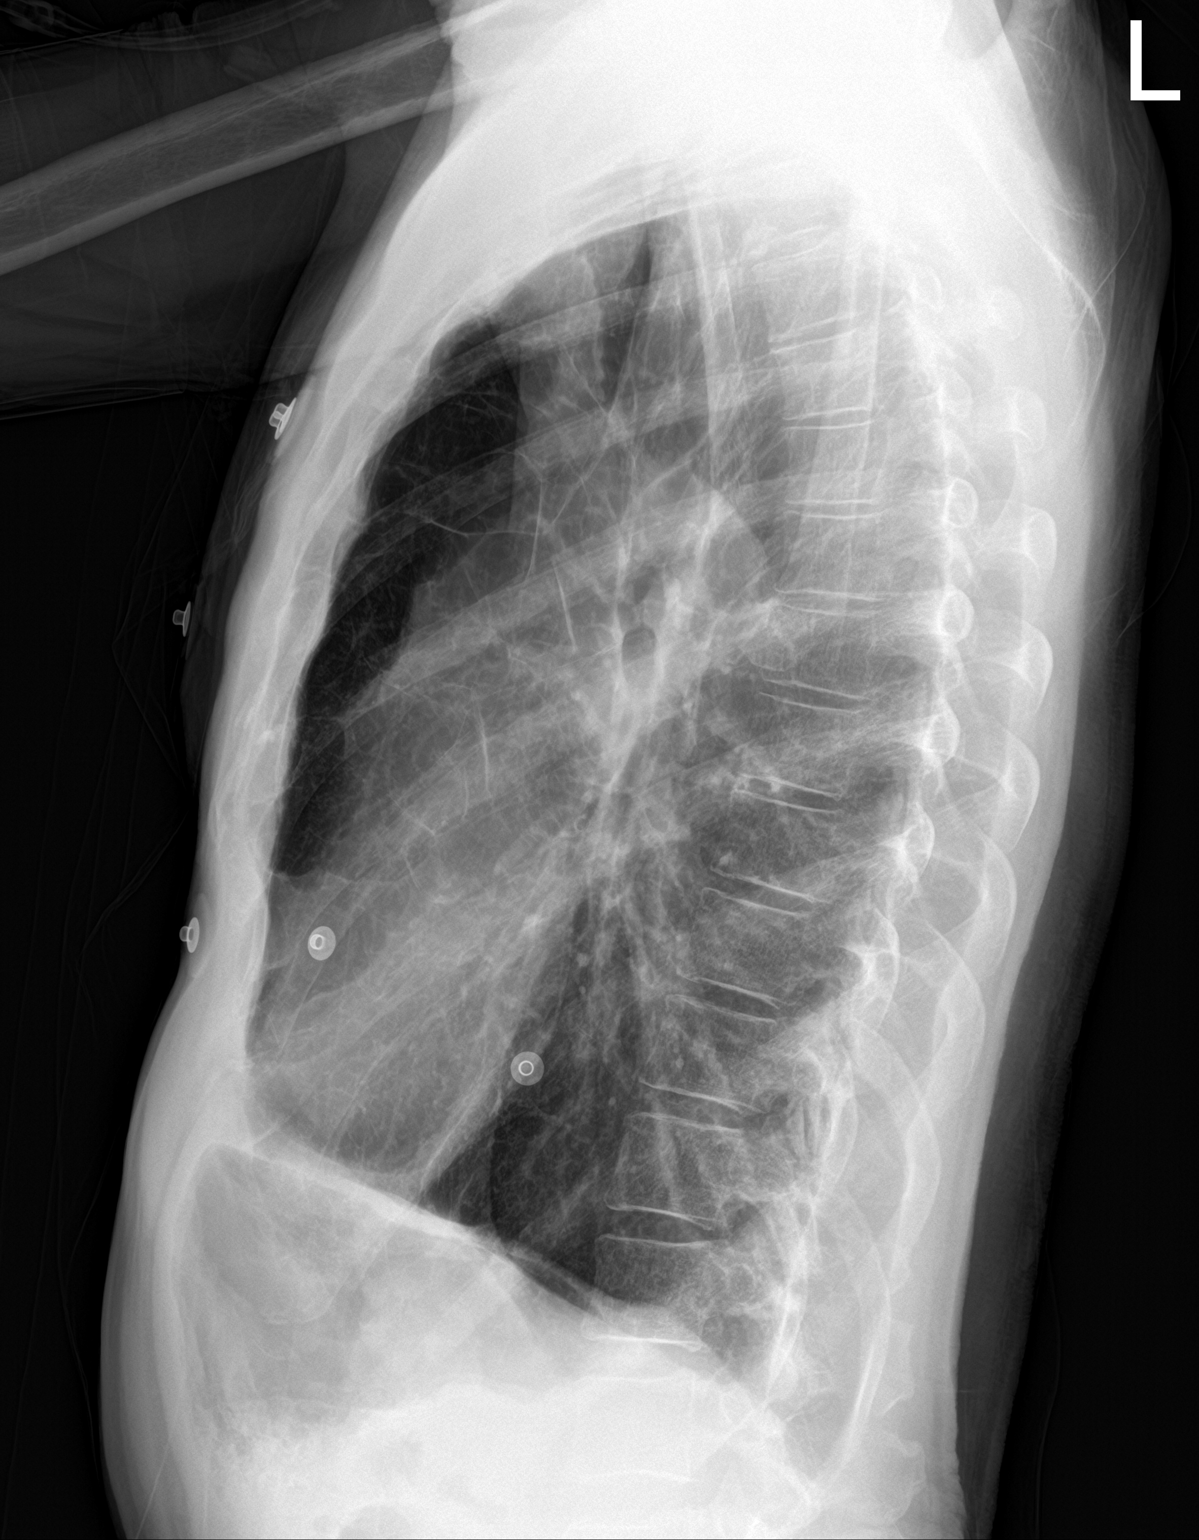

[2 of 2 positions shown; findings below may reference images not displayed]

FINDINGS: Hyperinflation with emphysematous disease. Scarring at the apical
left lung. No acute consolidation or pleural effusion. Normal
cardiomediastinal silhouette. No pneumothorax
IMPRESSION: No active cardiopulmonary disease. Hyperinflation with emphysematous
disease.

## 2022-09-24 ENCOUNTER — Other Ambulatory Visit: Payer: Self-pay

## 2022-09-24 MED ORDER — TAMSULOSIN HCL 0.4 MG PO CAPS: 0.4000 mg | ORAL_CAPSULE | Freq: Every day | ORAL | 3 refills | Status: AC

## 2022-10-03 ENCOUNTER — Other Ambulatory Visit: Payer: Self-pay | Admitting: Family Medicine

## 2022-10-03 DIAGNOSIS — I700 Atherosclerosis of aorta: Secondary | ICD-10-CM

## 2022-10-03 DIAGNOSIS — I679 Cerebrovascular disease, unspecified: Secondary | ICD-10-CM

## 2022-10-03 MED ORDER — CLOPIDOGREL BISULFATE 75 MG PO TABS: ORAL_TABLET | ORAL | 0 refills | Status: AC

## 2022-10-03 NOTE — Telephone Encounter (Signed)
Requested Prescriptions  Pending Prescriptions Disp Refills   clopidogrel (PLAVIX) 75 MG tablet 90 tablet 0     Hematology: Antiplatelets - clopidogrel Failed - 10/03/2022  9:43 AM      Failed - HCT in normal range and within 180 days    HCT  Date Value Ref Range Status  05/17/2022 34.7 (L) 39.0 - 52.0 % Final   Hematocrit  Date Value Ref Range Status  04/25/2022 38.8 37.5 - 51.0 % Final         Failed - HGB in normal range and within 180 days    Hemoglobin  Date Value Ref Range Status  05/17/2022 11.0 (L) 13.0 - 17.0 g/dL Final  40/98/1191 47.8 13.0 - 17.7 g/dL Final         Passed - PLT in normal range and within 180 days    Platelets  Date Value Ref Range Status  05/17/2022 186 150 - 400 K/uL Final  04/25/2022 194 150 - 450 x10E3/uL Final         Passed - Cr in normal range and within 360 days    Creatinine, Ser  Date Value Ref Range Status  05/17/2022 1.14 0.61 - 1.24 mg/dL Final         Passed - Valid encounter within last 6 months    Recent Outpatient Visits           5 months ago Stage 4 very severe COPD by GOLD classification (HCC)   [facility]   6 months ago Chronic folliculitis   [place] [facility]   7 months ago Seborrheic keratoses   [place] [facility]   8 months ago Hyperlipidemia, unspecified hyperlipidemia type   [facility]   10 months ago Stage 4 very severe COPD by GOLD classification [facility])   [place] [facility], [facility]

## 2022-10-03 NOTE — Telephone Encounter (Signed)
Medication Refill - Medication: generic Plavix 75 mg  Has the patient contacted their pharmacy? Yes.    (Agent: If no, request that the patient contact the pharmacy for the refill. If patient does not wish to contact the pharmacy document the reason why and proceed with request.) (Agent: If yes, when and what did the pharmacy advise?)  Preferred Pharmacy (with phone number or street name): [facility]/// [address] [place] [address]  Has the patient been seen for an appointment in the last year OR does the patient have an upcoming appointment? Yes.    Agent: Please be advised that RX refills may take up to 3 business days. We ask that you follow-up with your pharmacy.

## 2022-11-18 IMAGING — CR DG CHEST 2V
2 series · 2 of 2 positions shown · non-contrast
Comparison: October 29, 2020 chest radiograph; chest CT July 05, 2020

CLINICAL DATA: Shortness of breath

EXAM:
CHEST - 2 VIEW

[chest pa]
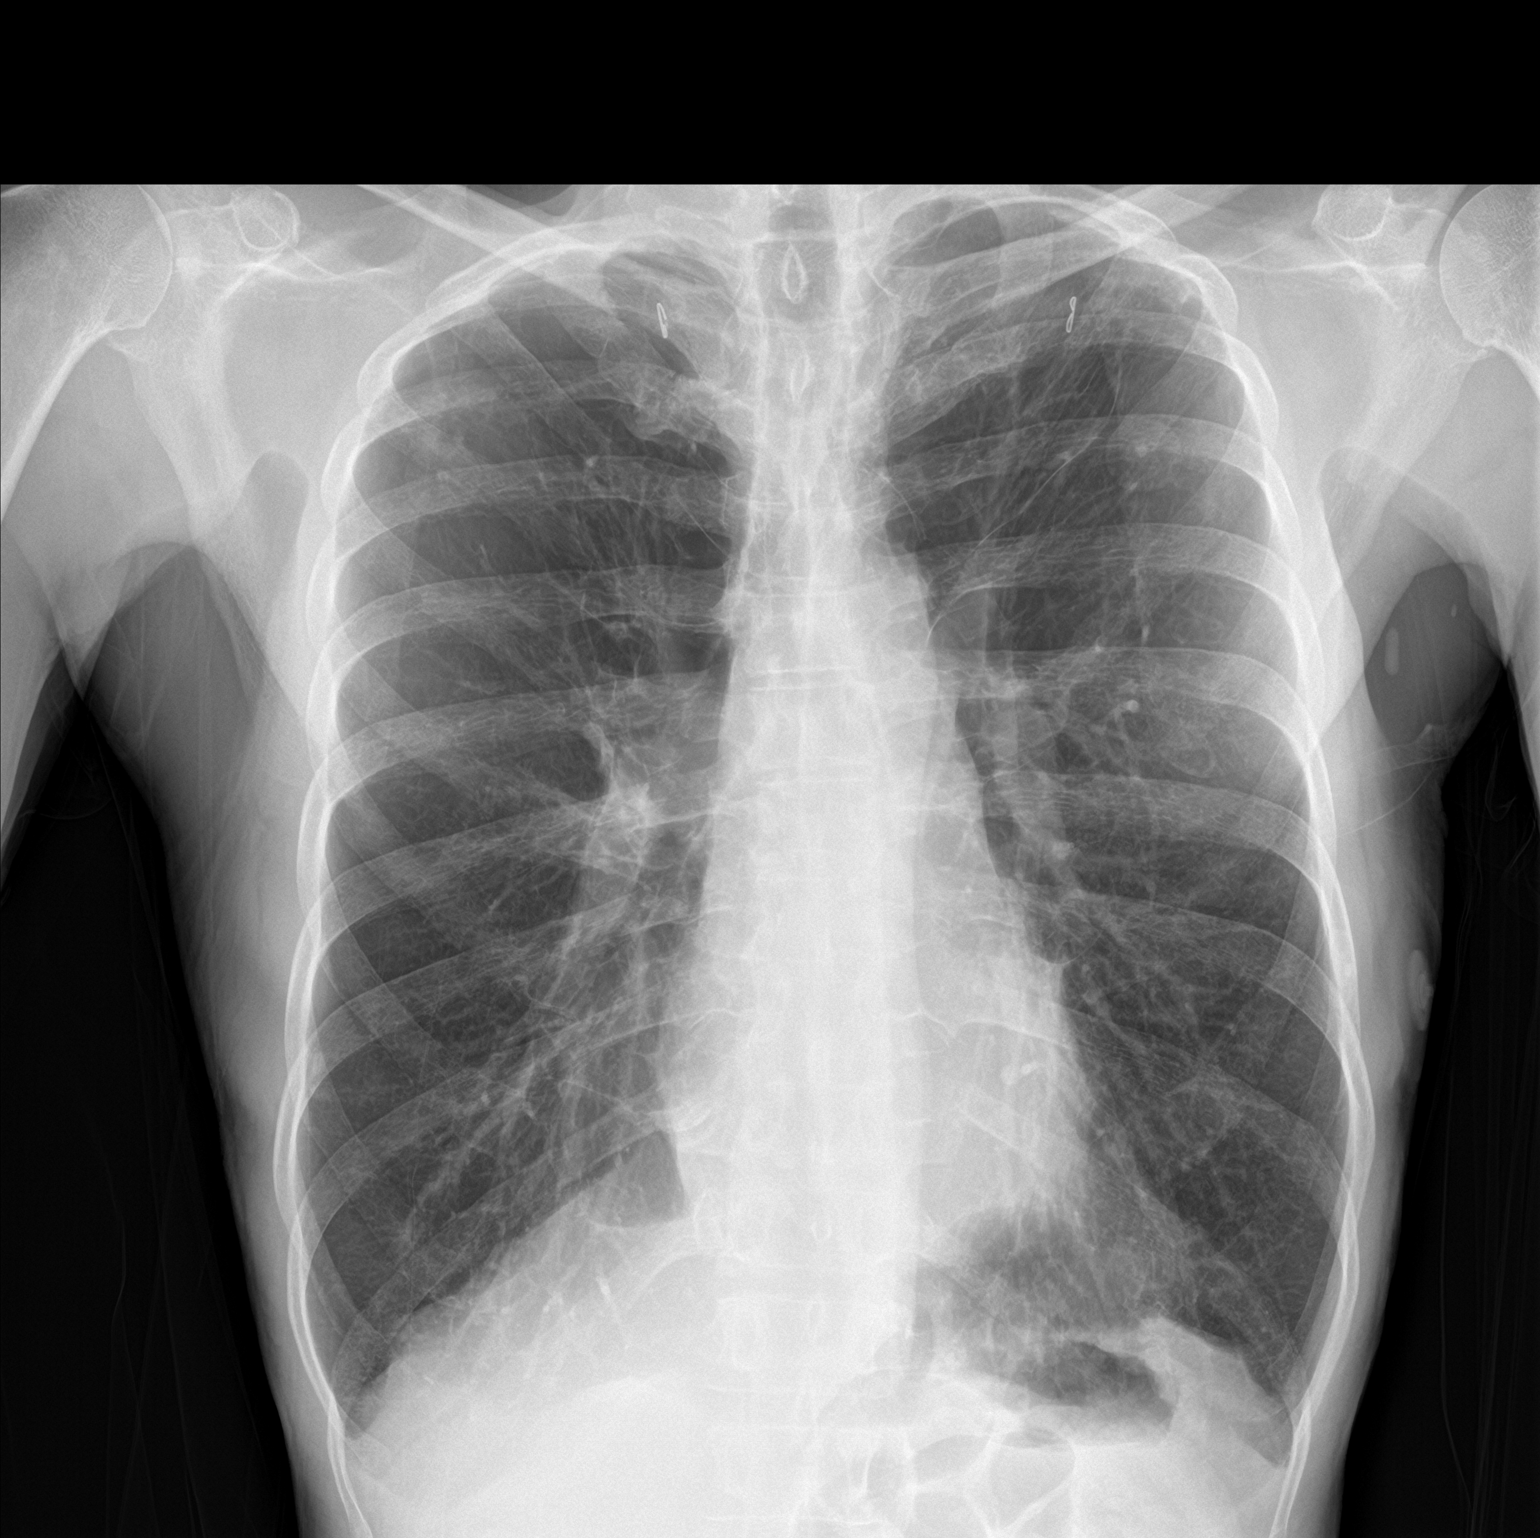

[chest lat]
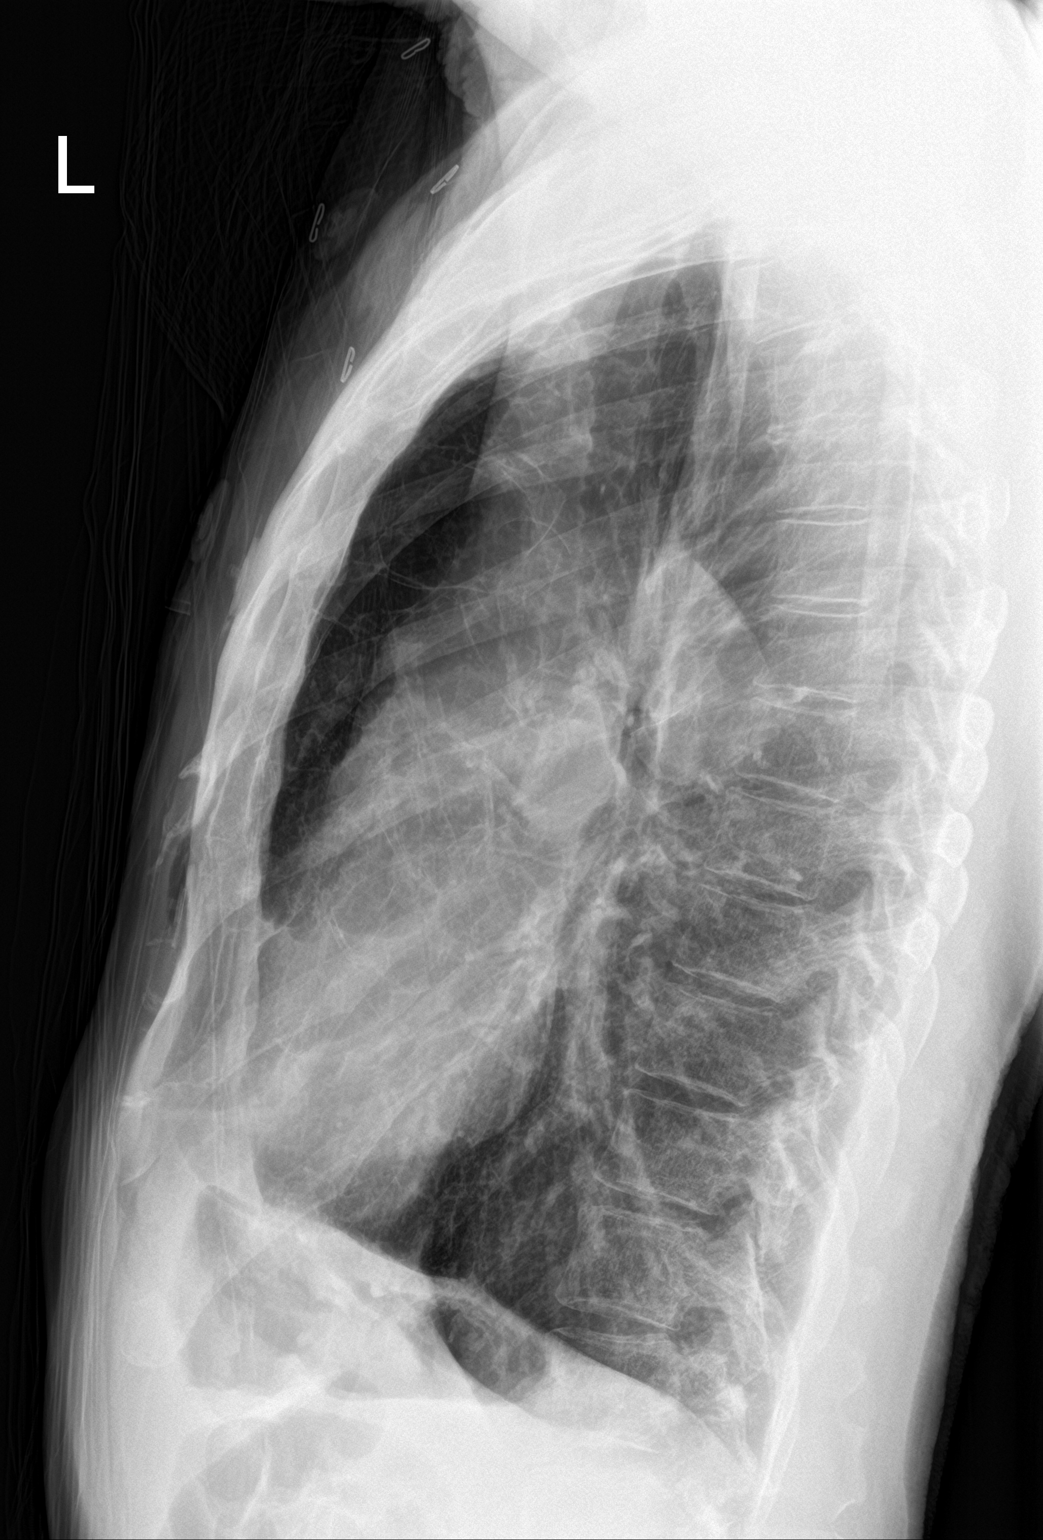

[2 of 2 positions shown; findings below may reference images not displayed]

FINDINGS: Lungs are hyperexpanded. Underlying emphysematous change again noted
with scarring in the upper lobes, more severe on the left than on
the right, stable. There is no appreciable edema or airspace
opacity. The heart size is normal. The pulmonary vascularity
reflects the underlying emphysema. No adenopathy. No bone lesions.
Skin staples overlie the upper chest.
IMPRESSION: Underlying emphysematous change with areas of scarring, stable. No
edema or airspace opacity. Stable cardiac silhouette. Pulmonary
vascularity reflects underlying emphysema. No adenopathy.

Emphysema ([id number]).

## 2022-12-29 ENCOUNTER — Ambulatory Visit: Payer: Medicare HMO | Admitting: Urology

## 2023-01-09 ENCOUNTER — Telehealth: Payer: Self-pay

## 2023-01-09 NOTE — Telephone Encounter (Signed)
Patient called stating he has moved to [place] and sees urologist there. He has some concerns and wanted to see of Nathaniel Garcia would be willing to speak with him to consult on some things this provider told him, he is not sure if he should fine another urologist or what to do. He trusted and still trusts [facility] and her feedback and would like to speak to her if at all possible for a few minutes.

## 2023-01-09 NOTE — Telephone Encounter (Signed)
Advised, and phone number is [phone]

## 2023-01-16 DIAGNOSIS — R8290 Unspecified abnormal findings in urine: Secondary | ICD-10-CM | POA: Insufficient documentation

## 2023-01-16 DIAGNOSIS — R339 Retention of urine, unspecified: Secondary | ICD-10-CM | POA: Insufficient documentation

## 2023-01-17 DIAGNOSIS — R310 Gross hematuria: Secondary | ICD-10-CM | POA: Insufficient documentation

## 2023-06-19 ENCOUNTER — Other Ambulatory Visit: Payer: Self-pay | Admitting: Urology

## 2023-06-23 DIAGNOSIS — F410 Panic disorder [episodic paroxysmal anxiety] without agoraphobia: Secondary | ICD-10-CM | POA: Insufficient documentation

## 2023-06-23 DIAGNOSIS — N138 Other obstructive and reflux uropathy: Secondary | ICD-10-CM | POA: Insufficient documentation

## 2023-06-23 DIAGNOSIS — K219 Gastro-esophageal reflux disease without esophagitis: Secondary | ICD-10-CM | POA: Insufficient documentation

## 2023-06-23 DIAGNOSIS — K5909 Other constipation: Secondary | ICD-10-CM | POA: Insufficient documentation

## 2023-06-23 DIAGNOSIS — R634 Abnormal weight loss: Secondary | ICD-10-CM | POA: Insufficient documentation

## 2023-06-23 DIAGNOSIS — D225 Melanocytic nevi of trunk: Secondary | ICD-10-CM | POA: Insufficient documentation

## 2023-06-23 DIAGNOSIS — J431 Panlobular emphysema: Secondary | ICD-10-CM | POA: Insufficient documentation

## 2023-06-24 DIAGNOSIS — R7989 Other specified abnormal findings of blood chemistry: Secondary | ICD-10-CM | POA: Insufficient documentation

## 2023-06-27 DIAGNOSIS — E785 Hyperlipidemia, unspecified: Secondary | ICD-10-CM | POA: Insufficient documentation

## 2023-11-01 LAB — COLOGUARD

## 2023-11-26 LAB — COLOGUARD: COLOGUARD: NEGATIVE

## 2023-12-28 ENCOUNTER — Telehealth: Payer: Self-pay | Admitting: Family Medicine

## 2023-12-28 NOTE — Telephone Encounter (Signed)
 Pt is no longer under providers care pt needs to find a PCP in [place]  we can not send medication to anyone in a different state.  KP

## 2023-12-28 NOTE — Telephone Encounter (Signed)
 Patient called and advised, he verbalized understanding.

## 2023-12-28 NOTE — Telephone Encounter (Signed)
 Pt is calling in requesting Dr. Rochelle Chu give him a call. Pt says Dr. Rochelle Chu had prescribed him some blood thinners and he is currently in [place]  and wants to know if she can send him a new prescription. Pt is requesting Dr. Rochelle Chu call him - [phone]

## 2024-05-06 ENCOUNTER — Ambulatory Visit

## 2024-05-06 ENCOUNTER — Other Ambulatory Visit: Payer: Self-pay

## 2024-05-06 ENCOUNTER — Ambulatory Visit: Admission: EM | Admit: 2024-05-06 | Discharge: 2024-05-06 | Disposition: A | Discharge status: Home / Self Care

## 2024-05-06 ENCOUNTER — Encounter: Payer: Self-pay | Admitting: *Deleted

## 2024-05-06 DIAGNOSIS — M79641 Pain in right hand: Secondary | ICD-10-CM | POA: Diagnosis not present

## 2024-05-06 DIAGNOSIS — S80811A Abrasion, right lower leg, initial encounter: Secondary | ICD-10-CM

## 2024-05-06 DIAGNOSIS — W19XXXA Unspecified fall, initial encounter: Secondary | ICD-10-CM | POA: Diagnosis not present

## 2024-05-06 NOTE — ED Provider Notes (Signed)
 [facility]    CSN: [id number] Arrival date & time: 05/06/24  1412      History   Chief Complaint Chief Complaint  Patient presents with   Fall    HPI Nathaniel Garcia is a 78 y.o. male.  Patient with past history significant for history of CVA, hyperlipidemia, emphysema, COPD presents today following a fall.  He reports that he was trying to stand up after sitting on the porch and he fell forward striking his right shin and his left hand on the ground.  Endorsing some pain to these areas with a small abrasion to the right shin.  Up-to-date on Tdap.   Fall    Past Medical History:  Diagnosis Date   COPD (chronic obstructive pulmonary disease) (HCC)    Hematuria    Hyperlipidemia    Stroke [facility])     Patient Active Problem List   Diagnosis Date Noted   Hyperlipidemia 06/27/2023   Elevated troponin 06/24/2023   BPH with obstruction/lower urinary tract symptoms 06/23/2023   Generalized anxiety disorder with panic attacks 06/23/2023   GERD (gastroesophageal reflux disease) 06/23/2023   Intermittent constipation 06/23/2023   Unintentional weight loss 06/23/2023   Irritated nevus of lower back 06/23/2023   Panlobular emphysema (HCC) 06/23/2023   Gross hematuria 01/17/2023   Abnormal urinalysis 01/16/2023   Incomplete emptying of bladder 01/16/2023   Chronic respiratory failure with hypoxia (HCC)    Current chronic use of systemic steroids    Lower extremity edema    Coronary artery disease involving coronary bypass graft of native heart without angina pectoris    Junctional escape rhythm (HCC) 10/30/2021   Abnormal EKG 10/30/2021   Acute and chronic respiratory failure with hypoxia (HCC) 10/30/2021   COPD with acute exacerbation (HCC) 01/03/2021   Bullous emphysema (HCC) 01/03/2021   Acute exacerbation of chronic obstructive pulmonary disease (COPD) (HCC) 10/31/2020   COPD exacerbation (HCC) 10/29/2020   Acute respiratory failure with hypoxia  (HCC) 10/29/2020   History of stroke 10/29/2020   Aortic atherosclerosis (HCC) 12/30/2018   Benign hematuria 09/18/2016   Weakness of left hand 07/02/2016   Cerebral vascular disease 09/14/2015   Carotid artery narrowing 08/15/2015   Bilateral carotid artery stenosis 08/15/2015   Bradycardia 08/01/2015   H/O transient cerebral ischemia 08/01/2015   Combined fat and carbohydrate induced hyperlipemia 08/01/2015   Breathlessness on exertion 07/30/2015   Absolute anemia 04/03/2015   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   Cerebral vascular accident (HCC) 04/03/2015   HLD (hyperlipidemia) 04/03/2015   Blood glucose elevated 04/03/2015   Lung nodule, solitary 04/03/2015   Non compliance with medical treatment 04/03/2015   Lung nodule, multiple 04/03/2015   Cerebral infarction (HCC) 04/03/2015   Abnormal lung field 04/03/2015   Uncomplicated alcohol dependence (HCC) 04/03/2015    Past Surgical History:  Procedure Laterality Date   COLONOSCOPY  2011   normal- [facility] docs       Home Medications    Prior to Admission medications   Medication Sig Start Date End Date Taking? Authorizing Provider  albuterol  (VENTOLIN  HFA) 108 (90 Base) MCG/ACT inhaler Inhale 2 puffs into the lungs every 6 (six) hours as needed for wheezing or shortness of breath. Patient taking differently: Inhale 2 puffs into the lungs as needed for shortness of breath. 10/09/21  Yes Clarise Crooks, MD  azithromycin  (ZITHROMAX ) 250 MG tablet Take 250 mg by mouth as directed. 01/06/24  Yes [provider]  benzonatate  (TESSALON ) 200 MG capsule Take 200 mg  by mouth daily as needed. 11/30/23  Yes [provider]  clopidogrel  (PLAVIX ) 75 MG tablet TAKE 1 TABLET(75 MG) BY MOUTH DAILY 10/03/22  Yes Clarise Crooks, MD  predniSONE  (DELTASONE ) 10 MG tablet Take 10 mg by mouth daily. 04/17/22  Yes [provider]  TRELEGY ELLIPTA  100-62.5-25 MCG/ACT AEPB Inhale 1 puff into the lungs daily. 03/02/24  Yes  [provider]  ALLERGY RELIEF 180 MG tablet Take 180 mg by mouth daily as needed. 11/30/23   [provider]  ALPRAZolam (XANAX) 0.25 MG tablet Take 0.25 mg by mouth 3 (three) times daily as needed. Patient not taking: Reported on 07/02/2022 06/20/22   [provider]  amoxicillin -clavulanate (AUGMENTIN) 875-125 MG tablet Take 1 tablet by mouth 2 (two) times daily. 04/15/24   [provider]  atorvastatin  (LIPITOR ) 80 MG tablet Take 1 tablet (80 mg total) by mouth daily. 01/10/22   Clarise Crooks, MD  budesonide  (PULMICORT ) 0.25 MG/2ML nebulizer solution Use 1 vial (0.25 mg total) by nebulization 2 (two) times daily. Patient taking differently: Take 0.25 mg by nebulization in the morning and at bedtime. When able to remember 11/14/21   Clarise Crooks, MD  cephALEXin  (KEFLEX ) 500 MG capsule Take 500 mg by mouth 2 (two) times daily. 03/25/24   [provider]  clindamycin (CLEOCIN T) 1 % lotion Apply 1 Application topically 2 (two) times daily. 12/23/22   [provider]  Docusate Sodium 100 MG capsule Take 100 mg by mouth at bedtime as needed. 02/22/24   [provider]  hydrocortisone 2.5 % ointment Apply 1 g topically 2 (two) times daily. 12/23/22   [provider]  ipratropium-albuterol  (DUONEB) 0.5-2.5 (3) MG/3ML SOLN Use 1 vial by nebulization 4 (four) times daily. Patient taking differently: Take 3 mLs by nebulization in the morning and at bedtime. When able to remember 11/01/21   Cresenzo, John V, MD  ketoconazole (NIZORAL) 2 % cream Apply 1 Application topically daily. 03/25/24   [provider]  lactulose (CHRONULAC) 10 GM/15ML solution Take 15 mLs by mouth at bedtime. 04/18/24   [provider]  loratadine (CLARITIN) 10 MG tablet Take 10 mg by mouth daily. 01/01/24   [provider]  mometasone  (ELOCON ) 0.1 % ointment Apply 1 Application topically daily. To groin area    [provider]   montelukast  (SINGULAIR ) 10 MG tablet Take 1 tablet (10 mg total) by mouth at bedtime. 01/10/22   Clarise Crooks, MD  mupirocin  ointment (BACTROBAN ) 2 % Apply 1 application. topically 2 (two) times daily. 03/27/22   Clarise Crooks, MD  omeprazole (PRILOSEC) 40 MG capsule Take 40 mg by mouth daily.    [provider]  polyethylene glycol powder (GLYCOLAX /MIRALAX ) 17 GM/SCOOP powder Take 17 g by mouth daily.    [provider]  sertraline  (ZOLOFT ) 25 MG tablet Take 1 tablet (25 mg total) by mouth at bedtime. 11/01/21   Cresenzo, John V, MD  tamsulosin  (FLOMAX ) 0.4 MG CAPS capsule Take 1 capsule (0.4 mg total) by mouth daily. TAKE 1 CAPSULE(0.4 MG) BY MOUTH DAILY 09/24/22   Oda Bence, PA-C    Family History Family History  Problem Relation Age of Onset   Prostate cancer Neg Hx     Social History Social History   Tobacco Use   Smoking status: Former    Current packs/day: 0.00    Average packs/day: (1.2 ttl pk-yrs)    Types: Cigarettes    Start date: 10/26/1960  Quit date: 10/26/2020    Years since quitting: 3.5    Passive exposure: Past   Smokeless tobacco: Never   Tobacco comments:    [place] smoking cessation information provided  Vaping Use   Vaping status: Never Used  Substance Use Topics   Alcohol use: Yes    Alcohol/week: 2.0 standard drinks of alcohol    Types: 2 Cans of beer per week    Comment: 2 daily at the most   Drug use: Not Currently    Comment: marijuana     Allergies   Hctz [hydrochlorothiazide ]   Review of Systems Review of Systems  Musculoskeletal:        Hand pain, leg pain  Skin:  Positive for wound.  All other systems reviewed and are negative.    Physical Exam Triage Vital Signs ED Triage Vitals  Encounter Vitals Group     BP 05/06/24 1443 118/70     Girls Systolic BP Percentile --      Girls Diastolic BP Percentile --      Boys Systolic BP Percentile --      Boys Diastolic BP Percentile --      Pulse  Rate 05/06/24 1443 70     Resp 05/06/24 1443 (!) 24     Temp 05/06/24 1443 98 F (36.7 C)     Temp Source 05/06/24 1443 Oral     SpO2 05/06/24 1443 98 %     Weight --      Height --      Head Circumference --      Peak Flow --      Pain Score 05/06/24 1440 3     Pain Loc --      Pain Education --      Exclude from Growth Chart --    No data found.  Updated Vital Signs BP 118/70 (BP Location: Left Arm)   Pulse 70   Temp 98 F (36.7 C) (Oral)   Resp (!) 24   SpO2 98%   Visual Acuity Right Eye Distance:   Left Eye Distance:   Bilateral Distance:    Right Eye Near:   Left Eye Near:    Bilateral Near:     Physical Exam Vitals and nursing note reviewed.  Constitutional:      General: He is not in acute distress.    Appearance: He is well-developed.  HENT:     Head: Normocephalic and atraumatic.   Eyes:     Conjunctiva/sclera: Conjunctivae normal.    Cardiovascular:     Rate and Rhythm: Normal rate and regular rhythm.     Heart sounds: No murmur heard. Pulmonary:     Effort: Pulmonary effort is normal. No respiratory distress.     Breath sounds: Normal breath sounds.  Abdominal:     Palpations: Abdomen is soft.     Tenderness: There is no abdominal tenderness.   Musculoskeletal:        General: No swelling.     Cervical back: Neck supple.     Comments: Tenderness to palpation of the right hand and the right anterior leg.  Range of motion unremarkable.   Skin:    General: Skin is warm and dry.     Capillary Refill: Capillary refill takes less than 2 seconds.     Comments: 3 small superficial abrasions to the anterior left leg.  Minimal bleeding.  No wound requiring repair.   Neurological:     Mental Status: He is alert.  Psychiatric:        Mood and Affect: Mood normal.      UC Treatments / Results  Labs (all labs ordered are listed, but only abnormal results are displayed) Labs Reviewed - No data to display  EKG   Radiology No results  found.  Procedures Procedures (including critical care time)  Medications Ordered in UC Medications - No data to display  Initial Impression / Assessment and Plan / UC Course  I have reviewed the triage vital signs and the nursing notes.  Pertinent labs & imaging results that were available during my care of the patient were reviewed by me and considered in my medical decision making (see chart for details).     This patient presents to the ED for concern of fall.  Differential diagnosis includes patellar injury, knee effusion, head injury, back abrasion   Imaging Studies ordered:  I ordered imaging studies including x-ray tibia/fibula left, x-ray right hand I independently visualized and interpreted imaging which showed negative for any acute findings. I agree with the radiologist interpretation   Problem List / ED Course:  Patient presents to the urgent care today with concerns of a fall.  Reports that he tripped over a stair earlier today in which he ended up sustaining an abrasion to the left leg.  No reported head injury or head strike.  Also endorses pain to the right hand after reaching out to brace his fall.  He does take blood thinners but denies head injury or head impact.  Denies any headache, nausea, vomiting, vision changes. On exam, patient has small abrasions to the anterior left leg that are weeping but no significant bleeding seen.  No repairable lesions.  Right hand is unremarkable in terms of any swelling or deformities.  There are some tenderness overlying the 4th and 5th digits but no bruising present.  Doubtful of fracture with x-ray imaging ordered for evaluation of possible injuries. My personal interpretation of the x-rays of the left leg and the right hand are unremarkable for any acute fractures or other traumatic findings.  Will discharge home with instructions that formal radiology read may modify current treatment plan.  The left leg was wrapped with a  nonocclusive dressing.  Advised to return for any concerns of infection in this leg.  Otherwise stable this time for outpatient follow-up and discharged home.   Final Clinical Impressions(s) / UC Diagnoses   Final diagnoses:  Fall, initial encounter  Abrasion of right lower extremity, initial encounter  Right hand pain     Discharge Instructions      You are seen in the urgent care today for concerns of a fall.  Your x-rays of your leg and hand were thankfully negative.  Given your reassuring exam, I do suspect you likely have small abrasions to the right leg that will heal on their own.  Keep these wounds clean and dry.  For any concerns infection that develop, return to the urgent care or seek medical evaluation.  For the pain you may experience, take Tylenol  ibuprofen .  Keep your legs elevated and use ice to help reduce of any swelling from the injury.     ED Prescriptions   None    PDMP not reviewed this encounter.   Channon Ambrosini A, PA-C 05/06/24 1659

## 2024-05-06 NOTE — Discharge Instructions (Signed)
 You are seen in the urgent care today for concerns of a fall.  Your x-rays of your leg and hand were thankfully negative.  Given your reassuring exam, I do suspect you likely have small abrasions to the right leg that will heal on their own.  Keep these wounds clean and dry.  For any concerns infection that develop, return to the urgent care or seek medical evaluation.  For the pain you may experience, take Tylenol  ibuprofen .  Keep your legs elevated and use ice to help reduce of any swelling from the injury.

## 2024-05-06 NOTE — ED Triage Notes (Signed)
 Pt reports he was standing up from chair on porch and missed a step and fell landing on concrete. Abrasions noted to left shin. Also c/o pain in right hand and left elbow. Reports last tetanus 6-8 months ago

## 2024-05-06 NOTE — ED Triage Notes (Signed)
 TETANUS/TDAP 01/31/2023    Per previous progress note.

## 2024-05-31 ENCOUNTER — Telehealth: Payer: Self-pay

## 2024-05-31 NOTE — Telephone Encounter (Signed)
 Pt called the triage line stating he has a tear on his penis. States we prescribed a topical cream a couple of years ago and would like for us  to send it again. Called pt to triage call, unable to LVM.
